# Patient Record
Sex: Female | Born: 1935 | Race: White | Hispanic: No | Smoking: Never smoker
Health system: Southern US, Community
[De-identification: ages and names within clinical notes are randomized; demographics above are authoritative.]

## PROBLEM LIST (undated history)

## (undated) DIAGNOSIS — I10 Essential (primary) hypertension: Secondary | ICD-10-CM

## (undated) DIAGNOSIS — K642 Third degree hemorrhoids: Secondary | ICD-10-CM

## (undated) DIAGNOSIS — E785 Hyperlipidemia, unspecified: Secondary | ICD-10-CM

## (undated) DIAGNOSIS — Z9989 Dependence on other enabling machines and devices: Secondary | ICD-10-CM

## (undated) DIAGNOSIS — R600 Localized edema: Secondary | ICD-10-CM

## (undated) DIAGNOSIS — Z8719 Personal history of other diseases of the digestive system: Secondary | ICD-10-CM

## (undated) DIAGNOSIS — M797 Fibromyalgia: Secondary | ICD-10-CM

## (undated) DIAGNOSIS — Z853 Personal history of malignant neoplasm of breast: Secondary | ICD-10-CM

## (undated) DIAGNOSIS — Z9889 Other specified postprocedural states: Secondary | ICD-10-CM

## (undated) DIAGNOSIS — F0391 Unspecified dementia with behavioral disturbance: Secondary | ICD-10-CM

## (undated) DIAGNOSIS — K602 Anal fissure, unspecified: Secondary | ICD-10-CM

## (undated) DIAGNOSIS — F09 Unspecified mental disorder due to known physiological condition: Secondary | ICD-10-CM

## (undated) DIAGNOSIS — Z923 Personal history of irradiation: Secondary | ICD-10-CM

## (undated) DIAGNOSIS — G4733 Obstructive sleep apnea (adult) (pediatric): Secondary | ICD-10-CM

## (undated) DIAGNOSIS — Z859 Personal history of malignant neoplasm, unspecified: Secondary | ICD-10-CM

## (undated) DIAGNOSIS — F03918 Unspecified dementia, unspecified severity, with other behavioral disturbance: Secondary | ICD-10-CM

## (undated) DIAGNOSIS — M1990 Unspecified osteoarthritis, unspecified site: Secondary | ICD-10-CM

## (undated) DIAGNOSIS — I8390 Asymptomatic varicose veins of unspecified lower extremity: Secondary | ICD-10-CM

## (undated) DIAGNOSIS — K219 Gastro-esophageal reflux disease without esophagitis: Secondary | ICD-10-CM

## (undated) HISTORY — PX: RECTOCELE REPAIR: SHX761

## (undated) HISTORY — PX: ESOPHAGEAL DILATION: SHX303

## (undated) HISTORY — DX: Fibromyalgia: M79.7

## (undated) HISTORY — PX: HEMORRHOID BANDING: SHX5850

## (undated) HISTORY — DX: Unspecified osteoarthritis, unspecified site: M19.90

## (undated) HISTORY — DX: Anal fissure, unspecified: K60.2

## (undated) HISTORY — PX: COLONOSCOPY: SHX5424

## (undated) HISTORY — DX: Essential (primary) hypertension: I10

## (undated) HISTORY — DX: Hyperlipidemia, unspecified: E78.5

---

## 1974-06-07 HISTORY — PX: TOTAL ABDOMINAL HYSTERECTOMY: SHX209

## 1998-04-08 ENCOUNTER — Emergency Department (HOSPITAL_COMMUNITY): Admission: EM | Admit: 1998-04-08 | Discharge: 1998-04-08 | Payer: Self-pay | Admitting: Emergency Medicine

## 1998-06-25 ENCOUNTER — Ambulatory Visit (HOSPITAL_COMMUNITY): Admission: RE | Admit: 1998-06-25 | Discharge: 1998-06-25 | Payer: Self-pay | Admitting: *Deleted

## 1998-06-25 ENCOUNTER — Encounter: Payer: Self-pay | Admitting: *Deleted

## 1998-07-23 ENCOUNTER — Other Ambulatory Visit: Admission: RE | Admit: 1998-07-23 | Discharge: 1998-07-23 | Payer: Self-pay | Admitting: *Deleted

## 1998-08-14 ENCOUNTER — Ambulatory Visit (HOSPITAL_COMMUNITY): Admission: RE | Admit: 1998-08-14 | Discharge: 1998-08-14 | Payer: Self-pay | Admitting: Gastroenterology

## 1998-11-24 ENCOUNTER — Emergency Department (HOSPITAL_COMMUNITY): Admission: EM | Admit: 1998-11-24 | Discharge: 1998-11-24 | Payer: Self-pay | Admitting: Emergency Medicine

## 1998-11-24 ENCOUNTER — Encounter: Payer: Self-pay | Admitting: Emergency Medicine

## 1999-07-21 ENCOUNTER — Encounter: Payer: Self-pay | Admitting: *Deleted

## 1999-07-21 ENCOUNTER — Ambulatory Visit (HOSPITAL_COMMUNITY): Admission: RE | Admit: 1999-07-21 | Discharge: 1999-07-21 | Payer: Self-pay | Admitting: *Deleted

## 1999-08-25 ENCOUNTER — Other Ambulatory Visit: Admission: RE | Admit: 1999-08-25 | Discharge: 1999-08-25 | Payer: Self-pay | Admitting: *Deleted

## 2000-01-01 ENCOUNTER — Other Ambulatory Visit: Admission: RE | Admit: 2000-01-01 | Discharge: 2000-01-01 | Payer: Self-pay | Admitting: *Deleted

## 2000-05-16 ENCOUNTER — Other Ambulatory Visit: Admission: RE | Admit: 2000-05-16 | Discharge: 2000-05-16 | Payer: Self-pay | Admitting: *Deleted

## 2000-09-15 ENCOUNTER — Encounter: Payer: Self-pay | Admitting: *Deleted

## 2000-09-15 ENCOUNTER — Ambulatory Visit (HOSPITAL_COMMUNITY): Admission: RE | Admit: 2000-09-15 | Discharge: 2000-09-15 | Payer: Self-pay | Admitting: *Deleted

## 2000-11-30 ENCOUNTER — Ambulatory Visit (HOSPITAL_BASED_OUTPATIENT_CLINIC_OR_DEPARTMENT_OTHER): Admission: RE | Admit: 2000-11-30 | Discharge: 2000-11-30 | Payer: Self-pay | Admitting: Internal Medicine

## 2001-01-04 ENCOUNTER — Ambulatory Visit (HOSPITAL_BASED_OUTPATIENT_CLINIC_OR_DEPARTMENT_OTHER): Admission: RE | Admit: 2001-01-04 | Discharge: 2001-01-04 | Payer: Self-pay | Admitting: Internal Medicine

## 2001-02-08 ENCOUNTER — Other Ambulatory Visit: Admission: RE | Admit: 2001-02-08 | Discharge: 2001-02-08 | Payer: Self-pay | Admitting: *Deleted

## 2001-03-12 ENCOUNTER — Emergency Department (HOSPITAL_COMMUNITY): Admission: EM | Admit: 2001-03-12 | Discharge: 2001-03-12 | Payer: Self-pay | Admitting: Emergency Medicine

## 2001-12-18 ENCOUNTER — Ambulatory Visit (HOSPITAL_COMMUNITY): Admission: RE | Admit: 2001-12-18 | Discharge: 2001-12-18 | Payer: Self-pay | Admitting: *Deleted

## 2001-12-18 ENCOUNTER — Encounter: Payer: Self-pay | Admitting: *Deleted

## 2002-12-25 ENCOUNTER — Ambulatory Visit (HOSPITAL_COMMUNITY): Admission: RE | Admit: 2002-12-25 | Discharge: 2002-12-25 | Payer: Self-pay | Admitting: *Deleted

## 2002-12-25 ENCOUNTER — Encounter: Payer: Self-pay | Admitting: *Deleted

## 2003-04-16 ENCOUNTER — Other Ambulatory Visit: Admission: RE | Admit: 2003-04-16 | Discharge: 2003-04-16 | Payer: Self-pay | Admitting: *Deleted

## 2003-05-22 ENCOUNTER — Ambulatory Visit (HOSPITAL_COMMUNITY): Admission: RE | Admit: 2003-05-22 | Discharge: 2003-05-22 | Payer: Self-pay | Admitting: *Deleted

## 2004-01-19 ENCOUNTER — Emergency Department (HOSPITAL_COMMUNITY): Admission: EM | Admit: 2004-01-19 | Discharge: 2004-01-19 | Payer: Self-pay

## 2004-01-21 ENCOUNTER — Ambulatory Visit (HOSPITAL_COMMUNITY): Admission: RE | Admit: 2004-01-21 | Discharge: 2004-01-21 | Payer: Self-pay | Admitting: Internal Medicine

## 2004-02-07 ENCOUNTER — Ambulatory Visit (HOSPITAL_COMMUNITY): Admission: RE | Admit: 2004-02-07 | Discharge: 2004-02-07 | Payer: Self-pay | Admitting: *Deleted

## 2004-07-24 ENCOUNTER — Ambulatory Visit: Payer: Self-pay | Admitting: Ophthalmology

## 2004-08-03 ENCOUNTER — Ambulatory Visit: Payer: Self-pay | Admitting: Ophthalmology

## 2005-02-11 ENCOUNTER — Ambulatory Visit (HOSPITAL_COMMUNITY): Admission: RE | Admit: 2005-02-11 | Discharge: 2005-02-11 | Payer: Self-pay | Admitting: *Deleted

## 2005-02-23 ENCOUNTER — Encounter: Admission: RE | Admit: 2005-02-23 | Discharge: 2005-02-23 | Payer: Self-pay | Admitting: Internal Medicine

## 2005-06-07 HISTORY — PX: CHOLECYSTECTOMY: SHX55

## 2006-03-03 ENCOUNTER — Ambulatory Visit (HOSPITAL_COMMUNITY): Admission: RE | Admit: 2006-03-03 | Discharge: 2006-03-03 | Payer: Self-pay | Admitting: *Deleted

## 2006-06-03 ENCOUNTER — Emergency Department (HOSPITAL_COMMUNITY): Admission: EM | Admit: 2006-06-03 | Discharge: 2006-06-03 | Payer: Self-pay | Admitting: Emergency Medicine

## 2006-07-28 ENCOUNTER — Ambulatory Visit: Admission: RE | Admit: 2006-07-28 | Discharge: 2006-07-28 | Payer: Self-pay | Admitting: Internal Medicine

## 2007-03-06 ENCOUNTER — Ambulatory Visit (HOSPITAL_COMMUNITY): Admission: RE | Admit: 2007-03-06 | Discharge: 2007-03-06 | Payer: Self-pay | Admitting: Internal Medicine

## 2007-05-01 ENCOUNTER — Encounter: Admission: RE | Admit: 2007-05-01 | Discharge: 2007-05-01 | Payer: Self-pay | Admitting: Internal Medicine

## 2007-07-06 ENCOUNTER — Encounter (INDEPENDENT_AMBULATORY_CARE_PROVIDER_SITE_OTHER): Payer: Self-pay | Admitting: Interventional Radiology

## 2007-07-06 ENCOUNTER — Encounter: Admission: RE | Admit: 2007-07-06 | Discharge: 2007-07-06 | Payer: Self-pay | Admitting: Internal Medicine

## 2007-07-06 ENCOUNTER — Other Ambulatory Visit: Admission: RE | Admit: 2007-07-06 | Discharge: 2007-07-06 | Payer: Self-pay | Admitting: Interventional Radiology

## 2007-12-27 ENCOUNTER — Encounter: Admission: RE | Admit: 2007-12-27 | Discharge: 2007-12-27 | Payer: Self-pay | Admitting: Internal Medicine

## 2008-03-13 ENCOUNTER — Ambulatory Visit (HOSPITAL_COMMUNITY): Admission: RE | Admit: 2008-03-13 | Discharge: 2008-03-13 | Payer: Self-pay | Admitting: Internal Medicine

## 2008-04-17 ENCOUNTER — Encounter: Admission: RE | Admit: 2008-04-17 | Discharge: 2008-04-17 | Payer: Self-pay | Admitting: Interventional Radiology

## 2008-04-24 ENCOUNTER — Encounter: Admission: RE | Admit: 2008-04-24 | Discharge: 2008-04-24 | Payer: Self-pay | Admitting: Interventional Radiology

## 2008-05-07 ENCOUNTER — Encounter: Admission: RE | Admit: 2008-05-07 | Discharge: 2008-05-07 | Payer: Self-pay | Admitting: Internal Medicine

## 2008-05-14 ENCOUNTER — Encounter: Admission: RE | Admit: 2008-05-14 | Discharge: 2008-05-14 | Payer: Self-pay | Admitting: Internal Medicine

## 2008-06-07 HISTORY — PX: VARICOSE VEIN SURGERY: SHX832

## 2008-09-20 ENCOUNTER — Ambulatory Visit (HOSPITAL_COMMUNITY): Admission: RE | Admit: 2008-09-20 | Discharge: 2008-09-20 | Payer: Self-pay | Admitting: *Deleted

## 2008-10-22 ENCOUNTER — Encounter: Admission: RE | Admit: 2008-10-22 | Discharge: 2008-10-22 | Payer: Self-pay | Admitting: Interventional Radiology

## 2009-03-14 ENCOUNTER — Ambulatory Visit (HOSPITAL_COMMUNITY): Admission: RE | Admit: 2009-03-14 | Discharge: 2009-03-14 | Payer: Self-pay | Admitting: Neurology

## 2009-04-04 ENCOUNTER — Encounter: Admission: RE | Admit: 2009-04-04 | Discharge: 2009-04-04 | Payer: Self-pay | Admitting: Internal Medicine

## 2009-05-09 ENCOUNTER — Ambulatory Visit (HOSPITAL_COMMUNITY): Admission: RE | Admit: 2009-05-09 | Discharge: 2009-05-10 | Payer: Self-pay | Admitting: Surgery

## 2009-05-09 ENCOUNTER — Encounter (INDEPENDENT_AMBULATORY_CARE_PROVIDER_SITE_OTHER): Payer: Self-pay | Admitting: Surgery

## 2010-01-26 ENCOUNTER — Emergency Department (HOSPITAL_COMMUNITY): Admission: EM | Admit: 2010-01-26 | Discharge: 2010-01-27 | Payer: Self-pay | Admitting: Emergency Medicine

## 2010-03-18 ENCOUNTER — Ambulatory Visit (HOSPITAL_COMMUNITY): Admission: RE | Admit: 2010-03-18 | Discharge: 2010-03-18 | Payer: Self-pay | Admitting: Internal Medicine

## 2010-03-30 ENCOUNTER — Ambulatory Visit: Payer: Self-pay | Admitting: Cardiology

## 2010-06-27 ENCOUNTER — Encounter: Payer: Self-pay | Admitting: Orthopedic Surgery

## 2010-06-28 ENCOUNTER — Encounter: Payer: Self-pay | Admitting: Endocrinology

## 2010-06-28 ENCOUNTER — Encounter: Payer: Self-pay | Admitting: Orthopedic Surgery

## 2010-06-28 ENCOUNTER — Encounter: Payer: Self-pay | Admitting: Internal Medicine

## 2010-07-14 ENCOUNTER — Encounter (INDEPENDENT_AMBULATORY_CARE_PROVIDER_SITE_OTHER): Payer: Self-pay | Admitting: *Deleted

## 2010-07-23 NOTE — Letter (Signed)
Summary: New Patient letter  [facility]  [address] [place], [place] [address]   Phone: [phone]  Fax: [phone]       07/14/2010 MRN: [id number]  [facility] [address] [place], [place]  [address]  Dear [name],  Welcome to the Gastroenterology Division at [facility].    You are scheduled to see Dr.  Christella Hartigan on 08-21-10 at 11:00A.M. on the 3rd floor at [facility], [address]  We ask that you try to arrive at our office 15 minutes prior to your appointment time to allow for check-in.  We would like you to complete the enclosed self-administered evaluation form prior to your visit and bring it with you on the day of your appointment.  We will review it with you.  Also, please bring a complete list of all your medications or, if you prefer, bring the medication bottles and we will list them.  Please bring your insurance card so that we may make a copy of it.  If your insurance requires a referral to see a specialist, please bring your referral form from your primary care physician.  Co-payments are due at the time of your visit and may be paid by cash, check or credit card.     Your office visit will consist of a consult with your physician (includes a physical exam), any laboratory testing he/she may order, scheduling of any necessary diagnostic testing (e.g. x-ray, ultrasound, CT-scan), and scheduling of a procedure (e.g. Endoscopy, Colonoscopy) if required.  Please allow enough time on your schedule to allow for any/all of these possibilities.    If you cannot keep your appointment, please call [phone] to cancel or reschedule prior to your appointment date.  This allows [place] the opportunity to schedule an appointment for another patient in need of care.  If you do not cancel or reschedule by 5 p.m. the business day prior to your appointment date, you will be charged a $50.00 late cancellation/no-show fee.    Thank you for  choosing [facility] for your medical needs.  We appreciate the opportunity to care for you.  Please visit [place] at our website  to learn more about our practice.                     Sincerely,                                                             The Gastroenterology Division

## 2010-07-29 ENCOUNTER — Other Ambulatory Visit: Payer: Self-pay | Admitting: Gastroenterology

## 2010-07-30 ENCOUNTER — Ambulatory Visit
Admission: RE | Admit: 2010-07-30 | Discharge: 2010-07-30 | Disposition: A | Discharge status: Home / Self Care | Payer: Medicare Other | Source: Ambulatory Visit | Attending: Gastroenterology | Admitting: Gastroenterology

## 2010-07-30 MED ORDER — IOHEXOL 300 MG/ML  SOLN
100.0000 mL | Freq: Once | INTRAMUSCULAR | Status: AC | PRN
  Administered 2010-07-30: 100 mL via INTRAVENOUS

## 2010-08-13 ENCOUNTER — Ambulatory Visit: Payer: Self-pay | Admitting: Gastroenterology

## 2010-08-21 ENCOUNTER — Ambulatory Visit: Payer: Self-pay | Admitting: Gastroenterology

## 2010-09-08 LAB — COMPREHENSIVE METABOLIC PANEL
ALT: 15 U/L (ref 0–35)
AST: 18 U/L (ref 0–37)
Albumin: 3.6 g/dL (ref 3.5–5.2)
Calcium: 9.8 mg/dL (ref 8.4–10.5)
GFR calc Af Amer: >60 mL/min (ref >60)
Potassium: 4.6 mEq/L (ref 3.5–5.1)
Sodium: 142 mEq/L (ref 135–145)
Total Protein: 7 g/dL (ref 6.0–8.3)

## 2010-09-08 LAB — DIFFERENTIAL
Eosinophils Absolute: 0 10*3/uL (ref 0.0–0.7)
Eosinophils Relative: 1 % (ref 0–5)
Lymphs Abs: 2.1 10*3/uL (ref 0.7–4.0)
Monocytes Absolute: 0.5 10*3/uL (ref 0.1–1.0)
Monocytes Relative: 7 % (ref 3–12)

## 2010-09-08 LAB — CBC
MCHC: 33.9 g/dL (ref 30.0–36.0)
RBC: 4.32 MIL/uL (ref 3.87–5.11)
RDW: 12.8 % (ref 11.5–15.5)

## 2010-09-29 ENCOUNTER — Encounter: Payer: Self-pay | Admitting: *Deleted

## 2010-09-29 DIAGNOSIS — E7800 Pure hypercholesterolemia, unspecified: Secondary | ICD-10-CM

## 2010-09-29 DIAGNOSIS — I10 Essential (primary) hypertension: Secondary | ICD-10-CM

## 2010-09-29 DIAGNOSIS — Z87898 Personal history of other specified conditions: Secondary | ICD-10-CM | POA: Insufficient documentation

## 2010-09-29 DIAGNOSIS — K220 Achalasia of cardia: Secondary | ICD-10-CM | POA: Insufficient documentation

## 2010-09-29 DIAGNOSIS — E785 Hyperlipidemia, unspecified: Secondary | ICD-10-CM

## 2010-09-29 DIAGNOSIS — M797 Fibromyalgia: Secondary | ICD-10-CM | POA: Insufficient documentation

## 2010-10-01 ENCOUNTER — Encounter: Payer: Self-pay | Admitting: Cardiology

## 2010-10-01 ENCOUNTER — Ambulatory Visit (INDEPENDENT_AMBULATORY_CARE_PROVIDER_SITE_OTHER): Payer: Medicare Other | Admitting: Cardiology

## 2010-10-01 DIAGNOSIS — I10 Essential (primary) hypertension: Secondary | ICD-10-CM

## 2010-10-01 DIAGNOSIS — E7800 Pure hypercholesterolemia, unspecified: Secondary | ICD-10-CM

## 2010-10-01 NOTE — Assessment & Plan Note (Signed)
Blood pressure control is acceptable. We'll continue current therapy. I recommended a low-sodium diet.

## 2010-10-01 NOTE — Patient Instructions (Signed)
Continue current medications.  We will schedule a follow up visit in 6 months.

## 2010-10-01 NOTE — Assessment & Plan Note (Signed)
She is intolerant to multiple lowering drugs. Her lab work is followed by Dr. Renne Crigler.

## 2010-10-01 NOTE — Progress Notes (Signed)
   Kimberly Acevedo Date of Birth: 1929/05/31   History of Present Illness: Kimberly Acevedo is seen for followup of hypertension today. She has been doing very well from a cardiac standpoint. She has had a recent severe bronchitis and has been treated with cough medicine and antibiotics. She has a persistent cough that is mostly nonproductive. She denies any fever or chills.  Current Outpatient Prescriptions on File Prior to Visit  Medication Sig Dispense Refill  . acetaminophen (TYLENOL) 325 MG tablet Take 650 mg by mouth as needed.        . calcium citrate-vitamin D (CITRACAL+D) 315-200 MG-UNIT per tablet Take 1 tablet by mouth 2 (two) times daily.        . Cholecalciferol (VITAMIN D PO) Take 1 tablet by mouth daily.        Marland Kitchen diltiazem (DILACOR XR) 240 MG 24 hr capsule Take 240 mg by mouth daily.        . DiphenhydrAMINE HCl (ALLERGY MED PO) Take 1 tablet by mouth daily.        Marland Kitchen GINKGO BILOBA PO Take 1 capsule by mouth 2 (two) times daily.       Marland Kitchen losartan-hydrochlorothiazide (HYZAAR) 100-25 MG per tablet Take 1 tablet by mouth daily.        . multivitamin (THERAGRAN) per tablet Take 1 tablet by mouth daily. Taking ocuvite      . niacin 500 MG tablet Take 500 mg by mouth daily.        . Omega-3 Fatty Acids (FISH OIL) 1200 MG CAPS Take 2 capsules by mouth 2 (two) times daily.        . sertraline (ZOLOFT) 100 MG tablet Take 100 mg by mouth at bedtime.        . vitamin B-12 (CYANOCOBALAMIN) 250 MCG tablet Take 1,000 mcg by mouth daily.       Marland Kitchen GLUCOSAMINE-CHONDROITIN PO Take 1 tablet by mouth 2 (two) times daily.          Allergies  Allergen Reactions  . Aspirin   . Codeine   . Penicillins   . Sulfa Drugs Cross Reactors     Past Medical History  Diagnosis Date  . Fibromyalgia   . Dyslipidemia   . History of chest pain   . Achalasia, esophageal     history of  . History of dizziness   . History of fatigue     Past Surgical History  Procedure Date  . Cholecystectomy   .  Rectocele repair   . Total abdominal hysterectomy   . Esophageal dilation     History  Smoking status  . Never Smoker   Smokeless tobacco  . Not on file    History  Alcohol Use No    History reviewed. No pertinent family history.  Review of Systems: The review of systems is positive for recent bronchitis and sinus congestion.  All other systems were reviewed and are negative.  Physical Exam: BP 140/70  Pulse 78  Wt 159 lb (72.122 kg) She is a pleasant elderly white female in no acute distress. HEENT exam is unremarkable. She has no JVD or bruits. Lungs reveal scattered rhonchi. Cardiac exam reveals a regular rate and rhythm without gallop or murmur. Abdomen is soft and nontender. She has no edema. Pedal pulses are good. LABORATORY DATA:   Assessment / Plan:

## 2010-10-20 NOTE — Op Note (Signed)
NAMEKADRA, KOHAN NO.:  [id number]   MEDICAL RECORD NO.:  [id number]          PATIENT TYPE:  AMB   LOCATION:  ENDO                         FACILITY:  [facility]   PHYSICIAN:  Georgiana Spinner, M.D.    DATE OF BIRTH:  [birth year]   DATE OF PROCEDURE:  DATE OF DISCHARGE:                               OPERATIVE REPORT   PROCEDURE:  Colonoscopy.   INDICATIONS:  History of colon polyps.   ANESTHESIA:  Fentanyl 50 mcg, Versed 5 mg.   PROCEDURE:  With the patient mildly sedated in the left lateral  decubitus position, the Pentax videoscopic pediatric colonoscope was  inserted in the rectum and passed under direct vision to the cecum  identified by ileocecal valve and appendiceal orifice, both of which  were photographed.  From this point, the colonoscope was slowly  withdrawn taking circumferential views of colonic mucosa stopping in the  rectum which appeared normal on direct; it showed hemorrhoids on  retroflexed view.  The endoscope was straightened and withdrawn.  The  patient's vital signs and pulse oximeter remained stable.  The patient  tolerated the procedure well without apparent complications.   FINDINGS:  Internal hemorrhoids and of note she actually had two large  flat AVMs in the cecum which I photographed only.  Also of note, there  were diverticula scattered throughout the colon, but interestingly the  biggest and deepest ones were in the transverse colon.   PLAN:  Repeat examination in 5 years.           ______________________________  Georgiana Spinner, M.D.     GMO/MEDQ  D:  09/20/2008  T:  09/20/2008  Job:  [id number]

## 2010-10-23 NOTE — Op Note (Signed)
Kimberly Acevedo, Kimberly Acevedo                         ACCOUNT NO.:  [id number]   MEDICAL RECORD NO.:  [id number]                   PATIENT TYPE:  AMB   LOCATION:  ENDO                                 [facility]ACILITY[facility]  [facility]   PHYSICIAN:  Georgiana Spinner, M.D.                 DATE OF BIRTH:  [birth year]   DATE OF PROCEDURE:  DATE OF DISCHARGE:                                 OPERATIVE REPORT   PROCEDURE:  Colonoscopy.   INDICATIONS FOR PROCEDURE:  Colon polyps.   ANESTHESIA:  Fentanyl 50 mcg, Versed 4 mg.   DESCRIPTION OF PROCEDURE:  With the patient mildly sedated in the left  lateral decubitus position, the Olympus videoscopic colonoscope was inserted  in the rectum and passed under direct vision to the cecum identified by the  ileocecal valve and appendiceal orifice both of which were photographed.  From this point, the colonoscope was slowly withdrawn taking circumferential  views of the colonic mucosa as we withdrew all the way to the rectum  stopping only to photograph diverticula seen along the way throughout the  colon more so in the sigmoid colon but also seen in the right colon until we  reached the rectum which appeared normal in direct and in retroflexed view.  The endoscope was straightened and withdrawn. The patient's vital signs and  pulse oximeter remained stable. The patient tolerated the procedure well  without apparent complications.   FINDINGS:  Internal hemorrhoids were noted.  Diverticulosis throughout the  colon more so in the left colon than the right but otherwise an unremarkable  colonoscopic examination to the cecum.   PLAN:  Repeat examination possibly in five years.                                               Georgiana Spinner, M.D.    GMO/MEDQ  D:  05/22/2003  T:  05/22/2003  Job:  [id number]

## 2010-12-22 ENCOUNTER — Ambulatory Visit (INDEPENDENT_AMBULATORY_CARE_PROVIDER_SITE_OTHER): Payer: Medicare Other | Admitting: General Surgery

## 2010-12-22 ENCOUNTER — Encounter (INDEPENDENT_AMBULATORY_CARE_PROVIDER_SITE_OTHER): Payer: Self-pay | Admitting: General Surgery

## 2010-12-22 VITALS — BP 150/80 | HR 71 | Temp 98.5°F | Ht 68.0 in | Wt 161.8 lb

## 2010-12-22 DIAGNOSIS — K648 Other hemorrhoids: Secondary | ICD-10-CM

## 2010-12-22 NOTE — Progress Notes (Signed)
Kimberly Acevedo is a 77 y.o. female.    Chief Complaint  Patient presents with  . Other    eval symptomatic hems    HPI HPI This patient is referred for evaluation of symptomatic hemorrhoids. She has been treated as an outpatient for diverticulitis by Dr. Dulce Sellar which she states resolved without admission. In the evaluation for left lower quadrant pain she mentioned that she feels a bulge in the inguinal region and going to the bathroom and straining. She states that this normally retracts spontaneously but she can also reduce it manually. She has associated tenesmus. She also states that she has bouts of bright blood on the tissue but denies any melena. She had a colonoscopy approximately 2 years ago which demonstrated hemorrhoids and diverticulosis. She denies any weight loss.    Past Medical History  Diagnosis Date  . Fibromyalgia   . Dyslipidemia   . History of chest pain   . Achalasia, esophageal     history of  . History of dizziness   . History of fatigue   . Hypertension   . Hemorrhoids   . Arthritis   Angina-CAD  Past Surgical History  Procedure Date  . Cholecystectomy   . Rectocele repair   . Total abdominal hysterectomy   . Esophageal dilation     Family History  Problem Relation Age of Onset  . Cancer Mother     Social History History  Substance Use Topics  . Smoking status: Never Smoker   . Smokeless tobacco: Not on file  . Alcohol Use: No    Allergies  Allergen Reactions  . Aspirin   . Codeine   . Penicillins   . Sulfa Drugs Cross Reactors     Current Outpatient Prescriptions  Medication Sig Dispense Refill  . ASMANEX 60 METERED DOSES 220 MCG/INH inhaler       . PROCTOSOL HC 2.5 % rectal cream       . PROVENTIL HFA 108 (90 BASE) MCG/ACT inhaler       . zaleplon (SONATA) 10 MG capsule       . acetaminophen (TYLENOL) 325 MG tablet Take 650 mg by mouth as needed.        . calcium citrate-vitamin D (CITRACAL+D) 315-200 MG-UNIT per tablet Take 1  tablet by mouth 2 (two) times daily.        . Cholecalciferol (VITAMIN D PO) Take 1 tablet by mouth daily.        Marland Kitchen diltiazem (DILACOR XR) 240 MG 24 hr capsule Take 240 mg by mouth daily.        . DiphenhydrAMINE HCl (ALLERGY MED PO) Take 1 tablet by mouth daily.        Marland Kitchen GINKGO BILOBA PO Take 1 capsule by mouth 2 (two) times daily.       Marland Kitchen GLUCOSAMINE-CHONDROITIN PO Take 1 tablet by mouth 2 (two) times daily.        Marland Kitchen losartan-hydrochlorothiazide (HYZAAR) 100-25 MG per tablet Take 1 tablet by mouth daily.        . multivitamin (THERAGRAN) per tablet Take 1 tablet by mouth daily. Taking ocuvite      . niacin 500 MG tablet Take 500 mg by mouth daily.        . Omega-3 Fatty Acids (FISH OIL) 1200 MG CAPS Take 2 capsules by mouth 2 (two) times daily.        . sertraline (ZOLOFT) 100 MG tablet Take 100 mg by mouth at bedtime.        Marland Kitchen  vitamin B-12 (CYANOCOBALAMIN) 250 MCG tablet Take 1,000 mcg by mouth daily.         Review of Systems Review of Systems  Constitutional: Negative.   HENT: Negative.   Eyes: Negative.   Respiratory: Negative.   Cardiovascular: Negative.   Gastrointestinal: Positive for blood in stool. Negative for abdominal pain, constipation and melena.  Genitourinary: Negative.   Musculoskeletal: Positive for joint pain.  Skin: Negative.   Neurological: Negative.   Endo/Heme/Allergies: Negative.   Psychiatric/Behavioral: Negative.   All other systems reviewed and are negative.    Physical Exam Physical Exam   Blood pressure 150/80, pulse 71, temperature 98.5 F (36.9 C), height 5\' 8"  (1.727 m), weight 161 lb 12.8 oz (73.392 kg).  Assessment/Plan Hemorrhoids Discussed conservative management vs. Surgical treatment. She would like to continue conservative management for now.  I reviewed dietary changes necessary for good bowel hygiene including increasing fiber to 20-30gms/day with increased water intake.  He symptoms have improved lately and with her social situation  (her husband is in a nursing home) she is not sure that she could be down for the required time to recover from a procedure.  Since her symptoms have improved, I think that this is reasonable.  She will follow up with me in 4-6 weeks if her symptoms do not continue to improve.  If she desires at that time, we will revisit surgical treatments.  Lodema Pilot DAVID 12/22/2010, 3:46 PM

## 2011-01-28 ENCOUNTER — Other Ambulatory Visit: Payer: Self-pay | Admitting: Endocrinology

## 2011-01-28 DIAGNOSIS — E049 Nontoxic goiter, unspecified: Secondary | ICD-10-CM

## 2011-02-05 ENCOUNTER — Other Ambulatory Visit (HOSPITAL_COMMUNITY): Payer: Self-pay | Admitting: Internal Medicine

## 2011-02-05 DIAGNOSIS — Z1231 Encounter for screening mammogram for malignant neoplasm of breast: Secondary | ICD-10-CM

## 2011-02-10 ENCOUNTER — Ambulatory Visit
Admission: RE | Admit: 2011-02-10 | Discharge: 2011-02-10 | Disposition: A | Discharge status: Home / Self Care | Payer: Medicare Other | Source: Ambulatory Visit | Attending: Endocrinology | Admitting: Endocrinology

## 2011-02-10 DIAGNOSIS — E049 Nontoxic goiter, unspecified: Secondary | ICD-10-CM

## 2011-03-22 ENCOUNTER — Ambulatory Visit (HOSPITAL_COMMUNITY)
Admission: RE | Admit: 2011-03-22 | Discharge: 2011-03-22 | Disposition: A | Discharge status: Home / Self Care | Payer: Medicare Other | Source: Ambulatory Visit | Attending: Internal Medicine | Admitting: Internal Medicine

## 2011-03-22 ENCOUNTER — Ambulatory Visit (HOSPITAL_COMMUNITY): Payer: Medicare Other

## 2011-03-22 DIAGNOSIS — Z1231 Encounter for screening mammogram for malignant neoplasm of breast: Secondary | ICD-10-CM | POA: Insufficient documentation

## 2011-04-19 ENCOUNTER — Ambulatory Visit: Payer: Medicare Other | Admitting: Cardiology

## 2011-05-03 ENCOUNTER — Ambulatory Visit (INDEPENDENT_AMBULATORY_CARE_PROVIDER_SITE_OTHER): Payer: Medicare Other | Admitting: Cardiology

## 2011-05-03 ENCOUNTER — Encounter: Payer: Self-pay | Admitting: Cardiology

## 2011-05-03 VITALS — BP 130/65 | HR 74 | Ht 67.0 in | Wt 162.4 lb

## 2011-05-03 DIAGNOSIS — E7800 Pure hypercholesterolemia, unspecified: Secondary | ICD-10-CM

## 2011-05-03 DIAGNOSIS — E785 Hyperlipidemia, unspecified: Secondary | ICD-10-CM

## 2011-05-03 DIAGNOSIS — I10 Essential (primary) hypertension: Secondary | ICD-10-CM

## 2011-05-03 NOTE — Assessment & Plan Note (Signed)
I am pleased that she was able to tolerate 5 mg of Crestor daily. I think if she is willing to take this I would recommend she continue with the lower dose.

## 2011-05-03 NOTE — Progress Notes (Signed)
Kimberly Acevedo Date of Birth: [birth year]   History of Present Illness: Kimberly Acevedo is seen for followup of hypertension today. She has been doing very well from a cardiac standpoint. Her blood pressure has been under good control. Since her last visit she has been diagnosis significant arthritis in her spine. She also had a bout of diverticulitis. She was placed on Crestor 5 mg daily and tolerated this well. There appears to be a mixup and she was subsequently prescribed 40 mg daily. She is unwilling to take this dose. She has been intolerant of multiple other lipid-lowering drugs in the past.  Current Outpatient Prescriptions on File Prior to Visit  Medication Sig Dispense Refill  . acetaminophen (TYLENOL) 325 MG tablet Take 650 mg by mouth as needed.        . calcium citrate-vitamin D (CITRACAL+D) 315-200 MG-UNIT per tablet Take 1 tablet by mouth 2 (two) times daily.        . Cholecalciferol (VITAMIN D PO) Take 1 tablet by mouth daily.        Marland Kitchen diltiazem (DILACOR XR) 240 MG 24 hr capsule Take 240 mg by mouth daily.        . DiphenhydrAMINE HCl (ALLERGY MED PO) Take 1 tablet by mouth daily.        Marland Kitchen GINKGO BILOBA PO Take 1 capsule by mouth 2 (two) times daily.       Marland Kitchen losartan-hydrochlorothiazide (HYZAAR) 100-25 MG per tablet Take 1 tablet by mouth daily.        . multivitamin (THERAGRAN) per tablet Take 1 tablet by mouth daily. Taking ocuvite      . Omega-3 Fatty Acids (FISH OIL) 1200 MG CAPS Take 2 capsules by mouth 2 (two) times daily.        Marland Kitchen PROCTOSOL HC 2.5 % rectal cream as needed.       . sertraline (ZOLOFT) 100 MG tablet Take 100 mg by mouth at bedtime.       . vitamin B-12 (CYANOCOBALAMIN) 250 MCG tablet Take 1,000 mcg by mouth daily.         Allergies  Allergen Reactions  . Aspirin   . Codeine   . Penicillins   . Sulfa Drugs Cross Reactors   . Tramadol Hcl     Past Medical History  Diagnosis Date  . Fibromyalgia   . Dyslipidemia   . History of chest pain   .  Achalasia, esophageal     history of  . History of dizziness   . History of fatigue   . Hypertension   . Hemorrhoids   . Arthritis   . Diverticulitis     Past Surgical History  Procedure Date  . Cholecystectomy   . Rectocele repair   . Total abdominal hysterectomy   . Esophageal dilation     History  Smoking status  . Never Smoker   Smokeless tobacco  . Not on file    History  Alcohol Use No    Family History  Problem Relation Age of Onset  . Cancer Mother     Review of Systems: As noted in history of present illness.  All other systems were reviewed and are negative.  Physical Exam: BP 130/65  Pulse 74  Ht 5\' 7"  (1.702 m)  Wt 162 lb 6.4 oz (73.664 kg)  BMI 25.44 kg/m2 She is a pleasant elderly white female in no acute distress. HEENT exam is unremarkable. She has no JVD or bruits. Lungs reveal scattered rhonchi. Cardiac  exam reveals a regular rate and rhythm without gallop or murmur. Abdomen is soft and nontender. She has no edema. Pedal pulses are good. LABORATORY DATA:  ECG today demonstrates normal sinus rhythm with a normal ECG. Assessment / Plan:

## 2011-05-03 NOTE — Assessment & Plan Note (Signed)
Blood pressure is under excellent control today. We will continue with her current antihypertensive therapy. 

## 2011-05-03 NOTE — Patient Instructions (Signed)
Continue your current medications  I will see you again in 6 months.   

## 2011-08-03 ENCOUNTER — Emergency Department (INDEPENDENT_AMBULATORY_CARE_PROVIDER_SITE_OTHER)
Admission: EM | Admit: 2011-08-03 | Discharge: 2011-08-03 | Disposition: A | Discharge status: Home / Self Care | Payer: Medicare Other | Source: Home / Self Care | Attending: Emergency Medicine | Admitting: Emergency Medicine

## 2011-08-03 ENCOUNTER — Encounter (HOSPITAL_COMMUNITY): Payer: Self-pay | Admitting: Emergency Medicine

## 2011-08-03 ENCOUNTER — Other Ambulatory Visit: Payer: Self-pay

## 2011-08-03 DIAGNOSIS — K5732 Diverticulitis of large intestine without perforation or abscess without bleeding: Secondary | ICD-10-CM

## 2011-08-03 DIAGNOSIS — K5792 Diverticulitis of intestine, part unspecified, without perforation or abscess without bleeding: Secondary | ICD-10-CM

## 2011-08-03 DIAGNOSIS — N390 Urinary tract infection, site not specified: Secondary | ICD-10-CM

## 2011-08-03 LAB — POCT URINALYSIS DIP (DEVICE)
Nitrite: NEGATIVE
Urobilinogen, UA: 0.2 mg/dL (ref 0.0–1.0)
pH: 7 (ref 5.0–8.0)

## 2011-08-03 MED ORDER — CIPROFLOXACIN HCL 500 MG PO TABS: 500.0000 mg | ORAL_TABLET | Freq: Two times a day (BID) | ORAL | Status: AC

## 2011-08-03 MED ORDER — METRONIDAZOLE 500 MG PO TABS: 500.0000 mg | ORAL_TABLET | Freq: Three times a day (TID) | ORAL | Status: AC

## 2011-08-03 NOTE — Discharge Instructions (Signed)
Take medication as written. Make sure you finish all the antibiotics, even if you feel better. Followup with your Dr. in 2 days. Return immediately to the ER if you have a fever above 100.4, if you stop having bowel movements, or passing gas, if you're pain gets worse, if you have abdominal swelling, or unable to keep any liquids down, or for any other concerns.

## 2011-08-03 NOTE — ED Provider Notes (Signed)
History     CSN: [id number]  Arrival date & time 08/03/11  1701   First MD Initiated Contact with Patient 08/03/11 1709      Chief Complaint  Patient presents with  . Abdominal Pain    (Consider location/radiation/quality/duration/timing/severity/associated sxs/prior treatment) HPI Comments: Patient with 2 weeks of constant, nonradiating left sided and right sided abdominal pain.  Pain is not aggravated with eating, fasting, defecation, urination. Mild relief with Bentyl. No abdominal distention, nausea, vomiting, fevers, anorexia, urinary urgency, frequency, dysuria, hematuria, back pain. No chest pain, coughing, wheezing, shortness of breath. Patient states this pain is identical to previous episodes of diverticulitis. She has seen her primary care physician for this, and was started on Bentyl, with minimal relief.   ROS as noted in HPI. All other ROS negative.   Patient is a 78 y.o. female presenting with abdominal pain. The history is provided by the patient.  Abdominal Pain The primary symptoms of the illness include abdominal pain. The current episode started more than 2 days ago. The onset of the illness was gradual. The problem has not changed since onset. The patient has not had a change in bowel habit. Risk factors for an acute abdominal problem include being elderly. Symptoms associated with the illness do not include chills, anorexia, diaphoresis, heartburn, constipation, urgency, hematuria, frequency or back pain. Significant associated medical issues include diverticulitis.    Past Medical History  Diagnosis Date  . Fibromyalgia   . Dyslipidemia   . History of chest pain   . Achalasia, esophageal     history of  . History of dizziness   . History of fatigue   . Hypertension   . Hemorrhoids   . Arthritis   . Diverticulitis     Past Surgical History  Procedure Date  . Cholecystectomy   . Rectocele repair   . Total abdominal hysterectomy   . Esophageal  dilation     Family History  Problem Relation Age of Onset  . Cancer Mother     History  Substance Use Topics  . Smoking status: Never Smoker   . Smokeless tobacco: Not on file  . Alcohol Use: No    OB History    Grav Para Term Preterm Abortions TAB SAB Ect Mult Living                  Review of Systems  Constitutional: Negative for chills and diaphoresis.  Gastrointestinal: Positive for abdominal pain. Negative for heartburn, constipation and anorexia.  Genitourinary: Negative for urgency, frequency and hematuria.  Musculoskeletal: Negative for back pain.    Allergies  Aspirin; Codeine; Penicillins; Sulfa drugs cross reactors; and Tramadol hcl  Home Medications   Current Outpatient Rx  Name Route Sig Dispense Refill  . DICYCLOMINE HCL 10 MG PO CAPS Oral Take 10 mg by mouth 4 (four) times daily -  before meals and at bedtime.    . ACETAMINOPHEN 325 MG PO TABS Oral Take 650 mg by mouth as needed.      . OCUVITE PO TABS Oral Take 1 tablet by mouth daily.      Marland Kitchen CALCIUM CITRATE-VITAMIN D 315-200 MG-UNIT PO TABS Oral Take 1 tablet by mouth 2 (two) times daily.      Marland Kitchen VITAMIN D PO Oral Take 1 tablet by mouth daily.      Marland Kitchen CIPROFLOXACIN HCL 500 MG PO TABS Oral Take 1 tablet (500 mg total) by mouth 2 (two) times daily. X 10 days 20 tablet 0  .  DILTIAZEM HCL ER 240 MG PO CP24 Oral Take 240 mg by mouth daily.      . ALLERGY MED PO Oral Take 1 tablet by mouth daily.      Marland Kitchen GINKGO BILOBA PO Oral Take 1 capsule by mouth 2 (two) times daily.     Marland Kitchen LOSARTAN POTASSIUM-HCTZ 100-25 MG PO TABS Oral Take 1 tablet by mouth daily.      Marland Kitchen METRONIDAZOLE 500 MG PO TABS Oral Take 1 tablet (500 mg total) by mouth 3 (three) times daily. X 10 days 30 tablet 0  . MULTIVITAMINS PO TABS Oral Take 1 tablet by mouth daily. Taking ocuvite    . FISH OIL 1200 MG PO CAPS Oral Take 2 capsules by mouth 2 (two) times daily.      Marland Kitchen PROCTOSOL HC 2.5 % RE CREA  as needed.     . SERTRALINE HCL 100 MG PO TABS  Oral Take 100 mg by mouth at bedtime.     Marland Kitchen VITAMIN B-12 250 MCG PO TABS Oral Take 1,000 mcg by mouth daily.       BP 161/76  Pulse 66  Temp(Src) 98.1 F (36.7 C) (Oral)  Resp 18  SpO2 96%  Physical Exam  Nursing note and vitals reviewed. Constitutional: She is oriented to person, place, and time. She appears well-developed and well-nourished.  HENT:  Head: Normocephalic and atraumatic.  Eyes: Conjunctivae and EOM are normal.  Neck: Normal range of motion.  Cardiovascular: Normal rate, regular rhythm, normal heart sounds and intact distal pulses.   No murmur heard. Pulmonary/Chest: Effort normal and breath sounds normal. No respiratory distress. She has no wheezes. She has no rales. She exhibits no tenderness.  Abdominal: Soft. Normal appearance and bowel sounds are normal. She exhibits no distension. There is no tenderness. There is no rebound, no guarding and no CVA tenderness.       Rectal: Normal external appearance, normal tone, small amount of stool in vault, Hemoccult negative.  Musculoskeletal: Normal range of motion. She exhibits no edema and no tenderness.  Neurological: She is alert and oriented to person, place, and time.  Skin: Skin is warm and dry.  Psychiatric: She has a normal mood and affect. Her behavior is normal. Judgment and thought content normal.    ED Course  Procedures (including critical care time)  Labs Reviewed  POCT URINALYSIS DIP (DEVICE) - Abnormal; Notable for the following:    Hgb urine dipstick TRACE (*)    Leukocytes, UA SMALL (*) Biochemical Testing Only. Please order routine urinalysis from main lab if confirmatory testing is needed.   All other components within normal limits  OCCULT BLOOD, POC DEVICE  LAB REPORT - SCANNED   No results found.   1. Diverticulitis   2. UTI (lower urinary tract infection)    Results for orders placed during the hospital encounter of 08/03/11  OCCULT BLOOD, POC DEVICE      Component Value Range    Fecal Occult Bld NEGATIVE    POCT URINALYSIS DIP (DEVICE)      Component Value Range   Glucose, UA NEGATIVE  NEGATIVE (mg/dL)   Bilirubin Urine NEGATIVE  NEGATIVE    Ketones, ur NEGATIVE  NEGATIVE (mg/dL)   Specific Gravity, Urine 1.010  1.005 - 1.030    Hgb urine dipstick TRACE (*) NEGATIVE    pH 7.0  5.0 - 8.0    Protein, ur NEGATIVE  NEGATIVE (mg/dL)   Urobilinogen, UA 0.2  0.0 - 1.0 (mg/dL)  Nitrite NEGATIVE  NEGATIVE    Leukocytes, UA SMALL (*) NEGATIVE    EKG: Normal sinus bradycardia, normal axis, normal intervals. No hypertrophy, no ST T-wave changes compared to previous EKG from November 2012.    MDM  Previous records, labs reviewed. Blood pressure on recent cardiology visit 130/65.   Patient couple here, abdomen soft, nontender, normal bowel sounds. Doubt perforation. Patient tolerating by mouth. Patient had normal bowel movement earlier today. Doubt SBO. Patient afebrile, vital signs acceptable. Previous CT February 23,2012, significant for multiple colonic diverticula and a questionable small hiatal hernia, DJD L5-S1. No abdominal aortic aneurysm. Hemoccult negative. Will treat this as a simple diverticulitis, and have her followup with her PMD in 2 days if no improvement. Will give her strict return precautions. Udip noted.  Patient has no urinary complaints. Cipro will cover a UTI. Discussed labs and plan with patient, who agrees.   Luiz Blare, MD 08/05/11 [phone]

## 2011-08-03 NOTE — ED Notes (Signed)
Pt stated, I've had pain all over my stomach for a week, I ve had soreness all over my stomach from having open pockets

## 2011-08-19 ENCOUNTER — Other Ambulatory Visit: Payer: Self-pay | Admitting: Gastroenterology

## 2011-08-20 ENCOUNTER — Ambulatory Visit
Admission: RE | Admit: 2011-08-20 | Discharge: 2011-08-20 | Disposition: A | Discharge status: Home / Self Care | Payer: Medicare Other | Source: Ambulatory Visit | Attending: Gastroenterology | Admitting: Gastroenterology

## 2011-08-20 MED ORDER — IOHEXOL 300 MG/ML  SOLN
30.0000 mL | Freq: Once | INTRAMUSCULAR | Status: AC | PRN
  Administered 2011-08-20: 30 mL via ORAL

## 2011-08-20 MED ORDER — IOHEXOL 300 MG/ML  SOLN
100.0000 mL | Freq: Once | INTRAMUSCULAR | Status: AC | PRN
  Administered 2011-08-20: 100 mL via INTRAVENOUS

## 2011-08-21 ENCOUNTER — Encounter (HOSPITAL_COMMUNITY): Payer: Self-pay | Admitting: *Deleted

## 2011-08-21 ENCOUNTER — Emergency Department (HOSPITAL_COMMUNITY)
Admission: EM | Admit: 2011-08-21 | Discharge: 2011-08-22 | Disposition: A | Discharge status: Home / Self Care | Payer: Medicare Other | Attending: Emergency Medicine | Admitting: Emergency Medicine

## 2011-08-21 DIAGNOSIS — R197 Diarrhea, unspecified: Secondary | ICD-10-CM | POA: Insufficient documentation

## 2011-08-21 DIAGNOSIS — R109 Unspecified abdominal pain: Secondary | ICD-10-CM | POA: Insufficient documentation

## 2011-08-21 DIAGNOSIS — E785 Hyperlipidemia, unspecified: Secondary | ICD-10-CM | POA: Insufficient documentation

## 2011-08-21 DIAGNOSIS — IMO0001 Reserved for inherently not codable concepts without codable children: Secondary | ICD-10-CM | POA: Insufficient documentation

## 2011-08-21 DIAGNOSIS — I10 Essential (primary) hypertension: Secondary | ICD-10-CM | POA: Insufficient documentation

## 2011-08-21 DIAGNOSIS — Z8719 Personal history of other diseases of the digestive system: Secondary | ICD-10-CM | POA: Insufficient documentation

## 2011-08-21 LAB — BASIC METABOLIC PANEL
BUN: 11 mg/dL (ref 6–23)
CO2: 29 mEq/L (ref 19–32)
Calcium: 9.2 mg/dL (ref 8.4–10.5)
Chloride: 96 mEq/L (ref 96–112)
Creatinine, Ser: 0.77 mg/dL (ref 0.50–1.10)
GFR calc Af Amer: 89 mL/min — ABNORMAL LOW (ref >90)
GFR calc non Af Amer: 77 mL/min — ABNORMAL LOW (ref >90)
Glucose, Bld: 87 mg/dL (ref 70–99)
Potassium: 3.2 mEq/L — ABNORMAL LOW (ref 3.5–5.1)
Sodium: 136 mEq/L (ref 135–145)

## 2011-08-21 LAB — DIFFERENTIAL
Basophils Absolute: 0 10*3/uL (ref 0.0–0.1)
Basophils Relative: 0 % (ref 0–1)
Eosinophils Absolute: 0.1 10*3/uL (ref 0.0–0.7)
Eosinophils Relative: 1 % (ref 0–5)
Lymphocytes Relative: 36 % (ref 12–46)
Lymphs Abs: 3.2 10*3/uL (ref 0.7–4.0)
Monocytes Absolute: 0.7 10*3/uL (ref 0.1–1.0)
Monocytes Relative: 8 % (ref 3–12)
Neutro Abs: 5 10*3/uL (ref 1.7–7.7)
Neutrophils Relative %: 55 % (ref 43–77)

## 2011-08-21 LAB — CBC
HCT: 38.6 % (ref 36.0–46.0)
Hemoglobin: 13.5 g/dL (ref 12.0–15.0)
MCH: 32.1 pg (ref 26.0–34.0)
MCHC: 35 g/dL (ref 30.0–36.0)
MCV: 91.9 fL (ref 78.0–100.0)
Platelets: 223 10*3/uL (ref 150–400)
RBC: 4.2 MIL/uL (ref 3.87–5.11)
RDW: 12.3 % (ref 11.5–15.5)
WBC: 9 10*3/uL (ref 4.0–10.5)

## 2011-08-21 NOTE — ED Notes (Signed)
Per EMS: pt from home with recent hx of diverticulitis diagnosis. Patient sts significantly worsening bilateral abdominal pain since yesterday. Sts that in the past week she has had diarrhea x 2.

## 2011-08-21 NOTE — ED Notes (Signed)
[id number]<AV> Expected date:08/21/11<BR> Expected time: 8:52 PM<BR> Means of arrival:Ambulance<BR> Comments:<BR> M261, Diverticulitis, abd pain and diarrhea q 24 hrs

## 2011-08-22 LAB — URINALYSIS, ROUTINE W REFLEX MICROSCOPIC
Bilirubin Urine: NEGATIVE
Glucose, UA: NEGATIVE mg/dL
Hgb urine dipstick: NEGATIVE
Ketones, ur: NEGATIVE mg/dL
Nitrite: NEGATIVE
Protein, ur: NEGATIVE mg/dL
Specific Gravity, Urine: 1.014 (ref 1.005–1.030)
Urobilinogen, UA: 0.2 mg/dL (ref 0.0–1.0)
pH: 6 (ref 5.0–8.0)

## 2011-08-22 LAB — URINE MICROSCOPIC-ADD ON

## 2011-08-22 MED ORDER — METRONIDAZOLE 500 MG PO TABS: 500.0000 mg | ORAL_TABLET | Freq: Three times a day (TID) | ORAL | Status: DC

## 2011-08-22 MED ORDER — CIPROFLOXACIN HCL 500 MG PO TABS: 500.0000 mg | ORAL_TABLET | Freq: Two times a day (BID) | ORAL | Status: DC

## 2011-08-22 MED ORDER — POTASSIUM CHLORIDE CRYS ER 20 MEQ PO TBCR
20.0000 meq | EXTENDED_RELEASE_TABLET | Freq: Once | ORAL | Status: AC
  Administered 2011-08-22: 20 meq via ORAL
  Filled 2011-08-22: qty 1

## 2011-08-22 NOTE — ED Notes (Signed)
Patient given discharge instructions, information, prescriptions, and diet order. Patient states that they adequately understand discharge information given and to return to ED if symptoms return or worsen.     

## 2011-08-22 NOTE — ED Provider Notes (Signed)
History     CSN: [id number]  Arrival date & time 08/21/11  2132   First MD Initiated Contact with Patient 08/21/11 2219      Chief Complaint  Patient presents with  . Abdominal Pain  . Diarrhea    (Consider location/radiation/quality/duration/timing/severity/associated sxs/prior treatment) HPI  Past Medical History  Diagnosis Date  . Fibromyalgia   . Dyslipidemia   . History of chest pain   . Achalasia, esophageal     history of  . History of dizziness   . History of fatigue   . Hypertension   . Hemorrhoids   . Arthritis   . Diverticulitis     Past Surgical History  Procedure Date  . Cholecystectomy   . Rectocele repair   . Total abdominal hysterectomy   . Esophageal dilation     Family History  Problem Relation Age of Onset  . Cancer Mother     History  Substance Use Topics  . Smoking status: Never Smoker   . Smokeless tobacco: Not on file  . Alcohol Use: No    OB History    Grav Para Term Preterm Abortions TAB SAB Ect Mult Living                  Review of Systems  Allergies  Penicillins; Aspirin; Codeine; Sulfa drugs cross reactors; and Tramadol hcl  Home Medications   Current Outpatient Rx  Name Route Sig Dispense Refill  . ACETAMINOPHEN 325 MG PO TABS Oral Take 650 mg by mouth as needed.      . OCUVITE PO TABS Oral Take 1 tablet by mouth 2 (two) times daily.     Marland Kitchen CALCIUM CITRATE-VITAMIN D 315-200 MG-UNIT PO TABS Oral Take 1 tablet by mouth 2 (two) times daily.      Marland Kitchen VITAMIN D PO Oral Take 1 tablet by mouth daily.      Marland Kitchen DICYCLOMINE HCL 10 MG PO CAPS Oral Take 10 mg by mouth 3 (three) times daily as needed. For pain    . DILTIAZEM HCL ER 240 MG PO CP24 Oral Take 240 mg by mouth daily.      Marland Kitchen DIPHENHYDRAMINE HCL 25 MG PO CAPS Oral Take 25 mg by mouth daily as needed. For allergies    . GINKGO BILOBA PO Oral Take 1 capsule by mouth 2 (two) times daily.     Marland Kitchen LOPERAMIDE HCL 1 MG/7.5ML PO LIQD Oral Take 30 mLs by mouth 3 (three) times  daily as needed. For diarrhea    . LOSARTAN POTASSIUM-HCTZ 100-25 MG PO TABS Oral Take 1 tablet by mouth daily.      . MULTIVITAMINS PO TABS Oral Take 1 tablet by mouth daily. Taking ocuvite    . FISH OIL 1200 MG PO CAPS Oral Take 2 capsules by mouth 2 (two) times daily.      Marland Kitchen PROBIOTIC FORMULA PO Oral Take 1 capsule by mouth daily.     Marland Kitchen PROCTOSOL HC 2.5 % RE CREA Rectal Place 1 application rectally as needed. For hemorrhoids     . SERTRALINE HCL 100 MG PO TABS Oral Take 100 mg by mouth at bedtime.     Marland Kitchen VITAMIN B-12 250 MCG PO TABS Oral Take 1,000 mcg by mouth daily.     Marland Kitchen CIPROFLOXACIN HCL 500 MG PO TABS Oral Take 1 tablet (500 mg total) by mouth 2 (two) times daily. 20 tablet 0  . METRONIDAZOLE 500 MG PO TABS Oral Take 1 tablet (500 mg total) by  mouth 3 (three) times daily. 20 tablet 0    BP 165/145  Pulse 72  Temp(Src) 98.4 F (36.9 C) (Oral)  Ht 5\' 8"  (1.727 m)  Wt 160 lb (72.576 kg)  BMI 24.33 kg/m2  SpO2 97%  Physical Exam  ED Course  Procedures (including critical care time)  Labs Reviewed  URINALYSIS, ROUTINE W REFLEX MICROSCOPIC - Abnormal; Notable for the following:    Leukocytes, UA SMALL (*)    All other components within normal limits  BASIC METABOLIC PANEL - Abnormal; Notable for the following:    Potassium 3.2 (*)    GFR calc non Af Amer 77 (*)    GFR calc Af Amer 89 (*)    All other components within normal limits  URINE MICROSCOPIC-ADD ON - Abnormal; Notable for the following:    Squamous Epithelial / LPF FEW (*)    All other components within normal limits  CBC  DIFFERENTIAL  LAB REPORT - SCANNED   Ct Abdomen Pelvis W Contrast  08/20/2011  *RADIOLOGY REPORT*  Clinical Data: Lower abdominal pain since February.  Change in bowel habits.  Microscopic hematuria 02/13.  CT ABDOMEN AND PELVIS WITH CONTRAST  Technique:  Multidetector CT imaging of the abdomen and pelvis was performed following the standard protocol during bolus administration of intravenous  contrast.  Contrast: 30mL OMNIPAQUE IOHEXOL 300 MG/ML IJ SOLN, OMNIPAQUE IOHEXOL 300 MG/ML IJ SOLN  BUN and creatinine were obtained on site at [facility] at [address] Results:  BUN 7.0 mg/dL,  Creatinine 0.8 mg/dL.  Comparison: 07/30/2010  Findings: Mild scarring or volume loss at the right middle lobe. Mild cardiomegaly, without pericardial or pleural effusion.  Tiny hiatal hernia.  Variant lateral segment left liver lobe extending into the left upper quadrant.  Normal spleen, distal stomach, pancreas. Cholecystectomy without biliary ductal dilatation. Normal adrenal glands and right kidney.  Upper pole left renal lesion measures 1.5 cm and 40 HU on image 31 of series 2.  On the prior exam, this measured 1.4 cm and fluid density.  On delayed images, measures 37 HU today.  The IVC is relatively diminutive; appeared patent on the prior exam.  Stable small retroperitoneal nodes.  No retrocrural adenopathy.  Extensive colonic diverticulosis with mild edema adjacent the distal descending colon, including on image 51 of series 2. Coronal image 86.  Normal terminal ileum.  No surrounding abscess or free perforation.  Normal small bowel without abdominal ascites.    No pelvic adenopathy.  Normal urinary bladder.  Probable pelvic floor laxity.  Hysterectomy.  No adnexal mass or significant free pelvic fluid. No acute osseous abnormality.  IMPRESSION:  1.  Extensive colonic diverticulosis.  Subtle descending diverticulitis.  Uncomplicated. 2.  An upper pole left renal lesion which has undergone interval enlargement.  Is no longer is simple in appearance.  This could represent interval enlargement of a now hemorrhagic cyst or a solid neoplasm.  If the patient is a good surgical or treatment candidate, further evaluation with pre and post contrast abdominal MRI (preferred) or CT is recommended.  If not, ultrasound could be performed to allow surveillance. This study was made a "call report".  Original  Report Authenticated By: Consuello Bossier, M.D.     1. Diarrhea       MDM  Medical screening examination/treatment/procedure(s) were conducted as a shared visit with non-physician practitioner(s) and myself.  I personally evaluated the patient during the encounter Pt with left abd pain. Hx diverticulitis. Off abx  x 2 weeks. Mild left abd tenderness. Will get ct.         Suzi Roots, MD 08/22/11 1540

## 2011-08-22 NOTE — Discharge Instructions (Signed)
Please review the instructions below. You were seen in the emergency department tonight with several episodes of diarrhea you had all at home after taking a home remedy to help you have a bowel movement. Your blood work showed that your potassium was slightly low at 3.2 and you were given potassium here. Your remaining blood work and your urinalysis were normal. You are not dehydrated. As discussed, we are discharging you a home with prescriptions for Cipro and Flagyl. Please take as directed. Call Dr. Dulce Sellar on Monday to discuss your CT results and for further instructions regarding the plan for your ongoing gastrointestinal symptoms.  Return for worsening symptoms.     Diarrhea Infections caused by germs (bacterial) or a virus commonly cause diarrhea. Your caregiver has determined that with time, rest and fluids, the diarrhea should improve. In general, eat normally while drinking more water than usual. Although water may prevent dehydration, it does not contain salt and minerals (electrolytes). Broths, weak tea without caffeine and oral rehydration solutions (ORS) replace fluids and electrolytes. Small amounts of fluids should be taken frequently. Large amounts at one time may not be tolerated. Plain water may be harmful in infants and the elderly. Oral rehydrating solutions (ORS) are available at pharmacies and grocery stores. ORS replace water and important electrolytes in proper proportions. Sports drinks are not as effective as ORS and may be harmful due to sugars worsening diarrhea.  ORS is especially recommended for use in children with diarrhea. As a general guideline for children, replace any new fluid losses from diarrhea and/or vomiting with ORS as follows:   If your child weighs 22 pounds or under (10 kg or less), give 60-120 mL ( -  cup or 2 - 4 ounces) of ORS for each episode of diarrheal stool or vomiting episode.   If your child weighs more than 22 pounds (more than 10 kgs), give  120-240 mL ( - 1 cup or 4 - 8 ounces) of ORS for each diarrheal stool or episode of vomiting.   While correcting for dehydration, children should eat normally. However, foods high in sugar should be avoided because this may worsen diarrhea. Large amounts of carbonated soft drinks, juice, gelatin desserts and other highly sugared drinks should be avoided.   After correction of dehydration, other liquids that are appealing to the child may be added. Children should drink small amounts of fluids frequently and fluids should be increased as tolerated. Children should drink enough fluids to keep urine clear or pale yellow.   Adults should eat normally while drinking more fluids than usual. Drink small amounts of fluids frequently and increase as tolerated. Drink enough fluids to keep urine clear or pale yellow. Broths, weak decaffeinated tea, lemon lime soft drinks (allowed to go flat) and ORS replace fluids and electrolytes.   Avoid:   Carbonated drinks.   Juice.   Extremely hot or cold fluids.   Caffeine drinks.   Fatty, greasy foods.   Alcohol.   Tobacco.   Too much intake of anything at one time.   Gelatin desserts.   Probiotics are active cultures of beneficial bacteria. They may lessen the amount and number of diarrheal stools in adults. Probiotics can be found in yogurt with active cultures and in supplements.   Wash hands well to avoid spreading bacteria and virus.   Anti-diarrheal medications are not recommended for infants and children.   Only take over-the-counter or prescription medicines for pain, discomfort or fever as directed by your caregiver. Do  not give aspirin to children because it may cause Reye's Syndrome.   For adults, ask your caregiver if you should continue all prescribed and over-the-counter medicines.   If your caregiver has given you a follow-up appointment, it is very important to keep that appointment. Not keeping the appointment could result in a  chronic or permanent injury, and disability. If there is any problem keeping the appointment, you must call back to this facility for assistance.  SEEK IMMEDIATE MEDICAL CARE IF:   You or your child is unable to keep fluids down or other symptoms or problems become worse in spite of treatment.   Vomiting or diarrhea develops and becomes persistent.   There is vomiting of blood or bile (green material).   There is blood in the stool or the stools are black and tarry.   There is no urine output in 6-8 hours or there is only a small amount of very dark urine.   Abdominal pain develops, increases or localizes.   You have a fever.   Your baby is older than 3 months with a rectal temperature of 102 F (38.9 C) or higher.   Your baby is 61 months old or younger with a rectal temperature of 100.4 F (38 C) or higher.   You or your child develops excessive weakness, dizziness, fainting or extreme thirst.   You or your child develops a rash, stiff neck, severe headache or become irritable or sleepy and difficult to awaken.  MAKE SURE YOU:   Understand these instructions.   Will watch your condition.   Will get help right away if you are not doing well or get worse.  Document Released: 05/14/2002 Document Revised: 05/13/2011 Document Reviewed: 03/31/2009 [facility] Patient Information 2012 [place], [place].Diarrhea Infections caused by germs (bacterial) or a virus commonly cause diarrhea. Your caregiver has determined that with time, rest and fluids, the diarrhea should improve. In general, eat normally while drinking more water than usual. Although water may prevent dehydration, it does not contain salt and minerals (electrolytes). Broths, weak tea without caffeine and oral rehydration solutions (ORS) replace fluids and electrolytes. Small amounts of fluids should be taken frequently. Large amounts at one time may not be tolerated. Plain water may be harmful in infants and the elderly. Oral  rehydrating solutions (ORS) are available at pharmacies and grocery stores. ORS replace water and important electrolytes in proper proportions. Sports drinks are not as effective as ORS and may be harmful due to sugars worsening diarrhea.  ORS is especially recommended for use in children with diarrhea. As a general guideline for children, replace any new fluid losses from diarrhea and/or vomiting with ORS as follows:   If your child weighs 22 pounds or under (10 kg or less), give 60-120 mL ( -  cup or 2 - 4 ounces) of ORS for each episode of diarrheal stool or vomiting episode.   If your child weighs more than 22 pounds (more than 10 kgs), give 120-240 mL ( - 1 cup or 4 - 8 ounces) of ORS for each diarrheal stool or episode of vomiting.   While correcting for dehydration, children should eat normally. However, foods high in sugar should be avoided because this may worsen diarrhea. Large amounts of carbonated soft drinks, juice, gelatin desserts and other highly sugared drinks should be avoided.   After correction of dehydration, other liquids that are appealing to the child may be added. Children should drink small amounts of fluids frequently and fluids should be  increased as tolerated. Children should drink enough fluids to keep urine clear or pale yellow.   Adults should eat normally while drinking more fluids than usual. Drink small amounts of fluids frequently and increase as tolerated. Drink enough fluids to keep urine clear or pale yellow. Broths, weak decaffeinated tea, lemon lime soft drinks (allowed to go flat) and ORS replace fluids and electrolytes.   Avoid:   Carbonated drinks.   Juice.   Extremely hot or cold fluids.   Caffeine drinks.   Fatty, greasy foods.   Alcohol.   Tobacco.   Too much intake of anything at one time.   Gelatin desserts.   Probiotics are active cultures of beneficial bacteria. They may lessen the amount and number of diarrheal stools in adults.  Probiotics can be found in yogurt with active cultures and in supplements.   Wash hands well to avoid spreading bacteria and virus.   Anti-diarrheal medications are not recommended for infants and children.   Only take over-the-counter or prescription medicines for pain, discomfort or fever as directed by your caregiver. Do not give aspirin to children because it may cause Reye's Syndrome.   For adults, ask your caregiver if you should continue all prescribed and over-the-counter medicines.   If your caregiver has given you a follow-up appointment, it is very important to keep that appointment. Not keeping the appointment could result in a chronic or permanent injury, and disability. If there is any problem keeping the appointment, you must call back to this facility for assistance.  SEEK IMMEDIATE MEDICAL CARE IF:   You or your child is unable to keep fluids down or other symptoms or problems become worse in spite of treatment.   Vomiting or diarrhea develops and becomes persistent.   There is vomiting of blood or bile (green material).   There is blood in the stool or the stools are black and tarry.   There is no urine output in 6-8 hours or there is only a small amount of very dark urine.   Abdominal pain develops, increases or localizes.   You have a fever.   Your baby is older than 3 months with a rectal temperature of 102 F (38.9 C) or higher.   Your baby is 25 months old or younger with a rectal temperature of 100.4 F (38 C) or higher.   You or your child develops excessive weakness, dizziness, fainting or extreme thirst.   You or your child develops a rash, stiff neck, severe headache or become irritable or sleepy and difficult to awaken.  MAKE SURE YOU:   Understand these instructions.   Will watch your condition.   Will get help right away if you are not doing well or get worse.  Document Released: 05/14/2002 Document Revised: 05/13/2011 Document Reviewed:  03/31/2009 [facility] Patient Information 2012 [place], [place].

## 2011-08-22 NOTE — ED Provider Notes (Signed)
History     CSN: [id number]  Arrival date & time 08/21/11  2132   First MD Initiated Contact with Patient 08/21/11 2219      Chief Complaint  Patient presents with  . Abdominal Pain  . Diarrhea    HPI: Patient is a 78 y.o. female presenting with abdominal pain and diarrhea.  Abdominal Pain The primary symptoms of the illness include abdominal pain, diarrhea and hematemesis. The primary symptoms of the illness do not include fever, nausea, vomiting, hematochezia or dysuria. The current episode started 3 to 5 hours ago. The onset of the illness was gradual. The problem has been gradually worsening.  The illness is associated with laxative use. The patient has had a change in bowel habit. Risk factors for an acute abdominal problem include being elderly. Significant associated medical issues include diverticulitis.  Diarrhea The primary symptoms include abdominal pain, diarrhea and hematemesis. Primary symptoms do not include fever, nausea, vomiting, hematochezia or dysuria.  Significant associated medical issues include diverticulitis.   Pt reports c/o several episodes of explosive diarrhea this evening. States she drank some "plum juice" because of her persistent abdominal bloating as she thought this would help her have a BM.  She normally takes this to help her have a BM. Soon after ingesting the juice she began to have uncontrollable diarrhea. Patient reports history of ongoing abdominal bloating and intermittent (L) mid and lower abdominal pain. States that she has been seen multiple times by her primary care physician and her GI physician. Saw Dr. Dulce Sellar on Thursday and CT abdomen pelvis was ordered for Friday. Scan was done patient does not have results. Patient denies abdominal pain at this time and states there's been no further diarrhea since arrival to the department. Denies chest pain, shortness of breath, fever, UTI symptoms or toher associated symptoms.  Past Medical History    Diagnosis Date  . Fibromyalgia   . Dyslipidemia   . History of chest pain   . Achalasia, esophageal     history of  . History of dizziness   . History of fatigue   . Hypertension   . Hemorrhoids   . Arthritis   . Diverticulitis     Past Surgical History  Procedure Date  . Cholecystectomy   . Rectocele repair   . Total abdominal hysterectomy   . Esophageal dilation     Family History  Problem Relation Age of Onset  . Cancer Mother     History  Substance Use Topics  . Smoking status: Never Smoker   . Smokeless tobacco: Not on file  . Alcohol Use: No    OB History    Grav Para Term Preterm Abortions TAB SAB Ect Mult Living                  Review of Systems  Constitutional: Negative.  Negative for fever.  HENT: Negative.   Eyes: Negative.   Respiratory: Negative.   Cardiovascular: Negative.   Gastrointestinal: Positive for abdominal pain, diarrhea and hematemesis. Negative for nausea, vomiting and hematochezia.  Genitourinary: Negative.  Negative for dysuria.  Musculoskeletal: Negative.   Skin: Negative.   Neurological: Negative.   Hematological: Negative.   Psychiatric/Behavioral: Negative.     Allergies  Penicillins; Aspirin; Codeine; Sulfa drugs cross reactors; and Tramadol hcl  Home Medications   Current Outpatient Rx  Name Route Sig Dispense Refill  . ACETAMINOPHEN 325 MG PO TABS Oral Take 650 mg by mouth as needed.      Marland Kitchen  OCUVITE PO TABS Oral Take 1 tablet by mouth 2 (two) times daily.     Marland Kitchen CALCIUM CITRATE-VITAMIN D 315-200 MG-UNIT PO TABS Oral Take 1 tablet by mouth 2 (two) times daily.      Marland Kitchen VITAMIN D PO Oral Take 1 tablet by mouth daily.      Marland Kitchen DICYCLOMINE HCL 10 MG PO CAPS Oral Take 10 mg by mouth 3 (three) times daily as needed. For pain    . DILTIAZEM HCL ER 240 MG PO CP24 Oral Take 240 mg by mouth daily.      Marland Kitchen DIPHENHYDRAMINE HCL 25 MG PO CAPS Oral Take 25 mg by mouth daily as needed. For allergies    . GINKGO BILOBA PO Oral Take 1  capsule by mouth 2 (two) times daily.     Marland Kitchen LOPERAMIDE HCL 1 MG/7.5ML PO LIQD Oral Take 30 mLs by mouth 3 (three) times daily as needed. For diarrhea    . LOSARTAN POTASSIUM-HCTZ 100-25 MG PO TABS Oral Take 1 tablet by mouth daily.      . MULTIVITAMINS PO TABS Oral Take 1 tablet by mouth daily. Taking ocuvite    . FISH OIL 1200 MG PO CAPS Oral Take 2 capsules by mouth 2 (two) times daily.      Marland Kitchen PROBIOTIC FORMULA PO Oral Take 1 capsule by mouth daily.     Marland Kitchen PROCTOSOL HC 2.5 % RE CREA Rectal Place 1 application rectally as needed. For hemorrhoids     . SERTRALINE HCL 100 MG PO TABS Oral Take 100 mg by mouth at bedtime.     Marland Kitchen VITAMIN B-12 250 MCG PO TABS Oral Take 1,000 mcg by mouth daily.       BP 133/91  Pulse 75  Temp(Src) 98.4 F (36.9 C) (Oral)  Ht 5\' 8"  (1.727 m)  Wt 160 lb (72.576 kg)  BMI 24.33 kg/m2  SpO2 99%  Physical Exam  Constitutional: She is oriented to person, place, and time. She appears well-developed and well-nourished.  HENT:  Head: Normocephalic and atraumatic.  Eyes: Conjunctivae are normal.  Neck: Neck supple.  Cardiovascular: Normal rate and regular rhythm.   Pulmonary/Chest: Effort normal and breath sounds normal.  Abdominal: Soft. Bowel sounds are normal.  Musculoskeletal: Normal range of motion.  Neurological: She is alert and oriented to person, place, and time.  Skin: Skin is warm and dry. No erythema.  Psychiatric: She has a normal mood and affect.    ED Course  Procedures  Labs Reviewed  URINALYSIS, ROUTINE W REFLEX MICROSCOPIC - Abnormal; Notable for the following:    Leukocytes, UA SMALL (*)    All other components within normal limits  BASIC METABOLIC PANEL - Abnormal; Notable for the following:    Potassium 3.2 (*)    GFR calc non Af Amer 77 (*)    GFR calc Af Amer 89 (*)    All other components within normal limits  URINE MICROSCOPIC-ADD ON - Abnormal; Notable for the following:    Squamous Epithelial / LPF FEW (*)    All other  components within normal limits  CBC  DIFFERENTIAL   Ct Abdomen Pelvis W Contrast  08/20/2011  *RADIOLOGY REPORT*  Clinical Data: Lower abdominal pain since February.  Change in bowel habits.  Microscopic hematuria 02/13.  CT ABDOMEN AND PELVIS WITH CONTRAST  Technique:  Multidetector CT imaging of the abdomen and pelvis was performed following the standard protocol during bolus administration of intravenous contrast.  Contrast: 30mL OMNIPAQUE IOHEXOL 300 MG/ML  IJ SOLN, OMNIPAQUE IOHEXOL 300 MG/ML IJ SOLN  BUN and creatinine were obtained on site at [facility] at [address] Results:  BUN 7.0 mg/dL,  Creatinine 0.8 mg/dL.  Comparison: 07/30/2010  Findings: Mild scarring or volume loss at the right middle lobe. Mild cardiomegaly, without pericardial or pleural effusion.  Tiny hiatal hernia.  Variant lateral segment left liver lobe extending into the left upper quadrant.  Normal spleen, distal stomach, pancreas. Cholecystectomy without biliary ductal dilatation. Normal adrenal glands and right kidney.  Upper pole left renal lesion measures 1.5 cm and 40 HU on image 31 of series 2.  On the prior exam, this measured 1.4 cm and fluid density.  On delayed images, measures 37 HU today.  The IVC is relatively diminutive; appeared patent on the prior exam.  Stable small retroperitoneal nodes.  No retrocrural adenopathy.  Extensive colonic diverticulosis with mild edema adjacent the distal descending colon, including on image 51 of series 2. Coronal image 86.  Normal terminal ileum.  No surrounding abscess or free perforation.  Normal small bowel without abdominal ascites.    No pelvic adenopathy.  Normal urinary bladder.  Probable pelvic floor laxity.  Hysterectomy.  No adnexal mass or significant free pelvic fluid. No acute osseous abnormality.  IMPRESSION:  1.  Extensive colonic diverticulosis.  Subtle descending diverticulitis.  Uncomplicated. 2.  An upper pole left renal lesion which has  undergone interval enlargement.  Is no longer is simple in appearance.  This could represent interval enlargement of a now hemorrhagic cyst or a solid neoplasm.  If the patient is a good surgical or treatment candidate, further evaluation with pre and post contrast abdominal MRI (preferred) or CT is recommended.  If not, ultrasound could be performed to allow surveillance. This study was made a "call report".  Original Report Authenticated By: Consuello Bossier, M.D.     No diagnosis found.    MDM  HPI/PE and clinical findings c/w 1. Diarrhea (Mx episodes after taking a home remedy to help have a BM. Abd exam unremarkable.  No further episodes since arrival to ED. Ct abd/pelvis w/ cm performed yesterday w/o acute abd process. Pt is to all Dr Dulce Sellar Monday to arrange f/u to discuss the results of the CT and ongoing plan)        Leanne Chang, NP 08/23/11 [phone]

## 2011-08-23 NOTE — ED Provider Notes (Signed)
Medical screening examination/treatment/procedure(s) were conducted as a shared visit with non-physician practitioner(s) and myself.  I personally evaluated the patient during the encounter Pt with left abd pain, diarrhea, pain mild. Mild tenderness on exam. No fever or chills. Hx diverticula. Ct.   Suzi Roots, MD 08/23/11 [phone]

## 2011-08-30 ENCOUNTER — Other Ambulatory Visit: Payer: Self-pay

## 2011-08-30 ENCOUNTER — Observation Stay (HOSPITAL_COMMUNITY)
Admission: EM | Admit: 2011-08-30 | Discharge: 2011-08-31 | Disposition: A | Discharge status: Home / Self Care | Payer: Medicare Other | Attending: Internal Medicine | Admitting: Internal Medicine

## 2011-08-30 ENCOUNTER — Emergency Department (HOSPITAL_COMMUNITY): Payer: Medicare Other

## 2011-08-30 ENCOUNTER — Encounter (HOSPITAL_COMMUNITY): Payer: Self-pay | Admitting: *Deleted

## 2011-08-30 DIAGNOSIS — K219 Gastro-esophageal reflux disease without esophagitis: Secondary | ICD-10-CM | POA: Insufficient documentation

## 2011-08-30 DIAGNOSIS — E785 Hyperlipidemia, unspecified: Secondary | ICD-10-CM | POA: Diagnosis present

## 2011-08-30 DIAGNOSIS — R197 Diarrhea, unspecified: Secondary | ICD-10-CM

## 2011-08-30 DIAGNOSIS — M797 Fibromyalgia: Secondary | ICD-10-CM | POA: Diagnosis present

## 2011-08-30 DIAGNOSIS — K5792 Diverticulitis of intestine, part unspecified, without perforation or abscess without bleeding: Secondary | ICD-10-CM

## 2011-08-30 DIAGNOSIS — E7800 Pure hypercholesterolemia, unspecified: Secondary | ICD-10-CM | POA: Diagnosis present

## 2011-08-30 DIAGNOSIS — R42 Dizziness and giddiness: Secondary | ICD-10-CM | POA: Insufficient documentation

## 2011-08-30 DIAGNOSIS — Z79899 Other long term (current) drug therapy: Secondary | ICD-10-CM | POA: Insufficient documentation

## 2011-08-30 DIAGNOSIS — IMO0001 Reserved for inherently not codable concepts without codable children: Secondary | ICD-10-CM | POA: Insufficient documentation

## 2011-08-30 DIAGNOSIS — Z87898 Personal history of other specified conditions: Secondary | ICD-10-CM | POA: Diagnosis present

## 2011-08-30 DIAGNOSIS — G4730 Sleep apnea, unspecified: Secondary | ICD-10-CM | POA: Insufficient documentation

## 2011-08-30 DIAGNOSIS — K220 Achalasia of cardia: Secondary | ICD-10-CM | POA: Diagnosis present

## 2011-08-30 DIAGNOSIS — I10 Essential (primary) hypertension: Secondary | ICD-10-CM | POA: Diagnosis present

## 2011-08-30 DIAGNOSIS — R079 Chest pain, unspecified: Principal | ICD-10-CM | POA: Diagnosis present

## 2011-08-30 LAB — COMPREHENSIVE METABOLIC PANEL
AST: 29 U/L (ref 0–37)
Albumin: 3.9 g/dL (ref 3.5–5.2)
BUN: 11 mg/dL (ref 6–23)
Calcium: 9.7 mg/dL (ref 8.4–10.5)
Creatinine, Ser: 0.65 mg/dL (ref 0.50–1.10)
Total Bilirubin: 0.4 mg/dL (ref 0.3–1.2)
Total Protein: 7.3 g/dL (ref 6.0–8.3)

## 2011-08-30 LAB — DIFFERENTIAL
Eosinophils Absolute: 0.1 10*3/uL (ref 0.0–0.7)
Eosinophils Relative: 1 % (ref 0–5)
Lymphocytes Relative: 36 % (ref 12–46)
Lymphs Abs: 2.7 10*3/uL (ref 0.7–4.0)
Monocytes Absolute: 0.6 10*3/uL (ref 0.1–1.0)
Monocytes Relative: 8 % (ref 3–12)

## 2011-08-30 LAB — PROTIME-INR
INR: 0.96 (ref 0.00–1.49)
Prothrombin Time: 13 seconds (ref 11.6–15.2)

## 2011-08-30 LAB — CBC
HCT: 41.7 % (ref 36.0–46.0)
Hemoglobin: 14.5 g/dL (ref 12.0–15.0)
MCH: 32.2 pg (ref 26.0–34.0)
MCV: 92.7 fL (ref 78.0–100.0)
RBC: 4.5 MIL/uL (ref 3.87–5.11)
WBC: 7.4 10*3/uL (ref 4.0–10.5)

## 2011-08-30 LAB — POCT I-STAT, CHEM 8
Glucose, Bld: 93 mg/dL (ref 70–99)
HCT: 43 % (ref 36.0–46.0)
Hemoglobin: 14.6 g/dL (ref 12.0–15.0)
Potassium: 3.6 mEq/L (ref 3.5–5.1)
Sodium: 138 mEq/L (ref 135–145)

## 2011-08-30 LAB — POCT I-STAT TROPONIN I
Troponin i, poc: 0 ng/mL (ref 0.00–0.08)
Troponin i, poc: 0.01 ng/mL (ref 0.00–0.08)

## 2011-08-30 LAB — APTT: aPTT: 31 seconds (ref 24–37)

## 2011-08-30 MED ORDER — LOSARTAN POTASSIUM 50 MG PO TABS
100.0000 mg | ORAL_TABLET | Freq: Every day | ORAL | Status: DC
  Administered 2011-08-31: 100 mg via ORAL
  Filled 2011-08-30 (×2): qty 2

## 2011-08-30 MED ORDER — LOSARTAN POTASSIUM-HCTZ 100-25 MG PO TABS: 1.0000 | ORAL_TABLET | Freq: Every day | ORAL | Status: DC

## 2011-08-30 MED ORDER — HYDROCODONE-ACETAMINOPHEN 5-325 MG PO TABS: 1.0000 | ORAL_TABLET | ORAL | Status: DC | PRN

## 2011-08-30 MED ORDER — OMEGA-3-ACID ETHYL ESTERS 1 G PO CAPS
1.0000 g | ORAL_CAPSULE | Freq: Two times a day (BID) | ORAL | Status: DC
  Administered 2011-08-30 – 2011-08-31 (×2): 1 g via ORAL
  Filled 2011-08-30 (×5): qty 1

## 2011-08-30 MED ORDER — OCUVITE PO TABS
1.0000 | ORAL_TABLET | Freq: Two times a day (BID) | ORAL | Status: DC
  Administered 2011-08-30 – 2011-08-31 (×2): 1 via ORAL
  Filled 2011-08-30 (×5): qty 1

## 2011-08-30 MED ORDER — NITROGLYCERIN 0.4 MG SL SUBL: 0.4000 mg | SUBLINGUAL_TABLET | SUBLINGUAL | Status: DC | PRN

## 2011-08-30 MED ORDER — SERTRALINE HCL 100 MG PO TABS
100.0000 mg | ORAL_TABLET | Freq: Every day | ORAL | Status: DC
  Filled 2011-08-30 (×2): qty 1

## 2011-08-30 MED ORDER — RIFAXIMIN 550 MG PO TABS
550.0000 mg | ORAL_TABLET | Freq: Two times a day (BID) | ORAL | Status: DC
  Administered 2011-08-30 – 2011-08-31 (×2): 550 mg via ORAL
  Filled 2011-08-30 (×5): qty 1

## 2011-08-30 MED ORDER — POTASSIUM CHLORIDE IN NACL 20-0.9 MEQ/L-% IV SOLN
INTRAVENOUS | Status: DC
  Administered 2011-08-31: 04:00:00 via INTRAVENOUS
  Filled 2011-08-30 (×4): qty 1000

## 2011-08-30 MED ORDER — ACETAMINOPHEN 500 MG PO TABS: 500.0000 mg | ORAL_TABLET | Freq: Four times a day (QID) | ORAL | Status: DC | PRN

## 2011-08-30 MED ORDER — HYDROCHLOROTHIAZIDE 25 MG PO TABS
25.0000 mg | ORAL_TABLET | Freq: Every day | ORAL | Status: DC
  Administered 2011-08-31: 25 mg via ORAL
  Filled 2011-08-30 (×2): qty 1

## 2011-08-30 MED ORDER — NITROGLYCERIN 2 % TD OINT
1.0000 [in_us] | TOPICAL_OINTMENT | Freq: Four times a day (QID) | TRANSDERMAL | Status: DC
  Administered 2011-08-30 – 2011-08-31 (×3): 1 [in_us] via TOPICAL
  Filled 2011-08-30 (×2): qty 30

## 2011-08-30 MED ORDER — ONDANSETRON HCL 4 MG/2ML IJ SOLN: 4.0000 mg | Freq: Four times a day (QID) | INTRAMUSCULAR | Status: DC | PRN

## 2011-08-30 MED ORDER — PANTOPRAZOLE SODIUM 40 MG IV SOLR
40.0000 mg | INTRAVENOUS | Status: DC
  Administered 2011-08-30: 40 mg via INTRAVENOUS
  Filled 2011-08-30 (×3): qty 40

## 2011-08-30 MED ORDER — DILTIAZEM HCL ER 240 MG PO CP24
240.0000 mg | ORAL_CAPSULE | Freq: Every day | ORAL | Status: DC
  Administered 2011-08-31: 240 mg via ORAL
  Filled 2011-08-30 (×2): qty 1

## 2011-08-30 MED ORDER — ZOLPIDEM TARTRATE 5 MG PO TABS: 5.0000 mg | ORAL_TABLET | Freq: Every evening | ORAL | Status: DC | PRN

## 2011-08-30 MED ORDER — ENOXAPARIN SODIUM 40 MG/0.4ML ~~LOC~~ SOLN
40.0000 mg | Freq: Every day | SUBCUTANEOUS | Status: DC
  Administered 2011-08-30: 40 mg via SUBCUTANEOUS
  Filled 2011-08-30 (×3): qty 0.4

## 2011-08-30 MED ORDER — DICYCLOMINE HCL 10 MG PO CAPS
10.0000 mg | ORAL_CAPSULE | Freq: Three times a day (TID) | ORAL | Status: DC
  Administered 2011-08-31 (×2): 10 mg via ORAL
  Filled 2011-08-30 (×8): qty 1

## 2011-08-30 MED ORDER — ONDANSETRON HCL 4 MG PO TABS: 4.0000 mg | ORAL_TABLET | Freq: Four times a day (QID) | ORAL | Status: DC | PRN

## 2011-08-30 MED ORDER — SODIUM CHLORIDE 0.9 % IV SOLN
20.0000 mL | INTRAVENOUS | Status: DC
  Administered 2011-08-30: 20 mL via INTRAVENOUS

## 2011-08-30 NOTE — H&P (Signed)
PCP:   [name], MD, MD  GI Dr. Dulce Sellar Cardiologist Dr. Peter [place]   Chief Complaint:  Chest pains  HPI: This is a rather pleasant 78 year old female who states that she was at the mall walking today with her sister when she became lightheaded and dizzy. She also developed left-sided chest pain. She described it as dull, 8/10, lasted approximately 10 minutes. She was short of breath, she stated she felt presyncopal. She denies any palpitation. She states her chest pain did not radiate. She has been having lots of heartburn recently. She denies any nausea or vomiting, lower extremity edema, wheezing. She does see Dr. Peter [place] cardiologist and she states her last stress test was 3 years ago which was negative. She's never had a heart cath. She states and she sees Dr. [place] for angina but she also states she almost never gets chest pain.  Patient was recently diagnosed with diverticulitis. She is on antibiotics for this. She states she's been having constant diarrhea since she started this. Her diarrhea is been there for 3 weeks. She's been getting some lightheadedness and dizziness associated with her medications. Last night she got hallucinations. Her medications was changed today she was started on Xifaxan. She has a history of achalasia and esophageal dilation. She denies any difficulty with swallowing.  Review of Systems: Positives bolded  anorexia, fever, weight loss,, vision loss, decreased hearing, hoarseness, chest pain, syncope, dyspnea on exertion, peripheral edema, balance deficits, hemoptysis, abdominal pain, melena, hematochezia, severe indigestion/heartburn, hematuria, incontinence, genital sores, muscle weakness, suspicious skin lesions, transient blindness, difficulty walking, depression, unusual weight change, abnormal bleeding, enlarged lymph nodes, angioedema, and breast masses.  Past Medical History: Past Medical History  Diagnosis Date  . Fibromyalgia   .  Dyslipidemia   . History of chest pain   . Achalasia, esophageal     history of  . History of dizziness   . History of fatigue   . Hypertension   . Hemorrhoids   . Arthritis   . Diverticulitis   . Kidney problem     spot on kidney, pt  says being elevated in April 2013  . Sleep apnea   . Acid reflux    Past Surgical History  Procedure Date  . Cholecystectomy   . Rectocele repair   . Total abdominal hysterectomy   . Esophageal dilation     Medications: Prior to Admission medications   Medication Sig Start Date End Date Taking? Authorizing Provider  acetaminophen (TYLENOL) 500 MG tablet Take 500 mg by mouth every 6 (six) hours as needed. For arthritis   Yes Historical Provider, MD  beta carotene w/minerals (OCUVITE) tablet Take 1 tablet by mouth 2 (two) times daily.    Yes Historical Provider, MD  calcium citrate-vitamin D (CITRACAL+D) 315-200 MG-UNIT per tablet Take 1 tablet by mouth 2 (two) times daily.     Yes Historical Provider, MD  Cholecalciferol (VITAMIN D PO) Take 1 tablet by mouth daily.     Yes Historical Provider, MD  dicyclomine (BENTYL) 10 MG capsule Take 10 mg by mouth 4 (four) times daily -  before meals and at bedtime. For stomach spasms   Yes Historical Provider, MD  diltiazem (DILACOR XR) 240 MG 24 hr capsule Take 240 mg by mouth daily.    Yes Historical Provider, MD  GINKGO BILOBA PO Take 1 capsule by mouth 2 (two) times daily.    Yes Historical Provider, MD  Loperamide HCl (IMODIUM A-D) 1 MG/7.5ML LIQD Take 30 mLs  by mouth 3 (three) times daily as needed. For diarrhea   Yes Historical Provider, MD  losartan-hydrochlorothiazide (HYZAAR) 100-25 MG per tablet Take 1 tablet by mouth daily.     Yes Historical Provider, MD  omega-3 acid ethyl esters (LOVAZA) 1 G capsule Take 1 g by mouth 2 (two) times daily.   Yes Historical Provider, MD  Probiotic Product (PROBIOTIC FORMULA PO) Take 1 capsule by mouth daily.    Yes Historical Provider, MD  rifaximin (XIFAXAN) 550 MG  TABS Take 550 mg by mouth 2 (two) times daily.   Yes Historical Provider, MD  sertraline (ZOLOFT) 100 MG tablet Take 100 mg by mouth at bedtime.    Yes Historical Provider, MD  vitamin B-12 (CYANOCOBALAMIN) 1000 MCG tablet Take 1,000 mcg by mouth daily.   Yes Historical Provider, MD  diphenhydrAMINE (BENADRYL) 25 mg capsule Take 25 mg by mouth daily as needed. For allergies    Historical Provider, MD    Allergies:   Allergies  Allergen Reactions  . Penicillins Swelling and Rash  . Statins   . Sulfa Drugs Cross Reactors Shortness Of Breath  . Tramadol Hcl Anaphylaxis  . Aspirin Swelling  . Codeine Other (See Comments)    unknown    Social History:  reports that she has never smoked. She has never used smokeless tobacco. She reports that she does not drink alcohol or use illicit drugs. lives alone, recently widowed and in February 2013. Does not use oxygen. Uses a CPAP machine. Doesn't use a [place]  Family History: Family History  Problem Relation Age of Onset  . Cancer Mother     Physical Exam: Filed Vitals:   08/30/11 1932  BP: 155/55  Pulse: 65  Resp: 16  SpO2: 95%    General:  Alert and oriented times three, well developed and nourished, no acute distress Eyes: PERRLA, pink conjunctiva, no scleral icterus ENT: Moist oral mucosa, neck supple, no thyromegaly Lungs: clear to ascultation, no wheeze, no crackles, no use of accessory muscles Cardiovascular: regular rate and rhythm, no regurgitation, no gallops, no murmurs. No carotid bruits, no JVD Abdomen: soft, positive BS, nonspecific tenderness to palpation generalized, non-distended, no organomegaly, not an acute abdomen GU: not examined Neuro: CN II - XII grossly intact, sensation intact Musculoskeletal: strength 5/5 all extremities, no clubbing, cyanosis or edema, some mild reproducible chest wall pain left-sided, patient states is different from her previous chest pain Skin: no rash, no subcutaneous crepitation, no  decubitus Psych: appropriate patient   Labs on Admission:   [facility] 08/30/11 1729 08/30/11 1717  NA 138 133*  K 3.6 3.6  CL 101 97  CO2 -- 27  GLUCOSE 93 93  BUN 11 11  CREATININE 0.70 0.65  CALCIUM -- 9.7  MG -- --  PHOS -- --  Results for Kimberly, Acevedo (MRN [id number]) as of 08/30/2011 19:23  Ref. Range 08/30/2011 17:29  Troponin i, poc Latest Range: 0.00-0.08 ng/mL 0.00    Basename 08/30/11 1717  AST 29  ALT 17  ALKPHOS 62  BILITOT 0.4  PROT 7.3  ALBUMIN 3.9   No results found for this basename: LIPASE:2,AMYLASE:2 in the last 72 hours  Basename 08/30/11 1729 08/30/11 1717  WBC -- 7.4  NEUTROABS -- 4.1  HGB 14.6 14.5  HCT 43.0 41.7  MCV -- 92.7  PLT -- 246   No results found for this basename: CKTOTAL:3,CKMB:3,CKMBINDEX:3,TROPONINI:3 in the last 72 hours No components found with this basename: POCBNP:3 No results found for this basename:  DDIMER:2 in the last 72 hours No results found for this basename: HGBA1C:2 in the last 72 hours No results found for this basename: CHOL:2,HDL:2,LDLCALC:2,TRIG:2,CHOLHDL:2,LDLDIRECT:2 in the last 72 hours No results found for this basename: TSH,T4TOTAL,FREET3,T3FREE,THYROIDAB in the last 72 hours No results found for this basename: VITAMINB12:2,FOLATE:2,FERRITIN:2,TIBC:2,IRON:2,RETICCTPCT:2 in the last 72 hours  Micro Results: No results found for this or any previous visit (from the past 240 hour(s)).   Radiological Exams on Admission: Dg Chest Portable 1 View  08/30/2011  *RADIOLOGY REPORT*  Clinical Data: 78 year old female with chest pain.  PORTABLE CHEST - 1 VIEW  Comparison: 11/13/2008 and prior chest radiographs.  Findings: Upper limits normal heart size again noted. There is no evidence of focal airspace disease, pulmonary edema, suspicious pulmonary nodule/mass, pleural effusion, or pneumothorax. No acute bony abnormalities are identified.  IMPRESSION: No evidence of acute cardiopulmonary disease.  Original Report  Authenticated By: Rosendo Gros, M.D.    EKG normal sinus rhythm   Assessment/Plan Present on Admission:  .Chest pain Admit to telemetry and observation status Patient is allergic to aspirin, low suspicion for cardiac etiology for chest pain, therefore, will monitor on telemetry.Plavix not ordered  DVT prophylaxis, lipid panel, resume home medications Nitroglycerin when necessary chest pain 10 Protonix  Diarrhea On the C. difficile toxins  Likely due to antibiotics. Diverticulitis  Patient sees GI Dr. Dulce Sellar.continue her home medications  .Dyslipidemia .Achalasia, esophageal .Fibromyalgia .History of chest pain .Hypercholesterolemia .Hypertension Stable resume home medications   DO NOT RESUSCITATE GI and DVT prophylaxis Team 5/Dr. Delene Loll, Kimberly Acevedo 08/30/2011, 7:54 PM

## 2011-08-30 NOTE — ED Provider Notes (Signed)
History     CSN: [id number]  Arrival date & time 08/30/11  1636   First MD Initiated Contact with Patient 08/30/11 1709      Chief Complaint  Patient presents with  . Chest Pain  . Weakness    HPI The patient presents to the emergency room after acute episode of chest pain and weakness. She was out shopping when about 30 minutes ago she felt very lightheaded and short of breath. Acutely following that she experienced pain in her chest that was sharp and radiating to her left arm. Patient states that lasted for about 15 minutes and has now resolved. During the episode she felt like she was going to pass out. She has not had any fever, cough or vomiting. Incidentally, she was recently diagnosed with diverticulitis. She is currently taking antibiotics for that. She change her antibiotics yesterday after she started to experience some hallucinations associated with her initial antibiotic. patient is not currently having any issues with that at this time to Past Medical History  Diagnosis Date  . Fibromyalgia   . Dyslipidemia   . History of chest pain   . Achalasia, esophageal     history of  . History of dizziness   . History of fatigue   . Hypertension   . Hemorrhoids   . Arthritis   . Diverticulitis     Past Surgical History  Procedure Date  . Cholecystectomy   . Rectocele repair   . Total abdominal hysterectomy   . Esophageal dilation     Family History  Problem Relation Age of Onset  . Cancer Mother     History  Substance Use Topics  . Smoking status: Never Smoker   . Smokeless tobacco: Not on file  . Alcohol Use: No    OB History    Grav Para Term Preterm Abortions TAB SAB Ect Mult Living                  Review of Systems  All other systems reviewed and are negative.    Allergies  Penicillins; Aspirin; Codeine; Sulfa drugs cross reactors; and Tramadol hcl  Home Medications   Current Outpatient Rx  Name Route Sig Dispense Refill  . ACETAMINOPHEN  325 MG PO TABS Oral Take 650 mg by mouth as needed.      . OCUVITE PO TABS Oral Take 1 tablet by mouth 2 (two) times daily.     Marland Kitchen CALCIUM CITRATE-VITAMIN D 315-200 MG-UNIT PO TABS Oral Take 1 tablet by mouth 2 (two) times daily.      Marland Kitchen VITAMIN D PO Oral Take 1 tablet by mouth daily.      Marland Kitchen CIPROFLOXACIN HCL 500 MG PO TABS Oral Take 1 tablet (500 mg total) by mouth 2 (two) times daily. 20 tablet 0  . DICYCLOMINE HCL 10 MG PO CAPS Oral Take 10 mg by mouth 3 (three) times daily as needed. For pain    . DILTIAZEM HCL ER 240 MG PO CP24 Oral Take 240 mg by mouth daily.      Marland Kitchen DIPHENHYDRAMINE HCL 25 MG PO CAPS Oral Take 25 mg by mouth daily as needed. For allergies    . GINKGO BILOBA PO Oral Take 1 capsule by mouth 2 (two) times daily.     Marland Kitchen LOPERAMIDE HCL 1 MG/7.5ML PO LIQD Oral Take 30 mLs by mouth 3 (three) times daily as needed. For diarrhea    . LOSARTAN POTASSIUM-HCTZ 100-25 MG PO TABS Oral Take 1 tablet  by mouth daily.      Marland Kitchen METRONIDAZOLE 500 MG PO TABS Oral Take 1 tablet (500 mg total) by mouth 3 (three) times daily. 20 tablet 0  . MULTIVITAMINS PO TABS Oral Take 1 tablet by mouth daily. Taking ocuvite    . FISH OIL 1200 MG PO CAPS Oral Take 2 capsules by mouth 2 (two) times daily.      Marland Kitchen PROBIOTIC FORMULA PO Oral Take 1 capsule by mouth daily.     Marland Kitchen PROCTOSOL HC 2.5 % RE CREA Rectal Place 1 application rectally as needed. For hemorrhoids     . SERTRALINE HCL 100 MG PO TABS Oral Take 100 mg by mouth at bedtime.     Marland Kitchen VITAMIN B-12 250 MCG PO TABS Oral Take 1,000 mcg by mouth daily.       There were no vitals taken for this visit.  Physical Exam  Nursing note and vitals reviewed. Constitutional: She appears well-developed and well-nourished. No distress.  HENT:  Head: Normocephalic and atraumatic.  Right Ear: External ear normal.  Left Ear: External ear normal.  Eyes: Conjunctivae are normal. Right eye exhibits no discharge. Left eye exhibits no discharge. No scleral icterus.  Neck:  Neck supple. No tracheal deviation present.  Cardiovascular: Normal rate, regular rhythm and intact distal pulses.   Pulmonary/Chest: Effort normal and breath sounds normal. No stridor. No respiratory distress. She has no wheezes. She has no rales.  Abdominal: Soft. Bowel sounds are normal. She exhibits no distension. There is no tenderness. There is no rebound and no guarding.  Musculoskeletal: She exhibits no edema and no tenderness.  Neurological: She is alert. She has normal strength. No sensory deficit. Cranial nerve deficit:  no gross defecits noted. She exhibits normal muscle tone. She displays no seizure activity. Coordination normal.  Skin: Skin is warm and dry. No rash noted.  Psychiatric: She has a normal mood and affect.    ED Course  Procedures (including critical care time)  Date: 08/30/2011  Rate: 78  Rhythm: normal sinus rhythm  QRS Axis: normal  Intervals: normal  ST/T Wave abnormalities: normal  Conduction Disutrbances:none  Narrative Interpretation:   Old EKG Reviewed: none available  Medications  0.9 %  sodium chloride infusion (not administered)  nitroGLYCERIN (NITROGLYN) 2 % ointment 1 inch (1 inch Topical Given 08/30/11 1754)  rifaximin (XIFAXAN) 550 MG TABS (not administered)  acetaminophen (TYLENOL) 500 MG tablet (not administered)  vitamin B-12 (CYANOCOBALAMIN) 1000 MCG tablet (not administered)  omega-3 acid ethyl esters (LOVAZA) 1 G capsule (not administered)     Labs Reviewed  COMPREHENSIVE METABOLIC PANEL - Abnormal; Notable for the following:    Sodium 133 (*)    GFR calc non Af Amer 81 (*)    All other components within normal limits  CBC  DIFFERENTIAL  PROTIME-INR  APTT  POCT I-STAT, CHEM 8  POCT I-STAT TROPONIN I   Dg Chest Portable 1 View  08/30/2011  *RADIOLOGY REPORT*  Clinical Data: 78 year old female with chest pain.  PORTABLE CHEST - 1 VIEW  Comparison: 11/13/2008 and prior chest radiographs.  Findings: Upper limits normal heart size  again noted. There is no evidence of focal airspace disease, pulmonary edema, suspicious pulmonary nodule/mass, pleural effusion, or pneumothorax. No acute bony abnormalities are identified.  IMPRESSION: No evidence of acute cardiopulmonary disease.  Original Report Authenticated By: Rosendo Gros, M.D.     1. Chest pain       MDM  Patient is pain-free at this time. Her  initial workup is normal. However, her symptoms to raise the concern of coronary artery disease as the source of her pain today. Patient did feel very lightheaded and had radiation the pain to the left upper extremity.   Patient has been having some nausea and wonders if this could be GERD. This is certainly a possibility, however, with her symptoms and her age I feel that it is reasonable to admit her to the hospital for observation, serial EKGs and cardiac enzymes.        Celene Kras, MD 08/30/11 [phone]

## 2011-08-30 NOTE — ED Notes (Signed)
Called nurse to give report. Sec. Took number and said that she would call back.

## 2011-08-30 NOTE — ED Notes (Signed)
Pt reports chest pain and weakness while out shopping approx 30 minutes ago. Sob reported. Denies n/v, diaphoresis. Is currently being treated for c diff.

## 2011-08-31 LAB — BASIC METABOLIC PANEL
CO2: 28 mEq/L (ref 19–32)
Glucose, Bld: 101 mg/dL — ABNORMAL HIGH (ref 70–99)
Potassium: 3.5 mEq/L (ref 3.5–5.1)
Sodium: 139 mEq/L (ref 135–145)

## 2011-08-31 LAB — TROPONIN I
Troponin I: <0.3 ng/mL (ref <0.30)
Troponin I: <0.3 ng/mL (ref <0.30)

## 2011-08-31 LAB — CBC
Hemoglobin: 13 g/dL (ref 12.0–15.0)
MCH: 32.3 pg (ref 26.0–34.0)
MCV: 95 fL (ref 78.0–100.0)
RBC: 4.02 MIL/uL (ref 3.87–5.11)

## 2011-08-31 NOTE — Progress Notes (Signed)
CARE MANAGE MENT UTILIZATION REVIEW NOTE 08/31/2011     Patient:  Kimberly Acevedo, Kimberly Acevedo   Account Number:  [id number]  Documented by:  Bjorn Loser Dorris Vangorder   Per Ur Regulation Reviewed for med. necessity/level of care/duration of stay

## 2011-08-31 NOTE — Discharge Summary (Signed)
HOSPITAL DISCHARGE SUMMARY  Kimberly Acevedo  MRN: [id number]  DOB:1928/10/30  Date of Admission: 08/30/2011 Date of Discharge: 08/31/2011         LOS: 1 day   Attending Physician:  Clydia Llano A  Patient's PCP:  Kimberly Moh, MD, MD  Consults: None  Discharge Diagnoses: Present on Admission:  .Chest pain .Dyslipidemia .Achalasia, esophageal .Fibromyalgia .History of chest pain .Hypercholesterolemia .Hypertension   Medication List  As of 08/31/2011  6:20 PM   STOP taking these medications         acetaminophen 325 MG tablet      Fish Oil 1200 MG Caps      vitamin B-12 250 MCG tablet      XIFAXAN 550 MG Tabs         TAKE these medications         acetaminophen 500 MG tablet   Commonly known as: TYLENOL   Take 500 mg by mouth every 6 (six) hours as needed. For arthritis      beta carotene w/minerals tablet   Take 1 tablet by mouth 2 (two) times daily.      calcium citrate-vitamin D 315-200 MG-UNIT per tablet   Commonly known as: CITRACAL+D   Take 1 tablet by mouth 2 (two) times daily.      dicyclomine 10 MG capsule   Commonly known as: BENTYL   Take 10 mg by mouth 4 (four) times daily -  before meals and at bedtime. For stomach spasms      diltiazem 240 MG 24 hr capsule   Commonly known as: DILACOR XR   Take 240 mg by mouth daily.      diphenhydrAMINE 25 mg capsule   Commonly known as: BENADRYL   Take 25 mg by mouth daily as needed. For allergies      GINKGO BILOBA PO   Take 1 capsule by mouth 2 (two) times daily.      IMODIUM A-D 1 MG/7.5ML Liqd   Generic drug: Loperamide HCl   Take 30 mLs by mouth 3 (three) times daily as needed. For diarrhea      losartan-hydrochlorothiazide 100-25 MG per tablet   Commonly known as: HYZAAR   Take 1 tablet by mouth daily.      omega-3 acid ethyl esters 1 G capsule   Commonly known as: LOVAZA   Take 1 g by mouth 2 (two) times daily.      PROBIOTIC FORMULA PO   Take 1 capsule by mouth daily.     sertraline 100 MG tablet   Commonly known as: ZOLOFT   Take 100 mg by mouth at bedtime.      vitamin B-12 1000 MCG tablet   Commonly known as: CYANOCOBALAMIN   Take 1,000 mcg by mouth daily.      VITAMIN D PO   Take 1 tablet by mouth daily.             Brief Admission History: This is a rather pleasant 78 year old female who states that she was at the mall walking today with her sister when she became lightheaded and dizzy. She also developed left-sided chest pain. She described it as dull, 8/10, lasted approximately 10 minutes. She was short of breath, she stated she felt presyncopal. She denies any palpitation. She states her chest pain did not radiate. She has been having lots of heartburn recently. She denies any nausea or vomiting, lower extremity edema, wheezing. She does see Dr. Peter Acevedo cardiologist and she states her last  stress test was 3 years ago which was negative. She's never had a heart cath. She states and she sees Dr. [place] for angina but she also states she almost never gets chest pain.  Patient was recently diagnosed with diverticulitis. She is on antibiotics for this. She states she's been having constant diarrhea since she started this. Her diarrhea is been there for 3 weeks. She's been getting some lightheadedness and dizziness associated with her medications. Last night she got hallucinations. Her medications was changed today she was started on Xifaxan. She has a history of achalasia and esophageal dilation. She denies any difficulty with swallowing.  Hospital Course: Present on Admission:  .Chest pain .Dyslipidemia .Achalasia, esophageal .Fibromyalgia .History of chest pain .Hypercholesterolemia .Hypertension  1. Dizziness: Patient admitted to the hospital because of lightheadedness and dizziness. Per patient she was in a department store and she felt dizzy while she was woken, she managed to come back to to her sister who was waiting for her. She asked  her to bring her to the hospital because she's not feeling right. Patient denies any chest pain, denied that the feeling that she is about to black out. Patient mentioned that she was recently diagnosed with diverticulitis and she was been treated with antibiotic for the past 2-1/2 weeks, she mentioned she's getting progressively dizzy because of the medications. Patient medications reviewed, she is on rifaximin which is the only new medication which can cause dizziness. Patient wanted not to be on that medication and she said her abdominal pain is gone about a week ago. Still have on and off diarrhea. But she wants to take something else. I instructed the patient to hold taking the rifaximin and to check with her primary care physician as well as her gastroenterologist. At the day of discharge patient denies any dizziness or chest pain.  2. Chest pain: As mentioned above, it was reported some time that she have chest pain. Patient denied any chest pain since yesterday. She said she really came in to the hospital because of worsening of dizziness. But anyway chest x-ray is negative, 12-lead EKG and 3 sets of cardiac enzymes were negative for any evidence of acute coronary syndrome. Patient cardiologist Dr. Peter Acevedo patient instructed to followup with him as needed. Patient said she might had indigestion, which resolved by now.  3. HTN: Controlled, her medications being continued without any changes.  4. Hypercholesterolemia: Please see the lipid profile report below, patient LDL is 109.  Her triglycerides is 240, patient advised about heart healthy diet.   Day of Discharge BP 154/62  Pulse 74  Temp(Src) 98.3 F (36.8 C) (Oral)  Resp 18  Ht 5\' 8"  (1.727 m)  Wt 72.576 kg (160 lb)  BMI 24.33 kg/m2  SpO2 95% Physical Exam: GEN: No acute distress, cooperative with exam PSYCH: alert and oriented x4; does not appear anxious does not appear depressed; affect is normal  HEENT: Mucous membranes pink  and anicteric;  Mouth: without oral thrush or lesions Eyes: PERRLA; EOM intact;  Neck: no cervical lymphadenopathy nor thyromegaly or carotid bruit; no JVD;  CHEST WALL: No tenderness, symmetrical to breathing bilaterally CHEST: Normal respiration, clear to auscultation bilaterally  HEART: Regular rate and rhythm; no murmurs, rubs or gallops, S1 and S2 heard  BACK: No kyphosis or scoliosis; no CVA tenderness  ABDOMEN:  soft non-tender; no masses, no organomegaly, normal abdominal bowel sounds; no pannus; no intertriginous candida.  EXTREMITIES: No bone or joint deformity; no edema; no ulcerations.  PULSES: 2+ and symmetric, neurovascularity is intact SKIN: Normal hydration no rash or ulceration, no flushing or suspicious lesions  CNS: Cranial nerves 2-12 grossly intact no focal neurologic deficit, coordination is intact gait not tested    Results for orders placed during the hospital encounter of 08/30/11 (from the past 24 hour(s))  POCT I-STAT TROPONIN I     Status: Normal   Collection Time   08/30/11  9:09 PM      Component Value Range   Troponin i, poc 0.01  0.00 - 0.08 (ng/mL)   Comment 3           TROPONIN I     Status: Normal   Collection Time   08/31/11  4:44 AM      Component Value Range   Troponin I <0.30  <0.30 (ng/mL)  LIPID PANEL     Status: Abnormal   Collection Time   08/31/11  4:44 AM      Component Value Range   Cholesterol 192  0 - 200 (mg/dL)   Triglycerides 409 (*) <150 (mg/dL)   HDL 35 (*) >81 (mg/dL)   Total CHOL/HDL Ratio 5.5     VLDL 48 (*) 0 - 40 (mg/dL)   LDL Cholesterol 191 (*) 0 - 99 (mg/dL)  BASIC METABOLIC PANEL     Status: Abnormal   Collection Time   08/31/11  4:44 AM      Component Value Range   Sodium 139  135 - 145 (mEq/L)   Potassium 3.5  3.5 - 5.1 (mEq/L)   Chloride 103  96 - 112 (mEq/L)   CO2 28  19 - 32 (mEq/L)   Glucose, Bld 101 (*) 70 - 99 (mg/dL)   BUN 12  6 - 23 (mg/dL)   Creatinine, Ser 4.78  0.50 - 1.10 (mg/dL)   Calcium 9.1   8.4 - 10.5 (mg/dL)   GFR calc non Af Amer 77 (*) >90 (mL/min)   GFR calc Af Amer 89 (*) >90 (mL/min)  CBC     Status: Normal   Collection Time   08/31/11  4:44 AM      Component Value Range   WBC 6.9  4.0 - 10.5 (K/uL)   RBC 4.02  3.87 - 5.11 (MIL/uL)   Hemoglobin 13.0  12.0 - 15.0 (g/dL)   HCT 29.5  62.1 - 30.8 (%)   MCV 95.0  78.0 - 100.0 (fL)   MCH 32.3  26.0 - 34.0 (pg)   MCHC 34.0  30.0 - 36.0 (g/dL)   RDW 65.7  84.6 - 96.2 (%)   Platelets 213  150 - 400 (K/uL)  TROPONIN I     Status: Normal   Collection Time   08/31/11 10:15 AM      Component Value Range   Troponin I <0.30  <0.30 (ng/mL)    Disposition: Home   Follow-up Appts: Discharge Orders    Future Orders Please Complete By Expires   Diet - low sodium heart healthy      Increase activity slowly         Follow-up Information    Follow up with Kimberly Moh, MD .         I spent 40 minutes completing paperwork and coordinating discharge efforts.  SignedClydia Llano A 08/31/2011, 6:20 PM

## 2011-08-31 NOTE — Progress Notes (Signed)
Discharge to home accompanied by sister, no complaints of any pain or discomfort. D/C instructions , follow up appointments done and given to patient verbalized understanding.  PIV  Removed no s/s of infiltration ,no swelling  Noted.

## 2011-11-03 ENCOUNTER — Ambulatory Visit (INDEPENDENT_AMBULATORY_CARE_PROVIDER_SITE_OTHER): Payer: Medicare Other | Admitting: Cardiology

## 2011-11-03 ENCOUNTER — Encounter: Payer: Self-pay | Admitting: Cardiology

## 2011-11-03 VITALS — BP 141/69 | HR 64 | Ht 68.0 in | Wt 161.0 lb

## 2011-11-03 DIAGNOSIS — I10 Essential (primary) hypertension: Secondary | ICD-10-CM

## 2011-11-03 DIAGNOSIS — E7800 Pure hypercholesterolemia, unspecified: Secondary | ICD-10-CM

## 2011-11-03 NOTE — Assessment & Plan Note (Signed)
Blood pressure control is satisfactory on losartan HCT and to Dilacor.

## 2011-11-03 NOTE — Progress Notes (Signed)
   Kimberly Acevedo Date of Birth: [birth year]   History of Present Illness: Kimberly Acevedo is seen for followup today. She states she has been doing well. Earlier this year she was diagnosed with diverticulitis. She was hospitalized in March of this year with some atypical chest pain. Her evaluation at that time was unremarkable with serial cardiac enzymes and ECG. Since then she denies any chest pain, shortness of breath, or palpitations.  Current Outpatient Prescriptions on File Prior to Visit  Medication Sig Dispense Refill  . acetaminophen (TYLENOL) 500 MG tablet Take 500 mg by mouth every 6 (six) hours as needed. For arthritis      . beta carotene w/minerals (OCUVITE) tablet Take 1 tablet by mouth 2 (two) times daily.       . calcium citrate-vitamin D (CITRACAL+D) 315-200 MG-UNIT per tablet Take 1 tablet by mouth 2 (two) times daily.        Marland Kitchen diltiazem (DILACOR XR) 240 MG 24 hr capsule Take 240 mg by mouth daily.       . diphenhydrAMINE (BENADRYL) 25 mg capsule Take 25 mg by mouth daily as needed. For allergies      . losartan-hydrochlorothiazide (HYZAAR) 100-25 MG per tablet Take 1 tablet by mouth daily.        . sertraline (ZOLOFT) 100 MG tablet Take 100 mg by mouth at bedtime.       . vitamin B-12 (CYANOCOBALAMIN) 1000 MCG tablet Take 1,000 mcg by mouth daily.        Allergies  Allergen Reactions  . Penicillins Swelling and Rash  . Statins   . Sulfa Drugs Cross Reactors Shortness Of Breath  . Tramadol Hcl Anaphylaxis  . Aspirin Swelling  . Codeine Other (See Comments)    unknown    Past Medical History  Diagnosis Date  . Fibromyalgia   . Dyslipidemia   . History of chest pain   . Achalasia, esophageal     history of  . History of dizziness   . History of fatigue   . Hypertension   . Hemorrhoids   . Arthritis   . Diverticulitis   . Kidney problem     spot on kidney, pt  says being elevated in April 2013  . Sleep apnea   . Acid reflux     Past Surgical History    Procedure Date  . Cholecystectomy   . Rectocele repair   . Total abdominal hysterectomy   . Esophageal dilation     History  Smoking status  . Never Smoker   Smokeless tobacco  . Never Used    History  Alcohol Use No    Family History  Problem Relation Age of Onset  . Cancer Mother     Review of Systems: As noted in history of present illness.  All other systems were reviewed and are negative.  Physical Exam: BP 141/69  Pulse 64  Ht 5\' 8"  (1.727 m)  Wt 161 lb (73.029 kg)  BMI 24.48 kg/m2 She is a pleasant elderly white female in no acute distress. HEENT exam is unremarkable. She has no JVD or bruits. Lungs are clear.. Cardiac exam reveals a regular rate and rhythm without gallop or murmur. Abdomen is soft and nontender. She has no edema. Pedal pulses are good. LABORATORY DATA:  Assessment / Plan:

## 2011-11-03 NOTE — Patient Instructions (Signed)
Continue your current medication.  I will see you again in 6 months.   

## 2012-02-02 ENCOUNTER — Other Ambulatory Visit: Payer: Self-pay | Admitting: Endocrinology

## 2012-02-02 ENCOUNTER — Ambulatory Visit
Admission: RE | Admit: 2012-02-02 | Discharge: 2012-02-02 | Disposition: A | Discharge status: Home / Self Care | Payer: Medicare Other | Source: Ambulatory Visit | Attending: Endocrinology | Admitting: Endocrinology

## 2012-02-02 DIAGNOSIS — E049 Nontoxic goiter, unspecified: Secondary | ICD-10-CM

## 2012-04-11 ENCOUNTER — Other Ambulatory Visit (HOSPITAL_COMMUNITY): Payer: Self-pay | Admitting: Internal Medicine

## 2012-04-11 DIAGNOSIS — Z1231 Encounter for screening mammogram for malignant neoplasm of breast: Secondary | ICD-10-CM

## 2012-04-20 ENCOUNTER — Ambulatory Visit (HOSPITAL_COMMUNITY): Payer: Medicare Other

## 2012-04-26 ENCOUNTER — Ambulatory Visit (HOSPITAL_COMMUNITY): Payer: Medicare Other | Attending: Internal Medicine

## 2012-05-19 ENCOUNTER — Ambulatory Visit (HOSPITAL_COMMUNITY)
Admission: RE | Admit: 2012-05-19 | Discharge: 2012-05-19 | Disposition: A | Discharge status: Home / Self Care | Payer: Medicare Other | Source: Ambulatory Visit | Attending: Internal Medicine | Admitting: Internal Medicine

## 2012-05-19 DIAGNOSIS — Z1231 Encounter for screening mammogram for malignant neoplasm of breast: Secondary | ICD-10-CM | POA: Insufficient documentation

## 2012-05-26 ENCOUNTER — Other Ambulatory Visit: Payer: Self-pay | Admitting: Internal Medicine

## 2012-05-26 DIAGNOSIS — R928 Other abnormal and inconclusive findings on diagnostic imaging of breast: Secondary | ICD-10-CM

## 2012-06-09 ENCOUNTER — Ambulatory Visit
Admission: RE | Admit: 2012-06-09 | Discharge: 2012-06-09 | Disposition: A | Discharge status: Home / Self Care | Payer: Medicare Other | Source: Ambulatory Visit | Attending: Internal Medicine | Admitting: Internal Medicine

## 2012-06-09 ENCOUNTER — Other Ambulatory Visit: Payer: Self-pay | Admitting: Internal Medicine

## 2012-06-09 DIAGNOSIS — R928 Other abnormal and inconclusive findings on diagnostic imaging of breast: Secondary | ICD-10-CM

## 2012-06-09 DIAGNOSIS — N6320 Unspecified lump in the left breast, unspecified quadrant: Secondary | ICD-10-CM

## 2012-06-15 ENCOUNTER — Other Ambulatory Visit: Payer: Self-pay | Admitting: Internal Medicine

## 2012-06-15 ENCOUNTER — Ambulatory Visit
Admission: RE | Admit: 2012-06-15 | Discharge: 2012-06-15 | Disposition: A | Discharge status: Home / Self Care | Payer: Medicare Other | Source: Ambulatory Visit | Attending: Internal Medicine | Admitting: Internal Medicine

## 2012-06-15 DIAGNOSIS — N6320 Unspecified lump in the left breast, unspecified quadrant: Secondary | ICD-10-CM

## 2012-06-23 ENCOUNTER — Encounter (INDEPENDENT_AMBULATORY_CARE_PROVIDER_SITE_OTHER): Payer: Self-pay | Admitting: General Surgery

## 2012-06-23 ENCOUNTER — Ambulatory Visit (INDEPENDENT_AMBULATORY_CARE_PROVIDER_SITE_OTHER): Payer: Medicare Other | Admitting: General Surgery

## 2012-06-23 VITALS — BP 130/78 | HR 67 | Temp 97.8°F | Resp 18 | Ht 68.0 in | Wt 164.0 lb

## 2012-06-23 DIAGNOSIS — D0510 Intraductal carcinoma in situ of unspecified breast: Secondary | ICD-10-CM

## 2012-06-23 DIAGNOSIS — C50919 Malignant neoplasm of unspecified site of unspecified female breast: Secondary | ICD-10-CM | POA: Insufficient documentation

## 2012-06-23 DIAGNOSIS — D0590 Unspecified type of carcinoma in situ of unspecified breast: Secondary | ICD-10-CM

## 2012-06-23 NOTE — Progress Notes (Signed)
Subjective:   new diagnosis of DCIS left breast  Patient ID: Kimberly Acevedo, female   DOB: 08/03/1928, 79 y.o.   MRN: [id number]  HPI Patient is a pleasant 79-year-old female referred by Dr. Boyle at the breast center for a new diagnosis of ductal carcinoma in situ of the left breast. The patient recently presented for routine screening mammogram. She had not noticed any breast symptoms, specifically no lump, nipple discharge, skin changes or pain. Mammogram suggested a small mass in the upper inner left breast. Subsequent diagnostic mammogram and ultrasound revealed an approximately 8 mm irregular mass at the 11:00 position in the upper inner left breast. Large core needle biopsy was recommended and performed. I personally reviewed the imaging and the pathology. This revealed a low-grade ductal carcinoma in situ, ER/PR positive. She is here today for surgical recommendations.  Past Medical History  Diagnosis Date  . Fibromyalgia   . Dyslipidemia   . History of chest pain   . Achalasia, esophageal     history of  . History of dizziness   . History of fatigue   . Hypertension   . Hemorrhoids   . Arthritis   . Diverticulitis   . Kidney problem     spot on kidney, pt  says being elevated in April 2013  . Sleep apnea   . Acid reflux    Past Surgical History  Procedure Date  . Rectocele repair   . Total abdominal hysterectomy   . Esophageal dilation   . Cholecystectomy 2007   Current Outpatient Prescriptions  Medication Sig Dispense Refill  . acetaminophen (TYLENOL) 500 MG tablet Take 500 mg by mouth every 6 (six) hours as needed. For arthritis      . beta carotene w/minerals (OCUVITE) tablet Take 1 tablet by mouth 2 (two) times daily.       . calcium citrate-vitamin D (CITRACAL+D) 315-200 MG-UNIT per tablet Take 1 tablet by mouth 2 (two) times daily.        . diltiazem (DILACOR XR) 240 MG 24 hr capsule Take 240 mg by mouth daily.       . diphenhydrAMINE (BENADRYL) 25 mg capsule Take  25 mg by mouth daily as needed. For allergies      . losartan-hydrochlorothiazide (HYZAAR) 100-25 MG per tablet Take 1 tablet by mouth daily.        . sertraline (ZOLOFT) 100 MG tablet Take 100 mg by mouth at bedtime.       . vitamin B-12 (CYANOCOBALAMIN) 1000 MCG tablet Take 1,000 mcg by mouth daily.       Allergies  Allergen Reactions  . Penicillins Swelling and Rash  . Statins   . Sulfa Drugs Cross Reactors Shortness Of Breath  . Tramadol Hcl Anaphylaxis  . Aspirin Swelling  . Codeine Other (See Comments)    unknown   History  Substance Use Topics  . Smoking status: Never Smoker   . Smokeless tobacco: Never Used  . Alcohol Use: No     Review of Systems  Constitutional: Negative for fever and unexpected weight change.  Respiratory: Negative.   Cardiovascular: Negative.   Gastrointestinal: Negative.   Genitourinary: Negative.   Musculoskeletal: Negative.        Objective:   Physical Exam General: Alert, well-developed Elderly Caucasian female, in no distress Skin: Warm and dry without rash or infection. HEENT: No palpable masses or thyromegaly. Sclera nonicteric. Pupils equal round and reactive. Oropharynx clear. Breasts: Slight bruising in the upper left breast from cornual   biopsy. No palpable masses in either breast. No skin changes or nipple inversion. Lymph nodes: No cervical, supraclavicular, or inguinal nodes palpable. Lungs: Breath sounds clear and equal without increased work of breathing Cardiovascular: Regular rate and rhythm without murmur. No JVD or edema. Peripheral pulses intact. Abdomen: Nondistended. Soft and nontender. No masses palpable. No organomegaly. No palpable hernias. Extremities: No edema or joint swelling or deformity. No chronic venous stasis changes. Neurologic: Alert and fully oriented. Gait normal.     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 I discussed  the findings in detail with the patient. I recommended proceeding with needle localized lumpectomy. We discussed the procedure in detail including its nature in recovery and risks of anesthetic complications, bleeding, infection, possible need for further surgery based on final pathology findings. I would suspect if we can get good margins she could avoid radiation.    Plan:     Needle localized left breast lumpectomy as an outpatient under general anesthesia      

## 2012-06-28 ENCOUNTER — Encounter (HOSPITAL_BASED_OUTPATIENT_CLINIC_OR_DEPARTMENT_OTHER): Payer: Self-pay | Admitting: *Deleted

## 2012-06-28 NOTE — Progress Notes (Signed)
To come in for bmet To bring all meds, overnight bag a cpap dos in case she needs to stay

## 2012-06-29 ENCOUNTER — Encounter (HOSPITAL_BASED_OUTPATIENT_CLINIC_OR_DEPARTMENT_OTHER)
Admission: RE | Admit: 2012-06-29 | Discharge: 2012-06-29 | Disposition: A | Discharge status: Home / Self Care | Payer: Medicare Other | Source: Ambulatory Visit | Attending: General Surgery | Admitting: General Surgery

## 2012-06-29 LAB — BASIC METABOLIC PANEL
BUN: 11 mg/dL (ref 6–23)
CO2: 31 mEq/L (ref 19–32)
Calcium: 9.7 mg/dL (ref 8.4–10.5)
Creatinine, Ser: 0.69 mg/dL (ref 0.50–1.10)
Glucose, Bld: 103 mg/dL — ABNORMAL HIGH (ref 70–99)

## 2012-07-04 ENCOUNTER — Ambulatory Visit (HOSPITAL_BASED_OUTPATIENT_CLINIC_OR_DEPARTMENT_OTHER)
Admission: RE | Admit: 2012-07-04 | Discharge: 2012-07-04 | Disposition: A | Discharge status: Home / Self Care | Payer: Medicare Other | Source: Ambulatory Visit | Attending: General Surgery | Admitting: General Surgery

## 2012-07-04 ENCOUNTER — Encounter (HOSPITAL_BASED_OUTPATIENT_CLINIC_OR_DEPARTMENT_OTHER): Payer: Self-pay | Admitting: *Deleted

## 2012-07-04 ENCOUNTER — Ambulatory Visit
Admission: RE | Admit: 2012-07-04 | Discharge: 2012-07-04 | Disposition: A | Discharge status: Home / Self Care | Payer: Medicare Other | Source: Ambulatory Visit | Attending: General Surgery | Admitting: General Surgery

## 2012-07-04 ENCOUNTER — Encounter (HOSPITAL_BASED_OUTPATIENT_CLINIC_OR_DEPARTMENT_OTHER)
Admission: RE | Disposition: A | Discharge status: Home / Self Care | Payer: Self-pay | Source: Ambulatory Visit | Attending: General Surgery

## 2012-07-04 ENCOUNTER — Ambulatory Visit (HOSPITAL_BASED_OUTPATIENT_CLINIC_OR_DEPARTMENT_OTHER): Payer: Medicare Other | Admitting: *Deleted

## 2012-07-04 DIAGNOSIS — K219 Gastro-esophageal reflux disease without esophagitis: Secondary | ICD-10-CM | POA: Insufficient documentation

## 2012-07-04 DIAGNOSIS — Z9089 Acquired absence of other organs: Secondary | ICD-10-CM | POA: Insufficient documentation

## 2012-07-04 DIAGNOSIS — G4730 Sleep apnea, unspecified: Secondary | ICD-10-CM | POA: Insufficient documentation

## 2012-07-04 DIAGNOSIS — D0510 Intraductal carcinoma in situ of unspecified breast: Secondary | ICD-10-CM

## 2012-07-04 DIAGNOSIS — Z882 Allergy status to sulfonamides status: Secondary | ICD-10-CM | POA: Insufficient documentation

## 2012-07-04 DIAGNOSIS — M129 Arthropathy, unspecified: Secondary | ICD-10-CM | POA: Insufficient documentation

## 2012-07-04 DIAGNOSIS — D0590 Unspecified type of carcinoma in situ of unspecified breast: Secondary | ICD-10-CM

## 2012-07-04 DIAGNOSIS — Z880 Allergy status to penicillin: Secondary | ICD-10-CM | POA: Insufficient documentation

## 2012-07-04 DIAGNOSIS — IMO0001 Reserved for inherently not codable concepts without codable children: Secondary | ICD-10-CM | POA: Insufficient documentation

## 2012-07-04 DIAGNOSIS — E785 Hyperlipidemia, unspecified: Secondary | ICD-10-CM | POA: Insufficient documentation

## 2012-07-04 DIAGNOSIS — Z886 Allergy status to analgesic agent status: Secondary | ICD-10-CM | POA: Insufficient documentation

## 2012-07-04 DIAGNOSIS — I10 Essential (primary) hypertension: Secondary | ICD-10-CM | POA: Insufficient documentation

## 2012-07-04 DIAGNOSIS — Z90710 Acquired absence of both cervix and uterus: Secondary | ICD-10-CM | POA: Insufficient documentation

## 2012-07-04 HISTORY — PX: BREAST LUMPECTOMY WITH NEEDLE LOCALIZATION: SHX5759

## 2012-07-04 LAB — POCT HEMOGLOBIN-HEMACUE: Hemoglobin: 14.8 g/dL (ref 12.0–15.0)

## 2012-07-04 SURGERY — BREAST LUMPECTOMY WITH NEEDLE LOCALIZATION
Anesthesia: General | Site: Breast | Laterality: Left | Wound class: Clean

## 2012-07-04 MED ORDER — PROPOFOL 10 MG/ML IV BOLUS
INTRAVENOUS | Status: DC | PRN
  Administered 2012-07-04: 150 mg via INTRAVENOUS

## 2012-07-04 MED ORDER — FENTANYL CITRATE 0.05 MG/ML IJ SOLN: 50.0000 ug | INTRAMUSCULAR | Status: DC | PRN

## 2012-07-04 MED ORDER — HYDROCODONE-ACETAMINOPHEN 5-325 MG PO TABS: 1.0000 | ORAL_TABLET | Freq: Four times a day (QID) | ORAL | Status: DC | PRN

## 2012-07-04 MED ORDER — HYDROMORPHONE HCL PF 1 MG/ML IJ SOLN
0.2500 mg | INTRAMUSCULAR | Status: DC | PRN
  Administered 2012-07-04: 0.5 mg via INTRAVENOUS

## 2012-07-04 MED ORDER — FENTANYL CITRATE 0.05 MG/ML IJ SOLN
INTRAMUSCULAR | Status: DC | PRN
  Administered 2012-07-04: 50 ug via INTRAVENOUS

## 2012-07-04 MED ORDER — BUPIVACAINE-EPINEPHRINE 0.25% -1:200000 IJ SOLN
INTRAMUSCULAR | Status: DC | PRN
  Administered 2012-07-04: 30 mL

## 2012-07-04 MED ORDER — CHLORHEXIDINE GLUCONATE 4 % EX LIQD: 1.0000 "application " | Freq: Once | CUTANEOUS | Status: DC

## 2012-07-04 MED ORDER — EPHEDRINE SULFATE 50 MG/ML IJ SOLN
INTRAMUSCULAR | Status: DC | PRN
  Administered 2012-07-04: 10 mg via INTRAVENOUS

## 2012-07-04 MED ORDER — OXYCODONE HCL 5 MG PO TABS: 5.0000 mg | ORAL_TABLET | Freq: Once | ORAL | Status: DC | PRN

## 2012-07-04 MED ORDER — ONDANSETRON HCL 4 MG/2ML IJ SOLN
INTRAMUSCULAR | Status: DC | PRN
  Administered 2012-07-04: 4 mg via INTRAVENOUS

## 2012-07-04 MED ORDER — LIDOCAINE HCL (CARDIAC) 20 MG/ML IV SOLN
INTRAVENOUS | Status: DC | PRN
  Administered 2012-07-04: 60 mg via INTRAVENOUS

## 2012-07-04 MED ORDER — DEXAMETHASONE SODIUM PHOSPHATE 4 MG/ML IJ SOLN
INTRAMUSCULAR | Status: DC | PRN
  Administered 2012-07-04: 4 mg via INTRAVENOUS

## 2012-07-04 MED ORDER — ACETAMINOPHEN 10 MG/ML IV SOLN
1000.0000 mg | Freq: Once | INTRAVENOUS | Status: AC
  Administered 2012-07-04: 1000 mg via INTRAVENOUS

## 2012-07-04 MED ORDER — ONDANSETRON HCL 4 MG/2ML IJ SOLN: 4.0000 mg | Freq: Once | INTRAMUSCULAR | Status: DC | PRN

## 2012-07-04 MED ORDER — MIDAZOLAM HCL 2 MG/2ML IJ SOLN: 1.0000 mg | INTRAMUSCULAR | Status: DC | PRN

## 2012-07-04 MED ORDER — LACTATED RINGERS IV SOLN
INTRAVENOUS | Status: DC
  Administered 2012-07-04 (×2): via INTRAVENOUS

## 2012-07-04 MED ORDER — CIPROFLOXACIN IN D5W 400 MG/200ML IV SOLN
400.0000 mg | INTRAVENOUS | Status: AC
  Administered 2012-07-04: 400 mg via INTRAVENOUS

## 2012-07-04 SURGICAL SUPPLY — 47 items
BINDER BREAST XLRG (GAUZE/BANDAGES/DRESSINGS) ×2 IMPLANT
BLADE SURG 10 STRL SS (BLADE) IMPLANT
BLADE SURG 15 STRL LF DISP TIS (BLADE) ×1 IMPLANT
BLADE SURG 15 STRL SS (BLADE) ×1
CANISTER SUCTION 1200CC (MISCELLANEOUS) IMPLANT
CHLORAPREP W/TINT 26ML (MISCELLANEOUS) ×2 IMPLANT
CLIP TI MEDIUM 6 (CLIP) IMPLANT
CLIP TI WIDE RED SMALL 6 (CLIP) ×2 IMPLANT
CLOTH BEACON ORANGE TIMEOUT ST (SAFETY) ×2 IMPLANT
COVER MAYO STAND STRL (DRAPES) ×2 IMPLANT
COVER TABLE BACK 60X90 (DRAPES) ×2 IMPLANT
DERMABOND ADVANCED (GAUZE/BANDAGES/DRESSINGS) ×1
DERMABOND ADVANCED .7 DNX12 (GAUZE/BANDAGES/DRESSINGS) ×1 IMPLANT
DEVICE DUBIN W/COMP PLATE 8390 (MISCELLANEOUS) ×2 IMPLANT
DRAPE PED LAPAROTOMY (DRAPES) ×2 IMPLANT
DRAPE UTILITY XL STRL (DRAPES) ×2 IMPLANT
ELECT COATED BLADE 2.86 ST (ELECTRODE) ×2 IMPLANT
ELECT REM PT RETURN 9FT ADLT (ELECTROSURGICAL) ×2
ELECTRODE REM PT RTRN 9FT ADLT (ELECTROSURGICAL) ×1 IMPLANT
GLOVE BIO SURGEON STRL SZ7.5 (GLOVE) ×2 IMPLANT
GLOVE BIOGEL PI IND STRL 8 (GLOVE) ×2 IMPLANT
GLOVE BIOGEL PI INDICATOR 8 (GLOVE) ×2
GLOVE SS BIOGEL STRL SZ 7.5 (GLOVE) ×1 IMPLANT
GLOVE SUPERSENSE BIOGEL SZ 7.5 (GLOVE) ×1
GOWN PREVENTION PLUS XLARGE (GOWN DISPOSABLE) ×2 IMPLANT
GOWN PREVENTION PLUS XXLARGE (GOWN DISPOSABLE) ×2 IMPLANT
KIT MARKER MARGIN INK (KITS) ×2 IMPLANT
NEEDLE HYPO 25X1 1.5 SAFETY (NEEDLE) ×2 IMPLANT
NS IRRIG 1000ML POUR BTL (IV SOLUTION) ×2 IMPLANT
PACK BASIN DAY SURGERY FS (CUSTOM PROCEDURE TRAY) ×2 IMPLANT
PENCIL BUTTON HOLSTER BLD 10FT (ELECTRODE) ×2 IMPLANT
SLEEVE SCD COMPRESS KNEE MED (MISCELLANEOUS) ×2 IMPLANT
STAPLER VISISTAT 35W (STAPLE) IMPLANT
SUT MON AB 3-0 SH 27 (SUTURE)
SUT MON AB 3-0 SH27 (SUTURE) IMPLANT
SUT MON AB 5-0 PS2 18 (SUTURE) ×2 IMPLANT
SUT SILK 3 0 SH 30 (SUTURE) IMPLANT
SUT VIC AB 3-0 SH 27 (SUTURE) ×2
SUT VIC AB 3-0 SH 27X BRD (SUTURE) ×2 IMPLANT
SUT VIC AB 4-0 BRD 54 (SUTURE) IMPLANT
SYR BULB 3OZ (MISCELLANEOUS) IMPLANT
SYR CONTROL 10ML LL (SYRINGE) ×2 IMPLANT
TOWEL OR 17X24 6PK STRL BLUE (TOWEL DISPOSABLE) ×4 IMPLANT
TOWEL OR NON WOVEN STRL DISP B (DISPOSABLE) ×2 IMPLANT
TUBE CONNECTING 20X1/4 (TUBING) IMPLANT
WATER STERILE IRR 1000ML POUR (IV SOLUTION) IMPLANT
YANKAUER SUCT BULB TIP NO VENT (SUCTIONS) IMPLANT

## 2012-07-04 NOTE — Transfer of Care (Signed)
Immediate Anesthesia Transfer of Care Note  Patient: Kimberly Acevedo  Procedure(s) Performed: Procedure(s) (LRB) with comments: BREAST LUMPECTOMY WITH NEEDLE LOCALIZATION (Left) - left  Patient Location: PACU  Anesthesia Type:General  Level of Consciousness: awake and patient cooperative  Airway & Oxygen Therapy: Patient Spontanous Breathing and Patient connected to face mask oxygen  Post-op Assessment: Report given to PACU RN and Post -op Vital signs reviewed and stable  Post vital signs: Reviewed and stable  Complications: No apparent anesthesia complications

## 2012-07-04 NOTE — Anesthesia Preprocedure Evaluation (Addendum)
Anesthesia Evaluation  Patient identified by MRN, date of birth, ID band Patient awake    Reviewed: Allergy & Precautions, H&P , NPO status , Patient's Chart, lab work & pertinent test results  Airway Mallampati: I TM Distance: >3 FB Neck ROM: Full    Dental  (+) Teeth Intact and Dental Advisory Given   Pulmonary sleep apnea and Continuous Positive Airway Pressure Ventilation ,  breath sounds clear to auscultation        Cardiovascular hypertension, Pt. on medications Rhythm:Regular Rate:Normal     Neuro/Psych    GI/Hepatic   Endo/Other    Renal/GU      Musculoskeletal   Abdominal   Peds  Hematology   Anesthesia Other Findings   Reproductive/Obstetrics                          Anesthesia Physical Anesthesia Plan  ASA: II  Anesthesia Plan: General   Post-op Pain Management:    Induction: Intravenous  Airway Management Planned: LMA  Additional Equipment:   Intra-op Plan:   Post-operative Plan: Extubation in OR  Informed Consent: I have reviewed the patients History and Physical, chart, labs and discussed the procedure including the risks, benefits and alternatives for the proposed anesthesia with the patient or authorized representative who has indicated his/her understanding and acceptance.   Dental advisory given  Plan Discussed with: CRNA, Surgeon and Anesthesiologist  Anesthesia Plan Comments:         Anesthesia Quick Evaluation

## 2012-07-04 NOTE — Anesthesia Procedure Notes (Signed)
Procedure Name: LMA Insertion Date/Time: 07/04/2012 2:04 PM Performed by: Trejan Buda D Pre-anesthesia Checklist: Patient identified, Emergency Drugs available, Suction available and Patient being monitored Patient Re-evaluated:Patient Re-evaluated prior to inductionOxygen Delivery Method: Circle System Utilized Preoxygenation: Pre-oxygenation with 100% oxygen Intubation Type: IV induction Ventilation: Mask ventilation without difficulty LMA: LMA inserted LMA Size: 4.0 Number of attempts: 1 Airway Equipment and Method: bite block Placement Confirmation: positive ETCO2 Tube secured with: Tape Dental Injury: Teeth and Oropharynx as per pre-operative assessment

## 2012-07-04 NOTE — Interval H&P Note (Signed)
History and Physical Interval Note:  07/04/2012 1:44 PM  Kimberly Acevedo  has presented today for surgery, with the diagnosis of left breast cancer  The various methods of treatment have been discussed with the patient and family. After consideration of risks, benefits and other options for treatment, the patient has consented to  Procedure(s) (LRB) with comments: BREAST LUMPECTOMY WITH NEEDLE LOCALIZATION (Left) - left as a surgical intervention .  The patient's history has been reviewed, patient examined, no change in status, stable for surgery.  I have reviewed the patient's chart and labs.  Questions were answered to the patient's satisfaction.     Kade Rickels T

## 2012-07-04 NOTE — Anesthesia Postprocedure Evaluation (Signed)
  Anesthesia Post-op Note  Patient: Kimberly Acevedo  Procedure(s) Performed: Procedure(s) (LRB) with comments: BREAST LUMPECTOMY WITH NEEDLE LOCALIZATION (Left) - left  Patient Location: PACU  Anesthesia Type:General  Level of Consciousness: awake, alert  and oriented  Airway and Oxygen Therapy: Patient Spontanous Breathing and Patient connected to face mask oxygen  Post-op Pain: mild  Post-op Assessment: Post-op Vital signs reviewed  Post-op Vital Signs: Reviewed  Complications: No apparent anesthesia complications

## 2012-07-04 NOTE — Op Note (Signed)
Preoperative Diagnosis: left breast cancer  Postoprative Diagnosis: left breast cancer  Procedure: Procedure(s): BREAST LUMPECTOMY WITH NEEDLE LOCALIZATION   Surgeon: Glenna Fellows T   Assistants: None  Anesthesia:  General LMA anesthesiaDiagnos  Indications:   Patient is an 79 year old female who on recent screening mammogram was found to have a small sub-1 cm mass in the upper inner left breast. Large core needle biopsy has revealed low-grade ductal carcinoma in situ. After discussion of treatment options we have elected to proceed with needle localized lumpectomy. The indications for the surgery and its nature and risks have been discussed in detail documented elsewhere.  Procedure Detail:  Following successful needle localization the patient is brought to the operating room and placed in the supine position on the operating table laryngeal mask general anesthesia induced. The entire breast was widely sterilely prepped and draped. She received preoperative IV antibiotics. Patient, was performed and the procedure verified. I made a curvilinear incision in the upper left breast over the area of the abnormality and dissection was carried down toward the breast capsule. Localization wire was brought into the incision and then I excised a generous specimen of breast tissue around the shaft and tip of the wire in an effort to get negative margins. The specimen was oriented with ink and specimen mammography showed the marking clip and the mass within the specimen. It appeared to be well contained although the lateral and posterior margins appeared relatively closer than others and I did a further excision of these 2 margins which were oriented and sent for permanent pathology. The soft tissue was infiltrated with Marcaine with epinephrine hemostasis obtained with cautery. The lumpectomy cavity was marked with clips. Breast the subcutaneous tissue was closed with interrupted 3-0 Vicryl the skin  with subcuticular 4-0 Monocryl and Dermabond. Sponge needle and sponge counts were correct.   Estimated Blood Loss:  Minimal         Drains: none  Blood Given: none          Specimens: #1 left breast lumpectomy #2 further posterior margin oriented #3 further lateral margin oriented        Complications:  * No complications entered in OR log *         Disposition: PACU - hemodynamically stable.         Condition: stable

## 2012-07-04 NOTE — H&P (View-Only) (Signed)
Subjective:   new diagnosis of DCIS left breast  Patient ID: Kimberly Acevedo, female   DOB: 04/12/1929, 79 y.o.   MRN: [id number]  HPI Patient is a pleasant 79 year old female referred by Dr. Micheline Maze at the breast center for a new diagnosis of ductal carcinoma in situ of the left breast. The patient recently presented for routine screening mammogram. She had not noticed any breast symptoms, specifically no lump, nipple discharge, skin changes or pain. Mammogram suggested a small mass in the upper inner left breast. Subsequent diagnostic mammogram and ultrasound revealed an approximately 8 mm irregular mass at the 11:00 position in the upper inner left breast. Large core needle biopsy was recommended and performed. I personally reviewed the imaging and the pathology. This revealed a low-grade ductal carcinoma in situ, ER/PR positive. She is here today for surgical recommendations.  Past Medical History  Diagnosis Date  . Fibromyalgia   . Dyslipidemia   . History of chest pain   . Achalasia, esophageal     history of  . History of dizziness   . History of fatigue   . Hypertension   . Hemorrhoids   . Arthritis   . Diverticulitis   . Kidney problem     spot on kidney, pt  says being elevated in April 2013  . Sleep apnea   . Acid reflux    Past Surgical History  Procedure Date  . Rectocele repair   . Total abdominal hysterectomy   . Esophageal dilation   . Cholecystectomy 2007   Current Outpatient Prescriptions  Medication Sig Dispense Refill  . acetaminophen (TYLENOL) 500 MG tablet Take 500 mg by mouth every 6 (six) hours as needed. For arthritis      . beta carotene w/minerals (OCUVITE) tablet Take 1 tablet by mouth 2 (two) times daily.       . calcium citrate-vitamin D (CITRACAL+D) 315-200 MG-UNIT per tablet Take 1 tablet by mouth 2 (two) times daily.        Marland Kitchen diltiazem (DILACOR XR) 240 MG 24 hr capsule Take 240 mg by mouth daily.       . diphenhydrAMINE (BENADRYL) 25 mg capsule Take  25 mg by mouth daily as needed. For allergies      . losartan-hydrochlorothiazide (HYZAAR) 100-25 MG per tablet Take 1 tablet by mouth daily.        . sertraline (ZOLOFT) 100 MG tablet Take 100 mg by mouth at bedtime.       . vitamin B-12 (CYANOCOBALAMIN) 1000 MCG tablet Take 1,000 mcg by mouth daily.       Allergies  Allergen Reactions  . Penicillins Swelling and Rash  . Statins   . Sulfa Drugs Cross Reactors Shortness Of Breath  . Tramadol Hcl Anaphylaxis  . Aspirin Swelling  . Codeine Other (See Comments)    unknown   History  Substance Use Topics  . Smoking status: Never Smoker   . Smokeless tobacco: Never Used  . Alcohol Use: No     Review of Systems  Constitutional: Negative for fever and unexpected weight change.  Respiratory: Negative.   Cardiovascular: Negative.   Gastrointestinal: Negative.   Genitourinary: Negative.   Musculoskeletal: Negative.        Objective:   Physical Exam General: Alert, well-developed Elderly Caucasian female, in no distress Skin: Warm and dry without rash or infection. HEENT: No palpable masses or thyromegaly. Sclera nonicteric. Pupils equal round and reactive. Oropharynx clear. Breasts: Slight bruising in the upper left breast from cornual  biopsy. No palpable masses in either breast. No skin changes or nipple inversion. Lymph nodes: No cervical, supraclavicular, or inguinal nodes palpable. Lungs: Breath sounds clear and equal without increased work of breathing Cardiovascular: Regular rate and rhythm without murmur. No JVD or edema. Peripheral pulses intact. Abdomen: Nondistended. Soft and nontender. No masses palpable. No organomegaly. No palpable hernias. Extremities: No edema or joint swelling or deformity. No chronic venous stasis changes. Neurologic: Alert and fully oriented. Gait normal.     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 I discussed  the findings in detail with the patient. I recommended proceeding with needle localized lumpectomy. We discussed the procedure in detail including its nature in recovery and risks of anesthetic complications, bleeding, infection, possible need for further surgery based on final pathology findings. I would suspect if we can get good margins she could avoid radiation.    Plan:     Needle localized left breast lumpectomy as an outpatient under general anesthesia

## 2012-07-05 ENCOUNTER — Encounter (HOSPITAL_BASED_OUTPATIENT_CLINIC_OR_DEPARTMENT_OTHER): Payer: Self-pay | Admitting: General Surgery

## 2012-07-10 ENCOUNTER — Telehealth (INDEPENDENT_AMBULATORY_CARE_PROVIDER_SITE_OTHER): Payer: Self-pay | Admitting: General Surgery

## 2012-07-10 ENCOUNTER — Other Ambulatory Visit (INDEPENDENT_AMBULATORY_CARE_PROVIDER_SITE_OTHER): Payer: Self-pay

## 2012-07-10 ENCOUNTER — Telehealth (INDEPENDENT_AMBULATORY_CARE_PROVIDER_SITE_OTHER): Payer: Self-pay

## 2012-07-10 DIAGNOSIS — C50919 Malignant neoplasm of unspecified site of unspecified female breast: Secondary | ICD-10-CM

## 2012-07-10 NOTE — Telephone Encounter (Signed)
Called pt and discussed pathology

## 2012-07-10 NOTE — Telephone Encounter (Signed)
Patient calling for pathology results. 

## 2012-07-13 ENCOUNTER — Telehealth (INDEPENDENT_AMBULATORY_CARE_PROVIDER_SITE_OTHER): Payer: Self-pay | Admitting: General Surgery

## 2012-07-13 NOTE — Telephone Encounter (Signed)
Patient calling because she had a lumpectomy done and wanted to know if she could shower. She has no drains and incision is closed with glue. I made her aware that would be safe to shower. They also have not heard from the cancer center about her radiation appt. I made her aware the referral was placed on 07/10/12 and they will be contacting them. They will keep follow up appt with Kimberly Acevedo and call with any additional questions.

## 2012-07-14 ENCOUNTER — Telehealth: Payer: Self-pay | Admitting: *Deleted

## 2012-07-14 NOTE — Telephone Encounter (Signed)
Confirmed 07/24/12 appt w/ pt.  Mailed before appt letter & packet to pt.  Emailed Christy at [facility] to make aware.  Took paperwork to Med [facility] for chart.

## 2012-07-20 ENCOUNTER — Ambulatory Visit: Payer: Medicare Other

## 2012-07-20 ENCOUNTER — Ambulatory Visit: Payer: Medicare Other | Admitting: Radiation Oncology

## 2012-07-21 ENCOUNTER — Other Ambulatory Visit: Payer: Self-pay | Admitting: Emergency Medicine

## 2012-07-21 DIAGNOSIS — D0510 Intraductal carcinoma in situ of unspecified breast: Secondary | ICD-10-CM

## 2012-07-24 ENCOUNTER — Other Ambulatory Visit (HOSPITAL_BASED_OUTPATIENT_CLINIC_OR_DEPARTMENT_OTHER): Payer: Medicare Other | Admitting: Lab

## 2012-07-24 ENCOUNTER — Ambulatory Visit (HOSPITAL_BASED_OUTPATIENT_CLINIC_OR_DEPARTMENT_OTHER): Payer: Medicare Other | Admitting: Oncology

## 2012-07-24 ENCOUNTER — Encounter: Payer: Self-pay | Admitting: Oncology

## 2012-07-24 ENCOUNTER — Ambulatory Visit (HOSPITAL_BASED_OUTPATIENT_CLINIC_OR_DEPARTMENT_OTHER): Payer: Medicare Other

## 2012-07-24 VITALS — BP 138/64 | HR 69 | Temp 97.6°F | Resp 18 | Ht 68.0 in | Wt 165.3 lb

## 2012-07-24 DIAGNOSIS — C50219 Malignant neoplasm of upper-inner quadrant of unspecified female breast: Secondary | ICD-10-CM

## 2012-07-24 DIAGNOSIS — Z808 Family history of malignant neoplasm of other organs or systems: Secondary | ICD-10-CM

## 2012-07-24 DIAGNOSIS — I10 Essential (primary) hypertension: Secondary | ICD-10-CM

## 2012-07-24 DIAGNOSIS — Z170 Estrogen receptor positive status [ER+]: Secondary | ICD-10-CM

## 2012-07-24 DIAGNOSIS — D0510 Intraductal carcinoma in situ of unspecified breast: Secondary | ICD-10-CM

## 2012-07-24 LAB — COMPREHENSIVE METABOLIC PANEL (CC13)
AST: 21 U/L (ref 5–34)
Albumin: 3.5 g/dL (ref 3.5–5.0)
BUN: 13.7 mg/dL (ref 7.0–26.0)
Calcium: 10.1 mg/dL (ref 8.4–10.4)
Chloride: 101 mEq/L (ref 98–107)
Glucose: 78 mg/dl (ref 70–99)
Potassium: 4.1 mEq/L (ref 3.5–5.1)

## 2012-07-24 LAB — CBC WITH DIFFERENTIAL/PLATELET
Basophils Absolute: 0 10*3/uL (ref 0.0–0.1)
EOS%: 1.6 % (ref 0.0–7.0)
Eosinophils Absolute: 0.1 10*3/uL (ref 0.0–0.5)
HGB: 15 g/dL (ref 11.6–15.9)
NEUT#: 3.8 10*3/uL (ref 1.5–6.5)
RDW: 12.5 % (ref 11.2–14.5)
lymph#: 2.7 10*3/uL (ref 0.9–3.3)

## 2012-07-24 MED ORDER — ANASTROZOLE 1 MG PO TABS: 1.0000 mg | ORAL_TABLET | Freq: Every day | ORAL | Status: DC

## 2012-07-24 NOTE — Patient Instructions (Addendum)
Proceed with your radiation oncology appointment  Bone density scan   arimidex 1mg  daily prescription sent to your pharmacy. Do not take it until Dr. Dayton Scrape decides about whether or not you need radiation.  Anastrozole tablets What is this medicine? ANASTROZOLE (an AS troe zole) is used to treat breast cancer in women who have gone through menopause. Some types of breast cancer depend on estrogen to grow, and this medicine can stop tumor growth by blocking estrogen production. This medicine may be used for other purposes; ask your health care provider or pharmacist if you have questions. What should I tell my health care provider before I take this medicine? They need to know if you have any of these conditions: -liver disease -an unusual or allergic reaction to anastrozole, other medicines, foods, dyes, or preservatives -pregnant or trying to get pregnant -breast-feeding How should I use this medicine? Take this medicine by mouth with a glass of water. Follow the directions on the prescription label. You can take this medicine with or without food. Take your doses at regular intervals. Do not take your medicine more often than directed. Do not stop taking except on the advice of your doctor or health care professional. Talk to your pediatrician regarding the use of this medicine in children. Special care may be needed. Overdosage: If you think you have taken too much of this medicine contact a poison control center or emergency room at once. NOTE: This medicine is only for you. Do not share this medicine with others. What if I miss a dose? If you miss a dose, take it as soon as you can. If it is almost time for your next dose, take only that dose. Do not take double or extra doses. What may interact with this medicine? Do not take this medicine with any of the following medications: -female hormones, like estrogens or progestins and birth control pills This medicine may also interact with  the following medications: -tamoxifen This list may not describe all possible interactions. Give your health care provider a list of all the medicines, herbs, non-prescription drugs, or dietary supplements you use. Also tell them if you smoke, drink alcohol, or use illegal drugs. Some items may interact with your medicine. What should I watch for while using this medicine? Visit your doctor or health care professional for regular checks on your progress. Let your doctor or health care professional know about any unusual vaginal bleeding. Do not treat yourself for diarrhea, nausea, vomiting or other side effects. Ask your doctor or health care professional for advice. What side effects may I notice from receiving this medicine? Side effects that you should report to your doctor or health care professional as soon as possible: -allergic reactions like skin rash, itching or hives, swelling of the face, lips, or tongue -any new or unusual symptoms -breathing problems -chest pain -leg pain or swelling -vomiting Side effects that usually do not require medical attention (report to your doctor or health care professional if they continue or are bothersome): -back or bone pain -cough, or throat infection -diarrhea or constipation -dizziness -headache -hot flashes -loss of appetite -nausea -sweating -weakness and tiredness -weight gain This list may not describe all possible side effects. Call your doctor for medical advice about side effects. You may report side effects to FDA at 1-800-FDA-1088. Where should I keep my medicine? Keep out of the reach of children. Store at room temperature between 20 and 25 degrees C (68 and 77 degrees F). Throw away  any unused medicine after the expiration date. NOTE: This sheet is a summary. It may not cover all possible information. If you have questions about this medicine, talk to your doctor, pharmacist, or health care provider.  2012, [employer]vier/Gold  Standard. (08/04/2007 4:31:52 PM)

## 2012-07-24 NOTE — Progress Notes (Signed)
Checked in new patient. No financial issues. The patient had her [facility] forms.

## 2012-07-24 NOTE — Progress Notes (Signed)
Kimberly Acevedo [id number] 1928-08-19 79 y.o. 07/24/2012 4:39 PM  CC  [name], MD [address] [place] [address] Dr. Glenna Fellows Dr. Chipper Herb  REASON FOR CONSULTATION:  79 year old female with stage I (T1 A. NX N0) invasive lobular carcinoma of the left breast in addition to microinvasive ductal carcinoma and DCIS. Diagnosed January 2014.  STAGE:   DCIS (ductal carcinoma in situ)   Primary site: Breast   Staging method: AJCC 7th Edition   Clinical: Stage 0 (Tis, N0, cM0)   Summary: Stage 0 (Tis, N0, cM0)  REFERRING PHYSICIAN: Dr. Orpha Bur Worth  HISTORY OF PRESENT ILLNESS:  Kimberly Acevedo is a 79 y.o. female.  Who underwent screening mammogram on 05/19/2012 and was felt to have a possible mass within the left breast. Ultrasound showed a 7 mm mass along the upper inner left breast. The mass measured 4 x 5 x 7 mm at 11:00 8 cm from the nipple no abnormal lymph nodes. She had a needle core biopsy performedon 06/15/2012 with diagnosis for DCIS that was ER/PR positive. She underwent a left partial mastectomy on 07/04/2012. She was found to have a 0.2 cm invasive lobular carcinoma along with LCIS. This also noted to be low grade DCIS measuring 0.7 cm with microinvasive grade 1 ductal carcinoma. The invasive carcinoma was less than 0.1 cm from the main inferior margin. Tumor was ER positive PR positive. Patient is seen in medical oncology for consideration of adjuvant therapy.postoperatively she is doing well.   Past Medical History: Past Medical History  Diagnosis Date  . Fibromyalgia   . Dyslipidemia   . History of chest pain   . Achalasia, esophageal     history of  . History of dizziness   . History of fatigue   . Hypertension   . Hemorrhoids   . Arthritis   . Diverticulitis   . Kidney problem     spot on kidney, pt  says being elevated in April 2013  . Acid reflux   . Sleep apnea     uses a cpap    Past Surgical  History: Past Surgical History  Procedure Laterality Date  . Rectocele repair    . Total abdominal hysterectomy    . Esophageal dilation    . Cholecystectomy  2007  . Colonoscopy    . Breast lumpectomy with needle localization  07/04/2012    Procedure: BREAST LUMPECTOMY WITH NEEDLE LOCALIZATION;  Surgeon: [name], MD;  Location: [facility];  Service: General;  Laterality: Left;  left    Family History: Family History  Problem Relation Age of Onset  . Cancer Mother 4    melanoma ca  . Arthritis Father     Social History History  Substance Use Topics  . Smoking status: Never Smoker   . Smokeless tobacco: Never Used  . Alcohol Use: No    Allergies: Allergies  Allergen Reactions  . Penicillins Swelling and Rash  . Statins   . Sulfa Drugs Cross Reactors Shortness Of Breath  . Tramadol Hcl Anaphylaxis  . Aspirin Swelling  . Codeine Other (See Comments)    unknown    Current Medications: Current Outpatient Prescriptions  Medication Sig Dispense Refill  . acetaminophen (TYLENOL) 500 MG tablet Take 500 mg by mouth every 6 (six) hours as needed. For arthritis      . calcium citrate-vitamin D (CITRACAL+D) 315-200 MG-UNIT per tablet Take 1 tablet by mouth 2 (two) times daily.        Marland Kitchen  diltiazem (DILACOR XR) 240 MG 24 hr capsule Take 240 mg by mouth daily.       . sertraline (ZOLOFT) 100 MG tablet Take 100 mg by mouth at bedtime.       . valsartan-hydrochlorothiazide (DIOVAN-HCT) 320-25 MG per tablet Take by mouth daily. 320-25 mg 1 time daily      . diphenhydrAMINE (BENADRYL) 25 mg capsule Take 25 mg by mouth daily as needed. For allergies      . HYDROcodone-acetaminophen (NORCO/VICODIN) 5-325 MG per tablet Take 1-2 tablets by mouth every 6 (six) hours as needed for pain.  20 tablet  0  . losartan-hydrochlorothiazide (HYZAAR) 100-25 MG per tablet Take 1 tablet by mouth daily.        . vitamin B-12 (CYANOCOBALAMIN) 1000 MCG tablet Take 1,000 mcg by  mouth daily.       No current facility-administered medications for this visit.    OB/GYN History:menarche at age 40 she is postmenopausal no hormone replacement therapy first live birth at 69.  Fertility Discussion:not applicable Prior History of Cancer: no  Health Maintenance:  Colonoscopy yes Bone Density 2012 Last PAP smear unknown  ECOG PERFORMANCE STATUS: 0 - Asymptomatic  Genetic Counseling/testing:not required at this time  REVIEW OF SYSTEMS:  A comprehensive review of systems was negative.  PHYSICAL EXAMINATION: Blood pressure 138/64, pulse 69, temperature 97.6 F (36.4 C), temperature source Oral, resp. rate 18, height 5\' 8"  (1.727 m), weight 165 lb 4.8 oz (74.98 kg).  ZOX:WRUEA, healthy, no distress, well nourished and well developed SKIN: skin color, texture, turgor are normal HEAD: Normocephalic EYES: PERRLA, EOMI EARS: External ears normal OROPHARYNX:no exudate, no erythema and lips, buccal mucosa, and tongue normal  NECK: supple, no adenopathy LYMPH:  no palpable lymphadenopathy BREAST:right breast normal without mass, skin or nipple changes or axillary nodes, surgical scars noted on the left side well-healing LUNGS: clear to auscultation  HEART: regular rate & rhythm ABDOMEN:abdomen soft, non-tender, normal bowel sounds and no masses or organomegaly BACK: No CVA tenderness EXTREMITIES:no edema, no clubbing, no cyanosis  NEURO: alert & oriented x 3 with fluent speech, no focal motor/sensory deficits, gait normal     STUDIES/RESULTS: Mm Plc Breast Loc Dev   1st Lesion  Inc Mammo Guide  07/04/2012  *RADIOLOGY REPORT*  Clinical Data:  The patient presents for wire localization of the left breast for lumpectomy.  The patient has biopsy-proven DCIS in the left breast.  Biopsy clip placement at the time of biopsy was in satisfactory position.  NEEDLE LOCALIZATION WITH MAMMOGRAPHIC GUIDANCE AND SPECIMEN RADIOGRAPH  Comparison:  Recent prior studies  Patient  presents for needle localization prior to lumpectomy.  I met with the patient and we discussed the procedure of needle localization including benefits and alternatives. We discussed the high likelihood of a successful procedure. We discussed the risks of the procedure, including infection, bleeding, tissue injury, and further surgery. Informed, written consent was given.  Using mammographic guidance, sterile technique, 2% lidocaine and a 7 cm modified Kopans needle, a ribbon shaped biopsy clip with adjacent small nodule were localized using a medial to lateral approach.  The films are marked for Dr. Johna Sheriff.  Specimen radiograph was performed at [facility], and confirms the biopsy clip, a small nodular density, and an intact wire present in the tissue sample.  The specimen is marked for pathology.  IMPRESSION: Needle localization left breast.  No apparent complications.   Original Report Authenticated By: Britta Mccreedy, M.D.      LABS:  Chemistry      Component Value Date/Time   NA 140 07/24/2012 1458   NA 140 06/29/2012 1300   K 4.1 07/24/2012 1458   K 4.4 06/29/2012 1300   CL 101 07/24/2012 1458   CL 99 06/29/2012 1300   CO2 33* 07/24/2012 1458   CO2 31 06/29/2012 1300   BUN 13.7 07/24/2012 1458   BUN 11 06/29/2012 1300   CREATININE 0.8 07/24/2012 1458   CREATININE 0.69 06/29/2012 1300      Component Value Date/Time   CALCIUM 10.1 07/24/2012 1458   CALCIUM 9.7 06/29/2012 1300   ALKPHOS 68 07/24/2012 1458   ALKPHOS 62 08/30/2011 1717   AST 21 07/24/2012 1458   AST 29 08/30/2011 1717   ALT 18 07/24/2012 1458   ALT 17 08/30/2011 1717   BILITOT 0.29 07/24/2012 1458   BILITOT 0.4 08/30/2011 1717      Lab Results  Component Value Date   WBC 7.4 07/24/2012   HGB 15.0 07/24/2012   HCT 43.6 07/24/2012   MCV 92.6 07/24/2012   PLT 255 07/24/2012   PATHOLOGY: ADDITIONAL INFORMATION: 1. PROGNOSTIC INDICATORS - ACIS Results IMMUNOHISTOCHEMICAL AND MORPHOMETRIC ANALYSIS BY THE AUTOMATED CELLULAR IMAGING  SYSTEM (ACIS) Estrogen Receptor (Negative, <1%): 95%, STRONG STAINING INTENSITY Progesterone Receptor (Negative, <1%): 5%, STRONG STAINING INTENSITY Proliferation Marker Ki67 by M IB-1 (Low<20%): 10% All controls stained appropriately Kimberly Picket MD Pathologist, Electronic Signature ( Signed 07/13/2012) 1. CHROMOGENIC IN-SITU HYBRIDIZATION Interpretation HER-2/NEU BY CISH - NO AMPLIFICATION OF HER-2 DETECTED. THE RATIO OF HER-2: CEP 17 SIGNALS WAS 1.02. Reference range: Ratio: HER2:CEP17 < 1.8 - gene amplification not observed Ratio: HER2:CEP 17 1.8-2.2 - equivocal result Ratio: HER2:CEP17 > 2.2 - gene amplification observed Kimberly Picket MD Pathologist, Electronic Signature ( Signed 07/13/2012) 1 of 4 FINAL for JAZALYNN, MIRELES L [phone]) FINAL DIAGNOSIS Diagnosis 1. Breast, lumpectomy, Left - INVASIVE GRADE I LOBULAR CARCINOMA SPANNING 0.2 CM IN GREATEST DIMENSION WITH ASSOCIATED LOBULAR CARCINOMA IN SITU. - LOW GRADE DUCTAL CARCINOMA IN SITU, 0.7 CM IN GREATEST DIMENSION WITH ASSOCIATED MICROINVASIVE GRADE I DUCTAL CARCINOMA. - DEFINITIVE LYMPH/VASCULAR INVASION IS NOT IDENTIFIED. - INVASIVE LOBULAR CARCINOMA IS LESS THAN 0.1 CM FROM INFERIOR MARGIN. - OTHER MARGINS ARE NEGATIVE. - SEE ONCOLOGY TEMPLATE. 2. Breast, excision, left posterior margin - BENIGN FIBROFATTY SOFT TISSUE. - NO TUMOR SEEN. 3. Breast, excision, left lateral margin - BENIGN BREAST PARENCHYMA WITH FIBROCYSTIC CHANGES. - NO TUMOR SEEN. Microscopic Comment 1. BREAST, INVASIVE TUMOR, WITHOUT LYMPH NODE SAMPLING Specimen, including laterality: Left partial breast. Procedure: Left breast lumpectomy. Invasive Lobular Carcinoma: Grade: I. Tubule formation: 3. Nuclear pleomorphism: 1. Mitotic: 1. Microinvasive Ductal Carcinoma: Grade I. Tubule formation: 1. Nuclear pleomorphism: 1. Mitotic: 1. Tumor size (glass slide measurement): The invasive lobular carcinoma is 0.2 cm in greatest dimension. The  microinvasive ductal carcinoma is <0.2 cm in greatest dimension. Margins: Invasive, distance to closest margin: Invasive lobular carcinoma is less than 0.1 cm from inferior margin; invasive microinvasive ductal carcinoma is >0.3 cm from the margin. In-situ, distance to closest margin: Ductal carcinoma in situ is greater than 0.3 cm from the closest margin. Lymphovascular invasion: Not identified. Ductal carcinoma in situ: Present. Grade: Low grade. Extensive intraductal component: Yes, there is an extensive low grade DCIS associated with a small microinvasive ductal carcinoma. Lobular neoplasia: Yes, lobular carcinoma in situ present. Tumor focality: Multifocal, invasive lobular carcinoma and microinvasive ductal carcinoma present. Treatment effect: Not applicable. Extent of tumor: Tumor involves breast parenchyma in sections examined. Breast prognostic profile: Not  performed; a breast prognostic profile will be performed on the current invasive lobular carcinoma and reported in an addendum, see below comments. Non-neoplastic breast: Extensive fat necrosis and biopsy site changes present. TNM: mpT1a, pNX, MX. Comments: 1. A cytokeratin AE1/AE3 immunohistochemical stain and an E-cadherin stain are performed on two blocks (1A and 1C; four stains total). They both highlight a small focus of invasive lobular carcinoma, as evidenced by the positive cytokeratin AE1/AE3 staining and negative E-cadherin staining. The largest span 2 of 4 FINAL for BRYELLE, SPIEWAK ([id number]) Microscopic Comment(continued) of invasive lobular carcinoma is 0.2 cm by microscopy. As both foci are part of the same biopsy site, the invasive lobular carcinoma is considered to be a single focus spanning 0.2 cm in greatest dimension. A breast prognostic profile will be performed on the invasive lobular carcinoma and reported in an addendum. Dr. Colonel Bald has seen the invasive lobular carcinoma in consultation with agreement  to primary tumor type and margin status (0.1 cm from inferior margin). 2. The microinvasive grade I ductal carcinoma associated with the low grade ductal carcinoma in situ is small and the amount of tumor is insufficient to perform breast prognostic profile studies. Dr. Colonel Bald is in a agreement that there is a microinvasive grade I ductal carcinoma present associated with the low grade ductal carcinoma in situ. 3. In the superior margin section there is malignant ductal epithelium embedded in fibrosis which is 0.5 cm from the superior margin. This malignant epithelium is favored to be ductal carcinoma in situ which has been misplaced in the fibrosis of the previous biopsy site tract and not true invasive ductal carcinoma. Immunohistochemical stains are not performed, as they are not helpful in this distinction. Regardless, the malignant epithelium is ductal in nature and is away (0.5 cm) from the the superior margin. Dr. Colonel Bald has seen the misplaced malignant ductal epithelium in consultation with essential agreement of the above comment. (RAH:eps 07/06/12)  ASSESSMENT    79 year old female with  #1 new diagnosis of stage I (T1 A. NX M0) invasive lobular/LCIS and in addition to microinvasive ductal carcinoma/DCIS. Postoperatively patient is doing well. She is a good candidate for antiestrogen therapy and we discussed this as well. I do think that she needs a consultation with radiation oncology and I will refer her to Dr. Chipper Herb. Certainly she can get radiation therapy initially. I do think that she would benefit from radiation for local control perspective. She could obtain same with adjuvant therapy with antiestrogen therapy however I do worry about side effects from the adjuvant antiestrogen therapy. So I do think that opinion from Dr. Dayton Scrape is needed.  Clinical Trial Eligibility:no Multidisciplinary conference discussionyes     PLAN:    #1 refer to Dr. Chipper Herb.  #2 we will  obtain bone density scan in case she does decide to go on aromatase inhibitor.  #3 I will see her back after completion of radiation.        Discussion: Patient is being treated per NCCN breast cancer care guidelines appropriate for stage.1   Thank you so much for allowing me to participate in the care of [name]. I will continue to follow up the patient with you and assist in her care.  All questions were answered. The patient knows to call the clinic with any problems, questions or concerns. We can certainly see the patient much sooner if necessary.  I spent 60 minutes counseling the patient face to face. The total time spent in  the appointment was 60 minutes.   Drue Second, MD Medical/Oncology [facility] [phone] (beeper) [phone] (Office)  07/24/2012, 4:40 PM

## 2012-07-25 ENCOUNTER — Encounter (INDEPENDENT_AMBULATORY_CARE_PROVIDER_SITE_OTHER): Payer: Self-pay | Admitting: General Surgery

## 2012-07-25 ENCOUNTER — Ambulatory Visit (INDEPENDENT_AMBULATORY_CARE_PROVIDER_SITE_OTHER): Payer: Medicare Other | Admitting: General Surgery

## 2012-07-25 VITALS — BP 128/74 | HR 61 | Temp 98.2°F | Resp 18 | Ht 68.0 in | Wt 165.0 lb

## 2012-07-25 DIAGNOSIS — C50919 Malignant neoplasm of unspecified site of unspecified female breast: Secondary | ICD-10-CM

## 2012-07-25 NOTE — Progress Notes (Signed)
History: This returns for her first postop visit after left breast lumpectomy with preop diagnosis of DCIS by core biopsy. Final pathology revealed ER/PR positive DCIS with negative margins and what is likely an incidental 2 mm invasive lobular carcinoma which was within 1 mm but 8 negative. She reports she is doing well as far as her lumpectomy.  Exam: BP 128/74  Pulse 61  Temp(Src) 98.2 F (36.8 C)  Resp 18  Ht 5\' 8"  (1.727 m)  Wt 165 lb (74.844 kg)  BMI 25.09 kg/m2  Gen. Appears well Breasts left breast incision healing very nicely without complication  Assessment plan: Status post lumpectomy for DCIS probably incidental small invasive lobular carcinoma. Her margins are technically negative I do not believe reexcision as necessary. I discussed this with Dr. Dayton Scrape who is in agreement. She has seen medical oncology and will start Arimidex. Radiation may well be indicated and she has an appointment to see Dr. Dayton Scrape. I will see her back in 6 months.

## 2012-07-27 ENCOUNTER — Encounter: Payer: Self-pay | Admitting: Radiation Oncology

## 2012-07-27 ENCOUNTER — Ambulatory Visit
Admission: RE | Admit: 2012-07-27 | Discharge: 2012-07-27 | Disposition: A | Discharge status: Home / Self Care | Payer: Medicare Other | Source: Ambulatory Visit | Attending: Radiation Oncology | Admitting: Radiation Oncology

## 2012-07-27 VITALS — BP 158/74 | HR 76 | Temp 98.1°F | Wt 166.1 lb

## 2012-07-27 DIAGNOSIS — Z79899 Other long term (current) drug therapy: Secondary | ICD-10-CM | POA: Insufficient documentation

## 2012-07-27 DIAGNOSIS — I10 Essential (primary) hypertension: Secondary | ICD-10-CM | POA: Insufficient documentation

## 2012-07-27 DIAGNOSIS — C50919 Malignant neoplasm of unspecified site of unspecified female breast: Secondary | ICD-10-CM

## 2012-07-27 NOTE — Progress Notes (Signed)
Patient and sister here for radiation consultation of left breast dcis. Surgical margins negative for disease. ER+PR+. Patient and sister not in agreement as to what patient is capable of doing in regard to driving and taking care of self.Sister has been providing meals and states she will be able to bring to some appointments but not all as she has a spouse at home  With some disabilities. May need social worker to intervene and address patient needs, but she is not ready  Currently.

## 2012-07-27 NOTE — Addendum Note (Signed)
Encounter addended by: Ayad Nieman Marie Jaivian Battaglini, RN on: 07/27/2012  5:48 PM<BR>     Documentation filed: Charges VN

## 2012-07-27 NOTE — Progress Notes (Signed)
Please see the Nurse Progress Note in the MD Initial Consult Encounter for this patient. 

## 2012-07-27 NOTE — Progress Notes (Signed)
[facility] Radiation Oncology NEW PATIENT EVALUATION  Name: Kimberly Acevedo MRN: [id number]  Date:   07/27/2012           DOB: 10/15/1928  Status: outpatient   CC: [name], MD  Hoxworth, Lorne Skeens, MD    REFERRING PHYSICIAN: Johna Sheriff Lorne Skeens, MD   DIAGNOSIS:  Stage I (T1a NX M0) invasive lobular carcinoma of the left breast in addition to microinvasive ductal carcinoma and DCIS.  HISTORY OF PRESENT ILLNESS:  Kimberly Acevedo is a 79 y.o. female who is seen today for the courtesy of Dr. Johna Sheriff and Dr. Welton Flakes for evaluation of her T1a invasive carcinoma and microinvasive ductal carcinoma/DCIS of the left breast. At the time of a screening mammogram on 05/19/2012 she was felt to have a possible mass within the left breast. Additional views with ultrasound showed a 7 mm mass along the upper inner left breast. On ultrasound this mass measured 4 x 5 x 7 mm at 11:00, 8 cm from the nipple. There were no abnormal lymph nodes seen on ultrasound. She underwent a needle core biopsy on 06/15/2012 which is diagnostic for DCIS which was strongly ER/PR positive. Dr. Jaclynn Guarneri perform a left partial mastectomy on 07/04/2012. She was then have a 0.2 cm invasive lobular carcinoma along with LCIS a. In addition she was found have low-grade DCIS measuring 0.7 cm with microinvasive grade 1 ductal carcinoma. The invasive carcinoma was less than 0.1 cm from the inferior margin. The invasive carcinoma was ER positive at 95% and PR positive at 5%. She is doing well postoperatively. She was seen by Dr. Welton Flakes who did offer her adjuvant hormone therapy.  PREVIOUS RADIATION THERAPY: No   PAST MEDICAL HISTORY:  has a past medical history of Fibromyalgia; Dyslipidemia; History of chest pain; Achalasia, esophageal; History of dizziness; History of fatigue; Hypertension; Hemorrhoids; Arthritis; Diverticulitis; Kidney problem; Acid reflux; and Sleep apnea.     PAST SURGICAL HISTORY:  Past  Surgical History  Procedure Laterality Date  . Rectocele repair    . Total abdominal hysterectomy    . Esophageal dilation    . Cholecystectomy  2007  . Colonoscopy    . Breast lumpectomy with needle localization  07/04/2012    Procedure: BREAST LUMPECTOMY WITH NEEDLE LOCALIZATION;  Surgeon: [name], MD;  Location: [facility];  Service: General;  Laterality: Left;  left  . Varicose vein surgery  2010    left lower extremity     FAMILY HISTORY: family history includes Arthritis in her father and Cancer (age of onset: 44) in her mother. Her mother died from metastatic malignant melanoma 3. Her father died from pancreatic cancer at 49.   SOCIAL HISTORY:  reports that she has never smoked. She has never used smokeless tobacco. She reports that she does not drink alcohol or use illicit drugs. Would've for the past year, one daughter. 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   ALLERGIES: Penicillins; Statins; Sulfa drugs cross reactors; Tramadol hcl; Aspirin; and Codeine   MEDICATIONS:  Current Outpatient Prescriptions  Medication Sig Dispense Refill  . acetaminophen (TYLENOL) 500 MG tablet Take 500 mg by mouth every 6 (six) hours as needed. For arthritis      . calcium citrate-vitamin D (CITRACAL+D) 315-200 MG-UNIT per tablet Take 1 tablet by mouth 2 (two) times daily.        Marland Kitchen diltiazem (DILACOR XR) 240 MG 24 hr capsule Take 240  mg by mouth daily.       . diphenhydrAMINE (BENADRYL) 25 mg capsule Take 25 mg by mouth daily as needed. For allergies      . losartan-hydrochlorothiazide (HYZAAR) 100-25 MG per tablet Take 1 tablet by mouth daily.        . sertraline (ZOLOFT) 100 MG tablet Take 100 mg by mouth at bedtime.       . vitamin B-12 (CYANOCOBALAMIN) 1000 MCG tablet Take 1,000 mcg by mouth daily.      Marland Kitchen anastrozole (ARIMIDEX) 1 MG tablet Take 1 tablet (1 mg total) by mouth daily.  90 tablet  12   No current  facility-administered medications for this encounter.     REVIEW OF SYSTEMS:  Pertinent items are noted in HPI.    PHYSICAL EXAM:  weight is 166 lb 1.6 oz (75.342 kg). Her temperature is 98.1 F (36.7 C). Her blood pressure is 158/74 and her pulse is 76.   Alert and oriented 79 year old white female appearing younger than his stated age. Head and neck examination: Grossly unremarkable. Nodes: Without palpable cervical, supraclavicular, or axillary lymphadenopathy. Chest: Lungs clear. Heart: Regular in rhythm. Back: Without spinal or CVA tenderness. Breasts: There is a partial mastectomy wound along the upper aspect of the left breast extending from 10 to 11:00. The wound is healing well. No masses are appreciated. Right breast without masses or lesions. Abdomen without hepatomegaly. Extremities: Without upper extremity lymphedema.   LABORATORY DATA:  Lab Results  Component Value Date   WBC 7.4 07/24/2012   HGB 15.0 07/24/2012   HCT 43.6 07/24/2012   MCV 92.6 07/24/2012   PLT 255 07/24/2012   Lab Results  Component Value Date   NA 140 07/24/2012   K 4.1 07/24/2012   CL 101 07/24/2012   CO2 33* 07/24/2012   Lab Results  Component Value Date   ALT 18 07/24/2012   AST 21 07/24/2012   ALKPHOS 68 07/24/2012   BILITOT 0.29 07/24/2012      IMPRESSION: Pathologic stage I (T1a NX M0) invasive lobular/LCIS in addition to microinvasive ductal carcinoma/DCIS. Her surgical margins are adequate and this is supported by the consensus guideline recently published in Annals of Surgical Oncology in that no ink was seen on the invasive carcinoma. From a local control standpoint, her management options include mastectomy, or partial mastectomy with or without adjuvant radiation therapy, and with or without adjuvant hormone therapy. We discussed the fact that no matter what we do this point in time, there would not be a clear survival benefit with any particular form of adjuvant therapy  or with completion  mastectomy. However, there is a difference in local control between observation, adjuvant radiation therapy alone, adjuvant hormone therapy alone, or combination adjuvant hormone therapy and radiation therapy. One has to factor in her excellent performance status despite her age of 18 and a history of longevity within her family. Radiation therapy may provide better local control than adjuvant hormone therapy alone, and I do not feel that she needs both adjuvant radiation therapy and hormone therapy. She would be a candidate for hypo-fractionated treatment over 3-1/2-4 weeks. I discussed her care with Dr. Welton Flakes, and we realize that she may avoid potential toxicities of an aromatase inhibitor or tamoxifen at her age by proceeding with radiation therapy alone. The patient will like to avoid adjuvant hormone therapy if possible. She drives her car, and feels comfortable coming in for hypo-fractionated radiation therapy. We discussed the potential acute and late toxicities of  radiation therapy, and consent is signed today. She will be considered for deep inspiration breath-hold technology to avoid cardiac irradiation.   PLAN: As discussed above. She will keep her appointment for her bone density which is scheduled for February 28. We will proceed with treatment planning next week.  I spent 60 minutes minutes face to face with the patient and more than 50% of that time was spent in counseling and/or coordination of care.

## 2012-07-28 NOTE — Addendum Note (Signed)
Encounter addended by: Sujata Maines Mintz Elan Mcelvain, RN on: 07/28/2012  2:20 PM<BR>     Documentation filed: Charges VN

## 2012-07-28 NOTE — Addendum Note (Signed)
Encounter addended by: Delynn Flavin, RN on: 07/28/2012  2:21 PM<BR>     Documentation filed: Charges VN

## 2012-08-01 ENCOUNTER — Telehealth: Payer: Self-pay | Admitting: Radiation Oncology

## 2012-08-01 ENCOUNTER — Ambulatory Visit
Admission: RE | Admit: 2012-08-01 | Discharge: 2012-08-01 | Disposition: A | Discharge status: Home / Self Care | Payer: Medicare Other | Source: Ambulatory Visit | Attending: Radiation Oncology | Admitting: Radiation Oncology

## 2012-08-01 DIAGNOSIS — C50919 Malignant neoplasm of unspecified site of unspecified female breast: Secondary | ICD-10-CM | POA: Insufficient documentation

## 2012-08-01 DIAGNOSIS — L589 Radiodermatitis, unspecified: Secondary | ICD-10-CM | POA: Insufficient documentation

## 2012-08-01 DIAGNOSIS — L539 Erythematous condition, unspecified: Secondary | ICD-10-CM | POA: Insufficient documentation

## 2012-08-01 DIAGNOSIS — Y842 Radiological procedure and radiotherapy as the cause of abnormal reaction of the patient, or of later complication, without mention of misadventure at the time of the procedure: Secondary | ICD-10-CM | POA: Insufficient documentation

## 2012-08-01 DIAGNOSIS — L299 Pruritus, unspecified: Secondary | ICD-10-CM | POA: Insufficient documentation

## 2012-08-01 DIAGNOSIS — Z510 Encounter for antineoplastic radiation therapy: Secondary | ICD-10-CM | POA: Insufficient documentation

## 2012-08-01 DIAGNOSIS — C50912 Malignant neoplasm of unspecified site of left female breast: Secondary | ICD-10-CM

## 2012-08-01 NOTE — Progress Notes (Signed)
Simulation/treatment planning note: The patient was placed on a custom breast board and a custom neck mold was constructed for immobilization. Her left breast border was marked with radiopaque wires. Her partial mastectomy scar was marked with a radiopaque wire. She was then scanned free breathing and it was not felt that the cardiac silhouette would be within her tangent fields, and thus she did not need deep inspiration/breath-hold technology. She was set up to medial and lateral left breast tangents after contouring her tumor bed. 2 sets of multileaf collimators were designed to conform the field. I prescribing 4250 cGy 17 sessions utilizing 6 MV photons. This be followed by electron beam boost for a further 750 cGy in 3 sessions utilizing 15 MEV electrons based on the depth of her tumor bed.

## 2012-08-01 NOTE — Telephone Encounter (Signed)
Met w patient to discuss RO billing. Pt had transportation concerns and advised she just needs someone to bring her and stay with her for support and money that, money would not be an issue at this time.  Referring pt to the onsite SW, as well as, ACS referral.  Dx:

## 2012-08-04 ENCOUNTER — Other Ambulatory Visit: Payer: Medicare Other

## 2012-08-07 ENCOUNTER — Encounter: Payer: Self-pay | Admitting: Radiation Oncology

## 2012-08-07 NOTE — Progress Notes (Signed)
3-D simulation note: The patient underwent 3-D simulation to complete her treatment planning for treatment to the left breast. Dose volume histograms were obtained for the target structures and also avoidance structures including the heart and lungs. We met our departmental goals. 2 sets of multileaf collimators were designed for her tangential fields with forward planning. I prescribing 4250 cGy in 17 sessions utilizing 6 MV photons.

## 2012-08-08 ENCOUNTER — Ambulatory Visit: Payer: Medicare Other | Admitting: Radiation Oncology

## 2012-08-09 ENCOUNTER — Other Ambulatory Visit: Payer: Medicare Other

## 2012-08-09 ENCOUNTER — Ambulatory Visit
Admission: RE | Admit: 2012-08-09 | Discharge: 2012-08-09 | Disposition: A | Discharge status: Home / Self Care | Payer: Medicare Other | Source: Ambulatory Visit | Attending: Radiation Oncology | Admitting: Radiation Oncology

## 2012-08-10 ENCOUNTER — Encounter: Payer: Self-pay | Admitting: Radiation Oncology

## 2012-08-10 ENCOUNTER — Ambulatory Visit
Admission: RE | Admit: 2012-08-10 | Discharge: 2012-08-10 | Disposition: A | Discharge status: Home / Self Care | Payer: Medicare Other | Source: Ambulatory Visit | Attending: Radiation Oncology | Admitting: Radiation Oncology

## 2012-08-10 NOTE — Progress Notes (Signed)
Simulation verification note: On March 5 the patient underwent simulation verification for treatment to her left breast. Her isocenter was in good position and the multileaf collimators contoured the treatment volume appropriately.

## 2012-08-11 ENCOUNTER — Ambulatory Visit: Payer: Medicare Other

## 2012-08-14 ENCOUNTER — Encounter: Payer: Self-pay | Admitting: Radiation Oncology

## 2012-08-14 ENCOUNTER — Ambulatory Visit
Admission: RE | Admit: 2012-08-14 | Discharge: 2012-08-14 | Disposition: A | Discharge status: Home / Self Care | Payer: Medicare Other | Source: Ambulatory Visit | Attending: Radiation Oncology | Admitting: Radiation Oncology

## 2012-08-14 VITALS — BP 140/55 | HR 63 | Temp 98.1°F | Wt 166.3 lb

## 2012-08-14 DIAGNOSIS — C50912 Malignant neoplasm of unspecified site of left female breast: Secondary | ICD-10-CM

## 2012-08-14 MED ORDER — ALRA NON-METALLIC DEODORANT (RAD-ONC)
1.0000 "application " | Freq: Once | TOPICAL | Status: AC
  Administered 2012-08-14: 1 via TOPICAL

## 2012-08-14 MED ORDER — RADIAPLEXRX EX GEL
Freq: Once | CUTANEOUS | Status: AC
  Administered 2012-08-14: 1 via TOPICAL

## 2012-08-14 NOTE — Progress Notes (Signed)
Kimberly Acevedo also has a question as to whether she should be taking the Arimidex.

## 2012-08-14 NOTE — Progress Notes (Signed)
Weekly Management Note:  Site: Left breast Current Dose:  500  cGy Projected Dose: 4250  cGy followed by a boost of 750 cGy in 3 sessions  Narrative: The patient is seen today for routine under treatment assessment. CBCT/MVCT images/port films were reviewed. The chart was reviewed.   No complaints today.  Physical Examination:  Filed Vitals:   08/14/12 1404  BP: 140/55  Pulse: 63  Temp: 98.1 F (36.7 C)  .  Weight: 166 lb 4.8 oz (75.433 kg). No skin changes.  Impression: Tolerating radiation therapy well. She'll inquire as to whether not she should take Arimidex, and I told her that radiation therapy was taken the place of Arimidex.  Plan: Continue radiation therapy as planned.

## 2012-08-14 NOTE — Addendum Note (Signed)
Encounter addended by: Eduardo Osier, RN on: 08/14/2012  2:45 PM<BR>     Documentation filed: Visit Diagnoses

## 2012-08-14 NOTE — Progress Notes (Signed)
Kimberly Acevedo here for weekly ut visit.  She has had 2/17 fractions to her left breast.  She does have arthritis pain in her shoulders that she rates at a 4 on a 0/10 scale.  The skin on her left breast is intact.  She denies fatigue at this time.  She was given the Radiation Treatment and you booklet and educated on skin care, fatigue and following a high protein diet.  She was given and instructed on Radiaplex gel and Alra Deoderant.

## 2012-08-15 ENCOUNTER — Ambulatory Visit
Admission: RE | Admit: 2012-08-15 | Discharge: 2012-08-15 | Disposition: A | Discharge status: Home / Self Care | Payer: Medicare Other | Source: Ambulatory Visit | Attending: Radiation Oncology | Admitting: Radiation Oncology

## 2012-08-16 ENCOUNTER — Ambulatory Visit
Admission: RE | Admit: 2012-08-16 | Discharge: 2012-08-16 | Disposition: A | Discharge status: Home / Self Care | Payer: Medicare Other | Source: Ambulatory Visit | Attending: Radiation Oncology | Admitting: Radiation Oncology

## 2012-08-17 ENCOUNTER — Ambulatory Visit
Admission: RE | Admit: 2012-08-17 | Discharge: 2012-08-17 | Disposition: A | Discharge status: Home / Self Care | Payer: Medicare Other | Source: Ambulatory Visit | Attending: Radiation Oncology | Admitting: Radiation Oncology

## 2012-08-18 ENCOUNTER — Ambulatory Visit
Admission: RE | Admit: 2012-08-18 | Discharge: 2012-08-18 | Disposition: A | Discharge status: Home / Self Care | Payer: Medicare Other | Source: Ambulatory Visit | Attending: Radiation Oncology | Admitting: Radiation Oncology

## 2012-08-21 ENCOUNTER — Ambulatory Visit
Admission: RE | Admit: 2012-08-21 | Discharge: 2012-08-21 | Disposition: A | Discharge status: Home / Self Care | Payer: Medicare Other | Source: Ambulatory Visit | Attending: Radiation Oncology | Admitting: Radiation Oncology

## 2012-08-21 ENCOUNTER — Other Ambulatory Visit: Payer: Self-pay | Admitting: Emergency Medicine

## 2012-08-21 ENCOUNTER — Encounter: Payer: Self-pay | Admitting: Radiation Oncology

## 2012-08-21 VITALS — BP 101/68 | HR 98 | Temp 97.5°F | Resp 20 | Wt 162.6 lb

## 2012-08-21 DIAGNOSIS — C50919 Malignant neoplasm of unspecified site of unspecified female breast: Secondary | ICD-10-CM

## 2012-08-21 DIAGNOSIS — C50912 Malignant neoplasm of unspecified site of left female breast: Secondary | ICD-10-CM

## 2012-08-21 NOTE — Progress Notes (Signed)
   Weekly Management Note:  outpatient Current Dose:  17.5 Gy  Projected Dose: 42.5 Gy  + boost  Narrative:  The patient presents for routine under treatment assessment.  CBCT/MVCT images/Port film x-rays were reviewed.  The chart was checked. Tired.  Physical Findings:  weight is 162 lb 9.6 oz (73.755 kg). Her oral temperature is 97.5 F (36.4 C). Her blood pressure is 101/68 and her pulse is 98. Her respiration is 20.  Minimal skin change over left breast - a little erythematous  Impression:  The patient is tolerating radiotherapy.  Plan:  Continue radiotherapy as planned.  ________________________________   Lonie Peak, M.D.

## 2012-08-21 NOTE — Progress Notes (Signed)
Pt denies pain, fatigue, loss of appetite. Applying Radiaplex to left breast tx area. Gave pt phone # to rad onc nursing desk due to weather.

## 2012-08-22 ENCOUNTER — Other Ambulatory Visit: Payer: Medicare Other | Admitting: Lab

## 2012-08-22 ENCOUNTER — Ambulatory Visit: Payer: Medicare Other | Admitting: Oncology

## 2012-08-22 ENCOUNTER — Ambulatory Visit: Payer: Medicare Other

## 2012-08-23 ENCOUNTER — Ambulatory Visit
Admission: RE | Admit: 2012-08-23 | Discharge: 2012-08-23 | Disposition: A | Discharge status: Home / Self Care | Payer: Medicare Other | Source: Ambulatory Visit | Attending: Radiation Oncology | Admitting: Radiation Oncology

## 2012-08-23 ENCOUNTER — Telehealth: Payer: Self-pay | Admitting: Oncology

## 2012-08-23 NOTE — Telephone Encounter (Signed)
Returned pt's call re r/s 3/18 appt due to weather. Pt aware that message has been sent to [facility] for new d/t.

## 2012-08-24 ENCOUNTER — Ambulatory Visit
Admission: RE | Admit: 2012-08-24 | Discharge: 2012-08-24 | Disposition: A | Discharge status: Home / Self Care | Payer: Medicare Other | Source: Ambulatory Visit | Attending: Radiation Oncology | Admitting: Radiation Oncology

## 2012-08-25 ENCOUNTER — Ambulatory Visit
Admission: RE | Admit: 2012-08-25 | Discharge: 2012-08-25 | Disposition: A | Discharge status: Home / Self Care | Payer: Medicare Other | Source: Ambulatory Visit | Attending: Radiation Oncology | Admitting: Radiation Oncology

## 2012-08-28 ENCOUNTER — Ambulatory Visit
Admission: RE | Admit: 2012-08-28 | Discharge: 2012-08-28 | Disposition: A | Discharge status: Home / Self Care | Payer: Medicare Other | Source: Ambulatory Visit | Attending: Radiation Oncology | Admitting: Radiation Oncology

## 2012-08-28 ENCOUNTER — Encounter: Payer: Self-pay | Admitting: Radiation Oncology

## 2012-08-28 VITALS — BP 130/52 | HR 61 | Temp 98.5°F | Resp 20 | Wt 167.7 lb

## 2012-08-28 DIAGNOSIS — C50912 Malignant neoplasm of unspecified site of left female breast: Secondary | ICD-10-CM

## 2012-08-28 NOTE — Progress Notes (Signed)
Pt's son-in-law w/her today. Pt denies pain, fatigue, loss of appetite. She is applying Radiaplex to left breast tx area. She denies skin pigment changes at this time.

## 2012-08-28 NOTE — Progress Notes (Signed)
Electron beam simulation note: With [employer], Ms. Lovett underwent electron beam simulation for her left breast boost. She was set up en face. One custom block was constructed to conform the field. A special port was requested. I am prescribing 750 cGy 3 sessions utilizing 15 MEV electrons. Electron beam energy was chosen based on the depth of her tumor bed as seen on her CT scan.

## 2012-08-28 NOTE — Progress Notes (Signed)
Weekly Management Note:  Site: Left breast Current Dose:  2750  cGy Projected Dose: 4250  cGy followed by boost of 750 cGy in 3 sessions  Narrative: The patient is seen today for routine under treatment assessment. CBCT/MVCT images/port films were reviewed. The chart was reviewed.   No complaints today. She uses Radioplex gel.  Physical Examination:  Filed Vitals:   08/28/12 1416  BP: 130/52  Pulse: 61  Temp: 98.5 F (36.9 C)  Resp: 20  .  Weight: 167 lb 11.2 oz (76.068 kg). No significant skin changes.  Impression: Tolerating radiation therapy well.  Plan: Continue radiation therapy as planned.

## 2012-08-29 ENCOUNTER — Telehealth: Payer: Self-pay | Admitting: Oncology

## 2012-08-29 ENCOUNTER — Ambulatory Visit (HOSPITAL_BASED_OUTPATIENT_CLINIC_OR_DEPARTMENT_OTHER): Payer: Medicare Other | Admitting: Adult Health

## 2012-08-29 ENCOUNTER — Ambulatory Visit
Admission: RE | Admit: 2012-08-29 | Discharge: 2012-08-29 | Disposition: A | Discharge status: Home / Self Care | Payer: Medicare Other | Source: Ambulatory Visit | Attending: Radiation Oncology | Admitting: Radiation Oncology

## 2012-08-29 ENCOUNTER — Other Ambulatory Visit (HOSPITAL_BASED_OUTPATIENT_CLINIC_OR_DEPARTMENT_OTHER): Payer: Medicare Other | Admitting: Lab

## 2012-08-29 VITALS — BP 139/74 | HR 63 | Temp 98.3°F | Resp 20 | Ht 68.0 in | Wt 166.0 lb

## 2012-08-29 DIAGNOSIS — Z170 Estrogen receptor positive status [ER+]: Secondary | ICD-10-CM

## 2012-08-29 DIAGNOSIS — C50919 Malignant neoplasm of unspecified site of unspecified female breast: Secondary | ICD-10-CM

## 2012-08-29 DIAGNOSIS — C50219 Malignant neoplasm of upper-inner quadrant of unspecified female breast: Secondary | ICD-10-CM

## 2012-08-29 LAB — CBC WITH DIFFERENTIAL/PLATELET
BASO%: 0.7 % (ref 0.0–2.0)
Basophils Absolute: 0 10e3/uL (ref 0.0–0.1)
EOS%: 1.5 % (ref 0.0–7.0)
Eosinophils Absolute: 0.1 10e3/uL (ref 0.0–0.5)
HCT: 41.2 % (ref 34.8–46.6)
HGB: 14.3 g/dL (ref 11.6–15.9)
LYMPH%: 39.1 % (ref 14.0–49.7)
MCH: 32 pg (ref 25.1–34.0)
MCHC: 34.7 g/dL (ref 31.5–36.0)
MCV: 92.4 fL (ref 79.5–101.0)
MONO#: 0.7 10e3/uL (ref 0.1–0.9)
MONO%: 9.7 % (ref 0.0–14.0)
NEUT#: 3.4 10e3/uL (ref 1.5–6.5)
NEUT%: 49 % (ref 38.4–76.8)
Platelets: 204 10e3/uL (ref 145–400)
RBC: 4.46 10e6/uL (ref 3.70–5.45)
RDW: 12.3 % (ref 11.2–14.5)
WBC: 6.9 10e3/uL (ref 3.9–10.3)
lymph#: 2.7 10e3/uL (ref 0.9–3.3)

## 2012-08-29 LAB — COMPREHENSIVE METABOLIC PANEL (CC13)
ALT: 18 U/L (ref 0–55)
AST: 19 U/L (ref 5–34)
Albumin: 3.4 g/dL — ABNORMAL LOW (ref 3.5–5.0)
Alkaline Phosphatase: 70 U/L (ref 40–150)
BUN: 8.9 mg/dL (ref 7.0–26.0)
CO2: 31 meq/L — ABNORMAL HIGH (ref 22–29)
Calcium: 9.6 mg/dL (ref 8.4–10.4)
Chloride: 102 meq/L (ref 98–107)
Creatinine: 0.8 mg/dL (ref 0.6–1.1)
Glucose: 81 mg/dL (ref 70–99)
Potassium: 3.4 meq/L — ABNORMAL LOW (ref 3.5–5.1)
Sodium: 142 meq/L (ref 136–145)
Total Bilirubin: 0.32 mg/dL (ref 0.20–1.20)
Total Protein: 6.7 g/dL (ref 6.4–8.3)

## 2012-08-29 NOTE — Progress Notes (Signed)
OFFICE PROGRESS NOTE  CC**  Kimberly Moh, MD [address] [place] [address]  DIAGNOSIS: 79 year old female with stage I invasive lobular carcinoma of the left breast in addition to microinvasive ductal carcinoma and DCIS.   PRIOR THERAPY: 1. Patient diagnosed during screening mammo on 05/19/12 finding possible mass within left breast.  Ultrasound shoed a 7mm mass along the upper inner left breast.  The mass measured 4 x 5 x 7 mm at 11:00 8cm from the nipple, no abnormal lymph nodes.  Needle core biopsy on 06/15/12 showed DCIS ER/PR positive.    2. She underwent left partial mastectomy on 07/04/12.  She had a 0.2cm invasive lobular carcinoma along with LCIS. Also noted low grade DCIS 0.7cm with microinvasive grade 1 ductal carcinoma.  The invasive carcinoma was less than 0.1cm from the main inferior margin.  Tumor was ER PR positive.    CURRENT THERAPY: radiation therapy  INTERVAL HISTORY: Kimberly HELMINIAK 79 y.o. female returns for follow up during her radiation therapy.  She is tolerating treatment well and is using the radiaplex cream to help with the erythema on her skin.  She has a DEXA appt tomorrow.  She and Dr. Dayton Scrape discussed an aromatase inhibitor, and he recommended against it.    MEDICAL HISTORY: Past Medical History  Diagnosis Date  . Fibromyalgia   . Dyslipidemia   . History of chest pain   . Achalasia, esophageal     history of  . History of dizziness   . History of fatigue   . Hypertension   . Hemorrhoids   . Arthritis   . Diverticulitis   . Kidney problem     spot on kidney, pt  says being elevated in April 2013  . Acid reflux   . Sleep apnea     uses a cpap    ALLERGIES:  is allergic to penicillins; statins; sulfa drugs cross reactors; tramadol hcl; aspirin; and codeine.  MEDICATIONS:  Current Outpatient Prescriptions  Medication Sig Dispense Refill  . acetaminophen (TYLENOL) 500 MG tablet Take 500 mg by mouth every 6 (six)  hours as needed. For arthritis      . diltiazem (DILACOR XR) 240 MG 24 hr capsule Take 240 mg by mouth daily.       Kimberly Acevedo (HYZAAR) 100-25 MG per tablet Take 1 tablet by mouth daily.        . non-metallic deodorant Thornton Papas) MISC Apply 1 application topically daily as needed.      . sertraline (ZOLOFT) 100 MG tablet Take 100 mg by mouth at bedtime.        No current facility-administered medications for this visit.    SURGICAL HISTORY:  Past Surgical History  Procedure Laterality Date  . Rectocele repair    . Total abdominal hysterectomy    . Esophageal dilation    . Cholecystectomy  2007  . Colonoscopy    . Breast lumpectomy with needle localization  07/04/2012    Procedure: BREAST LUMPECTOMY WITH NEEDLE LOCALIZATION;  Surgeon: [name], MD;  Location: [facility];  Service: General;  Laterality: Left;  left  . Varicose vein surgery  2010    left lower extremity    REVIEW OF SYSTEMS:  General: fatigue (-), night sweats (-), fever (-), pain (-) Lymph: palpable nodes (-) HEENT: vision changes (-), mucositis (-), gum bleeding (-), epistaxis (-) Cardiovascular: chest pain (-), palpitations (-) Pulmonary: shortness of breath (-), dyspnea on exertion (-), cough (-),  hemoptysis (-) GI:  Early satiety (-), melena (-), dysphagia (-), nausea/vomiting (-), diarrhea (-) GU: dysuria (-), hematuria (-), incontinence (-) Musculoskeletal: joint swelling (-), joint pain (-), back pain (-) Neuro: weakness (-), numbness (-), headache (-), confusion (-) Skin: Rash (-), lesions (-), dryness (-) Psych: depression (-), suicidal/homicidal ideation (-), feeling of hopelessness (-)   HEALTH MAINTENANCE:  Mammogram 05/19/12 Colonoscopy 6 years ago Bone Density scheduled tomorrow Pap Smear s/p TAH Eye Exam 4/13 Vitamin D unknown Lipid Panel 08/2011  PHYSICAL EXAMINATION: Blood pressure 139/74, pulse 63, temperature 98.3 F (36.8 C), temperature source  Oral, resp. rate 20, height 5\' 8"  (1.727 m), weight 166 lb (75.297 kg). Body mass index is 25.25 kg/(m^2). General: Patient is a well appearing female in no acute distress HEENT: PERRLA, sclerae anicteric no conjunctival pallor, MMM Neck: supple, no palpable adenopathy Lungs: clear to auscultation bilaterally, no wheezes, rhonchi, or rales Cardiovascular: regular rate rhythm, S1, S2, no murmurs, rubs or gallops Abdomen: Soft, non-tender, non-distended, normoactive bowel sounds, no HSM Extremities: warm and well perfused, no clubbing, cyanosis, or edema Skin: No rashes or lesions Neuro: Non-focal Breasts:  ECOG PERFORMANCE STATUS: 1 - Symptomatic but completely ambulatory      LABORATORY DATA: Lab Results  Component Value Date   WBC 6.9 08/29/2012   HGB 14.3 08/29/2012   HCT 41.2 08/29/2012   MCV 92.4 08/29/2012   PLT 204 08/29/2012      Chemistry      Component Value Date/Time   NA 140 07/24/2012 1458   NA 140 06/29/2012 1300   K 4.1 07/24/2012 1458   K 4.4 06/29/2012 1300   CL 101 07/24/2012 1458   CL 99 06/29/2012 1300   CO2 33* 07/24/2012 1458   CO2 31 06/29/2012 1300   BUN 13.7 07/24/2012 1458   BUN 11 06/29/2012 1300   CREATININE 0.8 07/24/2012 1458   CREATININE 0.69 06/29/2012 1300      Component Value Date/Time   CALCIUM 10.1 07/24/2012 1458   CALCIUM 9.7 06/29/2012 1300   ALKPHOS 68 07/24/2012 1458   ALKPHOS 62 08/30/2011 1717   AST 21 07/24/2012 1458   AST 29 08/30/2011 1717   ALT 18 07/24/2012 1458   ALT 17 08/30/2011 1717   BILITOT 0.29 07/24/2012 1458   BILITOT 0.4 08/30/2011 1717       RADIOGRAPHIC STUDIES:  No results found.  ASSESSMENT: 79 year old female with  #1 new diagnosis of stage I (T1 A. NX M0) invasive lobular/LCIS and in addition to microinvasive ductal carcinoma/DCIS. Postoperatively patient is doing well. She is a good candidate for antiestrogen therapy and we discussed this as well. I do think that she needs a consultation with radiation oncology and I  will refer her to Dr. Chipper Herb. Certainly she can get radiation therapy initially. I do think that she would benefit from radiation for local control perspective. She could obtain same with adjuvant therapy with antiestrogen therapy however I do worry about side effects from the adjuvant antiestrogen therapy. After consultation with Dr. Dayton Scrape patient will only receive radiation therapy.      PLAN:  1. Continue radiation therapy.    2. We will see patient back in 2 months for f/u.     All questions were answered. The patient knows to call the clinic with any problems, questions or concerns. We can certainly see the patient much sooner if necessary.  I spent 15 minutes counseling the patient face to face. The total time spent in the appointment  was 30 minutes.   Cherie Ouch Lyn Hollingshead, NP Medical Oncology [facility] Phone: [phone]   08/29/2012, 3:41 PM

## 2012-08-29 NOTE — Patient Instructions (Addendum)
Doing well.  Take Vitamin D3 1000 IU daily.  Please call us if you have any questions or concerns.

## 2012-08-29 NOTE — Telephone Encounter (Signed)
, °

## 2012-08-30 ENCOUNTER — Ambulatory Visit
Admission: RE | Admit: 2012-08-30 | Discharge: 2012-08-30 | Disposition: A | Discharge status: Home / Self Care | Payer: Medicare Other | Source: Ambulatory Visit | Attending: Oncology | Admitting: Oncology

## 2012-08-30 ENCOUNTER — Encounter: Payer: Self-pay | Admitting: Adult Health

## 2012-08-30 ENCOUNTER — Ambulatory Visit
Admission: RE | Admit: 2012-08-30 | Discharge: 2012-08-30 | Disposition: A | Discharge status: Home / Self Care | Payer: Medicare Other | Source: Ambulatory Visit | Attending: Radiation Oncology | Admitting: Radiation Oncology

## 2012-08-30 DIAGNOSIS — D0510 Intraductal carcinoma in situ of unspecified breast: Secondary | ICD-10-CM

## 2012-08-31 ENCOUNTER — Ambulatory Visit
Admission: RE | Admit: 2012-08-31 | Discharge: 2012-08-31 | Disposition: A | Discharge status: Home / Self Care | Payer: Medicare Other | Source: Ambulatory Visit | Attending: Radiation Oncology | Admitting: Radiation Oncology

## 2012-09-01 ENCOUNTER — Ambulatory Visit
Admission: RE | Admit: 2012-09-01 | Discharge: 2012-09-01 | Disposition: A | Discharge status: Home / Self Care | Payer: Medicare Other | Source: Ambulatory Visit | Attending: Radiation Oncology | Admitting: Radiation Oncology

## 2012-09-04 ENCOUNTER — Ambulatory Visit
Admission: RE | Admit: 2012-09-04 | Discharge: 2012-09-04 | Disposition: A | Discharge status: Home / Self Care | Payer: Medicare Other | Source: Ambulatory Visit | Attending: Radiation Oncology | Admitting: Radiation Oncology

## 2012-09-04 ENCOUNTER — Encounter: Payer: Self-pay | Admitting: Radiation Oncology

## 2012-09-04 VITALS — BP 138/49 | HR 79 | Temp 97.7°F | Resp 20 | Wt 167.6 lb

## 2012-09-04 DIAGNOSIS — C50912 Malignant neoplasm of unspecified site of left female breast: Secondary | ICD-10-CM

## 2012-09-04 NOTE — Progress Notes (Signed)
Weekly Management Note:  Site: Left breast Current Dose:  4000  cGy Projected Dose: 5000  cGy  Narrative: The patient is seen today for routine under treatment assessment. CBCT/MVCT images/port films were reviewed. The chart was reviewed.   She does have worsening pruritus and erythema along the left breast. She uses Radioplex gel. She did placed in ice pack on her breast over the weekend.  Physical Examination:  Filed Vitals:   09/04/12 1417  BP: 138/49  Pulse: 79  Temp: 97.7 F (36.5 C)  Resp: 20  .  Weight: 167 lb 9.6 oz (76.023 kg). There is moderate erythema along the breast, particularly upper-outer quadrant which is slightly papular. She has dry desquamation along the left inframammary region.  Impression: Tolerating radiation therapy well, although she does have moderate radiation dermatitis as expected. I told she may use hydrocortisone cream when necessary along the area of pruritus along the upper inner quadrant.  Plan: Continue radiation therapy as planned.

## 2012-09-04 NOTE — Progress Notes (Signed)
Patient here rad tx 16 completed out of 20, rash on breast/chest area, erythema, using radiaplex gel bid, placed ice pack on rash/chest the other might,s aid it was  Burning, and has resolved , eating and drinking well, slight fatigue 2:21 PM

## 2012-09-05 ENCOUNTER — Ambulatory Visit
Admission: RE | Admit: 2012-09-05 | Discharge: 2012-09-05 | Disposition: A | Discharge status: Home / Self Care | Payer: Medicare Other | Source: Ambulatory Visit | Attending: Radiation Oncology | Admitting: Radiation Oncology

## 2012-09-06 ENCOUNTER — Ambulatory Visit: Payer: Medicare Other

## 2012-09-06 ENCOUNTER — Ambulatory Visit
Admission: RE | Admit: 2012-09-06 | Discharge: 2012-09-06 | Disposition: A | Discharge status: Home / Self Care | Payer: Medicare Other | Source: Ambulatory Visit | Attending: Radiation Oncology | Admitting: Radiation Oncology

## 2012-09-07 ENCOUNTER — Ambulatory Visit: Payer: Medicare Other

## 2012-09-07 ENCOUNTER — Ambulatory Visit
Admission: RE | Admit: 2012-09-07 | Discharge: 2012-09-07 | Disposition: A | Discharge status: Home / Self Care | Payer: Medicare Other | Source: Ambulatory Visit | Attending: Radiation Oncology | Admitting: Radiation Oncology

## 2012-09-08 ENCOUNTER — Ambulatory Visit
Admission: RE | Admit: 2012-09-08 | Discharge: 2012-09-08 | Disposition: A | Discharge status: Home / Self Care | Payer: Medicare Other | Source: Ambulatory Visit | Attending: Radiation Oncology | Admitting: Radiation Oncology

## 2012-09-08 ENCOUNTER — Ambulatory Visit: Payer: Medicare Other

## 2012-09-10 ENCOUNTER — Encounter: Payer: Self-pay | Admitting: Radiation Oncology

## 2012-09-10 NOTE — Progress Notes (Signed)
[facility] Radiation Oncology End of Treatment Note  Name:Nakyia AVIANA SHEVLIN  Date: 09/10/2012 ZOX:[id number] DOB:07/25/1928   Status:outpatient    CC: [name], MD  , Dr. Jaclynn Guarneri   REFERRING PHYSICIAN:   Dr. Jaclynn Guarneri    DIAGNOSIS: Stage I (T1 AN0 M0) invasive lobular carcinoma of the left breast with microinvasive ductal carcinoma/DCIS    INDICATION FOR TREATMENT: Curative   TREATMENT DATES: 08/10/2012 through 09/09/2038                          SITE/DOSE:  Left breast 4250 cGy 17 sessions, left breast boost 750 cGy in 3 sessions                          BEAMS/ENERGY:    6 MV photons, tangential fields to the left breast.   15 MEV electrons, left breast boost            NARRATIVE:   Ms. Uram tolerated her treatment beautifully with the expected degree of radiation dermatitis and patchy dry desquamation the skin by completion of therapy. She used Radioplex gel.                         PLAN: Routine followup in one month. Patient instructed to call if questions or worsening complaints in interim.

## 2012-09-11 ENCOUNTER — Ambulatory Visit: Payer: Medicare Other

## 2012-09-12 ENCOUNTER — Ambulatory Visit: Payer: Medicare Other

## 2012-09-13 ENCOUNTER — Ambulatory Visit: Payer: Medicare Other

## 2012-09-14 ENCOUNTER — Telehealth: Payer: Self-pay | Admitting: Oncology

## 2012-09-14 ENCOUNTER — Encounter: Payer: Self-pay | Admitting: Radiation Oncology

## 2012-09-14 ENCOUNTER — Ambulatory Visit
Admission: RE | Admit: 2012-09-14 | Discharge: 2012-09-14 | Disposition: A | Discharge status: Home / Self Care | Payer: Medicare Other | Source: Ambulatory Visit | Attending: Radiation Oncology | Admitting: Radiation Oncology

## 2012-09-14 ENCOUNTER — Ambulatory Visit: Payer: Medicare Other

## 2012-09-14 VITALS — BP 160/66 | HR 73 | Temp 98.3°F | Resp 20 | Wt 167.4 lb

## 2012-09-14 DIAGNOSIS — C50912 Malignant neoplasm of unspecified site of left female breast: Secondary | ICD-10-CM

## 2012-09-14 NOTE — Progress Notes (Signed)
x

## 2012-09-14 NOTE — Progress Notes (Signed)
Pt denies pain; she is c/o itching in upper left breast area and underneath left breast. Pt has bright hyperpigmentation over and under  breast, appears to have 2 blister areas under breast, no desquamation noted. She has been applying 1% Cortisone cream w/temporary relief. Pt has not been applying Radiaplex lotion. She states "when it itches it begins to hurt".

## 2012-09-14 NOTE — Progress Notes (Signed)
Clinic note: Ms. Stringfield returns today almost one week following completion of radiation therapy to her left breast. She is concerned about persistent erythema along the left inframammary region and upper inner quadrant of the left breast. She has been using hydrocortisone cream when necessary.  Physical examination: On inspection of left breast there is moderate erythema along the upper inner quadrant of the left breast and also inframammary region. There is patchy dry desquamation but no areas of moist desquamation. No evidence for a fungal infection.  Impression: Radiation dermatitis as expected.  Plan: She is continue with her hydrocortisone cream. She is to call me if she is not better by early next week. She is to maintain her followup visit with me in approximately 3 weeks.

## 2012-09-14 NOTE — Telephone Encounter (Signed)
Kimberly Acevedo called regarding redness underneath her left breast.  She finished treatment on 09/08/2012.  She also has a rash.  She is applying hydrocortizone cream to the irritated area and it is not getting any better.   The skin is itchy but not peeling.   She would like to come in and see Dr. Dayton Scrape.

## 2012-09-15 ENCOUNTER — Ambulatory Visit: Payer: Medicare Other

## 2012-09-18 ENCOUNTER — Ambulatory Visit: Payer: Medicare Other

## 2012-09-19 ENCOUNTER — Ambulatory Visit: Payer: Medicare Other

## 2012-09-20 ENCOUNTER — Ambulatory Visit: Payer: Medicare Other

## 2012-09-21 ENCOUNTER — Ambulatory Visit: Payer: Medicare Other

## 2012-09-22 ENCOUNTER — Ambulatory Visit: Payer: Medicare Other

## 2012-09-22 ENCOUNTER — Ambulatory Visit: Payer: Medicare Other | Admitting: Radiation Oncology

## 2012-09-25 ENCOUNTER — Encounter: Payer: Self-pay | Admitting: *Deleted

## 2012-09-25 NOTE — Progress Notes (Signed)
Mailed after appt letter to pt. 

## 2012-11-15 ENCOUNTER — Other Ambulatory Visit: Payer: Medicare Other | Admitting: Lab

## 2012-11-15 ENCOUNTER — Ambulatory Visit: Payer: Medicare Other | Admitting: Adult Health

## 2012-11-15 ENCOUNTER — Other Ambulatory Visit: Payer: Self-pay | Admitting: Emergency Medicine

## 2012-11-15 DIAGNOSIS — C50919 Malignant neoplasm of unspecified site of unspecified female breast: Secondary | ICD-10-CM

## 2012-11-16 ENCOUNTER — Telehealth: Payer: Self-pay | Admitting: Oncology

## 2012-11-17 ENCOUNTER — Telehealth: Payer: Self-pay | Admitting: Oncology

## 2012-11-17 NOTE — Telephone Encounter (Signed)
, °

## 2012-11-21 ENCOUNTER — Ambulatory Visit: Payer: Medicare Other | Admitting: Adult Health

## 2012-11-21 ENCOUNTER — Other Ambulatory Visit: Payer: Medicare Other | Admitting: Lab

## 2012-11-22 ENCOUNTER — Ambulatory Visit
Admission: RE | Admit: 2012-11-22 | Discharge: 2012-11-22 | Disposition: A | Discharge status: Home / Self Care | Payer: Medicare Other | Source: Ambulatory Visit | Attending: Radiation Oncology | Admitting: Radiation Oncology

## 2012-11-22 ENCOUNTER — Ambulatory Visit (HOSPITAL_BASED_OUTPATIENT_CLINIC_OR_DEPARTMENT_OTHER): Payer: Medicare Other | Admitting: Adult Health

## 2012-11-22 ENCOUNTER — Telehealth: Payer: Self-pay | Admitting: *Deleted

## 2012-11-22 ENCOUNTER — Encounter: Payer: Self-pay | Admitting: Radiation Oncology

## 2012-11-22 ENCOUNTER — Other Ambulatory Visit (HOSPITAL_BASED_OUTPATIENT_CLINIC_OR_DEPARTMENT_OTHER): Payer: Medicare Other

## 2012-11-22 VITALS — BP 145/72 | HR 61 | Temp 97.6°F | Wt 167.4 lb

## 2012-11-22 VITALS — BP 148/64 | HR 65 | Temp 97.8°F | Resp 20 | Ht 68.0 in | Wt 168.0 lb

## 2012-11-22 DIAGNOSIS — C50919 Malignant neoplasm of unspecified site of unspecified female breast: Secondary | ICD-10-CM

## 2012-11-22 DIAGNOSIS — C50912 Malignant neoplasm of unspecified site of left female breast: Secondary | ICD-10-CM

## 2012-11-22 DIAGNOSIS — C50219 Malignant neoplasm of upper-inner quadrant of unspecified female breast: Secondary | ICD-10-CM

## 2012-11-22 LAB — CBC WITH DIFFERENTIAL/PLATELET
Eosinophils Absolute: 0.1 10*3/uL (ref 0.0–0.5)
MONO#: 0.5 10*3/uL (ref 0.1–0.9)
MONO%: 6.7 % (ref 0.0–14.0)
NEUT#: 4.5 10*3/uL (ref 1.5–6.5)
RBC: 4.53 10*6/uL (ref 3.70–5.45)
RDW: 12.7 % (ref 11.2–14.5)
WBC: 7 10*3/uL (ref 3.9–10.3)

## 2012-11-22 LAB — COMPREHENSIVE METABOLIC PANEL (CC13)
Albumin: 3.5 g/dL (ref 3.5–5.0)
Alkaline Phosphatase: 100 U/L (ref 40–150)
CO2: 29 mEq/L (ref 22–29)
Glucose: 133 mg/dl — ABNORMAL HIGH (ref 70–99)
Potassium: 3.7 mEq/L (ref 3.5–5.1)
Sodium: 143 mEq/L (ref 136–145)
Total Protein: 7.3 g/dL (ref 6.4–8.3)

## 2012-11-22 NOTE — Progress Notes (Signed)
OFFICE PROGRESS NOTE  CC**  [name], MD [address] [place] [address]  DIAGNOSIS: 79 year old female with stage I invasive lobular carcinoma of the left breast in addition to microinvasive ductal carcinoma and DCIS.   PRIOR THERAPY: 1. Patient diagnosed during screening mammo on 05/19/12 finding possible mass within left breast.  Ultrasound shoed a 7mm mass along the upper inner left breast.  The mass measured 4 x 5 x 7 mm at 11:00 8cm from the nipple, no abnormal lymph nodes.  Needle core biopsy on 06/15/12 showed DCIS ER/PR positive.    2. She underwent left partial mastectomy on 07/04/12.  She had a 0.2cm invasive lobular carcinoma along with LCIS. Also noted low grade DCIS 0.7cm with microinvasive grade 1 ductal carcinoma.  The invasive carcinoma was less than 0.1cm from the main inferior margin.  Tumor was ER PR positive.    3. Patient underwent radiation therapy with Dr. Dayton Scrape from 08/10/12 through 09/08/12.  CURRENT THERAPY: observation  INTERVAL HISTORY: Kimberly Acevedo 79 y.o. female returns for follow up for her DCIS.  She is doing well.  She has some hemorrhoids that she is using over the counter cream for these, otherwise, she is doing well.  She denies fevers, chills, night sweats, unintentional weight loss, hot flashes, or any other concerns.  A complete 10 point ROS is neg.   MEDICAL HISTORY: Past Medical History  Diagnosis Date  . Fibromyalgia   . Dyslipidemia   . History of chest pain   . Achalasia, esophageal     history of  . History of dizziness   . History of fatigue   . Hypertension   . Hemorrhoids   . Arthritis   . Diverticulitis   . Kidney problem     spot on kidney, pt  says being elevated in April 2013  . Acid reflux   . Sleep apnea     uses a cpap  . Radiation 08/10/12-09/08/2012    Left breast 750 cGy in 3 sessions    ALLERGIES:  is allergic to penicillins; statins; sulfa drugs cross reactors; tramadol hcl; aspirin;  and codeine.  MEDICATIONS:  Current Outpatient Prescriptions  Medication Sig Dispense Refill  . acetaminophen (TYLENOL) 500 MG tablet Take 500 mg by mouth every 6 (six) hours as needed. For arthritis      . diltiazem (DILACOR XR) 240 MG 24 hr capsule Take 240 mg by mouth daily.       . hyaluronate sodium (RADIAPLEXRX) GEL Apply topically 2 (two) times daily.      . non-metallic deodorant Thornton Papas) MISC Apply 1 application topically daily as needed.      . sertraline (ZOLOFT) 100 MG tablet Take 100 mg by mouth at bedtime.       . valsartan-hydrochlorothiazide (DIOVAN-HCT) 320-25 MG per tablet Take 1 tablet by mouth daily.       No current facility-administered medications for this visit.    SURGICAL HISTORY:  Past Surgical History  Procedure Laterality Date  . Rectocele repair    . Total abdominal hysterectomy    . Esophageal dilation    . Cholecystectomy  2007  . Colonoscopy    . Breast lumpectomy with needle localization  07/04/2012    Procedure: BREAST LUMPECTOMY WITH NEEDLE LOCALIZATION;  Surgeon: [name], MD;  Location: [facility];  Service: General;  Laterality: Left;  left  . Varicose vein surgery  2010    left lower extremity  REVIEW OF SYSTEMS:  General: fatigue (-), night sweats (-), fever (-), pain (-) Lymph: palpable nodes (-) HEENT: vision changes (-), mucositis (-), gum bleeding (-), epistaxis (-) Cardiovascular: chest pain (-), palpitations (-) Pulmonary: shortness of breath (-), dyspnea on exertion (-), cough (-), hemoptysis (-) GI:  Early satiety (-), melena (-), dysphagia (-), nausea/vomiting (-), diarrhea (-) GU: dysuria (-), hematuria (-), incontinence (-) Musculoskeletal: joint swelling (-), joint pain (-), back pain (-) Neuro: weakness (-), numbness (-), headache (-), confusion (-) Skin: Rash (-), lesions (-), dryness (-) Psych: depression (-), suicidal/homicidal ideation (-), feeling of hopelessness (-)   HEALTH  MAINTENANCE:  Mammogram 05/19/12 Colonoscopy 6 years ago Bone Density unknown results, March 2014 Pap Smear s/p TAH Eye Exam 4/13 Vitamin D unknown Lipid Panel 08/2011  PHYSICAL EXAMINATION: Blood pressure 148/64, pulse 65, temperature 97.8 F (36.6 C), temperature source Oral, resp. rate 20, height 5\' 8"  (1.727 m), weight 168 lb (76.204 kg). Body mass index is 25.55 kg/(m^2). General: Patient is a well appearing female in no acute distress HEENT: PERRLA, sclerae anicteric no conjunctival pallor, MMM Neck: supple, no palpable adenopathy Lungs: clear to auscultation bilaterally, no wheezes, rhonchi, or rales Cardiovascular: regular rate rhythm, S1, S2, no murmurs, rubs or gallops Abdomen: Soft, non-tender, non-distended, normoactive bowel sounds, no HSM Extremities: warm and well perfused, no clubbing, cyanosis, or edema Skin: No rashes or lesions Neuro: Non-focal Breasts: right breast, no masses or nodularity.  Left breast with skin changes due to radiation, no masses, nodularity or sign of recurrence. ECOG PERFORMANCE STATUS: 1 - Symptomatic but completely ambulatory      LABORATORY DATA: Lab Results  Component Value Date   WBC 7.0 11/22/2012   HGB 14.5 11/22/2012   HCT 42.0 11/22/2012   MCV 92.8 11/22/2012   PLT 187 11/22/2012      Chemistry      Component Value Date/Time   NA 142 08/29/2012 1512   NA 140 06/29/2012 1300   K 3.4* 08/29/2012 1512   K 4.4 06/29/2012 1300   CL 102 08/29/2012 1512   CL 99 06/29/2012 1300   CO2 31* 08/29/2012 1512   CO2 31 06/29/2012 1300   BUN 8.9 08/29/2012 1512   BUN 11 06/29/2012 1300   CREATININE 0.8 08/29/2012 1512   CREATININE 0.69 06/29/2012 1300      Component Value Date/Time   CALCIUM 9.6 08/29/2012 1512   CALCIUM 9.7 06/29/2012 1300   ALKPHOS 70 08/29/2012 1512   ALKPHOS 62 08/30/2011 1717   AST 19 08/29/2012 1512   AST 29 08/30/2011 1717   ALT 18 08/29/2012 1512   ALT 17 08/30/2011 1717   BILITOT 0.32 08/29/2012 1512   BILITOT 0.4  08/30/2011 1717       RADIOGRAPHIC STUDIES:  No results found.  ASSESSMENT: 79 year old female with  #1 new diagnosis of stage I (T1 A. NX M0) invasive lobular/LCIS and in addition to microinvasive ductal carcinoma/DCIS. Postoperatively patient is doing well. She is a good candidate for antiestrogen therapy and we discussed this as well. I do think that she needs a consultation with radiation oncology and I will refer her to Dr. Chipper Herb. Certainly she can get radiation therapy initially. I do think that she would benefit from radiation for local control perspective. She could obtain same with adjuvant therapy with antiestrogen therapy however I do worry about side effects from the adjuvant antiestrogen therapy. After consultation with Dr. Dayton Scrape patient will only receive radiation therapy.  PLAN:  1. Doing well after radiation therapy.  No sign of recurrence.  We discussed survivorship, diet, and exercise in detail.    2. We will see patient back in 6 months for f/u.     All questions were answered. The patient knows to call the clinic with any problems, questions or concerns. We can certainly see the patient much sooner if necessary.  I spent 25 minutes counseling the patient face to face. The total time spent in the appointment was 30 minutes.   Cherie Ouch Lyn Hollingshead, NP Medical Oncology [facility] Phone: [phone] 11/22/2012, 1:57 PM

## 2012-11-22 NOTE — Telephone Encounter (Signed)
appts made and printed...td 

## 2012-11-22 NOTE — Progress Notes (Signed)
Followup note:  [name] returns today approximately 2 months following completion of radiation therapy following conservative surgery in the management of her T1a N0 invasive lobular carcinoma of the left breast with microinvasive ductal carcinoma/DCIS. She is without complaints today. Dr. Welton Flakes or her PA, and Augustin Schooling, is to see her today and have a discussion regarding adjuvant antiestrogen hormone therapy. I do not believe that she had a bone density yet as previously discussed by Dr. Welton Flakes. She will see Dr. Johna Sheriff for a followup visit on August 6.  Physical examination: Alert and oriented. Filed Vitals:   11/22/12 1049  BP: 145/72  Pulse: 61  Temp: 97.6 F (36.4 C)    Head and neck examination: Grossly unremarkable. Nodes: Without palpable cervical, supraclavicular, or axillary lymphadenopathy. Chest: Lungs clear. Breasts: There is residual hyperpigmentation the skin along left breast with minimal thickening. No masses are appreciated. Right breast without masses or lesions. Extremities: Without edema  Impression: Satisfactory progress.  Plan: She will see medical oncology today, Dr. Johna Sheriff in August. I told her that it would be satisfactory for her to wait until December to have a screening right breast mammogram and diagnostic baseline left breast mammogram. I've not scheduled [name] for formal followup visit, but I would be more than happy to see her in the future should the need arise.

## 2012-11-22 NOTE — Progress Notes (Signed)
Patient here for routine follow up completion of radiation on 09/08/12 750 cGy.Patient has hemorrhoidal discomfort but no problems related to left breast treatment.Denies skin changes.Scheduled for medical oncology appointment this afternoon.

## 2012-11-22 NOTE — Patient Instructions (Addendum)
Doing well.  Labs look good.  We recommend you take Vitamin D3 1000 IU daily.  No sign of recurrence.  We will see you back in 6 months.  Please call us if you have any questions or concerns.

## 2013-01-10 ENCOUNTER — Ambulatory Visit (INDEPENDENT_AMBULATORY_CARE_PROVIDER_SITE_OTHER): Payer: Medicare Other | Admitting: General Surgery

## 2013-01-25 ENCOUNTER — Ambulatory Visit (HOSPITAL_BASED_OUTPATIENT_CLINIC_OR_DEPARTMENT_OTHER): Admit: 2013-01-25 | Payer: Medicare Other | Admitting: Orthopedic Surgery

## 2013-01-25 ENCOUNTER — Encounter (HOSPITAL_BASED_OUTPATIENT_CLINIC_OR_DEPARTMENT_OTHER): Payer: Self-pay

## 2013-01-25 SURGERY — CARPAL TUNNEL RELEASE
Anesthesia: Monitor Anesthesia Care | Site: Wrist | Laterality: Right

## 2013-02-09 ENCOUNTER — Ambulatory Visit (INDEPENDENT_AMBULATORY_CARE_PROVIDER_SITE_OTHER): Payer: Medicare Other | Admitting: General Surgery

## 2013-03-02 ENCOUNTER — Ambulatory Visit (INDEPENDENT_AMBULATORY_CARE_PROVIDER_SITE_OTHER): Payer: Medicare Other | Admitting: General Surgery

## 2013-04-12 ENCOUNTER — Other Ambulatory Visit: Payer: Self-pay | Admitting: Internal Medicine

## 2013-04-12 DIAGNOSIS — R413 Other amnesia: Secondary | ICD-10-CM

## 2013-04-28 ENCOUNTER — Other Ambulatory Visit: Payer: Medicare Other

## 2013-05-02 ENCOUNTER — Ambulatory Visit (INDEPENDENT_AMBULATORY_CARE_PROVIDER_SITE_OTHER): Payer: Medicare Other | Admitting: General Surgery

## 2013-05-04 ENCOUNTER — Ambulatory Visit
Admission: RE | Admit: 2013-05-04 | Discharge: 2013-05-04 | Disposition: A | Discharge status: Home / Self Care | Payer: Medicare Other | Source: Ambulatory Visit | Attending: Internal Medicine | Admitting: Internal Medicine

## 2013-05-04 DIAGNOSIS — R413 Other amnesia: Secondary | ICD-10-CM

## 2013-05-21 ENCOUNTER — Inpatient Hospital Stay (HOSPITAL_COMMUNITY)
Admission: AD | Admit: 2013-05-21 | Discharge: 2013-05-22 | DRG: 379 | Disposition: A | Discharge status: Home / Self Care | Payer: Medicare Other | Attending: Family Medicine | Admitting: Family Medicine

## 2013-05-21 ENCOUNTER — Encounter (HOSPITAL_COMMUNITY): Payer: Self-pay | Admitting: Emergency Medicine

## 2013-05-21 DIAGNOSIS — E785 Hyperlipidemia, unspecified: Secondary | ICD-10-CM | POA: Diagnosis present

## 2013-05-21 DIAGNOSIS — M797 Fibromyalgia: Secondary | ICD-10-CM | POA: Diagnosis present

## 2013-05-21 DIAGNOSIS — R5381 Other malaise: Secondary | ICD-10-CM | POA: Diagnosis present

## 2013-05-21 DIAGNOSIS — E876 Hypokalemia: Secondary | ICD-10-CM | POA: Diagnosis present

## 2013-05-21 DIAGNOSIS — K648 Other hemorrhoids: Secondary | ICD-10-CM | POA: Diagnosis present

## 2013-05-21 DIAGNOSIS — F32A Depression, unspecified: Secondary | ICD-10-CM | POA: Diagnosis present

## 2013-05-21 DIAGNOSIS — I10 Essential (primary) hypertension: Secondary | ICD-10-CM

## 2013-05-21 DIAGNOSIS — F0390 Unspecified dementia without behavioral disturbance: Secondary | ICD-10-CM | POA: Diagnosis present

## 2013-05-21 DIAGNOSIS — F329 Major depressive disorder, single episode, unspecified: Secondary | ICD-10-CM

## 2013-05-21 DIAGNOSIS — K219 Gastro-esophageal reflux disease without esophagitis: Secondary | ICD-10-CM | POA: Diagnosis present

## 2013-05-21 DIAGNOSIS — K5733 Diverticulitis of large intestine without perforation or abscess with bleeding: Principal | ICD-10-CM | POA: Diagnosis present

## 2013-05-21 DIAGNOSIS — G4730 Sleep apnea, unspecified: Secondary | ICD-10-CM | POA: Diagnosis present

## 2013-05-21 DIAGNOSIS — IMO0001 Reserved for inherently not codable concepts without codable children: Secondary | ICD-10-CM

## 2013-05-21 DIAGNOSIS — K625 Hemorrhage of anus and rectum: Secondary | ICD-10-CM

## 2013-05-21 DIAGNOSIS — K220 Achalasia of cardia: Secondary | ICD-10-CM

## 2013-05-21 DIAGNOSIS — F3289 Other specified depressive episodes: Secondary | ICD-10-CM

## 2013-05-21 LAB — CBC WITH DIFFERENTIAL/PLATELET
Basophils Relative: 1 % (ref 0–1)
Eosinophils Absolute: 0 10*3/uL (ref 0.0–0.7)
HCT: 38 % (ref 36.0–46.0)
Hemoglobin: 13.5 g/dL (ref 12.0–15.0)
Lymphs Abs: 1.6 10*3/uL (ref 0.7–4.0)
MCH: 32.5 pg (ref 26.0–34.0)
MCHC: 35.5 g/dL (ref 30.0–36.0)
Monocytes Absolute: 0.4 10*3/uL (ref 0.1–1.0)
Monocytes Relative: 8 % (ref 3–12)
Neutro Abs: 3.6 10*3/uL (ref 1.7–7.7)
Neutrophils Relative %: 63 % (ref 43–77)
RBC: 4.16 MIL/uL (ref 3.87–5.11)
RDW: 12 % (ref 11.5–15.5)

## 2013-05-21 LAB — MAGNESIUM: Magnesium: 1.9 mg/dL (ref 1.5–2.5)

## 2013-05-21 LAB — APTT: aPTT: 28 seconds (ref 24–37)

## 2013-05-21 LAB — PHOSPHORUS: Phosphorus: 3.9 mg/dL (ref 2.3–4.6)

## 2013-05-21 LAB — COMPREHENSIVE METABOLIC PANEL
AST: 15 U/L (ref 0–37)
Albumin: 3.2 g/dL — ABNORMAL LOW (ref 3.5–5.2)
BUN: 9 mg/dL (ref 6–23)
Calcium: 9.2 mg/dL (ref 8.4–10.5)
Chloride: 98 mEq/L (ref 96–112)
Creatinine, Ser: 0.71 mg/dL (ref 0.50–1.10)
Total Bilirubin: 0.4 mg/dL (ref 0.3–1.2)
Total Protein: 6.4 g/dL (ref 6.0–8.3)

## 2013-05-21 LAB — PROTIME-INR: INR: 0.98 (ref 0.00–1.49)

## 2013-05-21 LAB — SAMPLE TO BLOOD BANK

## 2013-05-21 MED ORDER — SERTRALINE HCL 100 MG PO TABS
100.0000 mg | ORAL_TABLET | Freq: Every day | ORAL | Status: DC
  Administered 2013-05-21: 100 mg via ORAL
  Filled 2013-05-21 (×2): qty 1

## 2013-05-21 MED ORDER — HYDROCODONE-ACETAMINOPHEN 5-325 MG PO TABS
1.0000 | ORAL_TABLET | ORAL | Status: DC | PRN
  Administered 2013-05-21: 1 via ORAL
  Filled 2013-05-21: qty 1

## 2013-05-21 MED ORDER — ACETAMINOPHEN 650 MG RE SUPP: 650.0000 mg | Freq: Four times a day (QID) | RECTAL | Status: DC | PRN

## 2013-05-21 MED ORDER — POTASSIUM CHLORIDE CRYS ER 20 MEQ PO TBCR
20.0000 meq | EXTENDED_RELEASE_TABLET | Freq: Once | ORAL | Status: AC
  Administered 2013-05-21: 20 meq via ORAL
  Filled 2013-05-21: qty 1

## 2013-05-21 MED ORDER — POTASSIUM CHLORIDE CRYS ER 20 MEQ PO TBCR
40.0000 meq | EXTENDED_RELEASE_TABLET | Freq: Once | ORAL | Status: AC
  Administered 2013-05-21: 40 meq via ORAL
  Filled 2013-05-21 (×2): qty 2

## 2013-05-21 MED ORDER — ONDANSETRON HCL 4 MG PO TABS: 4.0000 mg | ORAL_TABLET | Freq: Four times a day (QID) | ORAL | Status: DC | PRN

## 2013-05-21 MED ORDER — ACETAMINOPHEN 325 MG PO TABS: 650.0000 mg | ORAL_TABLET | Freq: Four times a day (QID) | ORAL | Status: DC | PRN

## 2013-05-21 MED ORDER — ONDANSETRON HCL 4 MG/2ML IJ SOLN: 4.0000 mg | Freq: Four times a day (QID) | INTRAMUSCULAR | Status: DC | PRN

## 2013-05-21 MED ORDER — VITAMIN D3 25 MCG (1000 UNIT) PO TABS
1000.0000 [IU] | ORAL_TABLET | Freq: Every day | ORAL | Status: DC
  Administered 2013-05-21 – 2013-05-22 (×2): 1000 [IU] via ORAL
  Filled 2013-05-21 (×2): qty 1

## 2013-05-21 MED ORDER — SODIUM CHLORIDE 0.9 % IV SOLN
INTRAVENOUS | Status: DC
  Administered 2013-05-21 – 2013-05-22 (×2): via INTRAVENOUS

## 2013-05-21 MED ORDER — VITAMIN D-3 25 MCG (1000 UT) PO CAPS
1.0000 | ORAL_CAPSULE | Freq: Every day | ORAL | Status: DC
Start: 2013-05-21 — End: 2013-05-21

## 2013-05-21 NOTE — ED Notes (Signed)
Per EMS-pt here with c/o of bleeding, states that she doesn't know if it is rectal or vaginal. States that she had a small amount of blood in the toilet this am. Denies n/v/d. No pain.

## 2013-05-21 NOTE — H&P (Signed)
[facility] History and Physical  ARNECIA ECTOR Acevedo:[id number] DOB: 1929-03-29 DOA: 05/21/2013  Referring physician: ER physician PCP: Kimberly Moh, MD   Chief Complaint: rectal bleed  HPI:  79 year old female with past medical history of hypertension, depression who presented to [facility] ED 05/21/2013 with complaints of rectal bleed started last night prior to this admission. Pt reported bleeding per rectum 1 episode, not painful but id=t did not happen again. She was concerned about this and decided to come to ED for evaluation. Pt reported abdominal discomfort in mid right abdomen associated with nausea but no vomiting. No fever or chills. No lightheadedness or dizziness. In ED, vitals were stable with BP of 109/62, HR was 61 and T max 97.8 FF, oxygen saturation was 90 - 98% on room air. Besides potassium of 3 which was repleted in ED, the remaining blood work was unremarkable.  Assessment and Plan:  Principal Problem:   Rectal bleeding - painless, likely diverticular bleed; has history of diverticulosis - FOBT positive; hemoglobin however WNL - repeat admission labs - give IV fluids for next 12 hours - CBC in am Active Problems:   Hypertension - BP reasonably well controlled - may continue home meds   Hypokalemia - repleted in ED   Depression - continue zoloft  Radiological Exams on Admission: No results found.    Code Status: Full Family Communication: Family at bedside Disposition Plan: Admit for further evaluation  [name], MD  [facility] Pager [phone]  Review of Systems:  Constitutional: Negative for fever, chills and malaise/fatigue. Negative for diaphoresis.  HENT: Negative for hearing loss, ear pain, nosebleeds, congestion, sore throat, neck pain, tinnitus and ear discharge.   Eyes: Negative for blurred vision, double vision, photophobia, pain, discharge and redness.  Respiratory: Negative for cough, hemoptysis, sputum production,  shortness of breath, wheezing and stridor.   Cardiovascular: Negative for chest pain, palpitations, orthopnea, claudication and leg swelling.  Gastrointestinal: perHPI  Genitourinary: Negative for dysuria, urgency, frequency, hematuria and flank pain.  Musculoskeletal: Negative for myalgias, back pain, joint pain and falls.  Skin: Negative for itching and rash.  Neurological: Negative for dizziness and weakness. Negative for tingling, tremors, sensory change, speech change, focal weakness, loss of consciousness and headaches.  Endo/Heme/Allergies: Negative for environmental allergies and polydipsia. Does not bruise/bleed easily.  Psychiatric/Behavioral: Negative for suicidal ideas. The patient is not nervous/anxious.      Past Medical History  Diagnosis Date  . Fibromyalgia   . Dyslipidemia   . History of chest pain   . Achalasia, esophageal     history of  . History of dizziness   . History of fatigue   . Hypertension   . Hemorrhoids   . Arthritis   . Diverticulitis   . Kidney problem     spot on kidney, pt  says being elevated in April 2013  . Acid reflux   . Sleep apnea     uses a cpap  . Radiation 08/10/12-09/08/2012    Left breast 750 cGy in 3 sessions   Past Surgical History  Procedure Laterality Date  . Rectocele repair    . Total abdominal hysterectomy    . Esophageal dilation    . Cholecystectomy  2007  . Colonoscopy    . Breast lumpectomy with needle localization  07/04/2012    Procedure: BREAST LUMPECTOMY WITH NEEDLE LOCALIZATION;  Surgeon: [name], MD;  Location:  S[facility]ERY [facility];  Service: General;  Laterality: Left;  left  . Varicose  vein surgery  2010    left lower extremity   Social History:  reports that she has never smoked. She has never used smokeless tobacco. She reports that she does not drink alcohol or use illicit drugs.  Allergies  Allergen Reactions  . Penicillins Swelling and Rash  . Statins   . Sulfa Drugs Cross Reactors  Shortness Of Breath  . Tramadol Hcl Anaphylaxis  . Aspirin Swelling  . Codeine Other (See Comments)    unknown    Family History  Problem Relation Age of Onset  . Cancer Mother 2    melanoma ca  . Arthritis Father      Prior to Admission medications   Medication Sig Start Date End Date Taking? Authorizing Provider  acetaminophen (TYLENOL) 500 MG tablet Take 500 mg by mouth every 6 (six) hours as needed. For arthritis   Yes Historical Provider, MD  Cholecalciferol (VITAMIN D-3) 1000 UNITS CAPS Take 1 capsule by mouth daily.   Yes Historical Provider, MD  losartan-hydrochlorothiazide (HYZAAR) 100-25 MG per tablet Take 1 tablet by mouth daily. 05/14/13  Yes Historical Provider, MD  sertraline (ZOLOFT) 100 MG tablet Take 100 mg by mouth at bedtime.    Yes Historical Provider, MD  valsartan-hydrochlorothiazide (DIOVAN-HCT) 320-25 MG per tablet Take 1 tablet by mouth daily.   Yes Historical Provider, MD   Physical Exam: Filed Vitals:   05/21/13 1015 05/21/13 1030 05/21/13 1047 05/21/13 1206  BP:    157/62  Pulse: 63 71 69 66  Temp:    97.8 F (36.6 C)  TempSrc:    Oral  Resp: 13 14 15 18   Height:    5\' 8"  (1.727 m)  SpO2: 92% 91% 95% 98%    Physical Exam  Constitutional: Appears well-developed and well-nourished. No distress.  HENT: Normocephalic. External right and left ear normal. Oropharynx is clear and moist.  Eyes: Conjunctivae and EOM are normal. PERRLA, no scleral icterus.  Neck: Normal ROM. Neck supple. No JVD. No tracheal deviation. No thyromegaly.  CVS: RRR, S1/S2 +, no murmurs, no gallops, no carotid bruit.  Pulmonary: Effort and breath sounds normal, no stridor, rhonchi, wheezes, rales.  Abdominal: Soft. BS +,  no distension, tenderness, rebound or guarding.  Musculoskeletal: Normal range of motion. No edema and no tenderness.  Lymphadenopathy: No lymphadenopathy noted, cervical, inguinal. Neuro: Alert. Normal reflexes, muscle tone coordination. No cranial nerve  deficit. Skin: Skin is warm and dry. No rash noted. Not diaphoretic. No erythema. No pallor.  Psychiatric: Normal mood and affect. Behavior, judgment, thought content normal.   Labs on Admission:  Basic Metabolic Panel:  Recent Labs Lab 05/21/13 0515 05/21/13 0906  NA  --  138  K  --  3.0*  CL  --  98  CO2  --  27  GLUCOSE  --  117*  BUN  --  9  CREATININE  --  0.71  CALCIUM  --  9.2  MG 1.9  --   PHOS 3.9  --    Liver Function Tests:  Recent Labs Lab 05/21/13 0906  AST 15  ALT 11  ALKPHOS 64  BILITOT 0.4  PROT 6.4  ALBUMIN 3.2*   No results found for this basename: LIPASE, AMYLASE,  in the last 168 hours No results found for this basename: AMMONIA,  in the last 168 hours CBC:  Recent Labs Lab 05/21/13 0906  WBC 5.7  NEUTROABS 3.6  HGB 13.5  HCT 38.0  MCV 91.3  PLT 181   Cardiac  Enzymes: No results found for this basename: CKTOTAL, CKMB, CKMBINDEX, TROPONINI,  in the last 168 hours BNP: No components found with this basename: POCBNP,  CBG: No results found for this basename: GLUCAP,  in the last 168 hours  If 7PM-7AM, please contact night-coverage [email or web address] Password [email or web address] 05/21/2013, 1:27 PM

## 2013-05-21 NOTE — Progress Notes (Signed)
Patient is to be transferred to room 1511. Report given to Evangeline. Patient is stable for transfer and gave explanation to her.

## 2013-05-21 NOTE — Evaluation (Signed)
Physical Therapy Evaluation Patient Details Name: Kimberly Acevedo MRN: [id number] DOB: September 17, 1928 Today's Date: 05/21/2013 Time: 7829-5621 PT Time Calculation (min): 14 min  PT Assessment / Plan / Recommendation History of Present Illness  79 yo female admitted with rectal bleeding. Pt lives alone  Clinical Impression  On eval, pt required Min guard assist for mobility-able to ambulate ~175 feet without assistive device. No LOB but pt did have some intermittent unsteadiness. Plan is for pt to d/c home on tomorrow. Granddaughter present and stated she will be living with pt for the next month and she will be available to assist as needed.     PT Assessment  Patient needs continued PT services    Follow Up Recommendations  No PT follow up    Does the patient have the potential to tolerate intense rehabilitation      Barriers to Discharge        Equipment Recommendations  None recommended by PT    Recommendations for Other Services OT consult   Frequency Min 3X/week    Precautions / Restrictions Precautions Precautions: Fall Restrictions Weight Bearing Restrictions: No   Pertinent Vitals/Pain No c/o pain      Mobility  Bed Mobility Bed Mobility: Supine to Sit;Sit to Supine Supine to Sit: 6: Modified independent (Device/Increase time) Sit to Supine: 6: Modified independent (Device/Increase time) Transfers Transfers: Sit to Stand;Stand to Sit Sit to Stand: 6: Modified independent (Device/Increase time);From bed Stand to Sit: 6: Modified independent (Device/Increase time);To bed Ambulation/Gait Ambulation/Gait Assistance: 4: Min guard Ambulation Distance (Feet): 175 Feet Assistive device: None Ambulation/Gait Assistance Details: Intermittent unsteadiness. No LOB Gait Pattern: Step-through pattern Stairs: Yes Stairs Assistance: 4: Min guard Stairs Assistance Details (indicate cue type and reason): VCS safety.  Stair Management Technique: One rail Right;Step to  pattern;Forwards Number of Stairs: 3    Exercises     PT Diagnosis: Difficulty walking  PT Problem List: Decreased mobility PT Treatment Interventions: Gait training;Stair training;Functional mobility training;Therapeutic activities;Therapeutic exercise;Patient/family education     PT Goals(Current goals can be found in the care plan section) Acute Rehab PT Goals Patient Stated Goal: home tomorrow PT Goal Formulation: With patient/family Time For Goal Achievement: 05/28/13 Potential to Achieve Goals: Good  Visit Information  Last PT Received On: 05/21/13 Assistance Needed: +1 History of Present Illness: 79 yo female admitted with rectal bleeding. Pt lives alone       Prior Functioning  Home Living Family/patient expects to be discharged to:: Private residence Living Arrangements: Alone Available Help at Discharge: Family (granddaughter will be living with pt for the next month) Type of Home: House Home Access: Stairs to enter [employer] of Steps: 6 Entrance Stairs-Rails: Right Home Layout: Two level;Able to live on main level with bedroom/bathroom Home Equipment: None Prior Function Level of Independence: Independent Communication Communication: No difficulties    Cognition  Cognition Arousal/Alertness: Awake/alert Behavior During Therapy: WFL for tasks assessed/performed Overall Cognitive Status: Within Functional Limits for tasks assessed    Extremity/Trunk Assessment Upper Extremity Assessment Upper Extremity Assessment: Defer to OT evaluation Lower Extremity Assessment Lower Extremity Assessment: Generalized weakness Cervical / Trunk Assessment Cervical / Trunk Assessment: Normal   Balance    End of Session PT - End of Session Equipment Utilized During Treatment: Gait belt Activity Tolerance: Patient tolerated treatment well Patient left: in bed;with call bell/phone within reach;with family/visitor present  GP Functional Assessment Tool Used:  clinical judgmeent Functional Limitation: Mobility: Walking and moving around Mobility: Walking and Moving Around Current Status (  [id number]): At least 1 percent but less than 20 percent impaired, limited or restricted Mobility: Walking and Moving Around Goal Status [phone]): 0 percent impaired, limited or restricted   Rebeca Alert, MPT Pager: [phone]

## 2013-05-21 NOTE — ED Notes (Signed)
Bed: WA20 Expected date: 05/21/13 Expected time: 7:44 AM Means of arrival:  Comments: 44 Female GI Bleed

## 2013-05-21 NOTE — ED Provider Notes (Signed)
CSN: [id number]     Arrival date & time 05/21/13  8469 History   First MD Initiated Contact with Patient 05/21/13 0804     Chief Complaint  Patient presents with  . Rectal Bleeding    Level V caveat for dementia  (Consider location/radiation/quality/duration/timing/severity/associated sxs/prior Treatment) HPI History Limited because most answers are "For awhile". Patient reports starting noticing rectal bleeding yesterday. She states it's happened 3 times. The last episode was about 7 am when she called her daughter to tell her.  She describes it as bright red blood. She denies clots.  She had nausea earlier today per family but not now. She states she feels weak but is not dizzy. She states she's walking normally. She has had diffuse lower abdominal soreness for "some time now" according to patient and her family. She denies rectal pain.  They cannot elucidate how long that is. She denies any change in her diet. She states she's never had this before. They report she had a colonoscopy "a while back" but they do not know the results.  Family report early stages of dementia when asked about her difficulty giving history today.    PCP Dr Renne Crigler  Next appt 12/18  Past Medical History  Diagnosis Date  . Fibromyalgia   . Dyslipidemia   . History of chest pain   . Achalasia, esophageal     history of  . History of dizziness   . History of fatigue   . Hypertension   . Hemorrhoids   . Arthritis   . Diverticulitis   . Kidney problem     spot on kidney, pt  says being elevated in April 2013  . Acid reflux   . Sleep apnea     uses a cpap  . Radiation 08/10/12-09/08/2012    Left breast 750 cGy in 3 sessions   Past Surgical History  Procedure Laterality Date  . Rectocele repair    . Total abdominal hysterectomy    . Esophageal dilation    . Cholecystectomy  2007  . Colonoscopy    . Breast lumpectomy with needle localization  07/04/2012    Procedure: BREAST LUMPECTOMY WITH NEEDLE  LOCALIZATION;  Surgeon: [name], MD;  Location: [facility];  Service: General;  Laterality: Left;  left  . Varicose vein surgery  2010    left lower extremity   Family History  Problem Relation Age of Onset  . Cancer Mother 53    melanoma ca  . Arthritis Father    History  Substance Use Topics  . Smoking status: Never Smoker   . Smokeless tobacco: Never Used  . Alcohol Use: No   Lives at home Was living alone, GD spends the night  OB History   Grav Para Term Preterm Abortions TAB SAB Ect Mult Living                 Review of Systems  All other systems reviewed and are negative.    Allergies  Penicillins; Statins; Sulfa drugs cross reactors; Tramadol hcl; Aspirin; and Codeine  Home Medications   Current Outpatient Rx  Name  Route  Sig  Dispense  Refill  . acetaminophen (TYLENOL) 500 MG tablet   Oral   Take 500 mg by mouth every 6 (six) hours as needed. For arthritis         . Cholecalciferol (VITAMIN D-3) 1000 UNITS CAPS   Oral   Take 1 capsule by mouth daily.         Marland Kitchen  losartan-hydrochlorothiazide (HYZAAR) 100-25 MG per tablet   Oral   Take 1 tablet by mouth daily.         . sertraline (ZOLOFT) 100 MG tablet   Oral   Take 100 mg by mouth at bedtime.          . valsartan-hydrochlorothiazide (DIOVAN-HCT) 320-25 MG per tablet   Oral   Take 1 tablet by mouth daily.          BP 153/54  Pulse 62  Temp(Src) 97.5 F (36.4 C) (Oral)  Resp 13  SpO2 91%  Vital signs normal   Orthostatic vital signs show heart rate remains in the 60s however her blood pressure drops from 131/58 to 109/62  Physical Exam  Nursing note and vitals reviewed. Constitutional: She is oriented to person, place, and time. She appears well-developed and well-nourished.  Non-toxic appearance. She does not appear ill. No distress.  HENT:  Head: Normocephalic and atraumatic.  Right Ear: External ear normal.  Left Ear: External ear normal.  Nose:  Nose normal. No mucosal edema or rhinorrhea.  Mouth/Throat: Oropharynx is clear and moist and mucous membranes are normal. No dental abscesses or uvula swelling.  Eyes: Conjunctivae and EOM are normal. Pupils are equal, round, and reactive to light.  Neck: Normal range of motion and full passive range of motion without pain. Neck supple.  Cardiovascular: Normal rate, regular rhythm and normal heart sounds.  Exam reveals no gallop and no friction rub.   No murmur heard. Pulmonary/Chest: Effort normal and breath sounds normal. No respiratory distress. She has no wheezes. She has no rhonchi. She has no rales. She exhibits no tenderness and no crepitus.  Abdominal: Soft. Normal appearance and bowel sounds are normal. She exhibits no distension. There is no tenderness. There is no rebound and no guarding.  Very active BS  Genitourinary:  Old hemorrhoid tags noted, no stool or blood in the vault, swab from my gloved finger was sent to the lab  Musculoskeletal: Normal range of motion. She exhibits no edema and no tenderness.  Moves all extremities well.   Neurological: She is alert and oriented to person, place, and time. She has normal strength. No cranial nerve deficit.  Skin: Skin is warm, dry and intact. No rash noted. No erythema. No pallor.  Psychiatric: She has a normal mood and affect. Her speech is normal and behavior is normal. Her mood appears not anxious.    ED Course  Procedures (including critical care time)  Medications  potassium chloride SA (K-DUR,KLOR-CON) CR tablet 20 mEq (20 mEq Oral Given 05/21/13 1046)   Patient started on oral potassium for her hypokalemia  Pt had no episodes of bleeding while in the ED.  11:00 Dr Elisabeth Pigeon, admit to tele, obs      Op Note      DATE OF PROCEDURE: 09/20/2008 PROCEDURE: Colonoscopy.  INDICATIONS: History of colon polyps.   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 Also of note, there  were diverticula scattered throughout the colon, but interestingly the  biggest and deepest ones were in the transverse colon.  PLAN: Repeat examination in 5 years.  ______________________________  Georgiana Spinner, M.D.      Labs Review Results for orders placed during the hospital encounter of 05/21/13  CBC WITH DIFFERENTIAL      Result Value Range   WBC 5.7  4.0 - 10.5 K/uL   RBC 4.16  3.87 - 5.11 MIL/uL   Hemoglobin 13.5  12.0 - 15.0 g/dL   HCT 16.1  09.6 - 04.5 %   MCV 91.3  78.0 - 100.0 fL   MCH 32.5  26.0 - 34.0 pg   MCHC 35.5  30.0 - 36.0 g/dL   RDW 40.9  81.1 - 91.4 %   Platelets 181  150 - 400 K/uL   Neutrophils Relative % 63  43 - 77 %   Neutro Abs 3.6  1.7 - 7.7 K/uL   Lymphocytes Relative 28  12 - 46 %   Lymphs Abs 1.6  0.7 - 4.0 K/uL   Monocytes Relative 8  3 - 12 %   Monocytes Absolute 0.4  0.1 - 1.0 K/uL   Eosinophils Relative 1  0 - 5 %   Eosinophils Absolute 0.0  0.0 - 0.7 K/uL   Basophils Relative 1  0 - 1 %   Basophils Absolute 0.0  0.0 - 0.1 K/uL  COMPREHENSIVE METABOLIC PANEL      Result Value Range   Sodium 138  135 - 145 mEq/L   Potassium 3.0 (*) 3.5 - 5.1 mEq/L   Chloride 98  96 - 112 mEq/L   CO2 27  19 - 32 mEq/L   Glucose, Bld 117 (*) 70 - 99 mg/dL   BUN 9  6 - 23 mg/dL   Creatinine, Ser 7.82  0.50 - 1.10 mg/dL   Calcium 9.2  8.4 - 95.6 mg/dL   Total Protein 6.4  6.0 - 8.3 g/dL   Albumin 3.2 (*) 3.5 - 5.2 g/dL   AST 15  0 - 37 U/L   ALT 11  0 - 35 U/L   Alkaline Phosphatase 64  39 - 117 U/L   Total Bilirubin 0.4  0.3 - 1.2 mg/dL   GFR calc non Af Amer 78 (*) >90 mL/min   GFR calc Af Amer >90  >90 mL/min  PROTIME-INR      Result Value Range   Prothrombin Time 12.8  11.6 - 15.2 seconds   INR 0.98  0.00 - 1.49  APTT      Result Value Range   aPTT 28  24 - 37 seconds  OCCULT BLOOD, POC DEVICE      Result Value Range   Fecal Occult Bld POSITIVE (*) NEGATIVE  SAMPLE TO BLOOD BANK      Result Value  Range   Blood Bank Specimen SAMPLE AVAILABLE FOR TESTING     Sample Expiration 05/24/2013     Laboratory interpretation all normal except + hemoccult     Imaging Review  Mr Brain Wo Contrast  05/04/2013   CLINICAL DATA:  79 year old female with memory loss. Memory changes. Initial encounter. Query ischemia versus dementia.  EXAM: MRI HEAD WITHOUT CONTRAST   IMPRESSION: No acute intracranial abnormality and largely unremarkable for age non contrast MRI appearance of the brain. There are mild for age signal changes, nonspecific but most commonly due to chronic small vessel disease. No evidence of previous major vascular territory infarct. No regional or disproportionate cerebral volume loss is identified.   Electronically Signed   By: Augusto Gamble M.D.   On: 05/04/2013 18:40     MDM  patient has bright red blood rectal bleeding by history, her Hemoccult was positive without obvious blood in her vault. Her hemoglobin is stable. Her orthostatic vital signs do show a drop in her blood pressure which may be from bleeding or most likely from orthostasis for age.  She has had no further rectal bleeding while in the ED. her last colonoscopy in 2002 and was found and shows she does have diverticuli and AVMs in her rectal area which may be the etiology of her bleeding. Due to her age and her memory problems she is being admitted to observation.    1. Rectal bleeding   2. Hypokalemia     Plan admission   Devoria Albe, MD, Franz Dell, MD 05/21/13 [phone]

## 2013-05-22 DIAGNOSIS — E876 Hypokalemia: Secondary | ICD-10-CM

## 2013-05-22 LAB — CBC
HCT: 36.2 % (ref 36.0–46.0)
Hemoglobin: 12.4 g/dL (ref 12.0–15.0)
MCH: 31.6 pg (ref 26.0–34.0)
MCHC: 34.3 g/dL (ref 30.0–36.0)
MCV: 92.1 fL (ref 78.0–100.0)
RDW: 12.1 % (ref 11.5–15.5)
WBC: 5.5 10*3/uL (ref 4.0–10.5)

## 2013-05-22 LAB — COMPREHENSIVE METABOLIC PANEL
ALT: 11 U/L (ref 0–35)
AST: 15 U/L (ref 0–37)
Albumin: 3 g/dL — ABNORMAL LOW (ref 3.5–5.2)
Alkaline Phosphatase: 61 U/L (ref 39–117)
BUN: 10 mg/dL (ref 6–23)
Calcium: 8.7 mg/dL (ref 8.4–10.5)
Creatinine, Ser: 0.83 mg/dL (ref 0.50–1.10)
Glucose, Bld: 96 mg/dL (ref 70–99)
Potassium: 3.5 mEq/L (ref 3.5–5.1)
Sodium: 139 mEq/L (ref 135–145)
Total Protein: 6 g/dL (ref 6.0–8.3)

## 2013-05-22 NOTE — Care Management Note (Signed)
    Page 1 of 1   05/22/2013     11:42:54 AM   CARE MANAGEMENT NOTE 05/22/2013  Patient:  Kimberly Acevedo, Kimberly Acevedo   Account Number:  [id number]  Date Initiated:  05/22/2013  Documentation initiated by:  Lorenda Ishihara  Subjective/Objective Assessment:   79 yo female admitted with rectal bleeding, abd pain. PTA lived at home alone.     Action/Plan:   Home when stable   Anticipated DC Date:  05/22/2013   Anticipated DC Plan:  HOME/SELF CARE      DC Planning Services  CM consult      Choice offered to / List presented to:             Status of service:  Completed, signed off Medicare Important Message given?   (If response is "NO", the following Medicare IM given date fields will be blank) Date Medicare IM given:   Date Additional Medicare IM given:    Discharge Disposition:  HOME/SELF CARE  Per UR Regulation:  Reviewed for med. necessity/level of care/duration of stay  If discussed at Long Length of Stay Meetings, dates discussed:    Comments:

## 2013-05-22 NOTE — Progress Notes (Signed)
Physical Therapy Treatment Patient Details Name: Kimberly Acevedo MRN: [id number] DOB: 1928/09/19 Today's Date: 05/22/2013 Time: 4098-1191 PT Time Calculation (min): 17 min  PT Assessment / Plan / Recommendation  History of Present Illness 79 yo female admitted with rectal bleeding. Pt lives alone   PT Comments   Progressing with mobility. Pt c/o neck stiffness and L LE "burning-from varicose veins". Pt tolerated mobility well. Plan is for possible d/c home today.   Follow Up Recommendations  No PT follow up     Does the patient have the potential to tolerate intense rehabilitation     Barriers to Discharge        Equipment Recommendations  None recommended by PT    Recommendations for Other Services OT consult  Frequency Min 3X/week   Progress towards PT Goals Progress towards PT goals: Progressing toward goals  Plan Current plan remains appropriate    Precautions / Restrictions Precautions Precautions: Fall Restrictions Weight Bearing Restrictions: No   Pertinent Vitals/Pain L LE "burning" -unrated    Mobility  Bed Mobility Bed Mobility: Supine to Sit Supine to Sit: 6: Modified independent (Device/Increase time);HOB elevated Transfers Transfers: Sit to Stand;Stand to Sit Sit to Stand: 6: Modified independent (Device/Increase time) Stand to Sit: 6: Modified independent (Device/Increase time) Ambulation/Gait Ambulation/Gait Assistance: 5: Supervision Ambulation Distance (Feet): 350 Feet Assistive device: None Ambulation/Gait Assistance Details: slow gait speed.  Gait Pattern: Step-through pattern Stairs: Yes Stairs Assistance: 4: Min guard Stair Management Technique: One rail Right;Step to pattern;Forwards Number of Stairs: 3    Exercises     PT Diagnosis:    PT Problem List:   PT Treatment Interventions:     PT Goals (current goals can now be found in the care plan section)    Visit Information  Last PT Received On: 05/22/13 Assistance Needed:  +1 History of Present Illness: 79 yo female admitted with rectal bleeding. Pt lives alone    Subjective Data      Cognition  Cognition Arousal/Alertness: Awake/alert Behavior During Therapy: WFL for tasks assessed/performed Overall Cognitive Status: Within Functional Limits for tasks assessed    Balance     End of Session PT - End of Session Equipment Utilized During Treatment: Gait belt Activity Tolerance: Patient tolerated treatment well Patient left: in chair;with call bell/phone within reach;with family/visitor present   GP     Rebeca Alert, MPT Pager: [phone]

## 2013-05-22 NOTE — Discharge Summary (Signed)
Physician Discharge Summary  LAILANY ENOCH ZOX:[id number] DOB: 14-Jan-1929 DOA: 05/21/2013  PCP: [name], MD  Admit date: 05/21/2013 Discharge date: 05/22/2013  Time spent: > 35 minutes  Recommendations for Outpatient Follow-up:  1. Pt to avoid any aspirin or nsaids 2. If patient has not had routine colonoscopy recently she will need to be scheduled for one.  Discharge Diagnoses:  Principal Problem:   Rectal bleeding Active Problems:   Hypertension   Fibromyalgia   Hypokalemia   Depression   Discharge Condition: stable  Diet recommendation: low sodium heart healthy with fiber supplement  Filed Weights   05/21/13 1206 05/22/13 0456  Weight: 71.94 kg (158 lb 9.6 oz) 71.9 kg (158 lb 8.2 oz)    History of present illness:  79 y/o with PMH of HTN, Depression, and history of hemorrhoids (per patient) who presented to the ED complaining of rectral bleeding  Hospital Course:  Principal Problem:   Rectal bleeding - None while in hospital - h/h steady - recommended f/u with primary care physician for further work up recommendations pending prior GI work up in the past. If patient has not had a routine colonoscopy she will need to have one scheduled. - No pain or active bleeding on discharge.  Active Problems:   Hypertension - stable. Two different ARBs and HCTZ combination pills listed on home regimen.  Will discharge on valsartan-hctz combination pill. - patient to clarify with pcp    Fibromyalgia - Pt to avoid NSAIDs or ASA    Hypokalemia - resolved on discharge with last value 3.5    Depression - stable continue home regimen  Procedures:  None  Consultations:  none  Discharge Exam: Filed Vitals:   05/22/13 0456  BP: 119/42  Pulse: 57  Temp: 98.1 F (36.7 C)  Resp: 18    General: Pt in NAD, alert and awake Cardiovascular: RRR, no mRG Respiratory: CTA BL, no wheezes ABdomen: soft, NT, ND  Discharge Instructions  Discharge Orders    Future Appointments Provider Department Dept Phone   05/24/2013 1:15 PM [facility] [phone]   05/24/2013 1:45 PM Illa Level, NP [facility] [phone]   Future Orders Complete By Expires   Call MD for:  extreme fatigue  As directed    Call MD for:  persistant nausea and vomiting  As directed    Call MD for:  severe uncontrolled pain  As directed    Call MD for:  temperature >100.4  As directed    Diet - low sodium heart healthy  As directed    Discharge instructions  As directed    Comments:     Please avoid any aspirin or NSAIDs.  Follow up with your primary care physician in 1-2 weeks or sooner should any new concerns arise.   Increase activity slowly  As directed        Medication List    STOP taking these medications       losartan-hydrochlorothiazide 100-25 MG per tablet  Commonly known as:  HYZAAR      TAKE these medications       acetaminophen 500 MG tablet  Commonly known as:  TYLENOL  Take 500 mg by mouth every 6 (six) hours as needed. For arthritis     sertraline 100 MG tablet  Commonly known as:  ZOLOFT  Take 100 mg by mouth at bedtime.     valsartan-hydrochlorothiazide 320-25 MG per tablet  Commonly  known as:  DIOVAN-HCT  Take 1 tablet by mouth daily.     Vitamin D-3 1000 UNITS Caps  Take 1 capsule by mouth daily.       Allergies  Allergen Reactions  . Penicillins Swelling and Rash  . Statins   . Sulfa Drugs Cross Reactors Shortness Of Breath  . Tramadol Hcl Anaphylaxis  . Aspirin Swelling  . Codeine Other (See Comments)    unknown      The results of significant diagnostics from this hospitalization (including imaging, microbiology, ancillary and laboratory) are listed below for reference.    Significant Diagnostic Studies: Mr Sherrin Daisy Contrast  05/04/2013   CLINICAL DATA:  79 year old female with memory loss. Memory changes. Initial encounter.  Query ischemia versus dementia.  EXAM: MRI HEAD WITHOUT CONTRAST  TECHNIQUE: Multiplanar, multiecho pulse sequences of the brain and surrounding structures were obtained without intravenous contrast.  COMPARISON:  Head CT without contrast 01/26/2010.  FINDINGS: Cerebral volume is within normal limits for age. No restricted diffusion to suggest acute infarction. No midline shift, mass effect, evidence of mass lesion, ventriculomegaly, extra-axial collection or acute intracranial hemorrhage. Cervicomedullary junction and pituitary are within normal limits. Negative visualized cervical spine. Major intracranial vascular flow voids are preserved.  No cortical encephalomalacia identified. There are scattered but mild for age foci of T2 and FLAIR hyperintensity in the cerebral white matter, nonspecific. There is a solitary chronic micro hemorrhage suggested in the left anterior frontal lobe subcortical white matter on series 7, image 32. Deep gray matter nuclei, brainstem, and cerebellum are within normal limits for age.  Mastoids are clear. Grossly normal visible internal auditory structures. Negative paranasal sinuses. Postoperative changes to the globes. Normal bone marrow signal. Visualized scalp soft tissues are within normal limits.  IMPRESSION: No acute intracranial abnormality and largely unremarkable for age non contrast MRI appearance of the brain. There are mild for age signal changes, nonspecific but most commonly due to chronic small vessel disease. No evidence of previous major vascular territory infarct. No regional or disproportionate cerebral volume loss is identified.   Electronically Signed   By: Augusto Gamble M.D.   On: 05/04/2013 18:40    Microbiology: No results found for this or any previous visit (from the past 240 hour(s)).   Labs: Basic Metabolic Panel:  Recent Labs Lab 05/21/13 0515 05/21/13 0906 05/22/13 0534  NA  --  138 139  K  --  3.0* 3.5  CL  --  98 103  CO2  --  27 27   GLUCOSE  --  117* 96  BUN  --  9 10  CREATININE  --  0.71 0.83  CALCIUM  --  9.2 8.7  MG 1.9  --   --   PHOS 3.9  --   --    Liver Function Tests:  Recent Labs Lab 05/21/13 0906 05/22/13 0534  AST 15 15  ALT 11 11  ALKPHOS 64 61  BILITOT 0.4 0.3  PROT 6.4 6.0  ALBUMIN 3.2* 3.0*   No results found for this basename: LIPASE, AMYLASE,  in the last 168 hours No results found for this basename: AMMONIA,  in the last 168 hours CBC:  Recent Labs Lab 05/21/13 0906 05/22/13 0534  WBC 5.7 5.5  NEUTROABS 3.6  --   HGB 13.5 12.4  HCT 38.0 36.2  MCV 91.3 92.1  PLT 181 180   Cardiac Enzymes: No results found for this basename: CKTOTAL, CKMB, CKMBINDEX, TROPONINI,  in the last 168 hours  BNP: BNP (last 3 results) No results found for this basename: PROBNP,  in the last 8760 hours CBG: No results found for this basename: GLUCAP,  in the last 168 hours     Signed:  Penny Pia  [facility] 05/22/2013, 1:18 PM

## 2013-05-22 NOTE — Progress Notes (Signed)
Patient discharged to home. Dc instructions given. No concerns voiced. Left unit in wheelchair pushed by nurse tech. Left in good condition. Vwilliams,rn.

## 2013-05-22 NOTE — Progress Notes (Signed)
Nutrition Brief Note  Patient identified via consult.   Wt Readings from Last 5 Encounters:  05/22/13 158 lb 8.2 oz (71.9 kg)  11/22/12 168 lb (76.204 kg)  11/22/12 167 lb 6.4 oz (75.932 kg)  09/14/12 167 lb 6.4 oz (75.932 kg)  09/04/12 167 lb 9.6 oz (76.023 kg)    Body mass index is 24.11 kg/(m^2). Patient meets criteria for normal weight based on current BMI.   Current diet order is heart healthy, patient is consuming approximately 100% of meals at this time. Labs and medications reviewed. Admitted with rectal bleed. Met with pt who reports eating well PTA, 3 meals/day with good appetite and stable weight. Pt to be d/c today. Pt without any nutrition concerns.   No nutrition interventions warranted at this time. If nutrition issues arise, please consult RD.   Levon Hedger MS, RD, LDN [phone] Pager [phone] After Hours Pager

## 2013-05-24 ENCOUNTER — Other Ambulatory Visit: Payer: Self-pay | Admitting: Emergency Medicine

## 2013-05-24 ENCOUNTER — Encounter: Payer: Medicare Other | Admitting: Adult Health

## 2013-05-24 ENCOUNTER — Other Ambulatory Visit: Payer: Medicare Other

## 2013-05-24 DIAGNOSIS — C50919 Malignant neoplasm of unspecified site of unspecified female breast: Secondary | ICD-10-CM

## 2013-05-25 ENCOUNTER — Telehealth: Payer: Self-pay | Admitting: Oncology

## 2013-05-25 LAB — GLUCOSE, CAPILLARY: Glucose-Capillary: 88 mg/dL (ref 70–99)

## 2013-05-25 NOTE — Telephone Encounter (Signed)
, °

## 2013-05-26 NOTE — Progress Notes (Signed)
This encounter was created in error - please disregard.

## 2013-11-12 ENCOUNTER — Ambulatory Visit: Payer: Medicare Other | Admitting: Neurology

## 2014-02-26 ENCOUNTER — Encounter: Payer: Self-pay | Admitting: Neurology

## 2014-02-26 ENCOUNTER — Ambulatory Visit (INDEPENDENT_AMBULATORY_CARE_PROVIDER_SITE_OTHER): Payer: Medicare Other | Admitting: Neurology

## 2014-02-26 VITALS — BP 123/64 | HR 69 | Ht 68.0 in | Wt 156.8 lb

## 2014-02-26 DIAGNOSIS — F03918 Unspecified dementia, unspecified severity, with other behavioral disturbance: Secondary | ICD-10-CM

## 2014-02-26 DIAGNOSIS — F0391 Unspecified dementia with behavioral disturbance: Secondary | ICD-10-CM

## 2014-02-26 NOTE — Patient Instructions (Signed)
Overall you are doing fairly well but I do want to suggest a few things today:   Remember to drink plenty of fluid, eat healthy meals and do not skip any meals. Try to eat protein with a every meal and eat a healthy snack such as fruit or nuts in between meals. Try to keep a regular sleep-wake schedule and try to exercise daily, particularly in the form of walking, 20-30 minutes a day, if you can.   As far as your medications are concerned, I would like to suggest continue Aricept  As far as diagnostic testing: Neuropsychiatric testing  I would like to see you back in 6 months, sooner if we need to. Please call us with any interim questions, concerns, problems, updates or refill requests.   Please also call us for any test results so we can go over those with you on the phone.  My clinical assistant and will answer any of your questions and relay your messages to me and also relay most of my messages to you.   Our phone number is [phone]. We also have an after hours call service for urgent matters and there is a physician on-call for urgent questions. For any emergencies you know to call 911 or go to the nearest emergency room

## 2014-02-26 NOTE — Progress Notes (Signed)
[facility]    Provider:  Dr Jaynee Eagles Referring Provider: [name],* Primary Care Physician:  [name], MD  CC:  Memory loss  HPI:  Kimberly Acevedo is a 80 y.o. female here as a referral from Dr. Shelia Media for memory loss  Memory problems for at least 2 years. Lives in her house but needs 24 hours of care. Poor historian. Most of history from caretaker who is here with patient. Patient doesn't notice anything is wrong with memory. She starts to do things in the kitchen and forgets what she is there for. She can recite where she lived as a child but can't remember people she just met. Not social, decreased interest in socializing. Daughter lives next door. She doesn't rememember how often daughter comes. Sleeps until 11am goes to bed at 11pm. No hallucinations or delusions. Is agitated often. Memory problems progressing. Most of the history comes from caretaker. More confused at night. No seizure-like episodes, LOC or focal neurologic deficits. She is here as a requirement for [employer] and needs "pictures of the brain". No cooking, no cleaning, no driving, not paying bills  Because she can't write a check anymore, checks are wrong. Showering, grooming, toileting independently. No head trauma, no strokes or known neurologic insults. Negative family history of neurodegenerative disorders. No abnormal movements. Slowly progressive.  Getting lost going to places she knows well. Patient gets upset when caretaker says she becomes agitated. Per caretaker she curses sometimes when she gets frustrated. No focal neurologic deficits.  Reviewed notes, labs and imaging from outside physicians, which showed: Personally reviewed MRi of the brsain images: Unremarkable, no large ischemic strokes, masses, tumors, only some mild non-specific white matter changes likely ischemic small vessel disease. CBC, CMP unremarkable (low potassium)  Review of Systems: Patient  complains of symptoms per HPI as well as the following symptoms memor loss, no vision changes, no weakness, no slurred speech, no pain. Pertinent negatives per HPI. All others negative.   History   Social History  . Marital Status: Widowed    Spouse Name: N/A    Number of Children: 1  . Years of Education: 11   Occupational History  . interior design   . Retired     Social History Main Topics  . Smoking status: Never Smoker   . Smokeless tobacco: Never Used  . Alcohol Use: No  . Drug Use: No  . Sexual Activity: Not Currently   Other Topics Concern  . Not on file   Social History Narrative   Patient lives at home alone with 2 caregivers.    Patient is retired.    Patient has 1 child.    Patient has an 11th grade education.    Patient is right handed.     Family History  Problem Relation Age of Onset  . Cancer Mother 75    melanoma ca  . Arthritis Father   . Dementia Neg Hx     Past Medical History  Diagnosis Date  . Fibromyalgia   . Dyslipidemia   . History of chest pain   . Achalasia, esophageal     history of  . History of dizziness   . History of fatigue   . Hypertension   . Hemorrhoids   . Arthritis   . Diverticulitis   . Kidney problem     spot on kidney, pt  says being elevated in April 2013  . Acid reflux   . Sleep apnea  uses a cpap  . Radiation 08/10/12-09/08/2012    Left breast 750 cGy in 3 sessions    Past Surgical History  Procedure Laterality Date  . Rectocele repair    . Total abdominal hysterectomy    . Esophageal dilation    . Cholecystectomy  2007  . Colonoscopy    . Breast lumpectomy with needle localization  07/04/2012    Procedure: BREAST LUMPECTOMY WITH NEEDLE LOCALIZATION;  Surgeon: Edward Jolly, MD;  Location: [place];  Service: General;  Laterality: Left;  left  . Varicose vein surgery  2010    left lower extremity    Current Outpatient Prescriptions  Medication Sig Dispense Refill  .  acetaminophen (TYLENOL) 500 MG tablet Take 500 mg by mouth every 6 (six) hours as needed. For arthritis      . Cholecalciferol (VITAMIN D-3) 1000 UNITS CAPS Take 1 capsule by mouth daily.      Marland Kitchen DILT-XR 240 MG 24 hr capsule       . diltiazem (CARDIZEM CD) 240 MG 24 hr capsule       . donepezil (ARICEPT) 10 MG tablet       . losartan-hydrochlorothiazide (HYZAAR) 100-25 MG per tablet       . NAMENDA XR 21 MG CP24       . PROCTOSOL HC 2.5 % rectal cream       . sertraline (ZOLOFT) 100 MG tablet Take 100 mg by mouth at bedtime.       . valsartan-hydrochlorothiazide (DIOVAN-HCT) 320-25 MG per tablet Take 1 tablet by mouth daily.       No current facility-administered medications for this visit.    Allergies as of 02/26/2014 - Review Complete 02/26/2014  Allergen Reaction Noted  . Penicillins Swelling and Rash 09/29/2010  . Statins  08/30/2011  . Sulfa drugs cross reactors Shortness Of Breath 09/29/2010  . Tramadol hcl Anaphylaxis 05/03/2011  . Aspirin Swelling 09/29/2010  . Codeine Other (See Comments) 09/29/2010    Vitals: BP 123/64  Pulse 69  Ht $R'5\' 8"'cX$  (1.727 m)  Wt 156 lb 12.8 oz (71.124 kg)  BMI 23.85 kg/m2 Last Weight:  Wt Readings from Last 1 Encounters:  02/26/14 156 lb 12.8 oz (71.124 kg)   Last Height:   Ht Readings from Last 1 Encounters:  02/26/14 $RemoveB'5\' 8"'OVHCsIQu$  (1.727 m)    Physical exam: Exam: Gen: NAD, conversant, well nourised, well groomed                     CV: RRR, no MRG. No Carotid Bruits. No peripheral edema, warm, nontender Eyes: Conjunctivae clear without exudates or hemorrhage  Neuro: Detailed Neurologic Exam  Speech:    Speech is normal; fluent and spontaneous with normal comprehension.  Cognition: MoCA 14/30    The patient is oriented to person, place, and time;     Recent memory impaired and remote memory intact;     language fluent;     Impaired attention, concentration,     fund of knowledge impaired for recent events Cranial Nerves:    The  pupils are equal, round, and reactive to light. The fundi are normal and spontaneous venous pulsations are present. Visual fields are full to finger confrontation. Extraocular movements are intact. Trigeminal sensation is intact and the muscles of mastication are normal. The face is symmetric. The palate elevates in the midline. Voice is normal. Shoulder shrug is normal. The tongue has normal motion without fasciculations.   Coordination:  Normal finger to nose and heel to shin. Normal rapid alternating movements.   Gait:    Heel-toe and tandem gait are normal.   Motor Observation:    No asymmetry, no atrophy, and no involuntary movements noted. Tone:    Normal muscle tone.    Posture:    Posture is normal. normal erect    Strength:    Strength is V/V in the upper and lower limbs.      Sensation: intact     Reflex Exam:  DTR's:    Deep tendon reflexes in the upper and lower extremities are brisk bilaterally.   Toes:    The toes are upgoing bilaterally.   Clonus:    Clonus is absent.      Assessment/Plan:  80 year old with severe cognitive dysfunction(MoCA 14/30) most likely alzheimer-type dementia. He MRI of brain was largely unremarkable especially for age and her neuro exam only significant for upgoing toes and brisk reflexes for age (both can be seen with dementia). Will order a memory disorders serum screen. Will also refer to neuropsychiatric testing. Continue aricept. Given cognitive impairments, she likely needs 24 hour support of a caregiver for safety. Seroquel for behavioral problems.   Sarina Ill, MD  [facility] [address] [place], [place] [address]  Phone [phone] Fax [phone]

## 2014-02-27 ENCOUNTER — Encounter: Payer: Self-pay | Admitting: Neurology

## 2014-02-27 DIAGNOSIS — F03918 Unspecified dementia, unspecified severity, with other behavioral disturbance: Secondary | ICD-10-CM | POA: Insufficient documentation

## 2014-02-27 DIAGNOSIS — F0391 Unspecified dementia with behavioral disturbance: Secondary | ICD-10-CM | POA: Insufficient documentation

## 2014-02-28 ENCOUNTER — Telehealth: Payer: Self-pay | Admitting: Neurology

## 2014-02-28 LAB — VITAMIN B12

## 2014-02-28 LAB — METHYLMALONIC ACID, SERUM: Methylmalonic Acid: 142 nmol/L (ref 0–378)

## 2014-02-28 LAB — THYROGLOBULIN ANTIBODY

## 2014-02-28 LAB — TSH: TSH: 1.83 u[IU]/mL (ref 0.450–4.500)

## 2014-02-28 LAB — RPR: RPR: NONREACTIVE

## 2014-02-28 LAB — VITAMIN B1, WHOLE BLOOD: Thiamine: 147.9 nmol/L (ref 66.5–200.0)

## 2014-02-28 LAB — THYROID PEROXIDASE ANTIBODY

## 2014-02-28 LAB — FOLATE: Folate: 11.4 ng/mL (ref >3.0)

## 2014-02-28 NOTE — Telephone Encounter (Signed)
Would you call and let her know her labs were normal please? Thank you

## 2014-03-01 NOTE — Telephone Encounter (Signed)
Spoke to daughter. Gave results.

## 2014-04-26 ENCOUNTER — Emergency Department (HOSPITAL_COMMUNITY)
Admission: EM | Admit: 2014-04-26 | Discharge: 2014-04-26 | Disposition: A | Discharge status: Home / Self Care | Payer: Medicare Other | Attending: Emergency Medicine | Admitting: Emergency Medicine

## 2014-04-26 ENCOUNTER — Encounter (HOSPITAL_COMMUNITY): Payer: Self-pay | Admitting: Emergency Medicine

## 2014-04-26 DIAGNOSIS — I10 Essential (primary) hypertension: Secondary | ICD-10-CM | POA: Diagnosis not present

## 2014-04-26 DIAGNOSIS — K625 Hemorrhage of anus and rectum: Secondary | ICD-10-CM | POA: Diagnosis present

## 2014-04-26 DIAGNOSIS — Z79899 Other long term (current) drug therapy: Secondary | ICD-10-CM | POA: Diagnosis not present

## 2014-04-26 DIAGNOSIS — Z8719 Personal history of other diseases of the digestive system: Secondary | ICD-10-CM | POA: Insufficient documentation

## 2014-04-26 DIAGNOSIS — Z8739 Personal history of other diseases of the musculoskeletal system and connective tissue: Secondary | ICD-10-CM | POA: Diagnosis not present

## 2014-04-26 DIAGNOSIS — Z8639 Personal history of other endocrine, nutritional and metabolic disease: Secondary | ICD-10-CM | POA: Diagnosis not present

## 2014-04-26 DIAGNOSIS — Z880 Allergy status to penicillin: Secondary | ICD-10-CM | POA: Diagnosis not present

## 2014-04-26 LAB — COMPREHENSIVE METABOLIC PANEL
ALT: 15 U/L (ref 0–35)
AST: 18 U/L (ref 0–37)
Albumin: 3.5 g/dL (ref 3.5–5.2)
Alkaline Phosphatase: 87 U/L (ref 39–117)
Anion gap: 14 (ref 5–15)
BILIRUBIN TOTAL: 0.2 mg/dL — AB (ref 0.3–1.2)
BUN: 12 mg/dL (ref 6–23)
CHLORIDE: 99 meq/L (ref 96–112)
CO2: 28 meq/L (ref 19–32)
CREATININE: 0.99 mg/dL (ref 0.50–1.10)
Calcium: 9.6 mg/dL (ref 8.4–10.5)
GFR calc Af Amer: 59 mL/min — ABNORMAL LOW (ref >90)
GFR, EST NON AFRICAN AMERICAN: 51 mL/min — AB (ref >90)
Glucose, Bld: 101 mg/dL — ABNORMAL HIGH (ref 70–99)
POTASSIUM: 3.9 meq/L (ref 3.7–5.3)
Sodium: 141 mEq/L (ref 137–147)
Total Protein: 7 g/dL (ref 6.0–8.3)

## 2014-04-26 LAB — POC OCCULT BLOOD, ED: FECAL OCCULT BLD: NEGATIVE

## 2014-04-26 LAB — CBC
HEMATOCRIT: 43.4 % (ref 36.0–46.0)
Hemoglobin: 14.7 g/dL (ref 12.0–15.0)
MCH: 31.3 pg (ref 26.0–34.0)
MCHC: 33.9 g/dL (ref 30.0–36.0)
MCV: 92.5 fL (ref 78.0–100.0)
PLATELETS: 198 10*3/uL (ref 150–400)
RBC: 4.69 MIL/uL (ref 3.87–5.11)
RDW: 12.5 % (ref 11.5–15.5)
WBC: 9.1 10*3/uL (ref 4.0–10.5)

## 2014-04-26 LAB — PROTIME-INR
INR: 0.95 (ref 0.00–1.49)
PROTHROMBIN TIME: 12.8 s (ref 11.6–15.2)

## 2014-04-26 MED ORDER — SODIUM CHLORIDE 0.9 % IV BOLUS (SEPSIS)
500.0000 mL | Freq: Once | INTRAVENOUS | Status: AC
  Administered 2014-04-26: 500 mL via INTRAVENOUS

## 2014-04-26 NOTE — ED Notes (Signed)
Pt presents with c/o blood in stool x 2 days, patient and caregivers report dark and bright red blood noted. Pt c/o abd distention, pain with rectal pain. Pt was given stool softeners yesterday to assist with constipation.

## 2014-04-26 NOTE — ED Provider Notes (Addendum)
CSN: [id number]     Arrival date & time 04/26/14  2014 History   First MD Initiated Contact with Patient 04/26/14 2027     Chief Complaint  Patient presents with  . Rectal Bleeding     (Consider location/radiation/quality/duration/timing/severity/associated sxs/prior Treatment) Patient is a 80 y.o. female presenting with hematochezia. The history is provided by the patient and a caregiver.  Rectal Bleeding Associated symptoms: no abdominal pain, no fever, no light-headedness and no vomiting   pt presents w c/o rectal bleeding onset today.  Pt states she thought her stool appeared darker than normal in past couple days, then today brbpr.  States recent mild constipation/straining. Had bm today. Hx hemorrhoids. Also w hx diverticula. Denies abd pain. No nv. Normal appetite. Denies anticoagulant use. Denies nsaid use. No faintness or dizziness. No lightheadedness upon standing. Denies fever or chills. No rectal pain.       Past Medical History  Diagnosis Date  . Fibromyalgia   . Dyslipidemia   . History of chest pain   . Achalasia, esophageal     history of  . History of dizziness   . History of fatigue   . Hypertension   . Hemorrhoids   . Arthritis   . Diverticulitis   . Kidney problem     spot on kidney, pt  says being elevated in April 2013  . Acid reflux   . Sleep apnea     uses a cpap  . Radiation 08/10/12-09/08/2012    Left breast 750 cGy in 3 sessions   Past Surgical History  Procedure Laterality Date  . Rectocele repair    . Total abdominal hysterectomy    . Esophageal dilation    . Cholecystectomy  2007  . Colonoscopy    . Breast lumpectomy with needle localization  07/04/2012    Procedure: BREAST LUMPECTOMY WITH NEEDLE LOCALIZATION;  Surgeon: Edward Jolly, MD;  Location: [place];  Service: General;  Laterality: Left;  left  . Varicose vein surgery  2010    left lower extremity   Family History  Problem Relation Age of Onset  . Cancer  Mother 42    melanoma ca  . Arthritis Father   . Dementia Neg Hx    History  Substance Use Topics  . Smoking status: Never Smoker   . Smokeless tobacco: Never Used  . Alcohol Use: No   OB History    No data available     Review of Systems  Constitutional: Negative for fever and chills.  HENT: Negative for sore throat.   Eyes: Negative for redness.  Respiratory: Negative for shortness of breath.   Cardiovascular: Negative for chest pain.  Gastrointestinal: Positive for blood in stool and hematochezia. Negative for nausea, vomiting, abdominal pain and diarrhea.  Genitourinary: Negative for hematuria and flank pain.  Musculoskeletal: Negative for back pain and neck pain.  Skin: Negative for rash.  Neurological: Negative for syncope, light-headedness and headaches.  Hematological: Does not bruise/bleed easily.  Psychiatric/Behavioral: Negative for confusion.      Allergies  Penicillins; Statins; Sulfa drugs cross reactors; Tramadol hcl; Aspirin; and Codeine  Home Medications   Prior to Admission medications   Medication Sig Start Date End Date Taking? Authorizing Provider  acetaminophen (TYLENOL) 500 MG tablet Take 500 mg by mouth every 6 (six) hours as needed. For arthritis    Historical Provider, MD  Cholecalciferol (VITAMIN D-3) 1000 UNITS CAPS Take 1 capsule by mouth daily.    Historical Provider, MD  DILT-XR 240 MG 24 hr capsule  01/08/14   Historical Provider, MD  diltiazem (CARDIZEM CD) 240 MG 24 hr capsule  01/08/14   Historical Provider, MD  donepezil (ARICEPT) 10 MG tablet  12/19/13   Historical Provider, MD  losartan-hydrochlorothiazide (HYZAAR) 100-25 MG per tablet  01/11/14   Historical Provider, MD  NAMENDA XR 21 MG CP24  02/19/14   Historical Provider, MD  PROCTOSOL HC 2.5 % rectal cream  01/31/14   Historical Provider, MD  sertraline (ZOLOFT) 100 MG tablet Take 100 mg by mouth at bedtime.     Historical Provider, MD  valsartan-hydrochlorothiazide (DIOVAN-HCT) 320-25  MG per tablet Take 1 tablet by mouth daily.    Historical Provider, MD   BP 148/62 mmHg  Pulse 88  Temp(Src) 98.3 F (36.8 C) (Oral)  Resp 18  Ht 5\' 6"  (1.676 m)  Wt 145 lb (65.772 kg)  BMI 23.41 kg/m2  SpO2 97% Physical Exam  Constitutional: She appears well-developed and well-nourished. No distress.  HENT:  Mouth/Throat: Oropharynx is clear and moist.  Eyes: Conjunctivae are normal. No scleral icterus.  Neck: Neck supple. No tracheal deviation present.  Cardiovascular: Normal rate, regular rhythm, normal heart sounds and intact distal pulses.   Pulmonary/Chest: Effort normal and breath sounds normal. No respiratory distress.  Abdominal: Soft. Normal appearance and bowel sounds are normal. She exhibits no distension and no mass. There is no tenderness. There is no rebound and no guarding.  Genitourinary:  No cva tenderness. Old ext hem, no bleeding. Minimal stool on rectal exam, and no gross blood currently. No mass felt.   Musculoskeletal: She exhibits no edema.  Neurological: She is alert.  Skin: Skin is warm and dry. No rash noted. She is not diaphoretic.  Psychiatric: She has a normal mood and affect.  Nursing note and vitals reviewed.   ED Course  Procedures (including critical care time) Labs Review  Results for orders placed or performed during the hospital encounter of 04/26/14  CBC  Result Value Ref Range   WBC 9.1 4.0 - 10.5 K/uL   RBC 4.69 3.87 - 5.11 MIL/uL   Hemoglobin 14.7 12.0 - 15.0 g/dL   HCT 43.4 36.0 - 46.0 %   MCV 92.5 78.0 - 100.0 fL   MCH 31.3 26.0 - 34.0 pg   MCHC 33.9 30.0 - 36.0 g/dL   RDW 12.5 11.5 - 15.5 %   Platelets 198 150 - 400 K/uL  Comprehensive metabolic panel  Result Value Ref Range   Sodium 141 137 - 147 mEq/L   Potassium 3.9 3.7 - 5.3 mEq/L   Chloride 99 96 - 112 mEq/L   CO2 28 19 - 32 mEq/L   Glucose, Bld 101 (H) 70 - 99 mg/dL   BUN 12 6 - 23 mg/dL   Creatinine, Ser 0.99 0.50 - 1.10 mg/dL   Calcium 9.6 8.4 - 10.5 mg/dL    Total Protein 7.0 6.0 - 8.3 g/dL   Albumin 3.5 3.5 - 5.2 g/dL   AST 18 0 - 37 U/L   ALT 15 0 - 35 U/L   Alkaline Phosphatase 87 39 - 117 U/L   Total Bilirubin 0.2 (L) 0.3 - 1.2 mg/dL   GFR calc non Af Amer 51 (L) >90 mL/min   GFR calc Af Amer 59 (L) >90 mL/min   Anion gap 14 5 - 15  Protime-INR  Result Value Ref Range   Prothrombin Time 12.8 11.6 - 15.2 seconds   INR 0.95 0.00 - 1.49  POC occult blood, ED Provider will collect  Result Value Ref Range   Fecal Occult Bld NEGATIVE NEGATIVE      MDM   Iv ns. Labs.   Reviewed nursing notes and prior charts for additional history.   Recheck abd soft nt. No nv.   No rectal bleeding in ED.  Both rectal exam in ED, as well as stool sample from home, test heme neg for blood.  hgb 14.7.  Vitals normal.  Pt currently appears stable for d/c.  Discussed diff dx incl anal fissue, hemorrhoid, diverticula related bleeding, mass, and to follow up closely w pcp, and to return if worse, recurrent bleeding, abd pain, fevers, weak/faint or vomiting.        Mirna Mires, MD 04/26/14 2215

## 2014-04-26 NOTE — Discharge Instructions (Signed)
It was our pleasure to provide your ER care today - we hope that you feel better.  If hard/firm stools, try prune juice or apple juice and take stool softener (colace). If constipated, try miralax (laxative that is available over the counter).  Follow up with primary care doctor in coming week.  Return to ER if worse, new symptoms, recurrent or persistent rectal bleeding, severe abdominal pain, weak/faint, fevers, other concern.     Rectal Bleeding Rectal bleeding is when blood passes out of the anus. It is usually a sign that something is wrong. It may not be serious, but it should always be evaluated. Rectal bleeding may present as bright red blood or extremely dark stools. The color may range from dark red or maroon to black (like tar). It is important that the cause of rectal bleeding be identified so treatment can be started and the problem corrected. CAUSES   Hemorrhoids. These are enlarged (dilated) blood vessels or veins in the anal or rectal area.  Fistulas. Theseare abnormal, burrowing channels that usually run from inside the rectum to the skin around the anus. They can bleed.  Anal fissures. This is a tear in the tissue of the anus. Bleeding occurs with bowel movements.  Diverticulosis. This is a condition in which pockets or sacs project from the bowel wall. Occasionally, the sacs can bleed.  Diverticulitis. Thisis an infection involving diverticulosis of the colon.  Proctitis and colitis. These are conditions in which the rectum, colon, or both, can become inflamed and pitted (ulcerated).  Polyps and cancer. Polyps are non-cancerous (benign) growths in the colon that may bleed. Certain types of polyps turn into cancer.  Protrusion of the rectum. Part of the rectum can project from the anus and bleed.  Certain medicines.  Intestinal infections.  Blood vessel abnormalities. HOME CARE INSTRUCTIONS  Eat a high-fiber diet to keep your stool soft.  Limit  activity.  Drink enough fluids to keep your urine clear or pale yellow.  Warm baths may be useful to soothe rectal pain.  Follow up with your caregiver as directed. SEEK IMMEDIATE MEDICAL CARE IF:  You develop increased bleeding.  You have black or dark red stools.  You vomit blood or material that looks like coffee grounds.  You have abdominal pain or tenderness.  You have a fever.  You feel weak, nauseous, or you faint.  You have severe rectal pain or you are unable to have a bowel movement. MAKE SURE YOU:  Understand these instructions.  Will watch your condition.  Will get help right away if you are not doing well or get worse. Document Released: 11/13/2001 Document Revised: 08/16/2011 Document Reviewed: 11/08/2010 [facility] Patient Information 2015 [place], [place]. This information is not intended to replace advice given to you by your health care provider. Make sure you discuss any questions you have with your health care provider.    Constipation Constipation is when a person has fewer than three bowel movements a week, has difficulty having a bowel movement, or has stools that are dry, hard, or larger than normal. As people grow older, constipation is more common. If you try to fix constipation with medicines that make you have a bowel movement (laxatives), the problem may get worse. Long-term laxative use may cause the muscles of the colon to become weak. A low-fiber diet, not taking in enough fluids, and taking certain medicines may make constipation worse.  CAUSES   Certain medicines, such as antidepressants, pain medicine, iron supplements, antacids, and  water pills.   Certain diseases, such as diabetes, irritable bowel syndrome (IBS), thyroid disease, or depression.   Not drinking enough water.   Not eating enough fiber-rich foods.   Stress or travel.   Lack of physical activity or exercise.   Ignoring the urge to have a bowel movement.   Using  laxatives too much.  SIGNS AND SYMPTOMS   Having fewer than three bowel movements a week.   Straining to have a bowel movement.   Having stools that are hard, dry, or larger than normal.   Feeling full or bloated.   Pain in the lower abdomen.   Not feeling relief after having a bowel movement.  DIAGNOSIS  Your health care provider will take a medical history and perform a physical exam. Further testing may be done for severe constipation. Some tests may include:  A barium enema X-ray to examine your rectum, colon, and, sometimes, your small intestine.   A sigmoidoscopy to examine your lower colon.   A colonoscopy to examine your entire colon. TREATMENT  Treatment will depend on the severity of your constipation and what is causing it. Some dietary treatments include drinking more fluids and eating more fiber-rich foods. Lifestyle treatments may include regular exercise. If these diet and lifestyle recommendations do not help, your health care provider may recommend taking over-the-counter laxative medicines to help you have bowel movements. Prescription medicines may be prescribed if over-the-counter medicines do not work.  HOME CARE INSTRUCTIONS   Eat foods that have a lot of fiber, such as fruits, vegetables, whole grains, and beans.  Limit foods high in fat and processed sugars, such as french fries, hamburgers, cookies, candies, and soda.   A fiber supplement may be added to your diet if you cannot get enough fiber from foods.   Drink enough fluids to keep your urine clear or pale yellow.   Exercise regularly or as directed by your health care provider.   Go to the restroom when you have the urge to go. Do not hold it.   Only take over-the-counter or prescription medicines as directed by your health care provider. Do not take other medicines for constipation without talking to your health care provider first.  [place] IF:   You have  bright red blood in your stool.   Your constipation lasts for more than 4 days or gets worse.   You have abdominal or rectal pain.   You have thin, pencil-like stools.   You have unexplained weight loss. MAKE SURE YOU:   Understand these instructions.  Will watch your condition.  Will get help right away if you are not doing well or get worse. Document Released: 02/20/2004 Document Revised: 05/29/2013 Document Reviewed: 03/05/2013 [facility] Patient Information 2015 [place], [place]. This information is not intended to replace advice given to you by your health care provider. Make sure you discuss any questions you have with your health care provider.

## 2014-05-22 ENCOUNTER — Encounter (HOSPITAL_COMMUNITY): Payer: Self-pay | Admitting: Emergency Medicine

## 2014-05-22 ENCOUNTER — Emergency Department (HOSPITAL_COMMUNITY)
Admission: EM | Admit: 2014-05-22 | Discharge: 2014-05-22 | Disposition: A | Discharge status: Home / Self Care | Payer: Medicare Other | Attending: Emergency Medicine | Admitting: Emergency Medicine

## 2014-05-22 DIAGNOSIS — Z9981 Dependence on supplemental oxygen: Secondary | ICD-10-CM | POA: Insufficient documentation

## 2014-05-22 DIAGNOSIS — Z8639 Personal history of other endocrine, nutritional and metabolic disease: Secondary | ICD-10-CM | POA: Insufficient documentation

## 2014-05-22 DIAGNOSIS — E876 Hypokalemia: Secondary | ICD-10-CM | POA: Diagnosis not present

## 2014-05-22 DIAGNOSIS — Z9049 Acquired absence of other specified parts of digestive tract: Secondary | ICD-10-CM | POA: Insufficient documentation

## 2014-05-22 DIAGNOSIS — Z8739 Personal history of other diseases of the musculoskeletal system and connective tissue: Secondary | ICD-10-CM | POA: Insufficient documentation

## 2014-05-22 DIAGNOSIS — K625 Hemorrhage of anus and rectum: Secondary | ICD-10-CM

## 2014-05-22 DIAGNOSIS — Z90710 Acquired absence of both cervix and uterus: Secondary | ICD-10-CM | POA: Insufficient documentation

## 2014-05-22 DIAGNOSIS — I10 Essential (primary) hypertension: Secondary | ICD-10-CM | POA: Diagnosis not present

## 2014-05-22 DIAGNOSIS — N644 Mastodynia: Secondary | ICD-10-CM | POA: Insufficient documentation

## 2014-05-22 DIAGNOSIS — K921 Melena: Secondary | ICD-10-CM | POA: Diagnosis not present

## 2014-05-22 DIAGNOSIS — Z87448 Personal history of other diseases of urinary system: Secondary | ICD-10-CM | POA: Insufficient documentation

## 2014-05-22 DIAGNOSIS — F0390 Unspecified dementia without behavioral disturbance: Secondary | ICD-10-CM | POA: Diagnosis not present

## 2014-05-22 DIAGNOSIS — Z880 Allergy status to penicillin: Secondary | ICD-10-CM | POA: Insufficient documentation

## 2014-05-22 DIAGNOSIS — Z79899 Other long term (current) drug therapy: Secondary | ICD-10-CM | POA: Diagnosis not present

## 2014-05-22 DIAGNOSIS — G4730 Sleep apnea, unspecified: Secondary | ICD-10-CM | POA: Diagnosis not present

## 2014-05-22 DIAGNOSIS — K649 Unspecified hemorrhoids: Secondary | ICD-10-CM

## 2014-05-22 LAB — COMPREHENSIVE METABOLIC PANEL
ALT: 15 U/L (ref 0–35)
ANION GAP: 13 (ref 5–15)
AST: 21 U/L (ref 0–37)
Albumin: 3.7 g/dL (ref 3.5–5.2)
Alkaline Phosphatase: 80 U/L (ref 39–117)
BUN: 14 mg/dL (ref 6–23)
CALCIUM: 9.4 mg/dL (ref 8.4–10.5)
CO2: 27 mEq/L (ref 19–32)
Chloride: 98 mEq/L (ref 96–112)
Creatinine, Ser: 0.74 mg/dL (ref 0.50–1.10)
GFR calc non Af Amer: 77 mL/min — ABNORMAL LOW (ref >90)
GFR, EST AFRICAN AMERICAN: 89 mL/min — AB (ref >90)
GLUCOSE: 114 mg/dL — AB (ref 70–99)
Potassium: 3.4 mEq/L — ABNORMAL LOW (ref 3.7–5.3)
SODIUM: 138 meq/L (ref 137–147)
Total Bilirubin: 0.3 mg/dL (ref 0.3–1.2)
Total Protein: 7.3 g/dL (ref 6.0–8.3)

## 2014-05-22 LAB — CBC
HEMATOCRIT: 43.9 % (ref 36.0–46.0)
HEMOGLOBIN: 14.8 g/dL (ref 12.0–15.0)
MCH: 31.4 pg (ref 26.0–34.0)
MCHC: 33.7 g/dL (ref 30.0–36.0)
MCV: 93.2 fL (ref 78.0–100.0)
Platelets: 198 10*3/uL (ref 150–400)
RBC: 4.71 MIL/uL (ref 3.87–5.11)
RDW: 12.5 % (ref 11.5–15.5)
WBC: 6.5 10*3/uL (ref 4.0–10.5)

## 2014-05-22 LAB — POC OCCULT BLOOD, ED: Fecal Occult Bld: NEGATIVE

## 2014-05-22 MED ORDER — HYDROCORTISONE ACETATE 25 MG RE SUPP: 25.0000 mg | Freq: Two times a day (BID) | RECTAL | Status: DC

## 2014-05-22 MED ORDER — POLYETHYLENE GLYCOL 3350 17 G PO PACK: 17.0000 g | PACK | Freq: Every day | ORAL | Status: DC | PRN

## 2014-05-22 NOTE — ED Notes (Signed)
Pt would not allow tech to get d/c vitals

## 2014-05-22 NOTE — Discharge Instructions (Signed)
Use anusol as directed for your hemorrhoids. Use miralax daily to have soft daily bowel movements, then you may decrease to every other day or weekly to have continued soft bowel movements. See the gastroenterologist listed above for ongoing management of your hemorrhoids. Perform warm sitz baths daily to help with the pain. Follow up with your regular doctor for ongoing management of your toe pain and breast pain. Return to the ER for changes or worsening symptoms.   Hemorrhoids Hemorrhoids are puffy (swollen) veins around the rectum or anus. Hemorrhoids can cause pain, itching, bleeding, or irritation. HOME CARE  Eat foods with fiber, such as whole grains, beans, nuts, fruits, and vegetables. Ask your doctor about taking products with added fiber in them (fibersupplements).  Drink enough fluid to keep your pee (urine) clear or pale yellow.  Exercise often.  Go to the bathroom when you have the urge to poop. Do not wait.  Avoid straining to poop (bowel movement).  Keep the butt area dry and clean. Use wet toilet paper or moist paper towels.  Medicated creams and medicine inserted into the anus (anal suppository) may be used or applied as told.  Only take medicine as told by your doctor.  Take a warm water bath (sitz bath) for 15-20 minutes to ease pain. Do this 3-4 times a day.  Place ice packs on the area if it is tender or puffy. Use the ice packs between the warm water baths.  Put ice in a plastic bag.  Place a towel between your skin and the bag.  Leave the ice on for 15-20 minutes, 03-04 times a day.  Do not use a donut-shaped pillow or sit on the toilet for a long time. GET HELP RIGHT AWAY IF:   You have more pain that is not controlled by treatment or medicine.  You have bleeding that will not stop.  You have trouble or are unable to poop (bowel movement).  You have pain or puffiness outside the area of the hemorrhoids. MAKE SURE YOU:   Understand these  instructions.  Will watch your condition.  Will get help right away if you are not doing well or get worse. Document Released: 03/02/2008 Document Revised: 05/10/2012 Document Reviewed: 04/04/2012 [facility] Patient Information 2015 [place], [place]. This information is not intended to replace advice given to you by your health care provider. Make sure you discuss any questions you have with your health care provider.  Rectal Bleeding  Rectal bleeding is when blood comes out of the opening of the butt (anus). Rectal bleeding may show up as bright red blood or really dark poop (stool). The poop may look dark red, maroon, or black. Rectal bleeding is often a sign that something is wrong. This needs to be checked by a doctor.  HOME CARE  Eat a diet high in fiber. This will help keep your poop soft.  Limit activity.  Drink enough fluids to keep your pee (urine) clear or pale yellow.  Take a warm bath to soothe any pain.  Follow up with your doctor as told. GET HELP RIGHT AWAY IF:  You have more bleeding.  You have black or dark red poop.  You throw up (vomit) blood or it looks like coffee grounds.  You have belly (abdominal) pain or tenderness.  You have a fever.  You feel weak, sick to your stomach (nauseous), or you pass out (faint).  You have pain that is so bad you cannot poop (bowel movement). MAKE SURE YOU:  Understand these instructions.  Will watch your condition.  Will get help right away if you are not doing well or get worse. Document Released: 02/03/2011 Document Revised: 10/08/2013 Document Reviewed: 02/03/2011 [facility] Patient Information 2015 [place], [place]. This information is not intended to replace advice given to you by your health care provider. Make sure you discuss any questions you have with your health care provider.  Disposable Sitz Bath A disposable sitz bath is a plastic basin that fits over the toilet. A bag is hung above the toilet and is  connected to a tube that opens into the disposable sitz bath. The bag is filled with warm water that can flow into the basin through the tube.  HOW TO USE A DISPOSABLE SITZ BATH  Close the clamp on the tubing before filling the bag with water. This is to prevent leakage.  Fill the sitz bath basin and the plastic bag with warm water.  Place the filled basin on the toilet with the seat raised. Make sure the overflow opening is facing toward the back of the toilet.  Hang the filled plastic bag overhead on a hook or towel rack close to the toilet. When the bag is unclamped, a steady stream of water will flow from the bag, through the tubing, and into the basin.  Attach the tubing to the opening on the basin.  Sit on the basin positioned on the toilet seat and release the clamp. This will allow warm water to flush the area around your genitals and anus (perineum).  Remain sitting on the basin for approximately 15 to 20 minutes.  Stand up and pat the perineum area dry. If needed, apply clean bandages (dressings) to the affected area.  Tip the basin into the toilet to remove any remaining water and flush the toilet.  Wash the basin with warm water and soap. Let it dry in the sink.  Store the basin and tubing in a clean, dry area.  Wash your hands with soap and water. SEEK MEDICAL CARE IF: You get worse instead of better. Stop the sitz baths if you get worse. MAKE SURE YOU:  Understand these instructions.  Will watch your condition.  Will get help right away if you are not doing well or get worse. Document Released: 11/23/2011 Document Revised: 02/16/2012 Document Reviewed: 11/23/2011 [facility] Patient Information 2015 [place], [place]. This information is not intended to replace advice given to you by your health care provider. Make sure you discuss any questions you have with your health care provider.  Breast Tenderness Breast tenderness is a common problem for women of all ages.  Breast tenderness may cause mild discomfort to severe pain. It has a variety of causes. Your health care provider will find out the likely cause of your breast tenderness by examining your breasts, asking you about symptoms, and ordering some tests. Breast tenderness usually does not mean you have breast cancer. HOME CARE INSTRUCTIONS  Breast tenderness often can be handled at home. You can try:  Getting fitted for a new bra that provides more support, especially during exercise.  Wearing a more supportive bra or sports bra while sleeping when your breasts are very tender.  If you have a breast injury, apply ice to the area:  Put ice in a plastic bag.  Place a towel between your skin and the bag.  Leave the ice on for 20 minutes, 2-3 times a day.  If your breasts are too full of milk as a result of breastfeeding,  try:  Expressing milk either by hand or with a breast pump.  Applying a warm compress to the breasts for relief.  Taking over-the-counter pain relievers, if approved by your health care provider.  Taking other medicines that your health care provider prescribes. These may include antibiotic medicines or birth control pills. Over the long term, your breast tenderness might be eased if you:  Cut down on caffeine.  Reduce the amount of fat in your diet. Keep a log of the days and times when your breasts are most tender. This will help you and your health care provider find the cause of the tenderness and how to relieve it. Also, learn how to do breast exams at home. This will help you notice if you have an unusual growth or lump that could cause tenderness. SEEK MEDICAL CARE IF:   Any part of your breast is hard, red, and hot to the touch. This could be a sign of infection.  Fluid is coming out of your nipples (and you are not breastfeeding). Especially watch for blood or pus.  You have a fever as well as breast tenderness.  You have a new or painful lump in your breast  that remains after your menstrual period ends.  You have tried to take care of the pain at home, but it has not gone away.  Your breast pain is getting worse, or the pain is making it hard to do the things you usually do during your day. Document Released: 05/06/2008 Document Revised: 01/24/2013 Document Reviewed: 12/21/2012 [facility] Patient Information 2015 [place], [place]. This information is not intended to replace advice given to you by your health care provider. Make sure you discuss any questions you have with your health care provider.

## 2014-05-22 NOTE — ED Notes (Signed)
Per pt, states rectal bleeding for a few days, states also complaining of breast pain and an ingrown toe nail

## 2014-05-22 NOTE — ED Provider Notes (Signed)
CSN: [id number]     Arrival date & time 05/22/14  4944 History   First MD Initiated Contact with Patient 05/22/14 1102     Chief Complaint  Patient presents with  . Rectal Bleeding     (Consider location/radiation/quality/duration/timing/severity/associated sxs/prior Treatment) HPI Comments: Kimberly Acevedo is a 80 y.o. female with a PMHx of fibromyalgia, HLD, achalasia, chronic dizziness/lightheadedness with standing, HTN, hemorrhoids, arthritis, diverticulitis, GERD, sleep apnea, and L sided breast CA in 2014 s/p radiation, with a PSHx of rectocele, hysterectomy, esophageal dilation, cholecystectomy, L breast lympectomy, and varicose vein surgery of LLE, who presents to the ED with complaints of BRBPR x2-3 days. Patient states that this is similar to her prior rectal bleeding episodes. She reports that it is bright red and scant, stating that there are a few droplets on the toilet and on the paper when she wipes, and that the bleeding has now resolved. She reports that she has been using rectal suppositories for hemorrhoids which has helped with the rectal pain that she experiences with bowel movements. She reports that the pain feels like pressure before the bowel movement, but denies any sharp pain during bowel movement. She endorses associated abdominal soreness stating that it is 2/10 intermittent in her lower abdominal area nonradiating and increasing with bowel movements/straining, resolving with time and having a bowel movement. She states that this pain is intermittently present and has been ongoing since before the bleeding started. She notes that intermittently becomes fully distended, but today she is not experiencing this symptom. She has not seen a surgeon or her PCP for her hemorrhoids, her last colonoscopy was in 2007. States she alternates between loose and hard stools. Denies fevers, chills, reflux, lightheadedness, syncope, weakness, numbness, paresthesias, CP, SOB, N/V/D/C, melena,  obstipation, hematuria, dysuria, myalgias, arthralgias, back/flank pain, vaginal discharge/bleeding, or rashes. Denies EtOH/NSAID use, or anticoagulant use.   Additionally she states that she has had some left breast pain near the location of her prior lumpectomy, and a right ingrown toenail which she is already cut and denies that this is causing her any discomfort. Denies redness, swelling, or drainage. Denies breast erythema/rash, nipple discharge/inversion, or lumps.   Patient is a 80 y.o. female presenting with hematochezia. The history is provided by the patient. No language interpreter was used.  Rectal Bleeding Quality:  Bright red Amount:  Scant Duration:  3 days Timing:  Intermittent Progression:  Resolved Chronicity:  Chronic Context: hemorrhoids   Similar prior episodes: yes   Relieved by:  Hemorrhoid cream and time Worsened by:  Defecation and wiping Ineffective treatments:  None tried Associated symptoms: abdominal pain (soreness, occurs with BMs and resolves when she's not straining) and light-headedness (chronic ongoing issue, unchanged)   Associated symptoms: no dizziness, no epistaxis, no fever, no hematemesis, no loss of consciousness and no vomiting   Risk factors: no anticoagulant use     Past Medical History  Diagnosis Date  . Fibromyalgia   . Dyslipidemia   . History of chest pain   . Achalasia, esophageal     history of  . History of dizziness   . History of fatigue   . Hypertension   . Hemorrhoids   . Arthritis   . Diverticulitis   . Kidney problem     spot on kidney, pt  says being elevated in April 2013  . Acid reflux   . Sleep apnea     uses a cpap  . Radiation 08/10/12-09/08/2012    Left breast 750 cGy  in 3 sessions  . Dementia    Past Surgical History  Procedure Laterality Date  . Rectocele repair    . Total abdominal hysterectomy    . Esophageal dilation    . Cholecystectomy  2007  . Colonoscopy    . Breast lumpectomy with needle  localization  07/04/2012    Procedure: BREAST LUMPECTOMY WITH NEEDLE LOCALIZATION;  Surgeon: Edward Jolly, MD;  Location: [facility];  Service: General;  Laterality: Left;  left  . Varicose vein surgery  2010    left lower extremity   Family History  Problem Relation Age of Onset  . Cancer Mother 84    melanoma ca  . Arthritis Father   . Dementia Neg Hx    History  Substance Use Topics  . Smoking status: Never Smoker   . Smokeless tobacco: Never Used  . Alcohol Use: No   OB History    No data available     Review of Systems  Constitutional: Negative for fever and chills.  HENT: Negative for nosebleeds.   Eyes: Negative for visual disturbance.  Respiratory: Negative for shortness of breath.   Cardiovascular: Negative for chest pain.  Gastrointestinal: Positive for abdominal pain (soreness, occurs with BMs and resolves when she's not straining), blood in stool (hematochezia), hematochezia, abdominal distention (intermittently, not currently ongoing), anal bleeding and rectal pain. Negative for nausea, vomiting, diarrhea, constipation and hematemesis.  Genitourinary: Negative for dysuria, urgency, frequency, hematuria, vaginal bleeding, vaginal discharge and difficulty urinating.  Musculoskeletal: Negative for myalgias, back pain and arthralgias.  Skin: Negative for color change.  Neurological: Positive for light-headedness (chronic ongoing issue, unchanged). Negative for dizziness, loss of consciousness, syncope, weakness, numbness and headaches.  Hematological: Does not bruise/bleed easily.  Psychiatric/Behavioral: Negative for confusion.   10 Systems reviewed and are negative for acute change except as noted in the HPI.    Allergies  Penicillins; Statins; Sulfa drugs cross reactors; Tramadol hcl; Aspirin; and Codeine  Home Medications   Prior to Admission medications   Medication Sig Start Date End Date Taking? Authorizing Provider  acetaminophen  (TYLENOL) 500 MG tablet Take 500 mg by mouth every 6 (six) hours as needed. For arthritis    Historical Provider, MD  Cholecalciferol (VITAMIN D-3) 1000 UNITS CAPS Take 1 capsule by mouth daily.    Historical Provider, MD  DILT-XR 240 MG 24 hr capsule  01/08/14   Historical Provider, MD  diltiazem (CARDIZEM CD) 240 MG 24 hr capsule  01/08/14   Historical Provider, MD  donepezil (ARICEPT) 10 MG tablet  12/19/13   Historical Provider, MD  losartan-hydrochlorothiazide (HYZAAR) 100-25 MG per tablet  01/11/14   Historical Provider, MD  NAMENDA XR 21 MG CP24  02/19/14   Historical Provider, MD  PROCTOSOL HC 2.5 % rectal cream  01/31/14   Historical Provider, MD  sertraline (ZOLOFT) 100 MG tablet Take 100 mg by mouth at bedtime.     Historical Provider, MD  valsartan-hydrochlorothiazide (DIOVAN-HCT) 320-25 MG per tablet Take 1 tablet by mouth daily.    Historical Provider, MD   BP 140/81 mmHg  Pulse 76  Temp(Src) 97.5 F (36.4 C) (Oral)  Resp 20  SpO2 97% Physical Exam  Constitutional: She is oriented to person, place, and time. Vital signs are normal. She appears well-developed and well-nourished.  Non-toxic appearance. No distress.  Afebrile, nontoxic, NAD, WDWN  HENT:  Head: Normocephalic and atraumatic.  Nose: Nose normal.  Mouth/Throat: Oropharynx is clear and moist and mucous membranes are normal.  Eyes: Conjunctivae and EOM are normal. Right eye exhibits no discharge. Left eye exhibits no discharge.  Neck: Normal range of motion. Neck supple.  Cardiovascular: Normal rate, regular rhythm, normal heart sounds and intact distal pulses.  Exam reveals no gallop and no friction rub.   No murmur heard. Pulmonary/Chest: Effort normal and breath sounds normal. No respiratory distress. She has no decreased breath sounds. She has no wheezes. She has no rhonchi. She has no rales. Right breast exhibits no inverted nipple, no mass, no nipple discharge, no skin change and no tenderness. Left breast exhibits  tenderness. Left breast exhibits no inverted nipple, no mass, no nipple discharge and no skin change.    L breast TTP along lateral lower quadrant, no masses or skin changes, no nipple retraction or discharge, no axillary LAD  Abdominal: Soft. Normal appearance and bowel sounds are normal. She exhibits no distension. There is generalized tenderness. There is no rigidity, no rebound, no guarding, no CVA tenderness, no tenderness at McBurney's point and negative Murphy's sign.  Soft, ND, +BS throughout, with mild diffuse tenderness to palpation with no r/g/r, neg murphy's, neg mcburney's, no CVA TTP. Pt states the tenderness is more of a "soreness"  Genitourinary: Rectal exam shows external hemorrhoid, internal hemorrhoid and tenderness. Rectal exam shows no fissure, no mass and anal tone normal. Guaiac negative stool.  No gross blood noted during rectal exam, small amount of brown stool noted to anal opening, normal tone, mild tenderness to palpation of old external hemorrhoidal skin tags with one slightly engorged hemorrhoid which is easily reducible, no active bleeding, internal hemorrhoids palpable, no mass or fissure. FOBT neg  Musculoskeletal: Normal range of motion.  R great toe with hypertrophic yellowed nail, no ingrown nail edge or paronychia, no erythema or warmth, no drainage or TTP. FROM intact in all digits. Distal pulses intact. Strength and sensation intact.  Neurological: She is alert and oriented to person, place, and time. She has normal strength. No sensory deficit.  Skin: Skin is warm, dry and intact. No rash noted.  Psychiatric: She has a normal mood and affect.  Nursing note and vitals reviewed.   ED Course  Procedures (including critical care time) Labs Review Labs Reviewed  COMPREHENSIVE METABOLIC PANEL - Abnormal; Notable for the following:    Potassium 3.4 (*)    Glucose, Bld 114 (*)    GFR calc non Af Amer 77 (*)    GFR calc Af Amer 89 (*)    All other components  within normal limits  CBC  POC OCCULT BLOOD, ED    Imaging Review No results found.   EKG Interpretation None      MDM   Final diagnoses:  Rectal bleeding  Hypokalemia  Breast pain, left  Hemorrhoids, unspecified hemorrhoid type  Hematochezia    80 y.o. female with ongoing rectal bleeding. CBC unremarkable, VSS, CMP showing mildly low K no need to replete. FOBT neg. Multiple external and internal hemorrhoids. No active bleeding. No fissures noted, although could be obscured by hemorrhoids. seen here on 11/20 for same, no change in symptoms. Discussed use of anusol and f/up with colorectal surgeon for ongoing management of her hemorrhoids. Discussed miralax to help soften stools. Abd exam with mild diffuse tenderness but nonperitoneal and not concerning, doubt need for CT imaging. Discussed f/up with her breast specialist for her breast pain, no obvious signs of infection or concerning lumps/findings on exam. R toenail not ingrown, no further treatment needed. Discussed f/up with PCP for  her chronic conditions including her toenail which may have fungal infection. I explained the diagnosis and have given explicit precautions to return to the ER including for any other new or worsening symptoms. The patient understands and accepts the medical plan as it's been dictated and I have answered their questions. Discharge instructions concerning home care and prescriptions have been given. The patient is STABLE and is discharged to home in good condition.  BP 140/81 mmHg  Pulse 76  Temp(Src) 97.5 F (36.4 C) (Oral)  Resp 20  SpO2 97%  Meds ordered this encounter  Medications  . hydrocortisone (ANUSOL-HC) 25 MG suppository    Sig: Place 1 suppository (25 mg total) rectally 2 (two) times daily. For 7 days    Dispense:  14 suppository    Refill:  0    Order Specific Question:  Supervising Provider    Answer:  Noemi Chapel D [[id number]]  . polyethylene glycol (MIRALAX / GLYCOLAX) packet     Sig: Take 17 g by mouth daily as needed for mild constipation or moderate constipation. Use as directed until your bowel movements are soft and coming daily, then you may decrease to every other day to continue having daily soft BMs    Dispense:  14 each    Refill:  0    Order Specific Question:  Supervising Provider    Answer:  Noemi Chapel D [place], PA-C 05/22/14 [place] Alvino Chapel, MD 05/22/14 [phone]

## 2014-05-22 NOTE — ED Notes (Signed)
PA at bedside.

## 2014-05-24 ENCOUNTER — Emergency Department (HOSPITAL_COMMUNITY): Payer: Medicare Other

## 2014-05-24 ENCOUNTER — Encounter (HOSPITAL_COMMUNITY): Payer: Self-pay | Admitting: *Deleted

## 2014-05-24 ENCOUNTER — Inpatient Hospital Stay (HOSPITAL_COMMUNITY)
Admission: EM | Admit: 2014-05-24 | Discharge: 2014-05-27 | DRG: 391 | Disposition: A | Discharge status: Home Health | Payer: Medicare Other | Attending: Internal Medicine | Admitting: Internal Medicine

## 2014-05-24 DIAGNOSIS — G4730 Sleep apnea, unspecified: Secondary | ICD-10-CM | POA: Diagnosis present

## 2014-05-24 DIAGNOSIS — Z9049 Acquired absence of other specified parts of digestive tract: Secondary | ICD-10-CM | POA: Diagnosis present

## 2014-05-24 DIAGNOSIS — I10 Essential (primary) hypertension: Secondary | ICD-10-CM | POA: Diagnosis present

## 2014-05-24 DIAGNOSIS — R1084 Generalized abdominal pain: Secondary | ICD-10-CM

## 2014-05-24 DIAGNOSIS — N644 Mastodynia: Secondary | ICD-10-CM | POA: Diagnosis present

## 2014-05-24 DIAGNOSIS — F329 Major depressive disorder, single episode, unspecified: Secondary | ICD-10-CM | POA: Diagnosis present

## 2014-05-24 DIAGNOSIS — F0391 Unspecified dementia with behavioral disturbance: Secondary | ICD-10-CM | POA: Diagnosis present

## 2014-05-24 DIAGNOSIS — K859 Acute pancreatitis without necrosis or infection, unspecified: Secondary | ICD-10-CM

## 2014-05-24 DIAGNOSIS — Z880 Allergy status to penicillin: Secondary | ICD-10-CM

## 2014-05-24 DIAGNOSIS — Z888 Allergy status to other drugs, medicaments and biological substances status: Secondary | ICD-10-CM

## 2014-05-24 DIAGNOSIS — Z885 Allergy status to narcotic agent status: Secondary | ICD-10-CM | POA: Diagnosis not present

## 2014-05-24 DIAGNOSIS — Z882 Allergy status to sulfonamides status: Secondary | ICD-10-CM

## 2014-05-24 DIAGNOSIS — F32A Depression, unspecified: Secondary | ICD-10-CM | POA: Diagnosis present

## 2014-05-24 DIAGNOSIS — D72829 Elevated white blood cell count, unspecified: Secondary | ICD-10-CM | POA: Diagnosis present

## 2014-05-24 DIAGNOSIS — Z90710 Acquired absence of both cervix and uterus: Secondary | ICD-10-CM | POA: Diagnosis not present

## 2014-05-24 DIAGNOSIS — Z886 Allergy status to analgesic agent status: Secondary | ICD-10-CM

## 2014-05-24 DIAGNOSIS — R748 Abnormal levels of other serum enzymes: Secondary | ICD-10-CM | POA: Diagnosis present

## 2014-05-24 DIAGNOSIS — G8929 Other chronic pain: Secondary | ICD-10-CM | POA: Diagnosis present

## 2014-05-24 DIAGNOSIS — Z79899 Other long term (current) drug therapy: Secondary | ICD-10-CM | POA: Diagnosis not present

## 2014-05-24 DIAGNOSIS — M797 Fibromyalgia: Secondary | ICD-10-CM | POA: Diagnosis present

## 2014-05-24 DIAGNOSIS — K219 Gastro-esophageal reflux disease without esophagitis: Secondary | ICD-10-CM

## 2014-05-24 DIAGNOSIS — K5712 Diverticulitis of small intestine without perforation or abscess without bleeding: Secondary | ICD-10-CM

## 2014-05-24 DIAGNOSIS — F03918 Unspecified dementia, unspecified severity, with other behavioral disturbance: Secondary | ICD-10-CM | POA: Diagnosis present

## 2014-05-24 DIAGNOSIS — K5732 Diverticulitis of large intestine without perforation or abscess without bleeding: Secondary | ICD-10-CM | POA: Diagnosis not present

## 2014-05-24 DIAGNOSIS — E876 Hypokalemia: Secondary | ICD-10-CM | POA: Diagnosis present

## 2014-05-24 DIAGNOSIS — Z808 Family history of malignant neoplasm of other organs or systems: Secondary | ICD-10-CM | POA: Diagnosis not present

## 2014-05-24 DIAGNOSIS — E860 Dehydration: Secondary | ICD-10-CM | POA: Diagnosis present

## 2014-05-24 DIAGNOSIS — K210 Gastro-esophageal reflux disease with esophagitis: Secondary | ICD-10-CM

## 2014-05-24 DIAGNOSIS — Z923 Personal history of irradiation: Secondary | ICD-10-CM | POA: Diagnosis not present

## 2014-05-24 DIAGNOSIS — K858 Other acute pancreatitis: Secondary | ICD-10-CM

## 2014-05-24 DIAGNOSIS — K5792 Diverticulitis of intestine, part unspecified, without perforation or abscess without bleeding: Secondary | ICD-10-CM | POA: Diagnosis present

## 2014-05-24 DIAGNOSIS — R109 Unspecified abdominal pain: Secondary | ICD-10-CM

## 2014-05-24 DIAGNOSIS — Z853 Personal history of malignant neoplasm of breast: Secondary | ICD-10-CM | POA: Diagnosis not present

## 2014-05-24 DIAGNOSIS — M1990 Unspecified osteoarthritis, unspecified site: Secondary | ICD-10-CM | POA: Diagnosis present

## 2014-05-24 DIAGNOSIS — E785 Hyperlipidemia, unspecified: Secondary | ICD-10-CM | POA: Diagnosis present

## 2014-05-24 LAB — CBC WITH DIFFERENTIAL/PLATELET
BASOS ABS: 0 10*3/uL (ref 0.0–0.1)
Basophils Relative: 0 % (ref 0–1)
EOS PCT: 0 % (ref 0–5)
Eosinophils Absolute: 0 10*3/uL (ref 0.0–0.7)
HCT: 47.1 % — ABNORMAL HIGH (ref 36.0–46.0)
Hemoglobin: 15.8 g/dL — ABNORMAL HIGH (ref 12.0–15.0)
LYMPHS ABS: 1 10*3/uL (ref 0.7–4.0)
LYMPHS PCT: 8 % — AB (ref 12–46)
MCH: 31.3 pg (ref 26.0–34.0)
MCHC: 33.5 g/dL (ref 30.0–36.0)
MCV: 93.5 fL (ref 78.0–100.0)
Monocytes Absolute: 0.5 10*3/uL (ref 0.1–1.0)
Monocytes Relative: 4 % (ref 3–12)
NEUTROS PCT: 88 % — AB (ref 43–77)
Neutro Abs: 11.2 10*3/uL — ABNORMAL HIGH (ref 1.7–7.7)
PLATELETS: 196 10*3/uL (ref 150–400)
RBC: 5.04 MIL/uL (ref 3.87–5.11)
RDW: 12.7 % (ref 11.5–15.5)
WBC: 12.8 10*3/uL — AB (ref 4.0–10.5)

## 2014-05-24 LAB — COMPREHENSIVE METABOLIC PANEL
ALK PHOS: 65 U/L (ref 39–117)
ALT: 16 U/L (ref 0–35)
AST: 26 U/L (ref 0–37)
Albumin: 3.8 g/dL (ref 3.5–5.2)
Anion gap: 17 — ABNORMAL HIGH (ref 5–15)
BUN: 16 mg/dL (ref 6–23)
CALCIUM: 9.4 mg/dL (ref 8.4–10.5)
CO2: 24 mEq/L (ref 19–32)
Chloride: 92 mEq/L — ABNORMAL LOW (ref 96–112)
Creatinine, Ser: 0.73 mg/dL (ref 0.50–1.10)
GFR calc Af Amer: 89 mL/min — ABNORMAL LOW (ref >90)
GFR, EST NON AFRICAN AMERICAN: 77 mL/min — AB (ref >90)
GLUCOSE: 108 mg/dL — AB (ref 70–99)
POTASSIUM: 3.5 meq/L — AB (ref 3.7–5.3)
SODIUM: 133 meq/L — AB (ref 137–147)
Total Bilirubin: 0.5 mg/dL (ref 0.3–1.2)
Total Protein: 7.7 g/dL (ref 6.0–8.3)

## 2014-05-24 LAB — URINALYSIS, ROUTINE W REFLEX MICROSCOPIC
Bilirubin Urine: NEGATIVE
GLUCOSE, UA: NEGATIVE mg/dL
HGB URINE DIPSTICK: NEGATIVE
Ketones, ur: NEGATIVE mg/dL
Nitrite: NEGATIVE
Protein, ur: NEGATIVE mg/dL
Specific Gravity, Urine: >1.046 — ABNORMAL HIGH (ref 1.005–1.030)
Urobilinogen, UA: 0.2 mg/dL (ref 0.0–1.0)
pH: 6 (ref 5.0–8.0)

## 2014-05-24 LAB — LIPASE, BLOOD: Lipase: 73 U/L — ABNORMAL HIGH (ref 11–59)

## 2014-05-24 LAB — URINE MICROSCOPIC-ADD ON

## 2014-05-24 LAB — I-STAT TROPONIN, ED: TROPONIN I, POC: 0 ng/mL (ref 0.00–0.08)

## 2014-05-24 MED ORDER — METRONIDAZOLE IN NACL 5-0.79 MG/ML-% IV SOLN
500.0000 mg | Freq: Once | INTRAVENOUS | Status: AC
  Administered 2014-05-24: 500 mg via INTRAVENOUS
  Filled 2014-05-24: qty 100

## 2014-05-24 MED ORDER — PANTOPRAZOLE SODIUM 40 MG IV SOLR
40.0000 mg | INTRAVENOUS | Status: DC
  Administered 2014-05-24 – 2014-05-26 (×3): 40 mg via INTRAVENOUS
  Filled 2014-05-24 (×4): qty 40

## 2014-05-24 MED ORDER — BOOST / RESOURCE BREEZE PO LIQD
1.0000 | Freq: Three times a day (TID) | ORAL | Status: DC
  Administered 2014-05-25 – 2014-05-27 (×4): 1 via ORAL

## 2014-05-24 MED ORDER — CIPROFLOXACIN IN D5W 400 MG/200ML IV SOLN: 500.0000 mg | Freq: Two times a day (BID) | INTRAVENOUS | Status: DC

## 2014-05-24 MED ORDER — GI COCKTAIL ~~LOC~~
30.0000 mL | Freq: Three times a day (TID) | ORAL | Status: DC | PRN
  Filled 2014-05-24: qty 30

## 2014-05-24 MED ORDER — POTASSIUM CHLORIDE 10 MEQ/100ML IV SOLN
10.0000 meq | INTRAVENOUS | Status: AC
  Administered 2014-05-24 – 2014-05-25 (×4): 10 meq via INTRAVENOUS
  Filled 2014-05-24 (×4): qty 100

## 2014-05-24 MED ORDER — SODIUM CHLORIDE 0.9 % IV SOLN
INTRAVENOUS | Status: DC
  Administered 2014-05-24 – 2014-05-26 (×4): via INTRAVENOUS

## 2014-05-24 MED ORDER — DONEPEZIL HCL 10 MG PO TABS: 10.0000 mg | ORAL_TABLET | Freq: Every day | ORAL | Status: DC

## 2014-05-24 MED ORDER — DILTIAZEM HCL ER COATED BEADS 240 MG PO CP24: 240.0000 mg | ORAL_CAPSULE | Freq: Every day | ORAL | Status: DC

## 2014-05-24 MED ORDER — POLYETHYLENE GLYCOL 3350 17 G PO PACK
17.0000 g | PACK | Freq: Every day | ORAL | Status: DC | PRN
  Filled 2014-05-24: qty 1

## 2014-05-24 MED ORDER — SODIUM CHLORIDE 0.9 % IV SOLN: INTRAVENOUS | Status: DC

## 2014-05-24 MED ORDER — ONDANSETRON HCL 4 MG/2ML IJ SOLN: 4.0000 mg | Freq: Three times a day (TID) | INTRAMUSCULAR | Status: DC | PRN

## 2014-05-24 MED ORDER — IOHEXOL 300 MG/ML  SOLN
100.0000 mL | Freq: Once | INTRAMUSCULAR | Status: AC | PRN
  Administered 2014-05-24: 100 mL via INTRAVENOUS

## 2014-05-24 MED ORDER — CIPROFLOXACIN IN D5W 400 MG/200ML IV SOLN
400.0000 mg | Freq: Two times a day (BID) | INTRAVENOUS | Status: DC
  Administered 2014-05-25 – 2014-05-26 (×3): 400 mg via INTRAVENOUS
  Filled 2014-05-24 (×4): qty 200

## 2014-05-24 MED ORDER — MORPHINE SULFATE 2 MG/ML IJ SOLN: 2.0000 mg | INTRAMUSCULAR | Status: DC | PRN

## 2014-05-24 MED ORDER — VALSARTAN-HYDROCHLOROTHIAZIDE 320-25 MG PO TABS: 1.0000 | ORAL_TABLET | Freq: Every day | ORAL | Status: DC

## 2014-05-24 MED ORDER — ONDANSETRON HCL 4 MG/2ML IJ SOLN
4.0000 mg | Freq: Once | INTRAMUSCULAR | Status: AC
  Administered 2014-05-24: 4 mg via INTRAVENOUS
  Filled 2014-05-24: qty 2

## 2014-05-24 MED ORDER — SENNA 8.6 MG PO TABS: 1.0000 | ORAL_TABLET | Freq: Every day | ORAL | Status: DC | PRN

## 2014-05-24 MED ORDER — METRONIDAZOLE IN NACL 5-0.79 MG/ML-% IV SOLN
500.0000 mg | Freq: Three times a day (TID) | INTRAVENOUS | Status: DC
  Administered 2014-05-25 – 2014-05-26 (×4): 500 mg via INTRAVENOUS
  Filled 2014-05-24 (×6): qty 100

## 2014-05-24 MED ORDER — ONDANSETRON HCL 4 MG/2ML IJ SOLN
4.0000 mg | Freq: Four times a day (QID) | INTRAMUSCULAR | Status: DC | PRN
  Administered 2014-05-25: 4 mg via INTRAVENOUS
  Filled 2014-05-24: qty 2

## 2014-05-24 MED ORDER — HEPARIN SODIUM (PORCINE) 5000 UNIT/ML IJ SOLN
5000.0000 [IU] | Freq: Three times a day (TID) | INTRAMUSCULAR | Status: DC
  Administered 2014-05-24 – 2014-05-25 (×3): 5000 [IU] via SUBCUTANEOUS
  Filled 2014-05-24 (×11): qty 1

## 2014-05-24 MED ORDER — SODIUM CHLORIDE 0.9 % IV SOLN
INTRAVENOUS | Status: DC
  Administered 2014-05-24: 17:00:00 via INTRAVENOUS

## 2014-05-24 MED ORDER — CIPROFLOXACIN IN D5W 400 MG/200ML IV SOLN
400.0000 mg | Freq: Once | INTRAVENOUS | Status: AC
Start: 2014-05-24 — End: 2014-05-24
  Administered 2014-05-24: 400 mg via INTRAVENOUS
  Filled 2014-05-24: qty 200

## 2014-05-24 MED ORDER — SODIUM CHLORIDE 0.9 % IV BOLUS (SEPSIS)
1000.0000 mL | Freq: Once | INTRAVENOUS | Status: AC
  Administered 2014-05-24: 1000 mL via INTRAVENOUS

## 2014-05-24 MED ORDER — ACETAMINOPHEN 325 MG PO TABS
650.0000 mg | ORAL_TABLET | Freq: Once | ORAL | Status: AC
  Administered 2014-05-24: 650 mg via ORAL
  Filled 2014-05-24: qty 2

## 2014-05-24 MED ORDER — ONDANSETRON HCL 4 MG PO TABS: 4.0000 mg | ORAL_TABLET | Freq: Four times a day (QID) | ORAL | Status: DC | PRN

## 2014-05-24 MED ORDER — SERTRALINE HCL 100 MG PO TABS
100.0000 mg | ORAL_TABLET | Freq: Every day | ORAL | Status: DC
  Administered 2014-05-25 – 2014-05-26 (×2): 100 mg via ORAL
  Filled 2014-05-24: qty 1
  Filled 2014-05-24 (×2): qty 2
  Filled 2014-05-24: qty 1

## 2014-05-24 MED ORDER — IOHEXOL 300 MG/ML  SOLN
50.0000 mL | Freq: Once | INTRAMUSCULAR | Status: AC | PRN
  Administered 2014-05-24: 50 mL via ORAL

## 2014-05-24 MED ORDER — MEMANTINE HCL ER 21 MG PO CP24
21.0000 mg | ORAL_CAPSULE | Freq: Every day | ORAL | Status: DC
  Administered 2014-05-25 – 2014-05-27 (×3): 21 mg via ORAL
  Filled 2014-05-24 (×3): qty 1

## 2014-05-24 NOTE — Progress Notes (Signed)
EDCM spoke to patient and her two caretakers at home.  Patient lives at home alone but has 24/7 caretakers at home.  Patient's daughter also lives next door.  Patient's caregivers report patient requires assistance with ADLs.  Patient is noted to be wearing oxygen in the ED, patient does not wear oxygen at home.  Patient confirms her pcp is Dr. Deland Pretty.  Patient does not have a GI specialist.  Patient has a cane and a sit down shower at home.  Patient's caretakers assist patient with her meals and requesting dietician consult for proper diet for diverticulitis.  EDCM placed this request in physician sticky note.  Patient's caretakers report patient may need physical therapy at home.  EDCM provided patient with list of home health agencies in [facility].  Patient and caregivers thankful for services.  No further EDCM needs at this time.

## 2014-05-24 NOTE — H&P (Addendum)
[facility] History and Physical  MARCINA KINNISON FMB:[id number] DOB: [birth year] DOA: 05/24/2014  Referring physician: PA Alecia Lemming PCP: [name], MD   Chief Complaint: Abd pain  HPI: Kimberly Acevedo is a 80 y.o. female  Abd pain. Chronic abd pain. Acutely worsened over the past week. Tylenol w/o benefit. Intermittent diarrhea adn bloody. Some hard and formed stools during this time. 2+ BMs daily during this time. Today started developing nausea and non-bloody, non-bilious emesis. Associated w/ severe esophageal burning sensation.  Pt demented at baseline. Pt history assisted by home caretakers.    Review of Systems:  Constitutional:  No night sweats, Fevers, chills,   HEENT:  No headaches, Difficulty swallowing,Tooth/dental problems,Sore throat,  No sneezing, itching, ear ache, nasal congestion, post nasal drip,  Cardio-vascular:  No chest pain, Orthopnea, PND, swelling in lower extremities, anasarca, dizziness, palpitations  GI:  Per hpi Resp:  No shortness of breath with exertion or at rest. No excess mucus, no productive cough, No non-productive cough, No coughing up of blood.No change in color of mucus.No wheezing.No chest wall deformity  Skin:  no rash or lesions.  GU:  no dysuria, change in color of urine, no urgency or frequency. No flank pain.  Musculoskeletal:  No joint pain or swelling. No decreased range of motion. No back pain.  Psych:  No change in mood or affect. No depression or anxiety. No memory loss.   Past Medical History  Diagnosis Date  . Fibromyalgia   . Dyslipidemia   . History of chest pain   . Achalasia, esophageal     history of  . History of dizziness   . History of fatigue   . Hypertension   . Hemorrhoids   . Arthritis   . Diverticulitis   . Kidney problem     spot on kidney, pt  says being elevated in April 2013  . Acid reflux   . Sleep apnea     uses a cpap  . Radiation 08/10/12-09/08/2012    Left breast 750 cGy  in 3 sessions  . Dementia    Past Surgical History  Procedure Laterality Date  . Rectocele repair    . Total abdominal hysterectomy    . Esophageal dilation    . Cholecystectomy  2007  . Colonoscopy    . Breast lumpectomy with needle localization  07/04/2012    Procedure: BREAST LUMPECTOMY WITH NEEDLE LOCALIZATION;  Surgeon: Edward Jolly, MD;  Location: [place];  Service: General;  Laterality: Left;  left  . Varicose vein surgery  2010    left lower extremity   Social History:  reports that she has never smoked. She has never used smokeless tobacco. She reports that she does not drink alcohol or use illicit drugs.  Allergies  Allergen Reactions  . Penicillins Swelling and Rash  . Statins   . Sulfa Drugs Cross Reactors Shortness Of Breath  . Tramadol Hcl Anaphylaxis  . Aspirin Swelling  . Codeine Other (See Comments)    unknown    Family History  Problem Relation Age of Onset  . Cancer Mother 62    melanoma ca  . Arthritis Father   . Dementia Neg Hx      Prior to Admission medications   Medication Sig Start Date End Date Taking? Authorizing Provider  NAMENDA XR 21 MG CP24  02/19/14  Yes Historical Provider, MD  polyethylene glycol (MIRALAX / GLYCOLAX) packet Take 17 g by mouth daily as needed for mild  constipation or moderate constipation. Use as directed until your bowel movements are soft and coming daily, then you may decrease to every other day to continue having daily soft BMs 05/22/14  Yes Mercedes Strupp Camprubi-Soms, PA-C  PROCTOSOL HC 2.5 % rectal cream  01/31/14  Yes Historical Provider, MD  sertraline (ZOLOFT) 100 MG tablet Take 100 mg by mouth at bedtime.    Yes Historical Provider, MD  acetaminophen (TYLENOL) 500 MG tablet Take 500 mg by mouth every 6 (six) hours as needed. For arthritis    Historical Provider, MD  Cholecalciferol (VITAMIN D-3) 1000 UNITS CAPS Take 1 capsule by mouth daily.    Historical Provider, MD  DILT-XR 240 MG 24 hr  capsule  01/08/14   Historical Provider, MD  diltiazem (CARDIZEM CD) 240 MG 24 hr capsule  01/08/14   Historical Provider, MD  donepezil (ARICEPT) 10 MG tablet  12/19/13   Historical Provider, MD  hydrocortisone (ANUSOL-HC) 25 MG suppository Place 1 suppository (25 mg total) rectally 2 (two) times daily. For 7 days 05/22/14   Patty Sermons Camprubi-Soms, PA-C  losartan-hydrochlorothiazide [facility]) 100-25 MG per tablet  01/11/14   Historical Provider, MD  valsartan-hydrochlorothiazide (DIOVAN-HCT) 320-25 MG per tablet Take 1 tablet by mouth daily.    Historical Provider, MD   Physical Exam: Filed Vitals:   05/24/14 1531 05/24/14 1630 05/24/14 1819 05/24/14 1934  BP: 117/85  113/48 119/52  Pulse: 109  90 87  Temp: 97.9 F (36.6 C) 97.4 F (36.3 C)  98.7 F (37.1 C)  TempSrc: Oral Oral  Oral  Resp: 20  18 16   SpO2: 97%  92% 94%    Wt Readings from Last 3 Encounters:  04/26/14 65.772 kg (145 lb)  02/26/14 71.124 kg (156 lb 12.8 oz)  05/22/13 71.9 kg (158 lb 8.2 oz)    General:  Appears calm and comfortable Eyes:  PERRL, normal lids, irises & conjunctiva ENT: Dry mucus membranes Neck:  no LAD, masses or thyromegaly Cardiovascular:  RRR, III/VI systolic murmur. No LE edema. Telemetry:  SR, no arrhythmias  Respiratory:  CTA bilaterally, no w/r/r. Normal respiratory effort. Abdomen:  soft, ntnd Skin:  no rash or induration seen on limited exam Musculoskeletal:  grossly normal tone BUE/BLE Psychiatric:  grossly normal mood and affect, speech fluent and appropriate Neurologic:  grossly non-focal.          Labs on Admission:  Basic Metabolic Panel:  Recent Labs Lab 05/22/14 1025 05/24/14 1633  NA 138 133*  K 3.4* 3.5*  CL 98 92*  CO2 27 24  GLUCOSE 114* 108*  BUN 14 16  CREATININE 0.74 0.73  CALCIUM 9.4 9.4   Liver Function Tests:  Recent Labs Lab 05/22/14 1025 05/24/14 1633  AST 21 26  ALT 15 16  ALKPHOS 80 65  BILITOT 0.3 0.5  PROT 7.3 7.7  ALBUMIN 3.7 3.8     Recent Labs Lab 05/24/14 1633  LIPASE 73*   No results for input(s): AMMONIA in the last 168 hours. CBC:  Recent Labs Lab 05/22/14 1025 05/24/14 1633  WBC 6.5 12.8*  NEUTROABS  --  11.2*  HGB 14.8 15.8*  HCT 43.9 47.1*  MCV 93.2 93.5  PLT 198 196   Cardiac Enzymes: No results for input(s): CKTOTAL, CKMB, CKMBINDEX, TROPONINI in the last 168 hours.  BNP (last 3 results) No results for input(s): PROBNP in the last 8760 hours. CBG: No results for input(s): GLUCAP in the last 168 hours.  Radiological Exams on Admission: Ct Abdomen  Pelvis W Contrast  05/24/2014   CLINICAL DATA:  Low abdominal pain with rectal bleeding. History of inflammatory bowel syndrome.  EXAM: CT ABDOMEN AND PELVIS WITH CONTRAST  TECHNIQUE: Multidetector CT imaging of the abdomen and pelvis was performed using the standard protocol following bolus administration of intravenous contrast.  CONTRAST:  158mL OMNIPAQUE IOHEXOL 300 MG/ML SOLN, 10mL OMNIPAQUE IOHEXOL 300 MG/ML SOLN  COMPARISON:  08/20/2011  FINDINGS: BODY WALL: Unremarkable.  LOWER CHEST: Mild scarring RIGHT middle lobe. Mild cardiomegaly. Hiatal hernia.  ABDOMEN/PELVIS:  Liver: No focal abnormality. Lateral segment LEFT lobe extends into LEFT upper quadrant.  Biliary: No evidence of biliary obstruction or stone. Cholecystectomy.  Pancreas: Unremarkable.  Spleen: Unremarkable.  Adrenals: Unremarkable.  Kidneys and ureters: No hydronephrosis or stone. Continued enlargement of upper pole medially projecting LEFT renal lesion, 24 x 24 x 22 mm having 31 Hounsfield unit attenuation.  Bladder: Unremarkable.  Reproductive: Unremarkable.  Status post hysterectomy.  Bowel: No obstruction. No appendiceal inflammation. Extensive colonic diverticulosis with bowel wall thickening in the descending and proximal sigmoid segments consistent with diverticulitis. No surrounding abscess or free perforation.  Retroperitoneum: No mass or adenopathy.  Peritoneum: No free  fluid or gas.  Vascular: No acute abnormality.  Atherosclerosis.  OSSEOUS: No acute abnormalities.  IMPRESSION: Diverticulitis of the descending and proximal sigmoid colon with generalized diverticulosis. This appearance is similar to priors.  Progressive enlargement of the medial projecting LEFT upper pole lesion. Further evaluation with outpatient pre and post-contrast abdominal MRI or CT is recommended.   Electronically Signed   By: Rolla Flatten M.D.   On: 05/24/2014 19:03    EKG: Independently reviewed. NSR, R axis deviation, no ACS  Assessment/Plan Principal Problem:   Diverticulitis Active Problems:   Hypertension   Fibromyalgia   Depression   Dementia with behavioral disturbance   Pancreatitis   Leukocytosis   GERD (gastroesophageal reflux disease)  Diverticulitis: CT scan significant for diverticulitis of the descending and proximal sigmoid colon w/ generalized diverticulosis. Heme negative. WBC 12.8. Pt dehydrated and weak. Stool Heme negative x2 - Admit - Continue Cipro/flagyl - 1L NS bolus - NS 19ml/hr - zofran - NPO - Morphine  Pancreatitis: Lipase 73 on admission. Mild case. CT w/o mention of biliary dilitation or stones - pt is cholecystecomy. No ETOH use.  - NPO, fluids and pain management as above.  - Lipid painel  - Lipase in am  GERD: h/o GERD. Cough developed today w/o URI or SOB complaints. Associated w/ throat irritation and cough (started today). Likely secondary to emesis - GI cocktail - star PPI - CXR if develops any respiratory complaints - no sign currently of Aspiration/pneumonitis  HTN: mildly hypotensive on admission likely from medications and dehydration - restart Dilt, Valsartan, HCTZ in the am  Depression:  - continue zoloft  Dementia: at baseline per caretakers. Pt oriented to person and place but not month - Continue h ome Namenda and Aricept.   Hypokalemia: Mild at 3.5 - KCL total of 32mEq - BMET in am   Code Status: FULL DVT  Prophylaxis: Hep Family Communication: Pts home caretakers present  Disposition Plan: Pending improvement   Shayaan Parke, [place] [facility] [email or web address] Password [email or web address]

## 2014-05-24 NOTE — ED Notes (Addendum)
Pt reports severe low abd pain with rectal bleeding today.  Pt reports abd pain has been going on for several days.  Pt also reports mild nausea.  Pt reports L breast pain-states having surgery in her L breast recently.  Pt was seen here Monday, has hx of IBS.

## 2014-05-24 NOTE — ED Notes (Signed)
Patient transported to CT 

## 2014-05-24 NOTE — ED Provider Notes (Signed)
CSN: [id number]     Arrival date & time 05/24/14  1525 History   First MD Initiated Contact with Patient 05/24/14 1612     Chief Complaint  Patient presents with  . Abdominal Pain  . Rectal Bleeding     (Consider location/radiation/quality/duration/timing/severity/associated sxs/prior Treatment) HPI Comments: Patient with history of fibromyalgia, hemorrhoids, diverticulitis, GERD, L sided breast CA in 2014 s/p radiation, with previous hysterectomy, esophageal dilation, cholecystectomy, L breast lumpectomy, and varicose vein surgery -- presents with c/o abdominal pain, N/V/D. Pt was seen in ED two days ago with c/o rectal bleeding, work-up was reassuring and patient was d/c to home with instructions to see GI. She returns today with worsening bilateral abdominal pain but worse on left side, 3-4 episodes of watery diarrhea per day, vomiting 'all day' which started this morning. The blood noted in stool has resolved. No urinary symptoms. She continues to have reproducible L-sided breast pain that is chronic and ongoing. She also c/o gradual onset headache starting last night that is worse today. No anticoagulation. No history of irregular heart beat. No history of blood clots.   Patient is a 80 y.o. female presenting with abdominal pain and hematochezia. The history is provided by the patient and medical records.  Abdominal Pain Associated symptoms: chest pain, diarrhea, hematochezia, nausea and vomiting   Associated symptoms: no cough, no dysuria, no fever, no shortness of breath and no sore throat   Rectal Bleeding Associated symptoms: abdominal pain and vomiting   Associated symptoms: no fever     Past Medical History  Diagnosis Date  . Fibromyalgia   . Dyslipidemia   . History of chest pain   . Achalasia, esophageal     history of  . History of dizziness   . History of fatigue   . Hypertension   . Hemorrhoids   . Arthritis   . Diverticulitis   . Kidney problem     spot on  kidney, pt  says being elevated in April 2013  . Acid reflux   . Sleep apnea     uses a cpap  . Radiation 08/10/12-09/08/2012    Left breast 750 cGy in 3 sessions  . Dementia    Past Surgical History  Procedure Laterality Date  . Rectocele repair    . Total abdominal hysterectomy    . Esophageal dilation    . Cholecystectomy  2007  . Colonoscopy    . Breast lumpectomy with needle localization  07/04/2012    Procedure: BREAST LUMPECTOMY WITH NEEDLE LOCALIZATION;  Surgeon: Edward Jolly, MD;  Location: [place];  Service: General;  Laterality: Left;  left  . Varicose vein surgery  2010    left lower extremity   Family History  Problem Relation Age of Onset  . Cancer Mother 109    melanoma ca  . Arthritis Father   . Dementia Neg Hx    History  Substance Use Topics  . Smoking status: Never Smoker   . Smokeless tobacco: Never Used  . Alcohol Use: No   OB History    No data available     Review of Systems  Constitutional: Negative for fever.  HENT: Negative for rhinorrhea and sore throat.   Eyes: Negative for redness.  Respiratory: Negative for cough and shortness of breath.   Cardiovascular: Positive for chest pain. Negative for leg swelling.  Gastrointestinal: Positive for nausea, vomiting, abdominal pain, diarrhea and hematochezia. Negative for blood in stool.  Genitourinary: Negative for dysuria.  Musculoskeletal: Negative for myalgias.  Skin: Negative for rash.  Neurological: Positive for headaches.    Allergies  Penicillins; Statins; Sulfa drugs cross reactors; Tramadol hcl; Aspirin; and Codeine  Home Medications   Prior to Admission medications   Medication Sig Start Date End Date Taking? Authorizing Provider  acetaminophen (TYLENOL) 500 MG tablet Take 500 mg by mouth every 6 (six) hours as needed. For arthritis    Historical Provider, MD  Cholecalciferol (VITAMIN D-3) 1000 UNITS CAPS Take 1 capsule by mouth daily.    Historical Provider, MD   DILT-XR 240 MG 24 hr capsule  01/08/14   Historical Provider, MD  diltiazem (CARDIZEM CD) 240 MG 24 hr capsule  01/08/14   Historical Provider, MD  donepezil (ARICEPT) 10 MG tablet  12/19/13   Historical Provider, MD  hydrocortisone (ANUSOL-HC) 25 MG suppository Place 1 suppository (25 mg total) rectally 2 (two) times daily. For 7 days 05/22/14   Patty Sermons Camprubi-Soms, PA-C  losartan-hydrochlorothiazide [facility]) 100-25 MG per tablet  01/11/14   Historical Provider, MD  NAMENDA XR 21 MG CP24  02/19/14   Historical Provider, MD  polyethylene glycol (MIRALAX / GLYCOLAX) packet Take 17 g by mouth daily as needed for mild constipation or moderate constipation. Use as directed until your bowel movements are soft and coming daily, then you may decrease to every other day to continue having daily soft BMs 05/22/14   Mercedes Strupp Camprubi-Soms, PA-C  PROCTOSOL HC 2.5 % rectal cream  01/31/14   Historical Provider, MD  sertraline (ZOLOFT) 100 MG tablet Take 100 mg by mouth at bedtime.     Historical Provider, MD  valsartan-hydrochlorothiazide (DIOVAN-HCT) 320-25 MG per tablet Take 1 tablet by mouth daily.    Historical Provider, MD   BP 117/85 mmHg  Pulse 109  Temp(Src) 97.9 F (36.6 C) (Oral)  Resp 20  SpO2 97%   Physical Exam  Constitutional: She appears well-developed and well-nourished.  HENT:  Head: Normocephalic and atraumatic.  Eyes: Conjunctivae are normal. Right eye exhibits no discharge. Left eye exhibits no discharge.  Neck: Normal range of motion. Neck supple.  Cardiovascular: Regular rhythm and normal heart sounds.  Tachycardia present.   No murmur heard. Mild tachycardia 100-105.  Pulmonary/Chest: Effort normal and breath sounds normal. No respiratory distress. She has no wheezes. She has no rales.  Abdominal: Soft. Bowel sounds are normal. She exhibits no distension. There is tenderness (L-sided, no suprapubic). There is no rebound and no guarding.  Musculoskeletal: She exhibits  no edema or tenderness.  Neurological: She is alert.  Skin: Skin is warm and dry.  Psychiatric: She has a normal mood and affect.  Nursing note and vitals reviewed.   ED Course  Procedures (including critical care time) Labs Review Labs Reviewed  CBC WITH DIFFERENTIAL - Abnormal; Notable for the following:    WBC 12.8 (*)    Hemoglobin 15.8 (*)    HCT 47.1 (*)    Neutrophils Relative % 88 (*)    Neutro Abs 11.2 (*)    Lymphocytes Relative 8 (*)    All other components within normal limits  COMPREHENSIVE METABOLIC PANEL - Abnormal; Notable for the following:    Sodium 133 (*)    Potassium 3.5 (*)    Chloride 92 (*)    Glucose, Bld 108 (*)    GFR calc non Af Amer 77 (*)    GFR calc Af Amer 89 (*)    Anion gap 17 (*)    All other components within  normal limits  LIPASE, BLOOD - Abnormal; Notable for the following:    Lipase 73 (*)    All other components within normal limits  URINALYSIS, ROUTINE W REFLEX MICROSCOPIC  I-STAT TROPOININ, ED    Imaging Review Ct Abdomen Pelvis W Contrast  05/24/2014   CLINICAL DATA:  Low abdominal pain with rectal bleeding. History of inflammatory bowel syndrome.  EXAM: CT ABDOMEN AND PELVIS WITH CONTRAST  TECHNIQUE: Multidetector CT imaging of the abdomen and pelvis was performed using the standard protocol following bolus administration of intravenous contrast.  CONTRAST:  163mL OMNIPAQUE IOHEXOL 300 MG/ML SOLN, 28mL OMNIPAQUE IOHEXOL 300 MG/ML SOLN  COMPARISON:  08/20/2011  FINDINGS: BODY WALL: Unremarkable.  LOWER CHEST: Mild scarring RIGHT middle lobe. Mild cardiomegaly. Hiatal hernia.  ABDOMEN/PELVIS:  Liver: No focal abnormality. Lateral segment LEFT lobe extends into LEFT upper quadrant.  Biliary: No evidence of biliary obstruction or stone. Cholecystectomy.  Pancreas: Unremarkable.  Spleen: Unremarkable.  Adrenals: Unremarkable.  Kidneys and ureters: No hydronephrosis or stone. Continued enlargement of upper pole medially projecting LEFT renal  lesion, 24 x 24 x 22 mm having 31 Hounsfield unit attenuation.  Bladder: Unremarkable.  Reproductive: Unremarkable.  Status post hysterectomy.  Bowel: No obstruction. No appendiceal inflammation. Extensive colonic diverticulosis with bowel wall thickening in the descending and proximal sigmoid segments consistent with diverticulitis. No surrounding abscess or free perforation.  Retroperitoneum: No mass or adenopathy.  Peritoneum: No free fluid or gas.  Vascular: No acute abnormality.  Atherosclerosis.  OSSEOUS: No acute abnormalities.  IMPRESSION: Diverticulitis of the descending and proximal sigmoid colon with generalized diverticulosis. This appearance is similar to priors.  Progressive enlargement of the medial projecting LEFT upper pole lesion. Further evaluation with outpatient pre and post-contrast abdominal MRI or CT is recommended.   Electronically Signed   By: Rolla Flatten M.D.   On: 05/24/2014 19:03     EKG Interpretation   Date/Time:  Friday May 24 2014 16:17:37 EST Ventricular Rate:  98 PR Interval:  136 QRS Duration: 109 QT Interval:  351 QTC Calculation: 448 R Axis:   88 Text Interpretation:  Sinus rhythm Borderline right axis deviation No  significant change since last tracing Confirmed by Winfred Leeds  MD, SAM  [phone]) on 05/24/2014 4:28:38 PM      4:29 PM Patient seen and examined. Work-up initiated. Medications ordered.   Vital signs reviewed and are as follows: BP 117/85 mmHg  Pulse 109  Temp(Src) 97.9 F (36.6 C) (Oral)  Resp 20  SpO2 97%   7:32 PM Patient discussed with and seen by Dr. Aline Brochure earlier.   Patient has continued to have some vomiting here but not as frequent as earlier today. Given this and age, feel she will be high probability of not tolerating meds at home and feel she warrants admission to start therapy and treat N/V.  BP 113/48 mmHg  Pulse 90  Temp(Src) 97.4 F (36.3 C) (Oral)  Resp 18  SpO2 92%  7:52 PM Spoke with Dr. Marily Memos who  will see and admit.   MDM   Final diagnoses:  Abdominal pain   Admit for uncomplicated diverticulitis, intractable vomiting.     Carlisle Cater, PA-C 05/24/14 1952  Pamella Pert, MD 05/25/14 [id number]

## 2014-05-25 DIAGNOSIS — K850 Idiopathic acute pancreatitis: Secondary | ICD-10-CM

## 2014-05-25 DIAGNOSIS — R109 Unspecified abdominal pain: Secondary | ICD-10-CM

## 2014-05-25 LAB — COMPREHENSIVE METABOLIC PANEL
ALK PHOS: 48 U/L (ref 39–117)
ALT: 12 U/L (ref 0–35)
AST: 24 U/L (ref 0–37)
Albumin: 2.9 g/dL — ABNORMAL LOW (ref 3.5–5.2)
Anion gap: 12 (ref 5–15)
BILIRUBIN TOTAL: 0.6 mg/dL (ref 0.3–1.2)
BUN: 10 mg/dL (ref 6–23)
CHLORIDE: 101 meq/L (ref 96–112)
CO2: 25 meq/L (ref 19–32)
Calcium: 7.9 mg/dL — ABNORMAL LOW (ref 8.4–10.5)
Creatinine, Ser: 0.81 mg/dL (ref 0.50–1.10)
GFR calc Af Amer: 76 mL/min — ABNORMAL LOW (ref >90)
GFR calc non Af Amer: 65 mL/min — ABNORMAL LOW (ref >90)
Glucose, Bld: 104 mg/dL — ABNORMAL HIGH (ref 70–99)
POTASSIUM: 3.4 meq/L — AB (ref 3.7–5.3)
Sodium: 138 mEq/L (ref 137–147)
Total Protein: 5.7 g/dL — ABNORMAL LOW (ref 6.0–8.3)

## 2014-05-25 LAB — LIPASE, BLOOD: Lipase: 40 U/L (ref 11–59)

## 2014-05-25 LAB — CBC
HEMATOCRIT: 37.9 % (ref 36.0–46.0)
Hemoglobin: 12.5 g/dL (ref 12.0–15.0)
MCH: 31.3 pg (ref 26.0–34.0)
MCHC: 33 g/dL (ref 30.0–36.0)
MCV: 94.8 fL (ref 78.0–100.0)
PLATELETS: 153 10*3/uL (ref 150–400)
RBC: 4 MIL/uL (ref 3.87–5.11)
RDW: 12.9 % (ref 11.5–15.5)
WBC: 6.8 10*3/uL (ref 4.0–10.5)

## 2014-05-25 LAB — LIPID PANEL
Cholesterol: 176 mg/dL (ref 0–200)
HDL: 34 mg/dL — AB (ref >39)
LDL CALC: 107 mg/dL — AB (ref 0–99)
Total CHOL/HDL Ratio: 5.2 RATIO
Triglycerides: 173 mg/dL — ABNORMAL HIGH (ref <150)
VLDL: 35 mg/dL (ref 0–40)

## 2014-05-25 MED ORDER — POTASSIUM CHLORIDE CRYS ER 20 MEQ PO TBCR
40.0000 meq | EXTENDED_RELEASE_TABLET | Freq: Once | ORAL | Status: AC
  Administered 2014-05-25: 40 meq via ORAL
  Filled 2014-05-25: qty 2

## 2014-05-25 MED ORDER — HYDROCHLOROTHIAZIDE 25 MG PO TABS
25.0000 mg | ORAL_TABLET | Freq: Every day | ORAL | Status: DC
  Administered 2014-05-25 – 2014-05-27 (×3): 25 mg via ORAL
  Filled 2014-05-25 (×3): qty 1

## 2014-05-25 MED ORDER — LOSARTAN POTASSIUM 50 MG PO TABS
100.0000 mg | ORAL_TABLET | Freq: Every day | ORAL | Status: DC
  Administered 2014-05-25 – 2014-05-27 (×3): 100 mg via ORAL
  Filled 2014-05-25 (×3): qty 2

## 2014-05-25 MED ORDER — HYDROCORTISONE 2.5 % RE CREA
TOPICAL_CREAM | Freq: Two times a day (BID) | RECTAL | Status: DC
  Administered 2014-05-25 – 2014-05-26 (×4): via TOPICAL
  Filled 2014-05-25: qty 28.35

## 2014-05-25 MED ORDER — HYDROCORTISONE ACETATE 25 MG RE SUPP
25.0000 mg | Freq: Two times a day (BID) | RECTAL | Status: DC
  Administered 2014-05-25 – 2014-05-27 (×5): 25 mg via RECTAL
  Filled 2014-05-25 (×6): qty 1

## 2014-05-25 NOTE — Progress Notes (Signed)
Utilization Review completed.  

## 2014-05-25 NOTE — Progress Notes (Signed)
[facility] PROGRESS NOTE  Kimberly Acevedo TDV:[id number] DOB: May 22, 1929 DOA: 05/24/2014 PCP: [name], MD  Assessment/Plan: #1 acute diverticulitis Patient with clinical improvement. Continue empiric IV ciprofloxacin and IV Flagyl. Continue clear liquids for now. Pain management. IV fluids. Supportive care. Follow.  #2 probable chemical pancreatitis. CT of the abdomen and pelvis with a normal pancreas. Patient status post cholecystectomy. Patient tolerating clear liquids with no pain. Lipase levels trending down. Triglycerides levels are 173.  #3 hypokalemia Replete.  #4 gastroesophageal reflux disease GI cocktail when necessary, PPI.  #5 hypertension BP stable. Continue to hold antihypertensive medications.  #6 leukocytosis Likely secondary to problem #1. Improvement with empiric IV antibiotics.  #7 depression Continue Zoloft.  #8 dementia Likely at baseline. Continue home regimen of Namenda and Aricept.    Code Status: Full Family Communication: Updated patient and family at bedside. Disposition Plan: Home when medically stable.   Consultants:  None  Procedures:  CT abdomen and pelvis 05/24/2014  Antibiotics:  IV ciprofloxacin 05/24/2014  IV Flagyl 05/24/2014  HPI/Subjective: Patient states she's feeling better. Patient denies any nausea or vomiting. Patient denies any abdominal pain.  Objective: Filed Vitals:   05/25/14 1235  BP: 118/57  Pulse: 84  Temp: 98.7 F (37.1 C)  Resp: 18    Intake/Output Summary (Last 24 hours) at 05/25/14 1600 Last data filed at 05/25/14 1230  Gross per 24 hour  Intake 3116.67 ml  Output   1000 ml  Net 2116.67 ml   Filed Weights   05/24/14 2047  Weight: 74.6 kg (164 lb 7.4 oz)    Exam:   General:  NAD  Cardiovascular: RRR  Respiratory: CTAB  Abdomen: Soft, mildly distended, positive bowel sounds, nontender to palpation.  Musculoskeletal: No clubbing cyanosis or edema.  Data  Reviewed: Basic Metabolic Panel:  Recent Labs Lab 05/22/14 1025 05/24/14 1633 05/25/14 0515  NA 138 133* 138  K 3.4* 3.5* 3.4*  CL 98 92* 101  CO2 27 24 25   GLUCOSE 114* 108* 104*  BUN 14 16 10   CREATININE 0.74 0.73 0.81  CALCIUM 9.4 9.4 7.9*   Liver Function Tests:  Recent Labs Lab 05/22/14 1025 05/24/14 1633 05/25/14 0515  AST 21 26 24   ALT 15 16 12   ALKPHOS 80 65 48  BILITOT 0.3 0.5 0.6  PROT 7.3 7.7 5.7*  ALBUMIN 3.7 3.8 2.9*    Recent Labs Lab 05/24/14 1633 05/25/14 0515  LIPASE 73* 40   No results for input(s): AMMONIA in the last 168 hours. CBC:  Recent Labs Lab 05/22/14 1025 05/24/14 1633 05/25/14 0515  WBC 6.5 12.8* 6.8  NEUTROABS  --  11.2*  --   HGB 14.8 15.8* 12.5  HCT 43.9 47.1* 37.9  MCV 93.2 93.5 94.8  PLT 198 196 153   Cardiac Enzymes: No results for input(s): CKTOTAL, CKMB, CKMBINDEX, TROPONINI in the last 168 hours. BNP (last 3 results) No results for input(s): PROBNP in the last 8760 hours. CBG: No results for input(s): GLUCAP in the last 168 hours.  No results found for this or any previous visit (from the past 240 hour(s)).   Studies: Ct Abdomen Pelvis W Contrast  05/24/2014   CLINICAL DATA:  Low abdominal pain with rectal bleeding. History of inflammatory bowel syndrome.  EXAM: CT ABDOMEN AND PELVIS WITH CONTRAST  TECHNIQUE: Multidetector CT imaging of the abdomen and pelvis was performed using the standard protocol following bolus administration of intravenous contrast.  CONTRAST:  150mL OMNIPAQUE IOHEXOL 300 MG/ML SOLN, 43mL OMNIPAQUE  IOHEXOL 300 MG/ML SOLN  COMPARISON:  08/20/2011  FINDINGS: BODY WALL: Unremarkable.  LOWER CHEST: Mild scarring RIGHT middle lobe. Mild cardiomegaly. Hiatal hernia.  ABDOMEN/PELVIS:  Liver: No focal abnormality. Lateral segment LEFT lobe extends into LEFT upper quadrant.  Biliary: No evidence of biliary obstruction or stone. Cholecystectomy.  Pancreas: Unremarkable.  Spleen: Unremarkable.   Adrenals: Unremarkable.  Kidneys and ureters: No hydronephrosis or stone. Continued enlargement of upper pole medially projecting LEFT renal lesion, 24 x 24 x 22 mm having 31 Hounsfield unit attenuation.  Bladder: Unremarkable.  Reproductive: Unremarkable.  Status post hysterectomy.  Bowel: No obstruction. No appendiceal inflammation. Extensive colonic diverticulosis with bowel wall thickening in the descending and proximal sigmoid segments consistent with diverticulitis. No surrounding abscess or free perforation.  Retroperitoneum: No mass or adenopathy.  Peritoneum: No free fluid or gas.  Vascular: No acute abnormality.  Atherosclerosis.  OSSEOUS: No acute abnormalities.  IMPRESSION: Diverticulitis of the descending and proximal sigmoid colon with generalized diverticulosis. This appearance is similar to priors.  Progressive enlargement of the medial projecting LEFT upper pole lesion. Further evaluation with outpatient pre and post-contrast abdominal MRI or CT is recommended.   Electronically Signed   By: Rolla Flatten M.D.   On: 05/24/2014 19:03    Scheduled Meds: . ciprofloxacin  400 mg Intravenous Q12H  . feeding supplement (RESOURCE BREEZE)  1 Container Oral TID BM  . heparin  5,000 Units Subcutaneous 3 times per day  . losartan  100 mg Oral Daily   And  . hydrochlorothiazide  25 mg Oral Daily  . hydrocortisone   Topical BID  . hydrocortisone  25 mg Rectal BID  . Memantine HCl ER  21 mg Oral Daily  . metronidazole  500 mg Intravenous Q8H  . pantoprazole (PROTONIX) IV  40 mg Intravenous Q24H  . sertraline  100 mg Oral QHS   Continuous Infusions: . sodium chloride 100 mL/hr at 05/25/14 1109    Principal Problem:   Diverticulitis Active Problems:   Hypertension   Fibromyalgia   Depression   Dementia with behavioral disturbance   Pancreatitis   Leukocytosis   GERD (gastroesophageal reflux disease)   Diverticulitis of large intestine without perforation or abscess without  bleeding    Time spent: 50 minutes   [facility] MD [facility] Pager [phone]. If 7PM-7AM, please contact night-coverage at [email or web address], password [facility] 05/25/2014, 4:00 PM  LOS: 1 day

## 2014-05-25 NOTE — Progress Notes (Signed)
CARE MANAGEMENT NOTE 05/25/2014  Patient:  Kimberly Acevedo, Kimberly Acevedo   Account Number:  [id number]  Date Initiated:  05/25/2014  Documentation initiated by:  Dessa Phi  Subjective/Objective Assessment:   80 y/o f admitted w/diverticulitis.     Action/Plan:   From home.   Anticipated DC Date:  05/29/2014   Anticipated DC Plan:  HOME/SELF CARE         Choice offered to / List presented to:             Status of service:  In process, will continue to follow Medicare Important Message given?   (If response is "NO", the following Medicare IM given date fields will be blank) Date Medicare IM given:   Medicare IM given by:   Date Additional Medicare IM given:   Additional Medicare IM given by:    Discharge Disposition:    Per UR Regulation:  Reviewed for med. necessity/level of care/duration of stay  If discussed at [place] of Stay Meetings, dates discussed:    Comments:

## 2014-05-26 LAB — BASIC METABOLIC PANEL
Anion gap: 11 (ref 5–15)
BUN: 5 mg/dL — AB (ref 6–23)
CHLORIDE: 107 meq/L (ref 96–112)
CO2: 25 mEq/L (ref 19–32)
CREATININE: 0.81 mg/dL (ref 0.50–1.10)
Calcium: 8.3 mg/dL — ABNORMAL LOW (ref 8.4–10.5)
GFR calc non Af Amer: 65 mL/min — ABNORMAL LOW (ref >90)
GFR, EST AFRICAN AMERICAN: 76 mL/min — AB (ref >90)
GLUCOSE: 99 mg/dL (ref 70–99)
Potassium: 3.3 mEq/L — ABNORMAL LOW (ref 3.7–5.3)
Sodium: 143 mEq/L (ref 137–147)

## 2014-05-26 LAB — MAGNESIUM: Magnesium: 1.5 mg/dL (ref 1.5–2.5)

## 2014-05-26 LAB — CBC
HCT: 36.3 % (ref 36.0–46.0)
HEMOGLOBIN: 12 g/dL (ref 12.0–15.0)
MCH: 31.1 pg (ref 26.0–34.0)
MCHC: 33.1 g/dL (ref 30.0–36.0)
MCV: 94 fL (ref 78.0–100.0)
Platelets: 129 10*3/uL — ABNORMAL LOW (ref 150–400)
RBC: 3.86 MIL/uL — ABNORMAL LOW (ref 3.87–5.11)
RDW: 12.9 % (ref 11.5–15.5)
WBC: 4.9 10*3/uL (ref 4.0–10.5)

## 2014-05-26 MED ORDER — METRONIDAZOLE 500 MG PO TABS
500.0000 mg | ORAL_TABLET | Freq: Three times a day (TID) | ORAL | Status: DC
  Administered 2014-05-26 – 2014-05-27 (×3): 500 mg via ORAL
  Filled 2014-05-26 (×6): qty 1

## 2014-05-26 MED ORDER — CIPROFLOXACIN HCL 500 MG PO TABS
500.0000 mg | ORAL_TABLET | Freq: Two times a day (BID) | ORAL | Status: DC
  Administered 2014-05-26 – 2014-05-27 (×2): 500 mg via ORAL
  Filled 2014-05-26 (×5): qty 1

## 2014-05-26 MED ORDER — POTASSIUM CHLORIDE CRYS ER 20 MEQ PO TBCR
40.0000 meq | EXTENDED_RELEASE_TABLET | ORAL | Status: AC
  Administered 2014-05-26 (×2): 40 meq via ORAL
  Filled 2014-05-26 (×2): qty 2

## 2014-05-26 MED ORDER — DEXTROSE 5 % IV SOLN
3.0000 g | Freq: Once | INTRAVENOUS | Status: AC
  Administered 2014-05-26: 3 g via INTRAVENOUS
  Filled 2014-05-26: qty 6

## 2014-05-26 MED ORDER — LORAZEPAM 0.5 MG PO TABS
0.5000 mg | ORAL_TABLET | Freq: Three times a day (TID) | ORAL | Status: DC | PRN
  Administered 2014-05-26: 0.5 mg via ORAL
  Filled 2014-05-26: qty 1

## 2014-05-26 NOTE — Plan of Care (Signed)
Problem: Food- and Nutrition-Related Knowledge Deficit (NB-1.1) Goal: Nutrition education Formal process to instruct or train a patient/client in a skill or to impart knowledge to help patients/clients voluntarily manage or modify food choices and eating behavior to maintain or improve health. Outcome: Completed/Met Date Met:  05/26/14 Nutrition Education Note  RD consulted for nutrition education regarding diverticulitis. Pt and caregiver present for education.  RD provided "Low Fiber Nutrition Therapy" and "High Fiber Nutrition Therapy" handouts from the Academy of [employer]. Reviewed patient's dietary recall. Provided examples of low fiber food to include in diet. Discouraged intake of higher fiber foods during diverticulitis "flare-ups". Explained to pt how to slowly increase fiber intake over time to prevent "flare-ups". Encouraged adequate fluid intake. Teach back method used.  Expect fair-good compliance with assistance from caregivers.  Body mass index is 26.56 kg/(m^2). Pt meets criteria for overweight based on current BMI.  Current diet order is soft diet, patient is consuming approximately 100% of meals at this time. Labs and medications reviewed. No further nutrition interventions warranted at this time. RD contact information provided. If additional nutrition issues arise, please re-consult RD.   , MS, RD, LDN Pager: [phone] After Hours Pager: [phone]        

## 2014-05-26 NOTE — Progress Notes (Signed)
Nutrition Brief Note  Patient identified on the Malnutrition Screening Tool (MST) Report  Pt with no recent weight loss. PT reports feeling very hungry and was happy with diet advancement. Diet education provided, see other note.  Wt Readings from Last 15 Encounters:  05/24/14 164 lb 7.4 oz (74.6 kg)  04/26/14 145 lb (65.772 kg)  02/26/14 156 lb 12.8 oz (71.124 kg)  05/22/13 158 lb 8.2 oz (71.9 kg)  11/22/12 168 lb (76.204 kg)  11/22/12 167 lb 6.4 oz (75.932 kg)  09/14/12 167 lb 6.4 oz (75.932 kg)  09/04/12 167 lb 9.6 oz (76.023 kg)  08/29/12 166 lb (75.297 kg)  08/28/12 167 lb 11.2 oz (76.068 kg)  08/21/12 162 lb 9.6 oz (73.755 kg)  08/14/12 166 lb 4.8 oz (75.433 kg)  07/27/12 166 lb 1.6 oz (75.342 kg)  07/25/12 165 lb (74.844 kg)  07/24/12 165 lb 4.8 oz (74.98 kg)    Body mass index is 26.56 kg/(m^2). Patient meets criteria for overweight based on current BMI.   Current diet order is soft diet, patient is consuming approximately 100% of meals at this time. Labs and medications reviewed.   No nutrition interventions warranted at this time. If nutrition issues arise, please consult RD.   Clayton Bibles, MS, RD, LDN Pager: [phone] After Hours Pager: [phone]

## 2014-05-26 NOTE — Progress Notes (Signed)
[facility] PROGRESS NOTE  Kimberly Acevedo UYQ:[id number] DOB: July 12, 1928 DOA: 05/24/2014 PCP: [name], MD  Assessment/Plan: #1 acute diverticulitis Patient with clinical improvement. Patient tolerating full liquids. Advance to soft diet. Change IV Ciprofloxacin and flagyl to oral. Pain management. NSL IV fluids. Supportive care. Follow.  #2 probable chemical pancreatitis. CT of the abdomen and pelvis with a normal pancreas. Patient status post cholecystectomy. Patient tolerating full liquids with no pain. Advance to soft diet. Lipase levels trending down. Triglycerides levels are 173.  #3 hypokalemia Replete.  #4 gastroesophageal reflux disease GI cocktail when necessary, PPI.  #5 hypertension BP stable. Continue to hold antihypertensive medications.  #6 leukocytosis Likely secondary to problem #1. Improving with empiric IV antibiotics.  #7 depression Continue Zoloft.  #8 dementia Likely at baseline. Continue home regimen of Namenda and Aricept.    Code Status: Full Family Communication: Updated patient and caretaker at bedside. Disposition Plan: Home when medically stable, tommorrow   Consultants:  None  Procedures:  CT abdomen and pelvis 05/24/2014  Antibiotics:  IV ciprofloxacin 05/24/2014>>>>oral cipro 05/26/14  IV Flagyl 05/24/2014>>>>oral flagyl 05/26/14  HPI/Subjective: Patient states she's feeling better. Patient denies any nausea or vomiting. Patient tolerating current diet. Patient wants to go home.   Objective: Filed Vitals:   05/26/14 1320  BP: 130/54  Pulse: 70  Temp: 98 F (36.7 C)  Resp: 16    Intake/Output Summary (Last 24 hours) at 05/26/14 1445 Last data filed at 05/26/14 1230  Gross per 24 hour  Intake    720 ml  Output      0 ml  Net    720 ml   Filed Weights   05/24/14 2047  Weight: 74.6 kg (164 lb 7.4 oz)    Exam:   General:  NAD  Cardiovascular: RRR  Respiratory: CTAB  Abdomen: Soft, mildly  distended, positive bowel sounds, nontender to palpation.  Musculoskeletal: No clubbing cyanosis or edema.  Data Reviewed: Basic Metabolic Panel:  Recent Labs Lab 05/22/14 1025 05/24/14 1633 05/25/14 0515 05/26/14 0450  NA 138 133* 138 143  K 3.4* 3.5* 3.4* 3.3*  CL 98 92* 101 107  CO2 27 24 25 25   GLUCOSE 114* 108* 104* 99  BUN 14 16 10  5*  CREATININE 0.74 0.73 0.81 0.81  CALCIUM 9.4 9.4 7.9* 8.3*  MG  --   --   --  1.5   Liver Function Tests:  Recent Labs Lab 05/22/14 1025 05/24/14 1633 05/25/14 0515  AST 21 26 24   ALT 15 16 12   ALKPHOS 80 65 48  BILITOT 0.3 0.5 0.6  PROT 7.3 7.7 5.7*  ALBUMIN 3.7 3.8 2.9*    Recent Labs Lab 05/24/14 1633 05/25/14 0515  LIPASE 73* 40   No results for input(s): AMMONIA in the last 168 hours. CBC:  Recent Labs Lab 05/22/14 1025 05/24/14 1633 05/25/14 0515 05/26/14 0450  WBC 6.5 12.8* 6.8 4.9  NEUTROABS  --  11.2*  --   --   HGB 14.8 15.8* 12.5 12.0  HCT 43.9 47.1* 37.9 36.3  MCV 93.2 93.5 94.8 94.0  PLT 198 196 153 129*   Cardiac Enzymes: No results for input(s): CKTOTAL, CKMB, CKMBINDEX, TROPONINI in the last 168 hours. BNP (last 3 results) No results for input(s): PROBNP in the last 8760 hours. CBG: No results for input(s): GLUCAP in the last 168 hours.  No results found for this or any previous visit (from the past 240 hour(s)).   Studies: Ct Abdomen Pelvis W Contrast  05/24/2014   CLINICAL DATA:  Low abdominal pain with rectal bleeding. History of inflammatory bowel syndrome.  EXAM: CT ABDOMEN AND PELVIS WITH CONTRAST  TECHNIQUE: Multidetector CT imaging of the abdomen and pelvis was performed using the standard protocol following bolus administration of intravenous contrast.  CONTRAST:  180mL OMNIPAQUE IOHEXOL 300 MG/ML SOLN, 29mL OMNIPAQUE IOHEXOL 300 MG/ML SOLN  COMPARISON:  08/20/2011  FINDINGS: BODY WALL: Unremarkable.  LOWER CHEST: Mild scarring RIGHT middle lobe. Mild cardiomegaly. Hiatal hernia.   ABDOMEN/PELVIS:  Liver: No focal abnormality. Lateral segment LEFT lobe extends into LEFT upper quadrant.  Biliary: No evidence of biliary obstruction or stone. Cholecystectomy.  Pancreas: Unremarkable.  Spleen: Unremarkable.  Adrenals: Unremarkable.  Kidneys and ureters: No hydronephrosis or stone. Continued enlargement of upper pole medially projecting LEFT renal lesion, 24 x 24 x 22 mm having 31 Hounsfield unit attenuation.  Bladder: Unremarkable.  Reproductive: Unremarkable.  Status post hysterectomy.  Bowel: No obstruction. No appendiceal inflammation. Extensive colonic diverticulosis with bowel wall thickening in the descending and proximal sigmoid segments consistent with diverticulitis. No surrounding abscess or free perforation.  Retroperitoneum: No mass or adenopathy.  Peritoneum: No free fluid or gas.  Vascular: No acute abnormality.  Atherosclerosis.  OSSEOUS: No acute abnormalities.  IMPRESSION: Diverticulitis of the descending and proximal sigmoid colon with generalized diverticulosis. This appearance is similar to priors.  Progressive enlargement of the medial projecting LEFT upper pole lesion. Further evaluation with outpatient pre and post-contrast abdominal MRI or CT is recommended.   Electronically Signed   By: Rolla Flatten M.D.   On: 05/24/2014 19:03    Scheduled Meds: . ciprofloxacin  400 mg Intravenous Q12H  . feeding supplement (RESOURCE BREEZE)  1 Container Oral TID BM  . heparin  5,000 Units Subcutaneous 3 times per day  . losartan  100 mg Oral Daily   And  . hydrochlorothiazide  25 mg Oral Daily  . hydrocortisone   Topical BID  . hydrocortisone  25 mg Rectal BID  . Memantine HCl ER  21 mg Oral Daily  . metronidazole  500 mg Intravenous Q8H  . pantoprazole (PROTONIX) IV  40 mg Intravenous Q24H  . potassium chloride  40 mEq Oral Q4H  . sertraline  100 mg Oral QHS   Continuous Infusions: . sodium chloride 75 mL/hr at 05/26/14 1308    Principal Problem:    Diverticulitis Active Problems:   Hypertension   Fibromyalgia   Depression   Dementia with behavioral disturbance   Pancreatitis   Leukocytosis   GERD (gastroesophageal reflux disease)   Diverticulitis of large intestine without perforation or abscess without bleeding   Abdominal pain    Time spent: 75 minutes   [facility] MD [facility] Pager [phone]. If 7PM-7AM, please contact night-coverage at [email or web address], password [facility] 05/26/2014, 2:45 PM  LOS: 2 days

## 2014-05-27 DIAGNOSIS — R1084 Generalized abdominal pain: Secondary | ICD-10-CM

## 2014-05-27 LAB — CBC
HCT: 39 % (ref 36.0–46.0)
Hemoglobin: 12.7 g/dL (ref 12.0–15.0)
MCH: 31.1 pg (ref 26.0–34.0)
MCHC: 32.6 g/dL (ref 30.0–36.0)
MCV: 95.6 fL (ref 78.0–100.0)
Platelets: 150 10*3/uL (ref 150–400)
RBC: 4.08 MIL/uL (ref 3.87–5.11)
RDW: 13.1 % (ref 11.5–15.5)
WBC: 4.5 10*3/uL (ref 4.0–10.5)

## 2014-05-27 LAB — BASIC METABOLIC PANEL
Anion gap: 12 (ref 5–15)
BUN: 10 mg/dL (ref 6–23)
CALCIUM: 8.7 mg/dL (ref 8.4–10.5)
CO2: 24 mEq/L (ref 19–32)
CREATININE: 0.8 mg/dL (ref 0.50–1.10)
Chloride: 106 mEq/L (ref 96–112)
GFR calc non Af Amer: 66 mL/min — ABNORMAL LOW (ref >90)
GFR, EST AFRICAN AMERICAN: 77 mL/min — AB (ref >90)
GLUCOSE: 105 mg/dL — AB (ref 70–99)
Potassium: 4.2 mEq/L (ref 3.7–5.3)
Sodium: 142 mEq/L (ref 137–147)

## 2014-05-27 LAB — MAGNESIUM: Magnesium: 2 mg/dL (ref 1.5–2.5)

## 2014-05-27 MED ORDER — METRONIDAZOLE 500 MG PO TABS: 500.0000 mg | ORAL_TABLET | Freq: Three times a day (TID) | ORAL | Status: DC

## 2014-05-27 MED ORDER — BOOST / RESOURCE BREEZE PO LIQD: 1.0000 | Freq: Three times a day (TID) | ORAL | Status: DC

## 2014-05-27 MED ORDER — CIPROFLOXACIN HCL 500 MG PO TABS: 500.0000 mg | ORAL_TABLET | Freq: Two times a day (BID) | ORAL | Status: DC

## 2014-05-27 NOTE — Evaluation (Signed)
Occupational Therapy Evaluation Patient Details Name: Kimberly Acevedo MRN: [id number] DOB: 1928/12/14 Today's Date: 05/27/2014    History of Present Illness 80 yo female admitted with diverticulitis. Hx of fibromyalgia, arthritis, HTN, dizziness, dementia. Pt is from home with caregivers.    Clinical Impression   ,Pt admitted with diverticulitis . Pt currently with functional limitations due to the deficits listed below (see OT Problem List).  Pt will benefit from skilled OT to increase their safety and independence with ADL and functional mobility for ADL to facilitate discharge to venue listed below.     Follow Up Recommendations  No OT follow up;Other (comment) (pt has 24/7 A with ADL activity at home)    Equipment Recommendations  None recommended by OT       Precautions / Restrictions Precautions Precautions: Fall Restrictions Weight Bearing Restrictions: No      Mobility Bed Mobility Overal bed mobility: Modified Independent                Transfers Overall transfer level: Needs assistance Equipment used: 1 person hand held assist                  Balance Overall balance assessment: Needs assistance;History of Falls Sitting-balance support: No upper extremity supported;Feet supported Sitting balance-Leahy Scale: Normal     Standing balance support: During functional activity Standing balance-Leahy Scale: Good                              ADL Overall ADL's : Needs assistance/impaired     Grooming: Standing;Oral care;Min guard   Upper Body Bathing: Set up;Sitting   Lower Body Bathing: Min guard;Sit to/from stand   Upper Body Dressing : Set up;Sitting   Lower Body Dressing: Min guard;Sit to/from stand   Toilet Transfer: Min guard;Comfort height toilet;Cueing for Designer, television/film set and Hygiene: Min guard;Sit to/from stand       Functional mobility during ADLs: Min guard General ADL Comments: pt has  24/7 A . Pt is agitated this session and needed VC to slow down as pt wants to leave this day.      Vision                            Pertinent Vitals/Pain Pain Assessment: Faces Faces Pain Scale: Hurts a little bit Pain Location: both knees Pain Descriptors / Indicators: Aching;Sore Pain Intervention(s): Monitored during session;Repositioned        Extremity/Trunk Assessment Upper Extremity Assessment Upper Extremity Assessment: Generalized weakness   Lower Extremity Assessment Lower Extremity Assessment: Generalized weakness   Cervical / Trunk Assessment Cervical / Trunk Assessment: Normal   Communication Communication Communication: No difficulties   Cognition Arousal/Alertness: Awake/alert Behavior During Therapy: Agitated Overall Cognitive Status: History of cognitive impairments - at baseline                     General Comments   encouraged caregiver to encourage pt to perform ADL's as able rather than doing everything for her to A her in getting stronger            Home Living Family/patient expects to be discharged to:: Private residence   Available Help at Discharge: Personal care attendant (near 24 hour care) Type of Home: House Home Access: Stairs to enter [employer] of Steps: 6 Entrance Stairs-Rails: Right Home Layout: Two level;Able to live on main  level with bedroom/bathroom     Bathroom Shower/Tub: Occupational psychologist: Standard     Home Equipment: Cane - single point          Prior Functioning/Environment Level of Independence: Independent             OT Diagnosis: Generalized weakness   OT Problem List: Decreased strength;Decreased activity tolerance   OT Treatment/Interventions: Self-care/ADL training;DME and/or AE instruction;Patient/family education    OT Goals(Current goals can be found in the care plan section) Acute Rehab OT Goals Patient Stated Goal: home today! OT Goal  Formulation: With patient Time For Goal Achievement: 06/03/14 Potential to Achieve Goals: Good ADL Goals Pt Will Perform Grooming: with supervision;standing Pt Will Perform Lower Body Dressing: with supervision;sit to/from stand Pt Will Transfer to Toilet: with supervision;regular height toilet Pt Will Perform Toileting - Clothing Manipulation and hygiene: with supervision;sit to/from stand Pt Will Perform Tub/Shower Transfer: Shower transfer;grab bars;shower seat  OT Frequency: Min 2X/week              End of Session Equipment Utilized During Treatment: Gait belt  Activity Tolerance: Treatment limited secondary to agitation Patient left: in bed;with call bell/phone within reach;with family/visitor present   Time: 9030-0923 OT Time Calculation (min): 20 min Charges:  OT General Charges $OT Visit: 1 Procedure OT Evaluation $Initial OT Evaluation Tier I: 1 Procedure OT Treatments $Self Care/Home Management : 8-22 mins G-Codes:    Payton Mccallum D 06/14/14, 12:19 PM

## 2014-05-27 NOTE — Discharge Summary (Signed)
Physician Discharge Summary  Kimberly Acevedo:[id number] DOB: 1929-06-02 DOA: 05/24/2014  PCP: Kimberly Pel, MD  Admit date: 05/24/2014 Discharge date: 05/27/2014  Time spent: 65 minutes  Recommendations for Outpatient Follow-up:  1. Follow-up with Kimberly Pel, MD in 1 week. Follow-up patient in need a basic metabolic profile done to follow-up on electrolytes and renal function. Patient's blood pressure also needs to be reassessed on follow-up.  Discharge Diagnoses:  Principal Problem:   Diverticulitis Active Problems:   Hypertension   Fibromyalgia   Depression   Dementia with behavioral disturbance   Pancreatitis   Leukocytosis   GERD (gastroesophageal reflux disease)   Diverticulitis of large intestine without perforation or abscess without bleeding   Abdominal pain   Generalized abdominal pain   Discharge Condition: Stable and improved  Diet recommendation: Regular soft diet.  Filed Weights   05/24/14 2047  Weight: 74.6 kg (164 lb 7.4 oz)    History of present illness:  Kimberly Acevedo is a 80 y.o. female  With chronic abdominal pain who presented with acute worsening of abdominal pain 1 week prior to admission. Patient had tried Tylenol without any benefits. Patient was also noted to have intermittent diarrhea which was nonbloody. On the day of admission  patient developed nausea and nonbloody nonbilious emesis with some associated severe esophageal burning sensation. CT of the abdomen and pelvis which was done on 05/24/2014 was consistent with acute diverticulitis.  Hospital Course:  #1 acute diverticulitis Patient had presented with nausea vomiting abdominal pain. CT of the abdomen and pelvis done on admission was consistent with acute diverticulitis. Patient was initially placed on bowel rest. Patient was started on empiric IV ciprofloxacin and IV Flagyl. Patient was also placed on IV fluids and supportive care. Patient improved clinically on a  daily basis. Patient's diet was slowly advanced which she tolerated and patient was on a soft diet by day of discharge. Patient's IV antibiotics were transitioned to oral antibiotics which she tolerated. Patient be discharged on 5 days of oral ciprofloxacin and Flagyl to complete a one-week course of therapy. Patient will follow-up with PCP as outpatient. Once patient's acute diverticulitis has resolved patient may benefit from a GI consultation.   #2 probable chemical pancreatitis. CT of the abdomen and pelvis with a normal pancreas. Patient status post cholecystectomy. Patient was noted to have a elevated lipase on admission. Patient was initially made nothing by mouth diet was advanced which she tolerated. Lipase levels trended down. Triglycerides levels was 173. Outpatient follow-up.  #3 hypokalemia Patient was noted to be hypokalemic on admission. This was likely felt to be secondary to GI losses. Patient's potassium was repleted and hypokalemia had resolved by day of discharge.   #4 gastroesophageal reflux disease Patient was maintained on a PPI during the hospitalization and a GI cocktail when necessary.  #5 hypertension During the hospitalization patient was noted to have borderline blood pressure issues. Patient's antihypertensive medications were held. Patient's blood pressure remained stable. Blood pressure medications will be resumed on discharge. Patient will follow-up with PCP as outpatient.  #6 leukocytosis Likely secondary to problem #1. Improved with empiric IV antibiotics. Patient improved clinically WBC trended down and had resolved by day of discharge.  #7 depression Continued on home regimen of Zoloft.  #8 dementia Likely at baseline. Continued on home regimen of Namenda and Aricept.    Procedures:  CT abdomen and pelvis 05/24/2014  Consultations:  None  Discharge Exam: Filed Vitals:   05/27/14 0415  BP: 140/63  Pulse: 78  Temp: 98.1 F (36.7 C)  Resp: 16     General: NAD Cardiovascular: RRR Respiratory: CTAB Abdomen: Soft, nontender, nondistended, positive bowel sounds.  Discharge Instructions   Discharge Instructions    Diet general    Complete by:  As directed   Soft diet     Discharge instructions    Complete by:  As directed   Follow up with Kimberly Pel, MD in 1 week.     Increase activity slowly    Complete by:  As directed           Current Discharge Medication List    START taking these medications   Details  ciprofloxacin (CIPRO) 500 MG tablet Take 1 tablet (500 mg total) by mouth 2 (two) times daily. Take for 5 days then stop. Qty: 10 tablet, Refills: 0    feeding supplement, RESOURCE BREEZE, (RESOURCE BREEZE) LIQD Take 1 Container by mouth 3 (three) times daily between meals. Refills: 0    metroNIDAZOLE (FLAGYL) 500 MG tablet Take 1 tablet (500 mg total) by mouth every 8 (eight) hours. Take for 5 days then stop. Qty: 15 tablet, Refills: 0      CONTINUE these medications which have NOT CHANGED   Details  acetaminophen (TYLENOL) 500 MG tablet Take 500 mg by mouth every 6 (six) hours as needed. For arthritis    Cholecalciferol (VITAMIN D) 2000 UNITS CAPS Take 1 capsule by mouth daily.    hydrocortisone (ANUSOL-HC) 25 MG suppository Place 1 suppository (25 mg total) rectally 2 (two) times daily. For 7 days Qty: 14 suppository, Refills: 0    losartan-hydrochlorothiazide (HYZAAR) 100-25 MG per tablet Take 1 tablet by mouth daily.     NAMENDA XR 21 MG CP24 Take 1 capsule by mouth daily.     PROCTOSOL HC 2.5 % rectal cream Place 1 application rectally 2 (two) times daily.     sertraline (ZOLOFT) 100 MG tablet Take 100 mg by mouth at bedtime.     vitamin B-12 (CYANOCOBALAMIN) 1000 MCG tablet Take 1,000 mcg by mouth daily.    polyethylene glycol (MIRALAX / GLYCOLAX) packet Take 17 g by mouth daily as needed for mild constipation or moderate constipation. Use as directed until your bowel movements are  soft and coming daily, then you may decrease to every other day to continue having daily soft BMs Qty: 14 each, Refills: 0       Allergies  Allergen Reactions  . Penicillins Swelling and Rash  . Statins   . Sulfa Drugs Cross Reactors Shortness Of Breath  . Tramadol Hcl Anaphylaxis  . Aspirin Swelling  . Codeine Other (See Comments)    unknown  . Morphine And Related Other (See Comments)    Hypoglycemia   Follow-up Information    Follow up with Kimberly Pel, MD. Schedule an appointment as soon as possible for a visit in 1 week.   Specialty:  Internal Medicine   Contact information:   [address] [place] [address] [phone]        The results of significant diagnostics from this hospitalization (including imaging, microbiology, ancillary and laboratory) are listed below for reference.    Significant Diagnostic Studies: Ct Abdomen Pelvis W Contrast  05/24/2014   CLINICAL DATA:  Low abdominal pain with rectal bleeding. History of inflammatory bowel syndrome.  EXAM: CT ABDOMEN AND PELVIS WITH CONTRAST  TECHNIQUE: Multidetector CT imaging of the abdomen and pelvis was performed using the standard protocol following bolus administration of  intravenous contrast.  CONTRAST:  165mL OMNIPAQUE IOHEXOL 300 MG/ML SOLN, 56mL OMNIPAQUE IOHEXOL 300 MG/ML SOLN  COMPARISON:  08/20/2011  FINDINGS: BODY WALL: Unremarkable.  LOWER CHEST: Mild scarring RIGHT middle lobe. Mild cardiomegaly. Hiatal hernia.  ABDOMEN/PELVIS:  Liver: No focal abnormality. Lateral segment LEFT lobe extends into LEFT upper quadrant.  Biliary: No evidence of biliary obstruction or stone. Cholecystectomy.  Pancreas: Unremarkable.  Spleen: Unremarkable.  Adrenals: Unremarkable.  Kidneys and ureters: No hydronephrosis or stone. Continued enlargement of upper pole medially projecting LEFT renal lesion, 24 x 24 x 22 mm having 31 Hounsfield unit attenuation.  Bladder: Unremarkable.  Reproductive:  Unremarkable.  Status post hysterectomy.  Bowel: No obstruction. No appendiceal inflammation. Extensive colonic diverticulosis with bowel wall thickening in the descending and proximal sigmoid segments consistent with diverticulitis. No surrounding abscess or free perforation.  Retroperitoneum: No mass or adenopathy.  Peritoneum: No free fluid or gas.  Vascular: No acute abnormality.  Atherosclerosis.  OSSEOUS: No acute abnormalities.  IMPRESSION: Diverticulitis of the descending and proximal sigmoid colon with generalized diverticulosis. This appearance is similar to priors.  Progressive enlargement of the medial projecting LEFT upper pole lesion. Further evaluation with outpatient pre and post-contrast abdominal MRI or CT is recommended.   Electronically Signed   By: Rolla Flatten M.D.   On: 05/24/2014 19:03    Microbiology: No results found for this or any previous visit (from the past 240 hour(s)).   Labs: Basic Metabolic Panel:  Recent Labs Lab 05/22/14 1025 05/24/14 1633 05/25/14 0515 05/26/14 0450 05/27/14 0420  NA 138 133* 138 143 142  K 3.4* 3.5* 3.4* 3.3* 4.2  CL 98 92* 101 107 106  CO2 27 24 25 25 24   GLUCOSE 114* 108* 104* 99 105*  BUN 14 16 10  5* 10  CREATININE 0.74 0.73 0.81 0.81 0.80  CALCIUM 9.4 9.4 7.9* 8.3* 8.7  MG  --   --   --  1.5 2.0   Liver Function Tests:  Recent Labs Lab 05/22/14 1025 05/24/14 1633 05/25/14 0515  AST 21 26 24   ALT 15 16 12   ALKPHOS 80 65 48  BILITOT 0.3 0.5 0.6  PROT 7.3 7.7 5.7*  ALBUMIN 3.7 3.8 2.9*    Recent Labs Lab 05/24/14 1633 05/25/14 0515  LIPASE 73* 40   No results for input(s): AMMONIA in the last 168 hours. CBC:  Recent Labs Lab 05/22/14 1025 05/24/14 1633 05/25/14 0515 05/26/14 0450 05/27/14 0420  WBC 6.5 12.8* 6.8 4.9 4.5  NEUTROABS  --  11.2*  --   --   --   HGB 14.8 15.8* 12.5 12.0 12.7  HCT 43.9 47.1* 37.9 36.3 39.0  MCV 93.2 93.5 94.8 94.0 95.6  PLT 198 196 153 129* 150   Cardiac Enzymes: No  results for input(s): CKTOTAL, CKMB, CKMBINDEX, TROPONINI in the last 168 hours. BNP: BNP (last 3 results) No results for input(s): PROBNP in the last 8760 hours. CBG: No results for input(s): GLUCAP in the last 168 hours.     SignedIrine Seal MD [facility] 05/27/2014, 1:43 PM

## 2014-05-27 NOTE — Evaluation (Addendum)
Physical Therapy Evaluation-1x Patient Details Name: Kimberly Acevedo MRN: [id number] DOB: [birth year] Today's Date: 05/27/2014   History of Present Illness  80 yo female admitted with diverticulitis. Hx of fibromyalgia, arthritis, HTN, dizziness, dementia. Pt is from home with caregivers.   Clinical Impression  On eval, pt was Min guard assist for mobility-able to ambulate ~300 feet. Unsteady at times, especially with head turns/scanning of environment. Pt has a cane at home but caregiver states she will not use it. Discussed LE exercises that pt can perform (APs, hip abduction, hip/knee flexion). Recommend HHPT follow up for general strength/conditioning and balance training.     Follow Up Recommendations Home health PT;Supervision/Assistance - 24 hour    Equipment Recommendations  None recommended by PT    Recommendations for Other Services       Precautions / Restrictions Precautions Precautions: Fall Restrictions Weight Bearing Restrictions: No      Mobility  Bed Mobility Overal bed mobility: Modified Independent                Transfers Overall transfer level: Modified independent                  Ambulation/Gait Ambulation/Gait assistance: Min guard Ambulation Distance (Feet): 300 Feet Assistive device: None Gait Pattern/deviations: Drifts right/left;Staggering right;Staggering left     General Gait Details: close guard for safety. A few instances of unsteadiness/stumbling but pt did not have overt LOB. Increased unsteadiness with head turns/scanning of environment  Stairs            Wheelchair Mobility    Modified Rankin (Stroke Patients Only)       Balance Overall balance assessment: Needs assistance;History of Falls Sitting-balance support: No upper extremity supported;Feet supported Sitting balance-Leahy Scale: Normal     Standing balance support: During functional activity Standing balance-Leahy Scale: Good                                Pertinent Vitals/Pain Pain Assessment: Faces Faces Pain Scale: Hurts little more Pain Location: bil knees (L worse than R) Pain Descriptors / Indicators: Aching;Sore Pain Intervention(s): Monitored during session    Home Living Family/patient expects to be discharged to:: Private residence   Available Help at Discharge: Personal care attendant (near 24 hour care) Type of Home: House Home Access: Stairs to enter Entrance Stairs-Rails: Right Entrance Stairs-Number of Steps: 6 Home Layout: Two level;Able to live on main level with bedroom/bathroom Home Equipment: [name] - single point      Prior Function Level of Independence: Independent               Hand Dominance        Extremity/Trunk Assessment   Upper Extremity Assessment: Defer to OT evaluation           Lower Extremity Assessment: Generalized weakness      Cervical / Trunk Assessment: Normal  Communication   Communication: No difficulties  Cognition Arousal/Alertness: Awake/alert Behavior During Therapy: WFL for tasks assessed/performed Overall Cognitive Status: History of cognitive impairments - at baseline                      General Comments      Exercises        Assessment/Plan    PT Assessment All further PT needs can be met in the next venue of care  PT Diagnosis Difficulty walking;Generalized weakness   PT Problem List Decreased  balance;Decreased mobility;Pain;Decreased activity tolerance  PT Treatment Interventions     PT Goals (Current goals can be found in the Care Plan section) Acute Rehab PT Goals Patient Stated Goal: home today! PT Goal Formulation: All assessment and education complete, DC therapy    Frequency     Barriers to discharge        Co-evaluation               End of Session Equipment Utilized During Treatment: Gait belt Activity Tolerance: Patient tolerated treatment well Patient left: in bed;with call bell/phone  within reach;with bed alarm set;with family/visitor present           Time: 1041-1107 PT Time Calculation (min) (ACUTE ONLY): 26 min   Charges:   PT Evaluation $Initial PT Evaluation Tier I: 1 Procedure PT Treatments $Gait Training: 23-37 mins   PT G Codes:          Kimberly Acevedo, MPT Pager: [phone]

## 2014-05-27 NOTE — Care Management Note (Signed)
CARE MANAGEMENT NOTE 05/27/2014  Patient:  Kimberly Acevedo, Kimberly Acevedo   Account Number:  [id number]  Date Initiated:  05/27/2014  Documentation initiated by:  Dessa Phi  Subjective/Objective Assessment:   80 y/o f admitted w/diverticulitis.     Action/Plan:   From home.   Anticipated DC Date:  05/29/2014   Anticipated DC Plan:  [place]  CM consult      [facility] Choice  HOME HEALTH   Choice offered to / List presented to:  C-1 Patient        [place] arranged  HH-2 PT      [place].   Status of service:  In process, will continue to follow Medicare Important Message given?  YES (If response is "NO", the following Medicare IM given date fields will be blank) Date Medicare IM given:  05/27/2014 Medicare IM given by:  Marney Doctor Date Additional Medicare IM given:   Additional Medicare IM given by:    Discharge Disposition:    Per UR Regulation:  Reviewed for med. necessity/level of care/duration of stay  If discussed at [place] of Stay Meetings, dates discussed:    Comments:  05/27/14 Marney Doctor RN,BSN,NCM [phone] Order written for [place].  Pt offered choice and she chose [facility]. Referral called to [facility] rep.  Pt to DC home with 24 hr caregivers.  Livia Snellen, RN Registered Nurse Signed CASE MANAGEMENT Progress Note 05/24/2014  7:54 PM  EDCM spoke to patient and her two caretakers at home.  Patient lives at home alone but has 24/7 caretakers at home.  Patient's daughter also lives next door.  Patient's caregivers report patient requires assistance with ADLs.  Patient is noted to be wearing oxygen in the ED, patient does not wear oxygen at home.  Patient confirms her pcp is Dr. Deland Pretty.  Patient does not have a GI specialist.  Patient has a cane and a sit down shower at home.  Patient's caretakers assist patient with her meals and requesting dietician consult for proper diet for diverticulitis.  EDCM placed this  request in physician sticky note.  Patient's caretakers report patient may need physical therapy at home.  EDCM provided patient with list of home health agencies in [place].  Patient and caregivers thankful for services.  No further EDCM needs at this time.

## 2014-06-04 ENCOUNTER — Other Ambulatory Visit: Payer: Self-pay | Admitting: Internal Medicine

## 2014-06-04 DIAGNOSIS — N644 Mastodynia: Secondary | ICD-10-CM

## 2014-06-05 ENCOUNTER — Telehealth: Payer: Self-pay | Admitting: *Deleted

## 2014-06-05 NOTE — Telephone Encounter (Signed)
Received call from [facility] at Dr. Pennie Banter office requesting an appt for pt to see a new provider since Dr. Humphrey Rolls is no longer here.  Her mammogram is scheduled for 1/8, so I gave Fraser Din an appt of 06/18/13 for Pat to give to pt.

## 2014-06-12 ENCOUNTER — Ambulatory Visit: Payer: Medicare Other | Admitting: Nurse Practitioner

## 2014-06-14 ENCOUNTER — Other Ambulatory Visit: Payer: Medicare Other

## 2014-06-15 ENCOUNTER — Inpatient Hospital Stay (HOSPITAL_COMMUNITY)
Admission: EM | Admit: 2014-06-15 | Discharge: 2014-06-15 | DRG: 379 | Disposition: A | Discharge status: Home / Self Care | Payer: Medicare Other | Attending: Internal Medicine | Admitting: Internal Medicine

## 2014-06-15 ENCOUNTER — Encounter (HOSPITAL_COMMUNITY): Payer: Self-pay | Admitting: Emergency Medicine

## 2014-06-15 DIAGNOSIS — Z90710 Acquired absence of both cervix and uterus: Secondary | ICD-10-CM | POA: Diagnosis not present

## 2014-06-15 DIAGNOSIS — I517 Cardiomegaly: Secondary | ICD-10-CM | POA: Diagnosis present

## 2014-06-15 DIAGNOSIS — K649 Unspecified hemorrhoids: Secondary | ICD-10-CM | POA: Diagnosis present

## 2014-06-15 DIAGNOSIS — I10 Essential (primary) hypertension: Secondary | ICD-10-CM

## 2014-06-15 DIAGNOSIS — K449 Diaphragmatic hernia without obstruction or gangrene: Secondary | ICD-10-CM | POA: Diagnosis present

## 2014-06-15 DIAGNOSIS — Z808 Family history of malignant neoplasm of other organs or systems: Secondary | ICD-10-CM | POA: Diagnosis not present

## 2014-06-15 DIAGNOSIS — M797 Fibromyalgia: Secondary | ICD-10-CM | POA: Diagnosis present

## 2014-06-15 DIAGNOSIS — M1990 Unspecified osteoarthritis, unspecified site: Secondary | ICD-10-CM | POA: Diagnosis present

## 2014-06-15 DIAGNOSIS — G4730 Sleep apnea, unspecified: Secondary | ICD-10-CM | POA: Diagnosis present

## 2014-06-15 DIAGNOSIS — K219 Gastro-esophageal reflux disease without esophagitis: Secondary | ICD-10-CM | POA: Diagnosis present

## 2014-06-15 DIAGNOSIS — K922 Gastrointestinal hemorrhage, unspecified: Principal | ICD-10-CM | POA: Diagnosis present

## 2014-06-15 DIAGNOSIS — N289 Disorder of kidney and ureter, unspecified: Secondary | ICD-10-CM | POA: Diagnosis present

## 2014-06-15 DIAGNOSIS — F0390 Unspecified dementia without behavioral disturbance: Secondary | ICD-10-CM | POA: Diagnosis present

## 2014-06-15 DIAGNOSIS — E785 Hyperlipidemia, unspecified: Secondary | ICD-10-CM | POA: Diagnosis present

## 2014-06-15 DIAGNOSIS — K625 Hemorrhage of anus and rectum: Secondary | ICD-10-CM

## 2014-06-15 LAB — BASIC METABOLIC PANEL
Anion gap: 10 (ref 5–15)
BUN: 18 mg/dL (ref 6–23)
CALCIUM: 9.2 mg/dL (ref 8.4–10.5)
CO2: 29 mmol/L (ref 19–32)
Chloride: 101 mEq/L (ref 96–112)
Creatinine, Ser: 0.77 mg/dL (ref 0.50–1.10)
GFR calc non Af Amer: 76 mL/min — ABNORMAL LOW (ref >90)
GFR, EST AFRICAN AMERICAN: 88 mL/min — AB (ref >90)
Glucose, Bld: 108 mg/dL — ABNORMAL HIGH (ref 70–99)
Potassium: 3.6 mmol/L (ref 3.5–5.1)
Sodium: 140 mmol/L (ref 135–145)

## 2014-06-15 LAB — CBC WITH DIFFERENTIAL/PLATELET
BASOS ABS: 0 10*3/uL (ref 0.0–0.1)
BASOS PCT: 0 % (ref 0–1)
Eosinophils Absolute: 0.1 10*3/uL (ref 0.0–0.7)
Eosinophils Relative: 1 % (ref 0–5)
HCT: 42.8 % (ref 36.0–46.0)
HEMOGLOBIN: 14.7 g/dL (ref 12.0–15.0)
LYMPHS PCT: 47 % — AB (ref 12–46)
Lymphs Abs: 3.9 10*3/uL (ref 0.7–4.0)
MCH: 32 pg (ref 26.0–34.0)
MCHC: 34.3 g/dL (ref 30.0–36.0)
MCV: 93 fL (ref 78.0–100.0)
MONO ABS: 0.7 10*3/uL (ref 0.1–1.0)
Monocytes Relative: 9 % (ref 3–12)
Neutro Abs: 3.5 10*3/uL (ref 1.7–7.7)
Neutrophils Relative %: 43 % (ref 43–77)
Platelets: 206 10*3/uL (ref 150–400)
RBC: 4.6 MIL/uL (ref 3.87–5.11)
RDW: 12.7 % (ref 11.5–15.5)
WBC: 8.2 10*3/uL (ref 4.0–10.5)

## 2014-06-15 LAB — COMPREHENSIVE METABOLIC PANEL
ALBUMIN: 3.7 g/dL (ref 3.5–5.2)
ALT: 18 U/L (ref 0–35)
AST: 22 U/L (ref 0–37)
Alkaline Phosphatase: 68 U/L (ref 39–117)
Anion gap: 9 (ref 5–15)
BUN: 20 mg/dL (ref 6–23)
CO2: 28 mmol/L (ref 19–32)
CREATININE: 0.81 mg/dL (ref 0.50–1.10)
Calcium: 9.3 mg/dL (ref 8.4–10.5)
Chloride: 101 mEq/L (ref 96–112)
GFR calc Af Amer: 76 mL/min — ABNORMAL LOW (ref >90)
GFR calc non Af Amer: 65 mL/min — ABNORMAL LOW (ref >90)
Glucose, Bld: 110 mg/dL — ABNORMAL HIGH (ref 70–99)
Potassium: 3.5 mmol/L (ref 3.5–5.1)
Sodium: 138 mmol/L (ref 135–145)
TOTAL PROTEIN: 6.9 g/dL (ref 6.0–8.3)
Total Bilirubin: 0.6 mg/dL (ref 0.3–1.2)

## 2014-06-15 LAB — TYPE AND SCREEN
ABO/RH(D): O POS
ANTIBODY SCREEN: NEGATIVE

## 2014-06-15 LAB — CBC
HCT: 42.6 % (ref 36.0–46.0)
HCT: 42 % (ref 36.0–46.0)
HEMATOCRIT: 41.2 % (ref 36.0–46.0)
Hemoglobin: 14.2 g/dL (ref 12.0–15.0)
Hemoglobin: 13.9 g/dL (ref 12.0–15.0)
Hemoglobin: 13.3 g/dL (ref 12.0–15.0)
MCH: 31.3 pg (ref 26.0–34.0)
MCH: 31.6 pg (ref 26.0–34.0)
MCH: 30 pg (ref 26.0–34.0)
MCHC: 33.3 g/dL (ref 30.0–36.0)
MCHC: 33.7 g/dL (ref 30.0–36.0)
MCHC: 31.7 g/dL (ref 30.0–36.0)
MCV: 93.8 fL (ref 78.0–100.0)
MCV: 93.6 fL (ref 78.0–100.0)
MCV: 94.8 fL (ref 78.0–100.0)
PLATELETS: 190 10*3/uL (ref 150–400)
Platelets: 219 10*3/uL (ref 150–400)
Platelets: 186 10*3/uL (ref 150–400)
RBC: 4.54 MIL/uL (ref 3.87–5.11)
RBC: 4.4 MIL/uL (ref 3.87–5.11)
RBC: 4.43 MIL/uL (ref 3.87–5.11)
RDW: 12.7 % (ref 11.5–15.5)
RDW: 12.6 % (ref 11.5–15.5)
RDW: 12.5 % (ref 11.5–15.5)
WBC: 7 10*3/uL (ref 4.0–10.5)
WBC: 7 10*3/uL (ref 4.0–10.5)
WBC: 5.1 10*3/uL (ref 4.0–10.5)

## 2014-06-15 LAB — LIPASE, BLOOD: Lipase: 38 U/L (ref 11–59)

## 2014-06-15 LAB — ABO/RH: ABO/RH(D): O POS

## 2014-06-15 MED ORDER — ONDANSETRON HCL 4 MG/2ML IJ SOLN: 4.0000 mg | Freq: Four times a day (QID) | INTRAMUSCULAR | Status: DC | PRN

## 2014-06-15 MED ORDER — SODIUM CHLORIDE 0.9 % IV SOLN
INTRAVENOUS | Status: DC
Start: 2014-06-15 — End: 2014-06-15
  Administered 2014-06-15: 05:00:00 via INTRAVENOUS

## 2014-06-15 MED ORDER — LOSARTAN POTASSIUM 50 MG PO TABS
100.0000 mg | ORAL_TABLET | Freq: Every day | ORAL | Status: DC
  Administered 2014-06-15: 100 mg via ORAL
  Filled 2014-06-15: qty 2

## 2014-06-15 MED ORDER — VITAMIN B-12 1000 MCG PO TABS
1000.0000 ug | ORAL_TABLET | Freq: Every day | ORAL | Status: DC
  Administered 2014-06-15: 1000 ug via ORAL
  Filled 2014-06-15: qty 1

## 2014-06-15 MED ORDER — DOCUSATE SODIUM 100 MG PO CAPS: 100.0000 mg | ORAL_CAPSULE | Freq: Two times a day (BID) | ORAL | Status: DC

## 2014-06-15 MED ORDER — ACETAMINOPHEN 325 MG PO TABS: 650.0000 mg | ORAL_TABLET | Freq: Four times a day (QID) | ORAL | Status: DC | PRN

## 2014-06-15 MED ORDER — MEMANTINE HCL ER 7 MG PO CP24
21.0000 mg | ORAL_CAPSULE | Freq: Every day | ORAL | Status: DC
  Administered 2014-06-15: 21 mg via ORAL
  Filled 2014-06-15: qty 3

## 2014-06-15 MED ORDER — SERTRALINE HCL 100 MG PO TABS: 100.0000 mg | ORAL_TABLET | Freq: Every day | ORAL | Status: DC

## 2014-06-15 MED ORDER — ACETAMINOPHEN 650 MG RE SUPP: 650.0000 mg | Freq: Four times a day (QID) | RECTAL | Status: DC | PRN

## 2014-06-15 MED ORDER — HYDROCORTISONE ACETATE 25 MG RE SUPP: 25.0000 mg | Freq: Two times a day (BID) | RECTAL | Status: DC

## 2014-06-15 MED ORDER — ONDANSETRON HCL 4 MG PO TABS: 4.0000 mg | ORAL_TABLET | Freq: Four times a day (QID) | ORAL | Status: DC | PRN

## 2014-06-15 MED ORDER — BOOST / RESOURCE BREEZE PO LIQD
1.0000 | Freq: Three times a day (TID) | ORAL | Status: DC
  Administered 2014-06-15: 1 via ORAL

## 2014-06-15 NOTE — H&P (Signed)
[facility] History and Physical  Kimberly Acevedo IWL:[id number] DOB: 1929/02/24 DOA: 06/15/2014  Referring physician: ER physician. PCP: [name], MD   Chief Complaint: Rectal bleeding.  HPI: Kimberly Acevedo is a 81 y.o. female with history of hypertension, dementia, GERD who was admitted recently 3 weeks ago for diverticulitis and prior to that for rectal bleeding presents to the ER because of large bowel movement which was frankly bloody as per patient's caregiver. Patient's caregiver stated that last evening patient had a large bloody bowel movement. Patient had some vague abdominal pain in the lower quadrant. Denies any nausea vomiting fever chills. Patient is not on any blood thinners. Patient has been admitted for further observation.   Review of Systems: As presented in the history of presenting illness, rest negative.  Past Medical History  Diagnosis Date  . Fibromyalgia   . Dyslipidemia   . History of chest pain   . Achalasia, esophageal     history of  . History of dizziness   . History of fatigue   . Hypertension   . Hemorrhoids   . Arthritis   . Diverticulitis   . Kidney problem     spot on kidney, pt  says being elevated in April 2013  . Acid reflux   . Sleep apnea     uses a cpap  . Radiation 08/10/12-09/08/2012    Left breast 750 cGy in 3 sessions  . Dementia    Past Surgical History  Procedure Laterality Date  . Rectocele repair    . Total abdominal hysterectomy    . Esophageal dilation    . Cholecystectomy  2007  . Colonoscopy    . Breast lumpectomy with needle localization  07/04/2012    Procedure: BREAST LUMPECTOMY WITH NEEDLE LOCALIZATION;  Surgeon: Edward Jolly, MD;  Location: [place];  Service: General;  Laterality: Left;  left  . Varicose vein surgery  2010    left lower extremity   Social History:  reports that she has never smoked. She has never used smokeless tobacco. She reports that she does not drink  alcohol or use illicit drugs. Where does patient live at home. Can patient participate in ADLs? Not sure.  Allergies  Allergen Reactions  . Penicillins Swelling and Rash  . Statins   . Sulfa Drugs Cross Reactors Shortness Of Breath  . Tramadol Hcl Anaphylaxis  . Aspirin Swelling  . Codeine Other (See Comments)    unknown  . Morphine And Related Other (See Comments)    Hypoglycemia    Family History:  Family History  Problem Relation Age of Onset  . Cancer Mother 81    melanoma ca  . Arthritis Father   . Dementia Neg Hx       Prior to Admission medications   Medication Sig Start Date End Date Taking? Authorizing Provider  acetaminophen (TYLENOL) 500 MG tablet Take 500 mg by mouth every 6 (six) hours as needed. For arthritis   Yes Historical Provider, MD  Cholecalciferol (VITAMIN D) 2000 UNITS CAPS Take 1 capsule by mouth daily.   Yes Historical Provider, MD  feeding supplement, RESOURCE BREEZE, (RESOURCE BREEZE) LIQD Take 1 Container by mouth 3 (three) times daily between meals. 05/27/14  Yes Eugenie Filler, MD  hydrocortisone (ANUSOL-HC) 25 MG suppository Place 1 suppository (25 mg total) rectally 2 (two) times daily. For 7 days 05/22/14  Yes Mercedes Strupp Camprubi-Soms, PA-C  losartan-hydrochlorothiazide (HYZAAR) 100-25 MG per tablet Take 1 tablet by mouth  daily.  01/11/14  Yes Historical Provider, MD  NAMENDA XR 21 MG CP24 Take 1 capsule by mouth daily.  02/19/14  Yes Historical Provider, MD  PROCTOSOL HC 2.5 % rectal cream Place 1 application rectally 2 (two) times daily.  01/31/14  Yes Historical Provider, MD  sertraline (ZOLOFT) 100 MG tablet Take 100 mg by mouth at bedtime.    Yes Historical Provider, MD  vitamin B-12 (CYANOCOBALAMIN) 1000 MCG tablet Take 1,000 mcg by mouth daily.   Yes Historical Provider, MD  ciprofloxacin (CIPRO) 500 MG tablet Take 1 tablet (500 mg total) by mouth 2 (two) times daily. Take for 5 days then stop. Patient not taking: Reported on 06/15/2014  05/27/14   Eugenie Filler, MD  metroNIDAZOLE (FLAGYL) 500 MG tablet Take 1 tablet (500 mg total) by mouth every 8 (eight) hours. Take for 5 days then stop. Patient not taking: Reported on 06/15/2014 05/27/14   Eugenie Filler, MD  polyethylene glycol [facility] / Floria Raveling) packet Take 17 g by mouth daily as needed for mild constipation or moderate constipation. Use as directed until your bowel movements are soft and coming daily, then you may decrease to every other day to continue having daily soft BMs Patient not taking: Reported on 05/24/2014 05/22/14   Patty Sermons Camprubi-Soms, PA-C    Physical Exam: Filed Vitals:   06/15/14 0007  BP: 138/62  Pulse: 87  Temp: 98 F (36.7 C)  TempSrc: Oral  Resp: 20  Weight: 65.772 kg (145 lb)     General:  Well-developed and nourished.  Eyes: Anicteric no pallor.  ENT: No discharge from the ears eyes nose and mouth.  Neck: No mass felt.  Cardiovascular: S1 and S2 heard.  Respiratory: No rhonchi or crepitations.  Abdomen: Soft nontender bowel sounds present.  Skin: No rash.  Musculoskeletal: No edema.  Psychiatric: Patient presently is alert awake and oriented to place and name.  Neurologic: Alert awake and oriented to name and place. Moves all extremities.  Labs on Admission:  Basic Metabolic Panel:  Recent Labs Lab 06/15/14 0114  NA 138  K 3.5  CL 101  CO2 28  GLUCOSE 110*  BUN 20  CREATININE 0.81  CALCIUM 9.3   Liver Function Tests:  Recent Labs Lab 06/15/14 0114  AST 22  ALT 18  ALKPHOS 68  BILITOT 0.6  PROT 6.9  ALBUMIN 3.7    Recent Labs Lab 06/15/14 0114  LIPASE 38   No results for input(s): AMMONIA in the last 168 hours. CBC:  Recent Labs Lab 06/15/14 0114  WBC 8.2  NEUTROABS 3.5  HGB 14.7  HCT 42.8  MCV 93.0  PLT 206   Cardiac Enzymes: No results for input(s): CKTOTAL, CKMB, CKMBINDEX, TROPONINI in the last 168 hours.  BNP (last 3 results) No results for input(s): PROBNP in  the last 8760 hours. CBG: No results for input(s): GLUCAP in the last 168 hours.  Radiological Exams on Admission: No results found.   Assessment/Plan Active Problems:   Hypertension   Rectal bleeding   Dementia   1. Rectal bleeding - probably diverticular in origin. At this time patient will be kept on clear liquid diet and closely follow CBC. Patient is presently hemodynamically stable. 2. Hypertension - hold diuretics but continue Cozaar. 3. Dementia - continue present medications.   DVT Prophylaxis SCDs.  Code Status: Full code.  Family Communication: None.  Disposition Plan: Admit to inpatient.    Allysha Tryon N. [facility] Pager [phone].  If 7PM-7AM, please  contact night-coverage [email or web address] Password [email or web address] 06/15/2014, 4:30 AM

## 2014-06-15 NOTE — ED Notes (Signed)
Pt presents with bright red rectal bleeding onset yesterday with lower abd pain today. Denies n/v. Pt did have BM yesterday before bleeding began.

## 2014-06-15 NOTE — ED Provider Notes (Signed)
CSN: [id number]     Arrival date & time 06/15/14  0002 History   First MD Initiated Contact with Patient 06/15/14 0129     Chief Complaint  Patient presents with  . Rectal Bleeding     HPI Patient has a history of diverticulosis and presents with bright red blood per rectum 1 since last night.  Patient is taken care of at home by home health aides.  Her home health aide was with her tonight and reported that there were small clots and bright red blood per rectum and noted in the commode.  Patient denies significant abdominal pain.  She has a history of recurrent chronic abdominal pain but states no new or changing symptoms in her abdomen.  No fevers or chills.  Denies nausea vomiting.  Patient is not on any anticoagulants.   Past Medical History  Diagnosis Date  . Fibromyalgia   . Dyslipidemia   . History of chest pain   . Achalasia, esophageal     history of  . History of dizziness   . History of fatigue   . Hypertension   . Hemorrhoids   . Arthritis   . Diverticulitis   . Kidney problem     spot on kidney, pt  says being elevated in April 2013  . Acid reflux   . Sleep apnea     uses a cpap  . Radiation 08/10/12-09/08/2012    Left breast 750 cGy in 3 sessions  . Dementia    Past Surgical History  Procedure Laterality Date  . Rectocele repair    . Total abdominal hysterectomy    . Esophageal dilation    . Cholecystectomy  2007  . Colonoscopy    . Breast lumpectomy with needle localization  07/04/2012    Procedure: BREAST LUMPECTOMY WITH NEEDLE LOCALIZATION;  Surgeon: Edward Jolly, MD;  Location: [facility];  Service: General;  Laterality: Left;  left  . Varicose vein surgery  2010    left lower extremity   Family History  Problem Relation Age of Onset  . Cancer Mother 66    melanoma ca  . Arthritis Father   . Dementia Neg Hx    History  Substance Use Topics  . Smoking status: Never Smoker   . Smokeless tobacco: Never Used  . Alcohol Use: No    OB History    No data available     Review of Systems  All other systems reviewed and are negative.     Allergies  Penicillins; Statins; Sulfa drugs cross reactors; Tramadol hcl; Aspirin; Codeine; and Morphine and related  Home Medications   Prior to Admission medications   Medication Sig Start Date End Date Taking? Authorizing Provider  acetaminophen (TYLENOL) 500 MG tablet Take 500 mg by mouth every 6 (six) hours as needed. For arthritis   Yes Historical Provider, MD  Cholecalciferol (VITAMIN D) 2000 UNITS CAPS Take 1 capsule by mouth daily.   Yes Historical Provider, MD  feeding supplement, RESOURCE BREEZE, (RESOURCE BREEZE) LIQD Take 1 Container by mouth 3 (three) times daily between meals. 05/27/14  Yes Eugenie Filler, MD  hydrocortisone (ANUSOL-HC) 25 MG suppository Place 1 suppository (25 mg total) rectally 2 (two) times daily. For 7 days 05/22/14  Yes Mercedes Strupp Camprubi-Soms, PA-C  losartan-hydrochlorothiazide (HYZAAR) 100-25 MG per tablet Take 1 tablet by mouth daily.  01/11/14  Yes Historical Provider, MD  NAMENDA XR 21 MG CP24 Take 1 capsule by mouth daily.  02/19/14  Yes  Historical Provider, MD  PROCTOSOL HC 2.5 % rectal cream Place 1 application rectally 2 (two) times daily.  01/31/14  Yes Historical Provider, MD  sertraline (ZOLOFT) 100 MG tablet Take 100 mg by mouth at bedtime.    Yes Historical Provider, MD  vitamin B-12 (CYANOCOBALAMIN) 1000 MCG tablet Take 1,000 mcg by mouth daily.   Yes Historical Provider, MD  ciprofloxacin (CIPRO) 500 MG tablet Take 1 tablet (500 mg total) by mouth 2 (two) times daily. Take for 5 days then stop. Patient not taking: Reported on 06/15/2014 05/27/14   Eugenie Filler, MD  metroNIDAZOLE (FLAGYL) 500 MG tablet Take 1 tablet (500 mg total) by mouth every 8 (eight) hours. Take for 5 days then stop. Patient not taking: Reported on 06/15/2014 05/27/14   Eugenie Filler, MD  polyethylene glycol [facility] Floria Raveling) packet Take 17 g  by mouth daily as needed for mild constipation or moderate constipation. Use as directed until your bowel movements are soft and coming daily, then you may decrease to every other day to continue having daily soft BMs Patient not taking: Reported on 05/24/2014 05/22/14   Mercedes Strupp Camprubi-Soms, PA-C   BP 138/62 mmHg  Pulse 87  Temp(Src) 98 F (36.7 C) (Oral)  Resp 20  Wt 145 lb (65.772 kg) Physical Exam  Constitutional: She appears well-developed and well-nourished. No distress.  HENT:  Head: Normocephalic and atraumatic.  Eyes: EOM are normal.  Neck: Normal range of motion.  Cardiovascular: Normal rate, regular rhythm and normal heart sounds.   Pulmonary/Chest: Effort normal and breath sounds normal.  Abdominal: Soft. She exhibits no distension. There is no tenderness.  Genitourinary:  No stool noted on rectal exam.  Blood staining of her perianal area.  Gross blood on rectal exam with small clots noted.  No active bleeding coming from the rectum  Musculoskeletal: Normal range of motion.  Neurological: She is alert.  Oriented 2  Skin: Skin is warm and dry.  Psychiatric: She has a normal mood and affect. Judgment normal.  Nursing note and vitals reviewed.   ED Course  Procedures (including critical care time) Labs Review Labs Reviewed  COMPREHENSIVE METABOLIC PANEL - Abnormal; Notable for the following:    Glucose, Bld 110 (*)    GFR calc non Af Amer 65 (*)    GFR calc Af Amer 76 (*)    All other components within normal limits  CBC WITH DIFFERENTIAL - Abnormal; Notable for the following:    Lymphocytes Relative 47 (*)    All other components within normal limits  LIPASE, BLOOD    Imaging Review No results found.   EKG Interpretation None      MDM   Final diagnoses:  Lower GI bleed    Lower GI bleed with bright red blood on rectal exam and blood staining of the perianal area.  Patient will be admitted for serial CBCs.  Hemoglobin is stable.  Vitals are  stable.  No significant abdominal tenderness to suggest diverticulitis.  At this time I do not think she needs imaging of her abdomen.    Hoy Morn, MD 06/15/14 [phone]

## 2014-06-15 NOTE — Discharge Summary (Signed)
Physician Discharge Summary  Kimberly Acevedo CVU:[id number] DOB: 1928-09-08 DOA: 06/15/2014  PCP: [name], MD  Admit date: 06/15/2014 Discharge date: 06/15/2014  Time spent: 30 minutes  Recommendations for Outpatient Follow-up:  1. Follow up with PCP in 1-2 weeks  Discharge Diagnoses:  Active Problems:   Hypertension   Rectal bleeding   Dementia  Discharge Condition: Stable  Diet recommendation: High fiber  Filed Weights   06/15/14 0007 06/15/14 0504  Weight: 65.772 kg (145 lb) 72.576 kg (160 lb)    History of present illness:  Please see admit h and p from 06/15/14 for details. Briefly, pt presents with rectal bleeding in the setting of known hemorrhoids, previously followed by Dr. Paulita Fujita as an outpatient. Pt was admitted for further work up.  Hospital Course:  The patient was admitted to the floor. Hemoglobin remained stable. The patient remained hemodynamically stable. The patient was advised to stay on a high fiber diet with stool softeners.   On further review, pt had been followed by Dr. Paulita Fujita for diverticulosis around 2012. She was referred to [facility] for significant hemorrhoids where she opted for conservative tx over surgical management. Patient was admitted in 2014 for lower GI bleed but was lost to follow up with GI or General Surgery. Pt has since been recommended to follow up with both GI and Surgery on discharge. Family reports that pt has an appointment with GI on 06/17/14 at 2pm. Pt is stable for discharge today.  Procedures:  none  Consultations:  none  Discharge Exam: Filed Vitals:   06/15/14 0007 06/15/14 0504  BP: 138/62 126/63  Pulse: 87 71  Temp: 98 F (36.7 C) 98 F (36.7 C)  TempSrc: Oral Oral  Resp: 20 18  Height:  5\' 8"  (1.727 m)  Weight: 65.772 kg (145 lb) 72.576 kg (160 lb)  SpO2:  92%    General: Awake, in nad Cardiovascular: regular, s1, s2 Respiratory: normal resp effort, no wheezing  Discharge  Instructions     Medication List    STOP taking these medications        ciprofloxacin 500 MG tablet  Commonly known as:  CIPRO     metroNIDAZOLE 500 MG tablet  Commonly known as:  FLAGYL      TAKE these medications        acetaminophen 500 MG tablet  Commonly known as:  TYLENOL  Take 500 mg by mouth every 6 (six) hours as needed. For arthritis     docusate sodium 100 MG capsule  Commonly known as:  COLACE  Take 1 capsule (100 mg total) by mouth 2 (two) times daily.     feeding supplement (RESOURCE BREEZE) Liqd  Take 1 Container by mouth 3 (three) times daily between meals.     hydrocortisone 25 MG suppository  Commonly known as:  ANUSOL-HC  Place 1 suppository (25 mg total) rectally 2 (two) times daily. For 7 days     losartan-hydrochlorothiazide 100-25 MG per tablet  Commonly known as:  HYZAAR  Take 1 tablet by mouth daily.     NAMENDA XR 21 MG Cp24  Generic drug:  Memantine HCl ER  Take 1 capsule by mouth daily.     polyethylene glycol packet  Commonly known as:  MIRALAX / GLYCOLAX  Take 17 g by mouth daily as needed for mild constipation or moderate constipation. Use as directed until your bowel movements are soft and coming daily, then you may decrease to every other day to continue having  daily soft BMs     PROCTOSOL HC 2.5 % rectal cream  Generic drug:  hydrocortisone  Place 1 application rectally 2 (two) times daily.     sertraline 100 MG tablet  Commonly known as:  ZOLOFT  Take 100 mg by mouth at bedtime.     vitamin B-12 1000 MCG tablet  Commonly known as:  CYANOCOBALAMIN  Take 1,000 mcg by mouth daily.     Vitamin D 2000 UNITS Caps  Take 1 capsule by mouth daily.       Allergies  Allergen Reactions  . Penicillins Swelling and Rash  . Statins   . Sulfa Drugs Cross Reactors Shortness Of Breath  . Tramadol Hcl Anaphylaxis  . Aspirin Swelling  . Codeine Other (See Comments)    unknown  . Morphine And Related Other (See Comments)     Hypoglycemia   Follow-up Information    Follow up with [name], MD. Schedule an appointment as soon as possible for a visit in 1 week.   Specialty:  Internal Medicine   Contact information:   [place] [address] [phone]       Follow up with Landry Dyke, MD. Schedule an appointment as soon as possible for a visit in 1 week.   Specialty:  Gastroenterology   Contact information:   [address] [place] [address] [phone]       Follow up with [facility]. Schedule an appointment as soon as possible for a visit in 1 week.   Contact information:   [place] [address] [phone]       The results of significant diagnostics from this hospitalization (including imaging, microbiology, ancillary and laboratory) are listed below for reference.    Significant Diagnostic Studies: Ct Abdomen Pelvis W Contrast  05/24/2014   CLINICAL DATA:  Low abdominal pain with rectal bleeding. History of inflammatory bowel syndrome.  EXAM: CT ABDOMEN AND PELVIS WITH CONTRAST  TECHNIQUE: Multidetector CT imaging of the abdomen and pelvis was performed using the standard protocol following bolus administration of intravenous contrast.  CONTRAST:  178mL OMNIPAQUE IOHEXOL 300 MG/ML SOLN, 50mL OMNIPAQUE IOHEXOL 300 MG/ML SOLN  COMPARISON:  08/20/2011  FINDINGS: BODY WALL: Unremarkable.  LOWER CHEST: Mild scarring RIGHT middle lobe. Mild cardiomegaly. Hiatal hernia.  ABDOMEN/PELVIS:  Liver: No focal abnormality. Lateral segment LEFT lobe extends into LEFT upper quadrant.  Biliary: No evidence of biliary obstruction or stone. Cholecystectomy.  Pancreas: Unremarkable.  Spleen: Unremarkable.  Adrenals: Unremarkable.  Kidneys and ureters: No hydronephrosis or stone. Continued enlargement of upper pole medially projecting LEFT renal lesion, 24 x 24 x 22 mm having 31 Hounsfield unit  attenuation.  Bladder: Unremarkable.  Reproductive: Unremarkable.  Status post hysterectomy.  Bowel: No obstruction. No appendiceal inflammation. Extensive colonic diverticulosis with bowel wall thickening in the descending and proximal sigmoid segments consistent with diverticulitis. No surrounding abscess or free perforation.  Retroperitoneum: No mass or adenopathy.  Peritoneum: No free fluid or gas.  Vascular: No acute abnormality.  Atherosclerosis.  OSSEOUS: No acute abnormalities.  IMPRESSION: Diverticulitis of the descending and proximal sigmoid colon with generalized diverticulosis. This appearance is similar to priors.  Progressive enlargement of the medial projecting LEFT upper pole lesion. Further evaluation with outpatient pre and post-contrast abdominal MRI or CT is recommended.   Electronically Signed   By: Rolla Flatten M.D.   On: 05/24/2014 19:03    Microbiology: No results found for  this or any previous visit (from the past 240 hour(s)).   Labs: Basic Metabolic Panel:  Recent Labs Lab 06/15/14 0114 06/15/14 0515  NA 138 140  K 3.5 3.6  CL 101 101  CO2 28 29  GLUCOSE 110* 108*  BUN 20 18  CREATININE 0.81 0.77  CALCIUM 9.3 9.2   Liver Function Tests:  Recent Labs Lab 06/15/14 0114  AST 22  ALT 18  ALKPHOS 68  BILITOT 0.6  PROT 6.9  ALBUMIN 3.7    Recent Labs Lab 06/15/14 0114  LIPASE 38   No results for input(s): AMMONIA in the last 168 hours. CBC:  Recent Labs Lab 06/15/14 0114 06/15/14 0515 06/15/14 0823 06/15/14 1218  WBC 8.2 7.0 7.0 5.1  NEUTROABS 3.5  --   --   --   HGB 14.7 14.2 13.9 13.3  HCT 42.8 42.6 41.2 42.0  MCV 93.0 93.8 93.6 94.8  PLT 206 219 186 190   Cardiac Enzymes: No results for input(s): CKTOTAL, CKMB, CKMBINDEX, TROPONINI in the last 168 hours. BNP: BNP (last 3 results) No results for input(s): PROBNP in the last 8760 hours. CBG: No results for input(s): GLUCAP in the last 168 hours.  Signed:  Daira Hine, Orpah Melter  [facility] 06/15/2014, 1:36 PM

## 2014-06-17 ENCOUNTER — Ambulatory Visit (INDEPENDENT_AMBULATORY_CARE_PROVIDER_SITE_OTHER): Payer: Medicare Other | Admitting: Nurse Practitioner

## 2014-06-17 ENCOUNTER — Telehealth: Payer: Self-pay | Admitting: *Deleted

## 2014-06-17 ENCOUNTER — Encounter: Payer: Self-pay | Admitting: Nurse Practitioner

## 2014-06-17 VITALS — BP 126/70 | HR 100 | Ht 68.0 in | Wt 159.5 lb

## 2014-06-17 DIAGNOSIS — K602 Anal fissure, unspecified: Secondary | ICD-10-CM | POA: Insufficient documentation

## 2014-06-17 DIAGNOSIS — K6289 Other specified diseases of anus and rectum: Secondary | ICD-10-CM

## 2014-06-17 DIAGNOSIS — K5733 Diverticulitis of large intestine without perforation or abscess with bleeding: Secondary | ICD-10-CM

## 2014-06-17 DIAGNOSIS — K649 Unspecified hemorrhoids: Secondary | ICD-10-CM

## 2014-06-17 MED ORDER — CIPROFLOXACIN HCL 500 MG PO TABS: 500.0000 mg | ORAL_TABLET | Freq: Two times a day (BID) | ORAL | Status: DC

## 2014-06-17 MED ORDER — DILTIAZEM GEL 2 %: 1.0000 "application " | Freq: Three times a day (TID) | CUTANEOUS | Status: DC

## 2014-06-17 MED ORDER — METRONIDAZOLE 500 MG PO TABS: 500.0000 mg | ORAL_TABLET | Freq: Two times a day (BID) | ORAL | Status: DC

## 2014-06-17 MED ORDER — HYDROCORTISONE ACETATE 25 MG RE SUPP: 25.0000 mg | Freq: Two times a day (BID) | RECTAL | Status: DC

## 2014-06-17 NOTE — Patient Instructions (Signed)
We sent prescriptions to [facility]. 1. Cipro 500 mg 2. Flagyl 500 mg 3. Anusol HC suppositories.  We called  a prescription to [facility] for Diltiazem Gel 2 %.

## 2014-06-17 NOTE — Telephone Encounter (Signed)
I called a Software engineer, at [facility], [facility].  I called in a prescription for Diltiazem Gel 2 %, 30 g tube, with 1 refill.  They have some made up,  She will fill this for the patient.  I gave the pharmacist her name, DOB, address and [employer] RX BIN and other RX information.

## 2014-06-17 NOTE — Progress Notes (Addendum)
History of Present Illness:  Patient is an 81 year old female, new to this practice, referred by [facility]. Patient has dementia, history may not be reliable. Patient accompanied by her caregiver. Patient was followed years ago by Dr. Jim Acevedo. Based on recent hospital discharge patient may have been seen by [facility] a few years back.   Patient was evaluated in ED a couple of times between November - December for rectal bleeding and abdominal pain. She was heme negative, Hgb at baseline in 14 range. Patient was prescribed Miralax and steroid suppositories. Patient went back to ED (3rd time) in mid December, that time with lower abdominal pain and vomiting. White count was mildly elevated, lipase 73.  CT scan compatible with left sided diverticulitis. Patient recieved 3 days of IV Cipro / Flagyl and was discharged home 12/21 with 5 additional days of antibiotics.  Patient went readmitted 06/15/14 with recurrent rectal bleeding felt to be hemorrhoidal in nature. Hbg stable mid 13 range. Patient was advised to follow up with GI and also make appointment with surgery for evaluation of hemorrhoids.   Patient complains of lower abdominal soreness, caregiver states she complains of actual pain at home. She continues to have intermittent rectal bleeding. Caregiver reports hard stools and complaints of pain with defecation. Stools alternate between being firm and loose on daily Miralax. Appetite is okay.  Current Medications, Allergies, Past Medical History, Past Surgical History, Family History and Social History were reviewed in [employer] record.  Studies:   Ct Abdomen Pelvis W Contrast  05/24/2014   CLINICAL DATA:  Low abdominal pain with rectal bleeding. History of inflammatory bowel syndrome.  EXAM: CT ABDOMEN AND PELVIS WITH CONTRAST  TECHNIQUE: Multidetector CT imaging of the abdomen and pelvis was performed using the standard protocol following bolus  administration of intravenous contrast.  CONTRAST:  127mL OMNIPAQUE IOHEXOL 300 MG/ML SOLN, 51mL OMNIPAQUE IOHEXOL 300 MG/ML SOLN  COMPARISON:  08/20/2011  FINDINGS: BODY WALL: Unremarkable.  LOWER CHEST: Mild scarring RIGHT middle lobe. Mild cardiomegaly. Hiatal hernia.  ABDOMEN/PELVIS:  Liver: No focal abnormality. Lateral segment LEFT lobe extends into LEFT upper quadrant.  Biliary: No evidence of biliary obstruction or stone. Cholecystectomy.  Pancreas: Unremarkable.  Spleen: Unremarkable.  Adrenals: Unremarkable.  Kidneys and ureters: No hydronephrosis or stone. Continued enlargement of upper pole medially projecting LEFT renal lesion, 24 x 24 x 22 mm having 31 Hounsfield unit attenuation.  Bladder: Unremarkable.  Reproductive: Unremarkable.  Status post hysterectomy.  Bowel: No obstruction. No appendiceal inflammation. Extensive colonic diverticulosis with bowel wall thickening in the descending and proximal sigmoid segments consistent with diverticulitis. No surrounding abscess or free perforation.  Retroperitoneum: No mass or adenopathy.  Peritoneum: No free fluid or gas.  Vascular: No acute abnormality.  Atherosclerosis.  OSSEOUS: No acute abnormalities.  IMPRESSION: Diverticulitis of the descending and proximal sigmoid colon with generalized diverticulosis. This appearance is similar to priors.  Progressive enlargement of the medial projecting LEFT upper pole lesion. Further evaluation with outpatient pre and post-contrast abdominal MRI or CT is recommended.   Electronically Signed   By: Kimberly Acevedo M.D.   On: 05/24/2014 19:03    Physical Exam: General: Pleasant, well developed , white female in no acute distress Head: Normocephalic and atraumatic Eyes:  sclerae anicteric, conjunctiva pink  Ears: Normal auditory acuity Lungs: Clear throughout to auscultation Heart: Regular rate and rhythm Abdomen: Soft, mildly distended, mild LLQ tenderness.  No masses, no hepatomegaly. Normal bowel  sounds Rectal: small  protruding hemorrhoids. No rectal masses felt. Painful exam. No obvious fissures but suboptimal exam secondary to discomfort.  Musculoskeletal: Symmetrical with no gross deformities  Extremities: No edema  Neurological: Alert oriented x 4, grossly nonfocal Psychological:  Alert and cooperative. Normal mood and affect  Assessment and Recommendations:  40. 82 year old female recently hospitalized with left-sided diverticulitis, she had mild left-sided diverticulitis in 2013 as well. Pain unresolved after 7-8 days of antibiotics. Abdominal exam is not overly concerning. Will extend course of Cipro and Flagyl by one more week. If pain persists she may need repeat CT scan.  2. Rectal bleeding / pain with defecation. Patient has hemorrhoids on exam but anal fissure not excluded.   Will treat empirically for anal fissure with diltiazem gel 3 times daily for 4 weeks as directed (explained to caregiver).   Will treat hemorrhoids with steroid suppositories nightly times 7 days.   Cautioned against straining.   Continue daily MiraLAX. She may add a daily stool softener if needed.   I will see patient back in 3-4 weeks, or sooner if need be. In the interim will contact [facility] for records.   Doubtful patient has had a recent colonoscopy which we will need to consider depending on clinical course. This wouldn't be done for several weeks given documented diverticulitis.  She may be a candidate for hemorrhoidal banding   3. Mildly elevated lipase (73), double clinically significant. Normal appearing pancreas on contrast CTscan.   4. Enlarging left renal lesion on CTscan. Will defer further workup to PCP.   5. Dementia.    Addendum: I located a colonoscopy report done by Dr. Lajoyce Acevedo June 2010 for history of colon polyps. Extent of the exam was to the cecum. Internal hemorrhoids were found. 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 Diverticula scattered  throughout the colon

## 2014-06-18 ENCOUNTER — Telehealth: Payer: Self-pay | Admitting: Hematology

## 2014-06-18 ENCOUNTER — Encounter: Payer: Self-pay | Admitting: Hematology

## 2014-06-18 ENCOUNTER — Ambulatory Visit (HOSPITAL_BASED_OUTPATIENT_CLINIC_OR_DEPARTMENT_OTHER): Payer: Medicare Other | Admitting: Hematology

## 2014-06-18 VITALS — BP 159/59 | HR 74 | Temp 97.6°F | Resp 18 | Ht 68.0 in | Wt 162.3 lb

## 2014-06-18 DIAGNOSIS — C50912 Malignant neoplasm of unspecified site of left female breast: Secondary | ICD-10-CM

## 2014-06-18 DIAGNOSIS — Z853 Personal history of malignant neoplasm of breast: Secondary | ICD-10-CM

## 2014-06-18 DIAGNOSIS — F0390 Unspecified dementia without behavioral disturbance: Secondary | ICD-10-CM

## 2014-06-18 DIAGNOSIS — I10 Essential (primary) hypertension: Secondary | ICD-10-CM

## 2014-06-18 DIAGNOSIS — D72829 Elevated white blood cell count, unspecified: Secondary | ICD-10-CM

## 2014-06-18 NOTE — Telephone Encounter (Signed)
gv adn rpinted July appt...no pof sent

## 2014-06-18 NOTE — Progress Notes (Signed)
OFFICE PROGRESS NOTE  CC: follow up breast cancer   [name], MD [place] [address]  DIAGNOSIS: 81 year old female with stage I invasive lobular carcinoma of the left breast in addition to microinvasive ductal carcinoma and DCIS.   PRIOR THERAPY: 1. Patient diagnosed during screening mammo on 05/19/12 finding possible mass within left breast.  Ultrasound shoed a 68mm mass along the upper inner left breast.  The mass measured 4 x 5 x 7 mm at 11:00 8cm from the nipple, no abnormal lymph nodes.  Needle core biopsy on 06/15/12 showed DCIS ER/PR positive.    2. She underwent left partial mastectomy on 07/04/12.  She had a 0.2cm invasive lobular carcinoma along with LCIS. Also noted low grade DCIS 0.7cm with microinvasive grade 1 ductal carcinoma.  The invasive carcinoma was less than 0.1cm from the main inferior margin.  Tumor was ER PR positive.    3. Patient underwent radiation therapy with Dr. Valere Dross from 08/10/12 through 09/08/12.  CURRENT THERAPY: observation  INTERVAL HISTORY: Kimberly Acevedo 81 y.o. female returns for follow up for her breast cancer. She has dementia and is a poor historian. She came in today with her home aids. She had rectal bleeding and was admitted to [facility] on 06/15/2014. It felt was related to her hemorrhoids and diverticulitis. She was seen by GI clinic yesterday and are on antibiotics and other medical treatment now. She also complains of left breast pain, but can not tell how long it has been going on. She has dementia. Her home aids states it's been going on for more than 6 month.  She otherwise is doing well, no other complains.   MEDICAL HISTORY: Past Medical History  Diagnosis Date  . Fibromyalgia   . Dyslipidemia   . History of chest pain   . Achalasia, esophageal     history of  . Hypertension   . Hemorrhoids   . Arthritis   . Diverticulitis   . Kidney problem     spot on kidney, pt  says being elevated in April  2013  . Acid reflux   . Sleep apnea     uses a cpap  . Radiation 08/10/12-09/08/2012    Left breast 750 cGy in 3 sessions  . Dementia     Early stages    ALLERGIES:  is allergic to penicillins; statins; sulfa drugs cross reactors; tramadol hcl; aspirin; codeine; and morphine and related.  MEDICATIONS:  Current Outpatient Prescriptions  Medication Sig Dispense Refill  . acetaminophen (TYLENOL) 500 MG tablet Take 500 mg by mouth every 6 (six) hours as needed. For arthritis    . Cholecalciferol (VITAMIN D) 2000 UNITS CAPS Take 1 capsule by mouth daily.    . ciprofloxacin (CIPRO) 500 MG tablet Take 1 tablet (500 mg total) by mouth 2 (two) times daily. 14 tablet 0  . diltiazem 2 % GEL Apply 1 application topically 3 (three) times daily. 30 g 1  . docusate sodium (COLACE) 100 MG capsule Take 1 capsule (100 mg total) by mouth 2 (two) times daily. 30 capsule 0  . feeding supplement, RESOURCE BREEZE, (RESOURCE BREEZE) LIQD Take 1 Container by mouth 3 (three) times daily between meals.  0  . hydrocortisone (ANUSOL-HC) 25 MG suppository Place 1 suppository (25 mg total) rectally every 12 (twelve) hours. 7 suppository 0  . losartan-hydrochlorothiazide (HYZAAR) 100-25 MG per tablet Take 1 tablet by mouth daily.     . metroNIDAZOLE (FLAGYL) 500 MG tablet  Take 1 tablet (500 mg total) by mouth 2 (two) times daily. 21 tablet 0  . NAMENDA XR 21 MG CP24 Take 1 capsule by mouth daily.     . polyethylene glycol (MIRALAX / GLYCOLAX) packet Take 17 g by mouth daily as needed for mild constipation or moderate constipation. Use as directed until your bowel movements are soft and coming daily, then you may decrease to every other day to continue having daily soft BMs 14 each 0  . PROCTOSOL HC 2.5 % rectal cream Place 1 application rectally 2 (two) times daily.     . sertraline (ZOLOFT) 100 MG tablet Take 100 mg by mouth at bedtime.     . vitamin B-12 (CYANOCOBALAMIN) 1000 MCG tablet Take 1,000 mcg by mouth daily.      No current facility-administered medications for this visit.    SURGICAL HISTORY:  Past Surgical History  Procedure Laterality Date  . Rectocele repair    . Total abdominal hysterectomy    . Esophageal dilation    . Cholecystectomy  2007  . Colonoscopy    . Breast lumpectomy with needle localization  07/04/2012    Procedure: BREAST LUMPECTOMY WITH NEEDLE LOCALIZATION;  Surgeon: Edward Jolly, MD;  Location: [place];  Service: General;  Laterality: Left;  left  . Varicose vein surgery  2010    left lower extremity    REVIEW OF SYSTEMS:  General: fatigue (-), night sweats (-), fever (-), pain (+) Lymph: palpable nodes (-) HEENT: vision changes (-), mucositis (-), gum bleeding (-), epistaxis (-) Cardiovascular: chest pain (-), palpitations (-) Pulmonary: shortness of breath (-), dyspnea on exertion (-), cough (-), hemoptysis (-) GI:  Early satiety (-), melena (-), dysphagia (-), nausea/vomiting (-), diarrhea (-) GU: dysuria (-), hematuria (-), incontinence (-) Musculoskeletal: joint swelling (-), joint pain (-), back pain (-) Neuro: weakness (-), numbness (-), headache (-), confusion (-) Skin: Rash (-), lesions (-), dryness (-) Psych: depression (-), suicidal/homicidal ideation (-), feeling of hopelessness (-)   HEALTH MAINTENANCE:  Mammogram 06/15/2012 Colonoscopy 2010  Bone Density unknown results, March 2014 Pap Smear s/p [facility] Eye Exam 4/13 Vitamin D unknown Lipid Panel 08/2011  PHYSICAL EXAMINATION: Blood pressure 159/59, pulse 74, temperature 97.6 F (36.4 C), temperature source Oral, resp. rate 18, height 5\' 8"  (1.727 m), weight 162 lb 4.8 oz (73.619 kg), SpO2 99 %. Body mass index is 24.68 kg/(m^2). General: Patient is a well appearing female in no acute distress HEENT: PERRLA, sclerae anicteric no conjunctival pallor, MMM Neck: supple, no palpable adenopathy Lungs: clear to auscultation bilaterally, no wheezes, rhonchi, or  rales Cardiovascular: regular rate rhythm, S1, S2, no murmurs, rubs or gallops Abdomen: Soft, non-tender, non-distended, normoactive bowel sounds, no HSM Extremities: warm and well perfused, no clubbing, cyanosis, or edema Skin: No rashes or lesions Neuro: Non-focal Breasts: right breast, no masses or nodularity.  Left breast exam showed a well healed scar at upper middle, no skin change, nipple discharge or palpable masses, nodularity or sign of recurrence.  ECOG PERFORMANCE STATUS: 2     LABORATORY DATA: Lab Results  Component Value Date   WBC 5.1 06/15/2014   HGB 13.3 06/15/2014   HCT 42.0 06/15/2014   MCV 94.8 06/15/2014   PLT 190 06/15/2014      Chemistry      Component Value Date/Time   NA 140 06/15/2014 0515   NA 143 11/22/2012 1316   K 3.6 06/15/2014 0515   K 3.7 11/22/2012 1316   CL  101 06/15/2014 0515   CL 103 11/22/2012 1316   CO2 29 06/15/2014 0515   CO2 29 11/22/2012 1316   BUN 18 06/15/2014 0515   BUN 8.7 11/22/2012 1316   CREATININE 0.77 06/15/2014 0515   CREATININE 0.8 11/22/2012 1316      Component Value Date/Time   CALCIUM 9.2 06/15/2014 0515   CALCIUM 10.3 11/22/2012 1316   ALKPHOS 68 06/15/2014 0114   ALKPHOS 100 11/22/2012 1316   AST 22 06/15/2014 0114   AST 17 11/22/2012 1316   ALT 18 06/15/2014 0114   ALT 15 11/22/2012 1316   BILITOT 0.6 06/15/2014 0114   BILITOT 0.39 11/22/2012 1316       RADIOGRAPHIC STUDIES:  No results found.  ASSESSMENT: 81 year old female with  #1 new diagnosis of stage I (T1 A. NX M0) invasive lobular/LCIS and in addition to microinvasive ductal carcinoma/DCIS.s/p lumpectomy and adjuvant radiation, on observation now.    PLAN:  1. Stage I breast cancer  -she is doing well clinically, no sign of recurrence  -continue mammogram yearly, she has one scheduled for the Friday -Continue calcium and vitamin D for bone health.   2. Low GI bleeding from Hemorrhoid and diverticulitis  -follow up with GI   3.  Dementia, HTN, RA -follow up with PCP   Plan: -you are scheduled for mammogram this Friday, I will follow the result  -RTC in 6 month    All questions were answered. The patient knows to call the clinic with any problems, questions or concerns. We can certainly see the patient much sooner if necessary.  I spent 15 minutes counseling the patient face to face. The total time spent in the appointment was 20 minutes.   Truitt Merle 06/18/2014

## 2014-06-18 NOTE — Progress Notes (Signed)
I agree with the above note, plan 

## 2014-06-21 ENCOUNTER — Ambulatory Visit
Admission: RE | Admit: 2014-06-21 | Discharge: 2014-06-21 | Disposition: A | Discharge status: Home / Self Care | Payer: Medicare Other | Source: Ambulatory Visit | Attending: Internal Medicine | Admitting: Internal Medicine

## 2014-06-21 DIAGNOSIS — N644 Mastodynia: Secondary | ICD-10-CM

## 2014-07-04 ENCOUNTER — Telehealth: Payer: Self-pay | Admitting: Nurse Practitioner

## 2014-07-04 NOTE — Telephone Encounter (Signed)
Ok to schedule afternoon for her.  Thanks

## 2014-07-15 ENCOUNTER — Encounter: Payer: Self-pay | Admitting: Nurse Practitioner

## 2014-07-15 ENCOUNTER — Ambulatory Visit (INDEPENDENT_AMBULATORY_CARE_PROVIDER_SITE_OTHER): Payer: Medicare Other | Admitting: Nurse Practitioner

## 2014-07-15 VITALS — BP 122/68 | HR 108 | Ht 67.0 in | Wt 159.5 lb

## 2014-07-15 DIAGNOSIS — K648 Other hemorrhoids: Secondary | ICD-10-CM

## 2014-07-15 DIAGNOSIS — K625 Hemorrhage of anus and rectum: Secondary | ICD-10-CM

## 2014-07-15 NOTE — Patient Instructions (Signed)
Continue the glycerin suppositories and try not to strain when using the bathroom.   Dr. Carlean Purl will take a look at your chart and we will call you about possible hemorrhoid banding

## 2014-07-16 ENCOUNTER — Telehealth: Payer: Self-pay | Admitting: *Deleted

## 2014-07-16 ENCOUNTER — Encounter: Payer: Self-pay | Admitting: Nurse Practitioner

## 2014-07-16 DIAGNOSIS — K648 Other hemorrhoids: Secondary | ICD-10-CM | POA: Insufficient documentation

## 2014-07-16 NOTE — Progress Notes (Signed)
     History of Present Illness:   Patient is an 81 year old female who I saw several weeks ago in the office after being hospitalized with diverticulitis. She completed antibiotics, abdominal pain is resolved. At that time it for visit patient and her caregiver complaint of rectal bleeding and discomfort with defecation. Internal hemorrhoids were seen on anoscopy but fissure was not apparent. Patient was treated empirically for an anal fissure with diltiazem gel. Hemorrhoids were treated with steroid suppositories for 7 days. Stool softeners and MiraLAX were recommended. Patient comes in today for scheduled follow-up. No abdominal pain. No further rectal bleeding. She continues to have rectal pain and pressure from what sounds like a protruding hemorrhoid. Her bowels are moving well, not requiring MiraLAX on a daily basis. She is using what sounds like glycerin suppositories. .   Current Medications, Allergies, Past Medical History, Past Surgical History, Family History and Social History were reviewed in [employer] record.  Physical Exam: General: Pleasant, well developed , white female in no acute distress Head: Normocephalic and atraumatic Eyes:  sclerae anicteric, conjunctiva pink  Ears: Normal auditory acuity Lungs: Clear throughout to auscultation Heart: Regular rate and rhythm Abdomen: Soft, non distended, non-tender. No masses, no hepatomegaly. Normal bowel sounds Rectal: Externally there were some old hemorrhoidal tags. On anoscopy patient still has inflamed hemorrhoids, currently not protruding.  Musculoskeletal: Symmetrical with no gross deformities  Extremities: No edema  Neurological: Alert oriented x 4, grossly nonfocal Psychological:  Alert and cooperative. Normal mood and affect  Assessment and Recommendations:  82 year old female with internal hemorrhoids. Bleeding resolved with topical steroids but still has discomfort related to intermittent  protrusion of internal hemorrhoid. I do not appreciate any fissures on exam but internal hemorrhoid hasn't really improved with topical steroids. We spoke about banding, she is interested. I will arrange for her to come in to see Dr. Carlean Purl who does banding in our office. In the interim, avoid straining and keep bowels soft.

## 2014-07-16 NOTE — Progress Notes (Signed)
i agree with the above note, plan 

## 2014-07-16 NOTE — Telephone Encounter (Signed)
I called and spoke to Kimberly Acevedo, at [phone].  I told Kimberly Acevedo that per Tye Savoy NP,  She does not need anything for pain due to it causing constipation.  I advised we are working on setting her mother up for a hemorrhoidal banding.  Kimberly Acevedo understood and thanked me for calling.

## 2014-07-25 NOTE — Progress Notes (Signed)
Agree with banding appointment. Will have my RN call and schedule for one.

## 2014-07-26 ENCOUNTER — Telehealth: Payer: Self-pay | Admitting: Nurse Practitioner

## 2014-07-26 NOTE — Progress Notes (Signed)
I contacted the patient and she declined to schedule hemorrhoid banding appt.

## 2014-07-26 NOTE — Telephone Encounter (Signed)
I spoke with the patient and she declined the appt this am.  Daughter asked that I schedule the appt with her and she will speak with her mother.  She is scheduled for 09/05/14

## 2014-08-16 ENCOUNTER — Encounter: Payer: Self-pay | Admitting: *Deleted

## 2014-08-16 ENCOUNTER — Ambulatory Visit (INDEPENDENT_AMBULATORY_CARE_PROVIDER_SITE_OTHER): Payer: Medicare Other | Admitting: Nurse Practitioner

## 2014-08-16 ENCOUNTER — Telehealth: Payer: Self-pay | Admitting: Internal Medicine

## 2014-08-16 ENCOUNTER — Telehealth: Payer: Self-pay | Admitting: Hematology

## 2014-08-16 VITALS — BP 120/80 | HR 66 | Ht 66.0 in | Wt 162.6 lb

## 2014-08-16 DIAGNOSIS — K6289 Other specified diseases of anus and rectum: Secondary | ICD-10-CM

## 2014-08-16 DIAGNOSIS — K625 Hemorrhage of anus and rectum: Secondary | ICD-10-CM | POA: Diagnosis not present

## 2014-08-16 DIAGNOSIS — K648 Other hemorrhoids: Secondary | ICD-10-CM

## 2014-08-16 MED ORDER — HYDROCORTISONE ACETATE 25 MG RE SUPP: 25.0000 mg | Freq: Every day | RECTAL | Status: DC

## 2014-08-16 MED ORDER — DILTIAZEM GEL 2 %: 1.0000 "application " | Freq: Three times a day (TID) | CUTANEOUS | Status: DC

## 2014-08-16 NOTE — Telephone Encounter (Signed)
No voice mail set up to inform patient of appointment changed for 07/05 to 06/28. Mailed calendar for June.

## 2014-08-16 NOTE — Patient Instructions (Signed)
We have sent the following medications to your pharmacy for you to pick up at your convenience: Anusol suppositories  Restart your diltiazem gel-three times daily x 4 weeks. If you need refills, please let us know.  Continue Miralax daily.  Please purchase the following medications over the counter and take as directed: Glycerin suppositories as needed for constipation.  You are scheduled for hemorrhoidal banding with Dr Carlean Purl on Thursday 09/05/14 @ 1:30 pm

## 2014-08-16 NOTE — Progress Notes (Signed)
     History of Present Illness:  Ms. Woloszyn is back with rectal pain and bleeding. She had several weeks reprieve after treatment of presumed anal fissure and internal hemorrhoids. Bowels have been doing well on daily MiraLAX and she hasn't needed glycerin suppositories. For unclear reason she began having recurrent rectal pain and bleeding again yesterday. No abdominal pain, she has chronic back pain  Current Medications, Allergies, Past Medical History, Past Surgical History, Family History and Social History were reviewed in [employer] record.  Physical Exam: General: Pleasant, well developed , white female in no acute distress Head: Normocephalic and atraumatic Eyes:  sclerae anicteric, conjunctiva pink  Ears: Normal auditory acuity Lungs: Clear throughout to auscultation Heart: Regular rate and rhythm Abdomen: Soft, non distended, non-tender. No masses, no hepatomegaly. Normal bowel sounds Rectal: External hemorrhoids. Anterior midline slightly edematous, tender but cannot appreciate a fissure. Internal hemorrhoids again seen on anoscopy.  Neurological: Alert oriented x 4, grossly nonfocal Psychological:  Alert and cooperative. Normal mood and affect  Assessment and Recommendations:  81 year old female with recurrent rectal pain and bleeding from hemorrhoids and possible fissure. She is scheduled for hemorrhoidal banding 09/05/14. Patient did well for a few weeks after completing steroid supp and Diltiazem gel.  Now with recurrent symptoms.  On anoscopy she still has inflamed internal hemorrhoids. Anterior midline slightly abnormal looking but I cannot appreciate a definite fissure.    Will retreat hemorrhoids with steroid suppositories.   Also restart diltiazem gel TID.   Continue MiraLAX, avoid constipation. May use glycerin suppositories to supplement MiraLAX if needed.   Re-evaluation at time of hemorrhoid banding 09/05/14   Colonoscopy by Dr. Lajoyce Corners June  2010 done for history of colon polyps. Extent of the exam was to the cecum. Internal hemorrhoids were found. 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 Diverticula scattered throughout the colon

## 2014-08-16 NOTE — Telephone Encounter (Signed)
Caregiver called - patient has had several episodes of rectal bleeding and some abdominal distention yesterday and one time overnight. Does not seem to be severely ill from what I can tell - is asking for an office appt.  i told them we would call back and see if it is possible to see her today - has hx suspected hemorrhoidal bleeding also has hx diverticulitis

## 2014-08-16 NOTE — Telephone Encounter (Signed)
Patient is scheduled for appt today with Tye Savoy RNP.  She and her daughter are notified

## 2014-09-04 NOTE — Progress Notes (Signed)
I agree with the above note, plan 

## 2014-09-05 ENCOUNTER — Ambulatory Visit (INDEPENDENT_AMBULATORY_CARE_PROVIDER_SITE_OTHER): Payer: Medicare Other | Admitting: Internal Medicine

## 2014-09-05 ENCOUNTER — Encounter: Payer: Self-pay | Admitting: Internal Medicine

## 2014-09-05 VITALS — BP 124/60 | HR 72 | Ht 68.0 in | Wt 163.1 lb

## 2014-09-05 DIAGNOSIS — K648 Other hemorrhoids: Secondary | ICD-10-CM

## 2014-09-05 DIAGNOSIS — K602 Anal fissure, unspecified: Secondary | ICD-10-CM | POA: Diagnosis not present

## 2014-09-05 NOTE — Progress Notes (Signed)
Patient ID: Kimberly Acevedo, female   DOB: [birth year], 81 y.o.   MRN: [id number]            Rectal exam with female staff present - posterior sentinel pile - indurated posterior fissure, no mass  Anoscopy was performed with the patient in the left lateral decubitus position while a chaperone was present and revealed Grade 2 internal hemorrhoids all 3 positions   PROCEDURE NOTE: The patient presents with symptomatic grade 2  hemorrhoids, requesting rubber band ligation of his/her hemorrhoidal disease.  All risks, benefits and alternative forms of therapy were described and informed consent was obtained.   The anorectum was pre-medicated with 0.125% NTG The decision was made to band the RA internal hemorrhoid, and the [place] was used to perform band ligation without complication.  Digital anorectal examination was then performed to assure proper positioning of the band, and to adjust the banded tissue as required.  The patient was discharged home without pain or other issues.  Dietary and behavioral recommendations were given and along with follow-up instructions.     The following adjunctive treatments were recommended:  Continue diltiazem gel  The patient will return in 2 weeks  for  follow-up and possible additional banding as required. No complications were encountered and the patient tolerated the procedure well.  I appreciate the opportunity to care for this patient. YQ:[id number],[email or web address] DAVIDSON, MD

## 2014-09-05 NOTE — Patient Instructions (Signed)
HEMORRHOID BANDING PROCEDURE    FOLLOW-UP CARE   1. The procedure you have had should have been relatively painless since the banding of the area involved does not have nerve endings and there is no pain sensation.  The rubber band cuts off the blood supply to the hemorrhoid and the band may fall off as soon as 48 hours after the banding (the band may occasionally be seen in the toilet bowl following a bowel movement). You may notice a temporary feeling of fullness in the rectum which should respond adequately to plain Tylenol or Motrin.  2. Following the banding, avoid strenuous exercise that evening and resume full activity the next day.  A sitz bath (soaking in a warm tub) or bidet is soothing, and can be useful for cleansing the area after bowel movements.     3. To avoid constipation, take two tablespoons of natural wheat bran, natural oat bran, flax, Benefiber or any over the counter fiber supplement and increase your water intake to 7-8 glasses daily.    4. Unless you have been prescribed anorectal medication, do not put anything inside your rectum for two weeks: No suppositories, enemas, fingers, etc.  5. Occasionally, you may have more bleeding than usual after the banding procedure.  This is often from the untreated hemorrhoids rather than the treated one.  Don't be concerned if there is a tablespoon or so of blood.  If there is more blood than this, lie flat with your bottom higher than your head and apply an ice pack to the area. If the bleeding does not stop within a half an hour or if you feel faint, call our office at [phone] or go to the emergency room.  6. Problems are not common; however, if there is a substantial amount of bleeding, severe pain, chills, fever or difficulty passing urine (very rare) or other problems, you should call us at [phone] or report to the nearest emergency room.  7. Do not stay seated continuously for more than 2-3 hours for a day or two  after the procedure.  Tighten your buttock muscles 10-15 times every two hours and take 10-15 deep breaths every 1-2 hours.  Do not spend more than a few minutes on the toilet if you cannot empty your bowel; instead re-visit the toilet at a later time.    Continue your diltiazem gel.   Stop the hydrocortisone suppositories.   We will see you at your next appointment  April 14th  At 2:15pm.    I appreciate the opportunity to care for you.

## 2014-09-19 ENCOUNTER — Ambulatory Visit (INDEPENDENT_AMBULATORY_CARE_PROVIDER_SITE_OTHER): Payer: Medicare Other | Admitting: Internal Medicine

## 2014-09-19 VITALS — BP 108/68 | HR 80 | Wt 164.0 lb

## 2014-09-19 DIAGNOSIS — K648 Other hemorrhoids: Secondary | ICD-10-CM

## 2014-09-19 DIAGNOSIS — K602 Anal fissure, unspecified: Secondary | ICD-10-CM

## 2014-09-19 NOTE — Patient Instructions (Signed)
Follow up as needed

## 2014-09-19 NOTE — Assessment & Plan Note (Signed)
Not evident today but still tender Stay on diltiazem

## 2014-09-19 NOTE — Progress Notes (Signed)
Patient ID: Kimberly Acevedo, female   DOB: [birth year], 81 y.o.   MRN: [id number]        Mildly tender rectal exam - not focal though. No palpable fissure.  PROCEDURE NOTE: The patient presents with symptomatic grade 2hemorrhoids, requesting rubber band ligation of his/her hemorrhoidal disease.  All risks, benefits and alternative forms of therapy were described and informed consent was obtained.   The anorectum was pre-medicated with 0.125% NTG and 5% lidocaine The decision was made to band the RP and LL internal hemorrhoids, and the [name] was used to perform band ligation without complication.  Digital anorectal examination was then performed to assure proper positioning of the band, and to adjust the banded tissue as required.  The patient was discharged home without pain or other issues.  Dietary and behavioral recommendations were given and along with follow-up instructions.     The following adjunctive treatments were recommended:  MiraLAx for constipation and hemorrhoids Diltiazem for fissure  The patient will return prn for  follow-up and possible additional banding as required. No complications were encountered and the patient tolerated the procedure well.  I appreciate the opportunity to care for this patient. JD:[id number],FPOIPP DAVIDSON, MD

## 2014-09-19 NOTE — Assessment & Plan Note (Signed)
RP and LL banded To use MiraLAx and to f/u prn

## 2014-09-26 ENCOUNTER — Telehealth: Payer: Self-pay | Admitting: Internal Medicine

## 2014-09-26 MED ORDER — DILTIAZEM GEL 2 %: 1.0000 "application " | Freq: Three times a day (TID) | CUTANEOUS | Status: DC

## 2014-09-26 NOTE — Telephone Encounter (Signed)
Spoke with patient and it is the diltiazem gel she would like refilled. Prescription sent to [facility].

## 2014-09-26 NOTE — Telephone Encounter (Signed)
Is this ok to refill?  

## 2014-09-26 NOTE — Telephone Encounter (Signed)
I have her on diltiazem gel which needs to get from compounding pharmacy ? If she got from [facility] or another one last time but certainly ok to refill x 6

## 2014-10-10 ENCOUNTER — Telehealth: Payer: Self-pay | Admitting: Internal Medicine

## 2014-10-10 NOTE — Telephone Encounter (Signed)
Patient reports she has a external hemorrhoid that she wants to have removed.  I explained that this is not possible with the bandings, can only treat an internal hemorrhoid. She is no longer having any bleeding.  She will call back if she has any additional questions or concerns

## 2014-11-18 ENCOUNTER — Telehealth: Payer: Self-pay | Admitting: Internal Medicine

## 2014-11-18 NOTE — Telephone Encounter (Signed)
Pt has painful hemorrhoids to the point that she cant sit down and has to go to bed she feels so bad.  The daughter states she sees a "red mass" at the rectum.   Pt has an appt 11/19/14 with Amy at 130 pm.

## 2014-11-19 ENCOUNTER — Ambulatory Visit (INDEPENDENT_AMBULATORY_CARE_PROVIDER_SITE_OTHER): Payer: Medicare Other | Admitting: Physician Assistant

## 2014-11-19 ENCOUNTER — Encounter: Payer: Self-pay | Admitting: Physician Assistant

## 2014-11-19 ENCOUNTER — Telehealth: Payer: Self-pay | Admitting: *Deleted

## 2014-11-19 VITALS — BP 116/52 | HR 76 | Ht 67.0 in | Wt 166.0 lb

## 2014-11-19 DIAGNOSIS — R1031 Right lower quadrant pain: Secondary | ICD-10-CM

## 2014-11-19 DIAGNOSIS — K6289 Other specified diseases of anus and rectum: Secondary | ICD-10-CM

## 2014-11-19 DIAGNOSIS — K625 Hemorrhage of anus and rectum: Secondary | ICD-10-CM | POA: Diagnosis not present

## 2014-11-19 DIAGNOSIS — K602 Anal fissure, unspecified: Secondary | ICD-10-CM

## 2014-11-19 MED ORDER — LIDOCAINE HCL 2 % EX GEL: 1.0000 "application " | CUTANEOUS | Status: DC | PRN

## 2014-11-19 MED ORDER — DILTIAZEM GEL 2 %: 1.0000 "application " | Freq: Three times a day (TID) | CUTANEOUS | Status: DC

## 2014-11-19 NOTE — Progress Notes (Signed)
Patient ID: Kimberly Acevedo, female   DOB: [birth year], 81 y.o.   MRN: [id number]   Subjective:    Patient ID: Kimberly Acevedo, female    DOB: [birth year], 81 y.o.   MRN: [id number]  HPI  Kimberly "LOU" is an 81 year old white female known to Dr. Ardis Hughs who has undergone hemorrhoidal banding per Dr. Carlean Purl. She had been noted to have internal hemorrhoids and was complaining of rectal pain and bleeding. She had initial banding 1 done in March 2016 and then 2 additional bands placed  in April 2016. Patient had also been noted on exam to have an anal fissure and was prescribed diltiazem gel. She comes in today with complaints of ongoing rectal pain which is been constant over the past 3-4 months. She says she did not see any improvement after the hemorrhoidal banding Her caregiver who accompanies her says she seems to be uncomfortable all the time and complains of rectal pain. Patient says she has noticed streaks of blood on her stool which seemed to be on the right side of the stool. She also can identify that her pain is on the right side of the rectum. She has been using MiraLAX intermittently, caregiver feels that she does have some straining and hard stools at times. They have not been using the diltiazem gel, is not clear how long she used this for but she says she ran out of it. Patient says that she has severe pain with bowel movements but that her rectum hurts all the time it is worse with sitting.  Review of Systems Pertinent positive and negative review of systems were noted in the above HPI section.  All other review of systems was otherwise negative.  Outpatient Encounter Prescriptions as of 11/19/2014  Medication Sig  . acetaminophen (TYLENOL) 500 MG tablet Take 500 mg by mouth every 6 (six) hours as needed. For arthritis  . Cholecalciferol (VITAMIN D) 2000 UNITS CAPS Take 1 capsule by mouth daily.  . clobetasol (TEMOVATE) 0.05 % external solution Apply 1 application topically 2 (two) times  daily.  Marland Kitchen diltiazem 2 % GEL Apply 1 application topically 3 (three) times daily.  . feeding supplement, RESOURCE BREEZE, (RESOURCE BREEZE) LIQD Take 1 Container by mouth 3 (three) times daily between meals.  Marland Kitchen losartan-hydrochlorothiazide (HYZAAR) 100-25 MG per tablet Take 1 tablet by mouth daily.   . polyethylene glycol (MIRALAX / GLYCOLAX) packet Take 17 g by mouth daily as needed for mild constipation or moderate constipation. Use as directed until your bowel movements are soft and coming daily, then you may decrease to every other day to continue having daily soft BMs  . sertraline (ZOLOFT) 100 MG tablet Take 100 mg by mouth at bedtime.   . [DISCONTINUED] diltiazem 2 % GEL Apply 1 application topically 3 (three) times daily.  . [DISCONTINUED] diltiazem 2 % GEL Apply 1 application topically 3 (three) times daily.  Marland Kitchen lidocaine (XYLOCAINE) 2 % jelly Apply 1 application topically as needed. Apply 4 times daily to the affected area.  . [DISCONTINUED] docusate sodium (COLACE) 100 MG capsule Take 1 capsule (100 mg total) by mouth 2 (two) times daily.  . [DISCONTINUED] NAMENDA XR 21 MG CP24 Take 1 capsule by mouth daily.   . [DISCONTINUED] PROCTOSOL HC 2.5 % rectal cream Place 1 application rectally 2 (two) times daily.   . [DISCONTINUED] vitamin B-12 (CYANOCOBALAMIN) 1000 MCG tablet Take 1,000 mcg by mouth daily.   No facility-administered encounter medications on file as of 11/19/2014.  Allergies  Allergen Reactions  . Penicillins Swelling and Rash  . Statins   . Sulfa Drugs Cross Reactors Shortness Of Breath  . Tramadol Hcl Anaphylaxis  . Aspirin Swelling  . Codeine Other (See Comments)    unknown  . Morphine And Related Other (See Comments)    Hypoglycemia   Patient Active Problem List   Diagnosis Date Noted  . Bleeding internal hemorrhoids 07/16/2014  . Anal fissure - posterior 06/17/2014  . Dementia 06/15/2014  . Generalized abdominal pain   . Abdominal pain   . Pancreatitis  05/24/2014  . Leukocytosis 05/24/2014  . GERD (gastroesophageal reflux disease) 05/24/2014  . Dementia with behavioral disturbance 02/27/2014  . Rectal bleeding 05/21/2013  . Hypokalemia 05/21/2013  . Depression 05/21/2013  . Invasive lobular carcinoma of breast, stage 1 06/23/2012  . Hypertension   . Fibromyalgia   . Achalasia, esophageal    History   Social History  . Marital Status: Widowed    Spouse Name: N/A  . Number of Children: 1  . Years of Education: 11   Occupational History  . interior design   . Retired     Social History Main Topics  . Smoking status: Never Smoker   . Smokeless tobacco: Never Used  . Alcohol Use: No  . Drug Use: No  . Sexual Activity: Not Currently   Other Topics Concern  . Not on file   Social History Narrative   Patient lives at home alone with 2 caregivers.    Patient is retired.    Patient has 1 child.    Patient has an 11th grade education.    Patient is right handed.     Ms. Frogge family history includes Arthritis in her father; Cancer (age of onset: 18) in her mother. There is no history of Dementia.      Objective:    Filed Vitals:   11/19/14 1326  BP: 116/52  Pulse: 76    Physical Exam  well-developed elderly white female in no acute distress, accompanied by a caregiver blood pressure 116/52 pulse 76 height 5 foot 7 weight 166 HEENT ;nontraumatic normocephalic EOMI PERRLA sclera anicteric, Supple ;no JVD, Cardiovascular ;regular rate and rhythm with S1-S2 no murmur or gallop, Pulmonary; clear bilaterally, Abdomen; soft bowel sounds are present she is tender in the right lower quadrant right mid quadrant is no guarding, there is some fullness in the right lower quadrant no palpable mass or hepatosplenomegaly, Rectal; exam she has external hemorrhoidal tags, no prolapsing hemorrhoids she's exquisitely tender to digital exam with a palpable fissure on the right at about the 9:00 position, on anoscopy, I do not see internal  hemorrhoids, she does have some oozing of heme on the right consistent with fissure., Ext; no clubbing cyanosis or edema skin warm and dry, Neuropsych mood and affect appropriate patient somewhat irritable       Assessment & Plan:   #1 81 yo female with persistent ano-rectal pain x several months, intermittent rectal bleeding  No improvement with hemorrhoid banding x 3 She has a palpable fissure on exam and I believe her sxs are secondary to non-healing anal fissure.  #2 last colonoscopy 2011-Dr.Orr- cecal avm;s, and scattered divertiulosis #3 dementia #4 fibromyalgia 35 hx of achalasia  Plan; Hot tub soaks BID Cardizem gel 2% apply 4  X daily internally to fissure Add lidocaine 5% cream 4-5 x daily, can be applied at same time  miralax one  half dose daily  Tylenol twice daily  Surgical  l referral to Dr Marcello Moores- consider Botox vs surgery.     Wolf Boulay Genia Harold PA-C 11/19/2014   Cc: Deland Pretty, MD

## 2014-11-19 NOTE — Telephone Encounter (Signed)
Spoke with Janett Billow at [facility] and got an appointment for this patient with Dr. Henri Medal for 12-17-2014 at 9:00 am . She is to arrive at 8:30 am .  Patient notified.

## 2014-11-19 NOTE — Patient Instructions (Signed)
Please go to the basement level to have your labs drawn.  Take 1/2 dose of Miralax daily in a glass of water. We sent prescriptions to [facility]. 1. Diltiazem gel 2 % 2. Lidocaine gel  Take Tylenol for pain. We will call you when we get the appointment with Dr. Leighton Ruff at [facility].    You have been scheduled for a CT scan of the abdomen and pelvis at [facility] ([address]---this is in the same building as Press [facility]).   You are scheduled on Thursday 11-21-2014 at  1:30 PM . You should arrive at 1:15 r to your appointment time for registration. Please follow the written instructions below on the day of your exam:  WARNING: IF YOU ARE ALLERGIC TO IODINE/X-RAY DYE, PLEASE NOTIFY RADIOLOGY IMMEDIATELY AT [phone]! YOU WILL BE GIVEN A 13 HOUR PREMEDICATION PREP.  1) Do not eat or drink anything after 9:30 am  (4 hours prior to your test) 2) You have been given 2 bottles of oral contrast to drink. The solution may taste better if refrigerated, but do NOT add ice or any other liquid to this solution. Shake well before drinking.    Drink 1 bottle of contrast @ 11:30 am  (2 hours prior to your exam)  Drink 1 bottle of contrast @ 12:30 PM (1 hour prior to your exam)  You may take any medications as prescribed with a small amount of water except for the following: Metformin, Glucophage, Glucovance, Avandamet, Riomet, Fortamet, Actoplus Met, Janumet, Glumetza or Metaglip. The above medications must be held the day of the exam AND 48 hours after the exam.  The purpose of you drinking the oral contrast is to aid in the visualization of your intestinal tract. The contrast solution may cause some diarrhea. Before your exam is started, you will be given a small amount of fluid to drink. Depending on your individual set of symptoms, you may also receive an intravenous injection of x-ray contrast/dye. Plan on being at [facility] for 30  minutes or long, depending on the type of exam you are having performed.  If you have any questions regarding your exam or if you need to reschedule, you may call the CT department at [phone] between the hours of 8:00 am and 5:00 pm, Monday-Friday.  ________________________________________________________________________

## 2014-11-20 ENCOUNTER — Telehealth: Payer: Self-pay | Admitting: *Deleted

## 2014-11-20 NOTE — Telephone Encounter (Signed)
Called and spoke to caregiver Altha Harm. Patient was napping. I advised Altha Harm that she has an appt with Dr. Henri Medal at [facility] for 12-17-2014 at 9:00 am .  She is to arrive at 8:30 Am. Altha Harm said she will give the informtation to the patient and her daughter [name].  Christine who accompanied the pt yesterday when she saw Amy, said they rescheduled the CT scan to 11-28-2014.  She said they will come to our lab for her bloodwork prior to the 23rd.  She did not go down to the lab yesterday.

## 2014-11-20 NOTE — Progress Notes (Signed)
i agree with the above note, plan 

## 2014-11-21 ENCOUNTER — Inpatient Hospital Stay: Admission: RE | Admit: 2014-11-21 | Payer: Medicare Other | Source: Ambulatory Visit

## 2014-11-28 ENCOUNTER — Inpatient Hospital Stay: Admission: RE | Admit: 2014-11-28 | Payer: Medicare Other | Source: Ambulatory Visit

## 2014-11-28 ENCOUNTER — Other Ambulatory Visit (INDEPENDENT_AMBULATORY_CARE_PROVIDER_SITE_OTHER): Payer: Medicare Other

## 2014-11-28 ENCOUNTER — Telehealth: Payer: Self-pay | Admitting: *Deleted

## 2014-11-28 ENCOUNTER — Telehealth: Payer: Self-pay | Admitting: Physician Assistant

## 2014-11-28 DIAGNOSIS — K602 Anal fissure, unspecified: Secondary | ICD-10-CM

## 2014-11-28 DIAGNOSIS — K625 Hemorrhage of anus and rectum: Secondary | ICD-10-CM | POA: Diagnosis not present

## 2014-11-28 DIAGNOSIS — R1031 Right lower quadrant pain: Secondary | ICD-10-CM | POA: Diagnosis not present

## 2014-11-28 DIAGNOSIS — K6289 Other specified diseases of anus and rectum: Secondary | ICD-10-CM

## 2014-11-28 LAB — CBC WITH DIFFERENTIAL/PLATELET
BASOS PCT: 0.3 % (ref 0.0–3.0)
Basophils Absolute: 0 10*3/uL (ref 0.0–0.1)
EOS ABS: 0 10*3/uL (ref 0.0–0.7)
Eosinophils Relative: 0.5 % (ref 0.0–5.0)
HEMATOCRIT: 44.8 % (ref 36.0–46.0)
HEMOGLOBIN: 15 g/dL (ref 12.0–15.0)
LYMPHS ABS: 3.7 10*3/uL (ref 0.7–4.0)
Lymphocytes Relative: 39 % (ref 12.0–46.0)
MCHC: 33.5 g/dL (ref 30.0–36.0)
MCV: 93.5 fl (ref 78.0–100.0)
MONO ABS: 0.8 10*3/uL (ref 0.1–1.0)
Monocytes Relative: 8.6 % (ref 3.0–12.0)
Neutro Abs: 4.8 10*3/uL (ref 1.4–7.7)
Neutrophils Relative %: 51.6 % (ref 43.0–77.0)
Platelets: 245 10*3/uL (ref 150.0–400.0)
RBC: 4.79 Mil/uL (ref 3.87–5.11)
RDW: 12.8 % (ref 11.5–15.5)
WBC: 9.4 10*3/uL (ref 4.0–10.5)

## 2014-11-28 LAB — BASIC METABOLIC PANEL
BUN: 19 mg/dL (ref 6–23)
CO2: 29 meq/L (ref 19–32)
Calcium: 9.5 mg/dL (ref 8.4–10.5)
Chloride: 100 mEq/L (ref 96–112)
Creatinine, Ser: 1.15 mg/dL (ref 0.40–1.20)
GFR: 47.74 mL/min — ABNORMAL LOW (ref >60.00)
GLUCOSE: 88 mg/dL (ref 70–99)
Potassium: 3.9 mEq/L (ref 3.5–5.1)
SODIUM: 136 meq/L (ref 135–145)

## 2014-11-28 NOTE — Telephone Encounter (Signed)
I spoke to Kimberly Acevedo, the patient's caregiver. The patient was sitting by the caregiver and I could hear her talking.  She said she isn't sure she wants to have the Ct scan.  Kimberly Acevedo said they may come for the labs today and at that time, Kimberly Acevedo said she may come to the 3rd floor and talk to me.  The patient is being difficult.  I called and cancelled the CT scan for today.  Kimberly Acevedo said she will let me know if she can talk the patient into rescheduling the CT scan.

## 2014-11-28 NOTE — Telephone Encounter (Signed)
I called [phone] and got no answer.  I was calling to reschedule the patient's CT Scan . I did notice she did come today 6-23 to get her labs drawn.

## 2014-12-02 MED ORDER — LIDOCAINE HCL 2 % EX GEL: 1.0000 "application " | CUTANEOUS | Status: DC | PRN

## 2014-12-02 NOTE — Telephone Encounter (Signed)
Enid Skeens and the patient that the Ct scan is scheduled for Thursday 12-05-2014. She is to arrive at 9:45 am and she is to drink the 1st bottle of contrast at 8:00 am , and the 2nd at 9:00 am.  They have the address of [address].  I also advised we sent the prescription to [facility] for the Xylocaine jelly.

## 2014-12-02 NOTE — Telephone Encounter (Signed)
Called and spoke to the patient and Altha Harm the caregiver.  I asked when the patient wants to do the CT scan.  They decided on Thursday.  Also they asked me to call in refill for the Xylocaine which I will send to [facility].

## 2014-12-03 ENCOUNTER — Ambulatory Visit: Payer: Medicare Other | Admitting: Hematology

## 2014-12-03 ENCOUNTER — Telehealth: Payer: Self-pay | Admitting: Hematology

## 2014-12-03 NOTE — Telephone Encounter (Signed)
pt cld to CX appt stating that not feeling well and will cll back to r/s

## 2014-12-05 ENCOUNTER — Ambulatory Visit (INDEPENDENT_AMBULATORY_CARE_PROVIDER_SITE_OTHER)
Admission: RE | Admit: 2014-12-05 | Discharge: 2014-12-05 | Disposition: A | Discharge status: Home / Self Care | Payer: Medicare Other | Source: Ambulatory Visit | Attending: Physician Assistant | Admitting: Physician Assistant

## 2014-12-05 DIAGNOSIS — K625 Hemorrhage of anus and rectum: Secondary | ICD-10-CM | POA: Diagnosis not present

## 2014-12-05 DIAGNOSIS — K6289 Other specified diseases of anus and rectum: Secondary | ICD-10-CM

## 2014-12-05 DIAGNOSIS — K602 Anal fissure, unspecified: Secondary | ICD-10-CM | POA: Diagnosis not present

## 2014-12-05 DIAGNOSIS — R1031 Right lower quadrant pain: Secondary | ICD-10-CM | POA: Diagnosis not present

## 2014-12-05 MED ORDER — IOHEXOL 300 MG/ML  SOLN
100.0000 mL | Freq: Once | INTRAMUSCULAR | Status: AC | PRN
  Administered 2014-12-05: 100 mL via INTRAVENOUS

## 2014-12-10 ENCOUNTER — Ambulatory Visit: Payer: Medicare Other | Admitting: Hematology

## 2014-12-11 ENCOUNTER — Telehealth: Payer: Self-pay | Admitting: Physician Assistant

## 2014-12-11 ENCOUNTER — Ambulatory Visit: Payer: Medicare Other | Admitting: Neurology

## 2014-12-11 NOTE — Telephone Encounter (Signed)
Reviewed results with patient. 

## 2014-12-19 ENCOUNTER — Other Ambulatory Visit: Payer: Self-pay | Admitting: General Surgery

## 2014-12-19 ENCOUNTER — Encounter: Payer: Self-pay | Admitting: Physician Assistant

## 2014-12-19 ENCOUNTER — Ambulatory Visit (INDEPENDENT_AMBULATORY_CARE_PROVIDER_SITE_OTHER): Payer: Medicare Other | Admitting: Physician Assistant

## 2014-12-19 VITALS — BP 130/68 | HR 80 | Ht 67.0 in | Wt 163.0 lb

## 2014-12-19 DIAGNOSIS — K5900 Constipation, unspecified: Secondary | ICD-10-CM | POA: Diagnosis not present

## 2014-12-19 DIAGNOSIS — K5730 Diverticulosis of large intestine without perforation or abscess without bleeding: Secondary | ICD-10-CM | POA: Diagnosis not present

## 2014-12-19 DIAGNOSIS — K602 Anal fissure, unspecified: Secondary | ICD-10-CM | POA: Diagnosis not present

## 2014-12-19 MED ORDER — DILTIAZEM GEL 2 %: 1.0000 "application " | Freq: Three times a day (TID) | CUTANEOUS | Status: DC

## 2014-12-19 MED ORDER — LIDOCAINE HCL 2 % EX GEL: 1.0000 "application " | CUTANEOUS | Status: DC | PRN

## 2014-12-19 NOTE — Progress Notes (Addendum)
Patient ID: Kimberly Acevedo, female   DOB: 1928/10/24, 81 y.o.   MRN: [id number]   Subjective:    Patient ID: Kimberly Acevedo, female    DOB: May 19, 1929, 81 y.o.   MRN: [id number]  HPI  Kimberly "LOU" is an 81 year old white female known to Dr. Ardis Hughs. She had undergone hemorrhoidal banding per Dr. Carlean Purl earlier this year. She had also been noted to have an anal fissure when seen in April 2016. She was seen in the office by myself about a month ago with complaints of ongoing rectal pain over the prior 3-4 months. She had no improvement in her symptoms after the hemorrhoidal banding and was complaining of streaks of blood in her stool. She identified that her pain was on the right side of her rectum. Her caregiver reported that she seems to have severe pain with bowel movements and has chronic problems with constipation. On exam she was noted to have a persistent anal fissure. She was started on Cardizem gel 2% 4 times daily and lidocaine cream 5% 4-5 times daily. She was also encouraged to take MiraLAX on a daily basis. She had also been complaining of right lower quadrant pain. Initially she decided she didn't want any further testing then call back and we proceeded with CT of the abdomen and pelvis on 12/05/2014. She was noted to have mild diffuse diverticulosis and pelvic floor laxity with evidence of her rectocele. Also some increase in a previously noted left upper pole kidney lesion question complex cyst. She comes in today stating that she feels fine. Her caregiver seems to think she still complains a lot of lower abdominal discomfort and says she is not having any ongoing pain. She is having bowel movements. Occasionally sees a little bit of blood but says that she's not having is better rectal pain as she had previously. We had arranged consultation with Dr. Marcello Moores for more definitive management of her anal fissure. Her caregiver states she is to have a procedure with Dr. Marcello Moores next week.  Review  of Systems Pertinent positive and negative review of systems were noted in the above HPI section.  All other review of systems was otherwise negative.  Outpatient Encounter Prescriptions as of 12/19/2014  Medication Sig  . acetaminophen (TYLENOL) 500 MG tablet Take 500 mg by mouth every 6 (six) hours as needed. For arthritis  . Cholecalciferol (VITAMIN D) 2000 UNITS CAPS Take 1 capsule by mouth daily.  . clobetasol (TEMOVATE) 0.05 % external solution Apply 1 application topically 2 (two) times daily.  Marland Kitchen diltiazem 2 % GEL Apply 1 application topically 3 (three) times daily.  Marland Kitchen doxycycline (VIBRA-TABS) 100 MG tablet Take 100 mg by mouth 2 (two) times daily.  . feeding supplement, RESOURCE BREEZE, (RESOURCE BREEZE) LIQD Take 1 Container by mouth 3 (three) times daily between meals.  . lidocaine (XYLOCAINE) 2 % jelly Apply 1 application topically as needed. Apply 4 times daily to the affected area.  Marland Kitchen losartan-hydrochlorothiazide (HYZAAR) 100-25 MG per tablet Take 1 tablet by mouth daily.   . polyethylene glycol (MIRALAX / GLYCOLAX) packet Take 17 g by mouth daily as needed for mild constipation or moderate constipation. Use as directed until your bowel movements are soft and coming daily, then you may decrease to every other day to continue having daily soft BMs  . sertraline (ZOLOFT) 100 MG tablet Take 100 mg by mouth at bedtime.   . [DISCONTINUED] diltiazem 2 % GEL Apply 1 application topically 3 (three) times daily.  . [  DISCONTINUED] lidocaine (XYLOCAINE) 2 % jelly Apply 1 application topically as needed. Apply 4 times daily to the affected area.  . [DISCONTINUED] lidocaine (XYLOCAINE) 2 % jelly Apply 1 application topically as needed. Apply 4 times daily to the affected area.   No facility-administered encounter medications on file as of 12/19/2014.   Allergies  Allergen Reactions  . Penicillins Swelling and Rash  . Statins   . Sulfa Drugs Cross Reactors Shortness Of Breath  . Tramadol Hcl  Anaphylaxis  . Aspirin Swelling  . Codeine Other (See Comments)    unknown  . Morphine And Related Other (See Comments)    Hypoglycemia   Patient Active Problem List   Diagnosis Date Noted  . Bleeding internal hemorrhoids 07/16/2014  . Anal fissure - posterior 06/17/2014  . Dementia 06/15/2014  . Generalized abdominal pain   . Abdominal pain   . Pancreatitis 05/24/2014  . Leukocytosis 05/24/2014  . GERD (gastroesophageal reflux disease) 05/24/2014  . Dementia with behavioral disturbance 02/27/2014  . Rectal bleeding 05/21/2013  . Hypokalemia 05/21/2013  . Depression 05/21/2013  . Invasive lobular carcinoma of breast, stage 1 06/23/2012  . Hypertension   . Fibromyalgia   . Achalasia, esophageal    History   Social History  . Marital Status: Widowed    Spouse Name: N/A  . Number of Children: 1  . Years of Education: 11   Occupational History  . interior design   . Retired     Social History Main Topics  . Smoking status: Never Smoker   . Smokeless tobacco: Never Used  . Alcohol Use: No  . Drug Use: No  . Sexual Activity: Not Currently   Other Topics Concern  . Not on file   Social History Narrative   Patient lives at home alone with 2 caregivers.    Patient is retired.    Patient has 1 child.    Patient has an 11th grade education.    Patient is right handed.     Kimberly Acevedo family history includes Arthritis in her father; Cancer (age of onset: 58) in her mother. There is no history of Dementia.      Objective:    Filed Vitals:   12/19/14 1407  BP: 130/68  Pulse: 80    Physical Exam well-developed elderly white female in no acute distress, in good spirits. Blood pressure 130/68 pulse 80 height 5 foot 7 weight 163. HEENT; nontraumatic normocephalic EOMI PERRLA sclera anicteric, Cardiovascular ;regular rate and rhythm with S1-S2 no murmur or gallop, Pulmonary; clear bilaterally, Abdomen ;soft is no focal tenderness no guarding or rebound no palpable  mass or hepatosplenomegaly bowel sounds are present, Rectal ;exam not repeated today, Extremities; no clubbing cyanosis or edema skin warm and dry, Psych; mood and affect appropriate, she does have history of dementia       Assessment & Plan:   #1 81 yo female with dementia with chronic constipation #2 persistent  Anal fissure- Dr Marcello Moores now managing #3 diverticulosis #4 hx of internal hemorrhoids-s/p banding earlier this year  Plan; Continue Miralax daily  Refill cardizem gel and lidocaine ream-to be applied internally for fissure  Keep appt with surgery/Dr Marcello Moores  Follow up as needed  Addendum- Dr. Manon Hilding notes reviewed- pt is scheduled for a hemorrhoidal pexy for  prolapsing internal hemorrhoids- was also told could stop the cardizem cream  Amy S Esterwood PA-C 12/19/2014   Cc: Deland Pretty, MD

## 2014-12-19 NOTE — Progress Notes (Signed)
i agree with the above note, plan 

## 2014-12-19 NOTE — Patient Instructions (Signed)
We sent refills for the Lidocaine jelly 2%  To [facility].  Use the Diltiazem gel 2 % Apply 3 times daily.    Continue Miralax 17 grams in 8 oz of water daily.

## 2014-12-23 ENCOUNTER — Encounter (HOSPITAL_BASED_OUTPATIENT_CLINIC_OR_DEPARTMENT_OTHER): Payer: Self-pay | Admitting: *Deleted

## 2014-12-24 ENCOUNTER — Encounter (HOSPITAL_BASED_OUTPATIENT_CLINIC_OR_DEPARTMENT_OTHER): Payer: Self-pay | Admitting: *Deleted

## 2014-12-24 NOTE — Progress Notes (Signed)
NUMBER GIVEN BY OFFICE WAS  PT'S NURSE AIDE/ CAREGIVER, Kimberly Acevedo.  WHOM TAKES CARE OF PT AND DOES HER MEDICATION, WAS ABLE TO VERIFY MEDICATION WITH HER. SHE STATES THAT SHE WILL BE BRING PT DOS, PT DAUGHTER WAS NOT COMING (Kimberly Acevedo).  TRIED CALLING Kimberly SHE WILL NOT BE AVAILABLE UNTIL AFTER 1430 TODAY.  PT IS FUNCTIONAL DEMENTIA PT(DOCUMENTEN IN EPIC), SHE CAN DO ADL'S BUT NOT RUN A HOUSEHOLD.  AFTER I SPEAK WITH DAUGHTER WILL KNOW MORE ABOUT PT'S ABILITY TO BE COMPETENT TO SIGN PERMIT.  I DID GIVE PT'S AIDE, Kimberly Acevedo INSTRUCTIONS ABOUT NO MEDS TO BE TAKEN DOS AND NPO AFTER MN.  PT TO ARRRIVE AT 0930.  CURRENT LAB RESULTS AND EKG IN CHART AND EPIC.

## 2014-12-24 NOTE — Progress Notes (Signed)
SPOKE W/ PT'S DAUGHTER, MICHELE HOOPER.  NPO AFTER MN.  ARRIVE AT 0930.  SHE STATES DOES NOT HAVE HCPOA BUT THAT SHE IS ON HIPPA.  DAUGHTER WILL BE HERE DOS AND STATED THAT HER MOTHER COULD GET VERY ANXIOUS AND CAUSE IGATATION DUE TO ENVIRONMENT BEING UNFAMILIAR AND HAS REQUESTED THAT SHE AND PT AIDE BE WITH PT PRE-OP.

## 2014-12-25 ENCOUNTER — Ambulatory Visit (HOSPITAL_BASED_OUTPATIENT_CLINIC_OR_DEPARTMENT_OTHER): Payer: Medicare Other | Admitting: Anesthesiology

## 2014-12-25 ENCOUNTER — Ambulatory Visit (HOSPITAL_BASED_OUTPATIENT_CLINIC_OR_DEPARTMENT_OTHER)
Admission: RE | Admit: 2014-12-25 | Discharge: 2014-12-25 | Disposition: A | Discharge status: Home / Self Care | Payer: Medicare Other | Source: Ambulatory Visit | Attending: General Surgery | Admitting: General Surgery

## 2014-12-25 ENCOUNTER — Encounter (HOSPITAL_BASED_OUTPATIENT_CLINIC_OR_DEPARTMENT_OTHER)
Admission: RE | Disposition: A | Discharge status: Home / Self Care | Payer: Self-pay | Source: Ambulatory Visit | Attending: General Surgery

## 2014-12-25 ENCOUNTER — Encounter (HOSPITAL_BASED_OUTPATIENT_CLINIC_OR_DEPARTMENT_OTHER): Payer: Self-pay | Admitting: *Deleted

## 2014-12-25 ENCOUNTER — Ambulatory Visit (HOSPITAL_COMMUNITY): Payer: Medicare Other

## 2014-12-25 DIAGNOSIS — R1030 Lower abdominal pain, unspecified: Secondary | ICD-10-CM

## 2014-12-25 DIAGNOSIS — Z79899 Other long term (current) drug therapy: Secondary | ICD-10-CM | POA: Diagnosis not present

## 2014-12-25 DIAGNOSIS — K642 Third degree hemorrhoids: Secondary | ICD-10-CM | POA: Insufficient documentation

## 2014-12-25 DIAGNOSIS — Z882 Allergy status to sulfonamides status: Secondary | ICD-10-CM | POA: Insufficient documentation

## 2014-12-25 DIAGNOSIS — K6289 Other specified diseases of anus and rectum: Secondary | ICD-10-CM | POA: Insufficient documentation

## 2014-12-25 DIAGNOSIS — Z886 Allergy status to analgesic agent status: Secondary | ICD-10-CM | POA: Insufficient documentation

## 2014-12-25 DIAGNOSIS — Z880 Allergy status to penicillin: Secondary | ICD-10-CM | POA: Insufficient documentation

## 2014-12-25 DIAGNOSIS — F329 Major depressive disorder, single episode, unspecified: Secondary | ICD-10-CM | POA: Diagnosis not present

## 2014-12-25 DIAGNOSIS — I10 Essential (primary) hypertension: Secondary | ICD-10-CM | POA: Diagnosis not present

## 2014-12-25 DIAGNOSIS — G4730 Sleep apnea, unspecified: Secondary | ICD-10-CM | POA: Insufficient documentation

## 2014-12-25 DIAGNOSIS — M797 Fibromyalgia: Secondary | ICD-10-CM | POA: Insufficient documentation

## 2014-12-25 DIAGNOSIS — Z888 Allergy status to other drugs, medicaments and biological substances status: Secondary | ICD-10-CM | POA: Diagnosis not present

## 2014-12-25 DIAGNOSIS — M1990 Unspecified osteoarthritis, unspecified site: Secondary | ICD-10-CM | POA: Insufficient documentation

## 2014-12-25 HISTORY — DX: Asymptomatic varicose veins of unspecified lower extremity: I83.90

## 2014-12-25 HISTORY — DX: Unspecified dementia, unspecified severity, with other behavioral disturbance: F03.918

## 2014-12-25 HISTORY — DX: Unspecified dementia, unspecified severity, with behavioral disturbance: F03.91

## 2014-12-25 HISTORY — DX: Gastro-esophageal reflux disease without esophagitis: K21.9

## 2014-12-25 HISTORY — DX: Personal history of malignant neoplasm, unspecified: Z85.9

## 2014-12-25 HISTORY — DX: Obstructive sleep apnea (adult) (pediatric): G47.33

## 2014-12-25 HISTORY — DX: Personal history of malignant neoplasm of breast: Z85.3

## 2014-12-25 HISTORY — DX: Localized edema: R60.0

## 2014-12-25 HISTORY — DX: Third degree hemorrhoids: K64.2

## 2014-12-25 HISTORY — PX: HEMORRHOID SURGERY: SHX153

## 2014-12-25 HISTORY — DX: Obstructive sleep apnea (adult) (pediatric): Z99.89

## 2014-12-25 HISTORY — DX: Other specified postprocedural states: Z98.890

## 2014-12-25 HISTORY — DX: Personal history of other diseases of the digestive system: Z87.19

## 2014-12-25 HISTORY — DX: Unspecified mental disorder due to known physiological condition: F09

## 2014-12-25 HISTORY — DX: Personal history of irradiation: Z92.3

## 2014-12-25 SURGERY — HEMORRHOIDECTOMY
Anesthesia: General | Site: Anus

## 2014-12-25 MED ORDER — FENTANYL CITRATE (PF) 100 MCG/2ML IJ SOLN
INTRAMUSCULAR | Status: AC
  Filled 2014-12-25: qty 2

## 2014-12-25 MED ORDER — SUCCINYLCHOLINE CHLORIDE 20 MG/ML IJ SOLN
INTRAMUSCULAR | Status: DC | PRN
  Administered 2014-12-25: 80 mg via INTRAVENOUS

## 2014-12-25 MED ORDER — SODIUM CHLORIDE 0.9 % IV SOLN
250.0000 mg | INTRAVENOUS | Status: DC | PRN
  Administered 2014-12-25: 10 ug/kg/min via INTRAVENOUS

## 2014-12-25 MED ORDER — ACETAMINOPHEN 325 MG PO TABS
325.0000 mg | ORAL_TABLET | ORAL | Status: DC | PRN
  Filled 2014-12-25: qty 2

## 2014-12-25 MED ORDER — OXYCODONE HCL 5 MG PO TABS: 5.0000 mg | ORAL_TABLET | Freq: Four times a day (QID) | ORAL | Status: DC | PRN

## 2014-12-25 MED ORDER — FENTANYL CITRATE (PF) 100 MCG/2ML IJ SOLN
INTRAMUSCULAR | Status: AC
  Filled 2014-12-25: qty 4

## 2014-12-25 MED ORDER — ACETAMINOPHEN 10 MG/ML IV SOLN
1000.0000 mg | Freq: Four times a day (QID) | INTRAVENOUS | Status: DC
  Administered 2014-12-25: 1000 mg via INTRAVENOUS
  Filled 2014-12-25: qty 100

## 2014-12-25 MED ORDER — ACETAMINOPHEN 650 MG RE SUPP
650.0000 mg | RECTAL | Status: DC | PRN
  Filled 2014-12-25: qty 1

## 2014-12-25 MED ORDER — PROPOFOL 10 MG/ML IV BOLUS
INTRAVENOUS | Status: DC | PRN
  Administered 2014-12-25: 50 mg via INTRAVENOUS
  Administered 2014-12-25: 100 mg via INTRAVENOUS
  Administered 2014-12-25: 50 mg via INTRAVENOUS

## 2014-12-25 MED ORDER — SODIUM CHLORIDE 0.9 % IJ SOLN
3.0000 mL | INTRAMUSCULAR | Status: DC | PRN
  Filled 2014-12-25: qty 3

## 2014-12-25 MED ORDER — HYDROMORPHONE HCL 1 MG/ML IJ SOLN
INTRAMUSCULAR | Status: AC
  Filled 2014-12-25: qty 1

## 2014-12-25 MED ORDER — PROPOFOL 500 MG/50ML IV EMUL
INTRAVENOUS | Status: DC | PRN
  Administered 2014-12-25: 160 ug/kg/min via INTRAVENOUS

## 2014-12-25 MED ORDER — SODIUM CHLORIDE 0.9 % IV SOLN
250.0000 mL | INTRAVENOUS | Status: DC | PRN
  Filled 2014-12-25: qty 250

## 2014-12-25 MED ORDER — ONDANSETRON HCL 4 MG/2ML IJ SOLN
INTRAMUSCULAR | Status: DC | PRN
  Administered 2014-12-25: 4 mg via INTRAVENOUS

## 2014-12-25 MED ORDER — KETAMINE HCL 10 MG/ML IJ SOLN
INTRAMUSCULAR | Status: AC
  Filled 2014-12-25: qty 1

## 2014-12-25 MED ORDER — FENTANYL CITRATE (PF) 100 MCG/2ML IJ SOLN
INTRAMUSCULAR | Status: DC | PRN
  Administered 2014-12-25 (×4): 25 ug via INTRAVENOUS

## 2014-12-25 MED ORDER — LIDOCAINE HCL (CARDIAC) 20 MG/ML IV SOLN
INTRAVENOUS | Status: DC | PRN
  Administered 2014-12-25: 60 mg via INTRAVENOUS

## 2014-12-25 MED ORDER — OXYCODONE HCL 5 MG PO TABS
5.0000 mg | ORAL_TABLET | ORAL | Status: DC | PRN
  Filled 2014-12-25: qty 2

## 2014-12-25 MED ORDER — BUPIVACAINE LIPOSOME 1.3 % IJ SUSP
INTRAMUSCULAR | Status: DC | PRN
  Administered 2014-12-25: 20 mL

## 2014-12-25 MED ORDER — SODIUM CHLORIDE 0.9 % IJ SOLN
3.0000 mL | Freq: Two times a day (BID) | INTRAMUSCULAR | Status: DC
  Filled 2014-12-25: qty 3

## 2014-12-25 MED ORDER — HYDROMORPHONE HCL 1 MG/ML IJ SOLN
0.5000 mg | INTRAMUSCULAR | Status: DC | PRN
  Administered 2014-12-25 (×2): 0.25 mg via INTRAVENOUS
  Administered 2014-12-25: 0.5 mg via INTRAVENOUS
  Filled 2014-12-25: qty 1

## 2014-12-25 MED ORDER — ACETAMINOPHEN 325 MG PO TABS
650.0000 mg | ORAL_TABLET | ORAL | Status: DC | PRN
  Filled 2014-12-25: qty 2

## 2014-12-25 MED ORDER — BUPIVACAINE-EPINEPHRINE 0.5% -1:200000 IJ SOLN
INTRAMUSCULAR | Status: DC | PRN
  Administered 2014-12-25: 10 mL

## 2014-12-25 MED ORDER — FENTANYL CITRATE (PF) 100 MCG/2ML IJ SOLN
25.0000 ug | INTRAMUSCULAR | Status: DC | PRN
  Administered 2014-12-25: 25 ug via INTRAVENOUS
  Administered 2014-12-25: 50 ug via INTRAVENOUS
  Administered 2014-12-25: 25 ug via INTRAVENOUS
  Administered 2014-12-25: 50 ug via INTRAVENOUS
  Filled 2014-12-25: qty 1

## 2014-12-25 MED ORDER — LACTATED RINGERS IV SOLN
INTRAVENOUS | Status: DC
  Administered 2014-12-25 (×2): via INTRAVENOUS
  Filled 2014-12-25: qty 1000

## 2014-12-25 MED ORDER — ACETAMINOPHEN 160 MG/5ML PO SOLN
325.0000 mg | ORAL | Status: DC | PRN
  Filled 2014-12-25: qty 20.3

## 2014-12-25 SURGICAL SUPPLY — 43 items
BLADE HEX COATED 2.75 (ELECTRODE) IMPLANT
BLADE SURG 10 STRL SS (BLADE) ×2 IMPLANT
BRIEF STRETCH FOR OB PAD LRG (UNDERPADS AND DIAPERS) IMPLANT
COVER BACK TABLE 60X90IN (DRAPES) ×2 IMPLANT
COVER MAYO STAND STRL (DRAPES) ×2 IMPLANT
DRAPE PED LAPAROTOMY (DRAPES) ×2 IMPLANT
DRAPE UTILITY XL STRL (DRAPES) ×2 IMPLANT
DRSG PAD ABDOMINAL 8X10 ST (GAUZE/BANDAGES/DRESSINGS) ×2 IMPLANT
ELECT BLADE 6.5 .24CM SHAFT (ELECTRODE) ×2 IMPLANT
ELECT REM PT RETURN 9FT ADLT (ELECTROSURGICAL) ×2
ELECTRODE REM PT RTRN 9FT ADLT (ELECTROSURGICAL) ×1 IMPLANT
GAUZE SPONGE 4X4 16PLY XRAY LF (GAUZE/BANDAGES/DRESSINGS) IMPLANT
GLOVE BIO SURGEON STRL SZ 6.5 (GLOVE) ×2 IMPLANT
GLOVE BIOGEL PI IND STRL 7.5 (GLOVE) ×2 IMPLANT
GLOVE BIOGEL PI INDICATOR 7.5 (GLOVE) ×2
GLOVE INDICATOR 7.0 STRL GRN (GLOVE) ×2 IMPLANT
GLOVE SURG SS PI 7.5 STRL IVOR (GLOVE) ×2 IMPLANT
GOWN STRL REUS W/TWL 2XL LVL3 (GOWN DISPOSABLE) ×2 IMPLANT
GOWN STRL REUS W/TWL XL LVL3 (GOWN DISPOSABLE) ×2 IMPLANT
MANIFOLD NEPTUNE II (INSTRUMENTS) IMPLANT
NEEDLE HYPO 25X1 1.5 SAFETY (NEEDLE) ×2 IMPLANT
NS IRRIG 500ML POUR BTL (IV SOLUTION) ×2 IMPLANT
PACK BASIN DAY SURGERY FS (CUSTOM PROCEDURE TRAY) ×2 IMPLANT
PAD ABD 8X10 STRL (GAUZE/BANDAGES/DRESSINGS) ×2 IMPLANT
PAD ARMBOARD 7.5X6 YLW CONV (MISCELLANEOUS) ×10 IMPLANT
PENCIL BUTTON HOLSTER BLD 10FT (ELECTRODE) ×2 IMPLANT
SPONGE GAUZE 4X4 12PLY (GAUZE/BANDAGES/DRESSINGS) ×2 IMPLANT
SPONGE SURGIFOAM ABS GEL 100 (HEMOSTASIS) IMPLANT
SPONGE SURGIFOAM ABS GEL 12-7 (HEMOSTASIS) IMPLANT
STAPLER VISISTAT 35W (STAPLE) ×2 IMPLANT
SUT CHROMIC 2 0 SH (SUTURE) IMPLANT
SUT CHROMIC 3 0 SH 27 (SUTURE) IMPLANT
SUT PROLENE 2 0 BLUE (SUTURE) IMPLANT
SUT VIC AB 2-0 SH 27 (SUTURE) ×3
SUT VIC AB 2-0 SH 27XBRD (SUTURE) ×3 IMPLANT
SUT VIC AB 4-0 P-3 18XBRD (SUTURE) IMPLANT
SUT VIC AB 4-0 P3 18 (SUTURE)
SUT VIC AB 4-0 SH 18 (SUTURE) IMPLANT
SYR CONTROL 10ML LL (SYRINGE) ×2 IMPLANT
TRAY DSU PREP LF (CUSTOM PROCEDURE TRAY) ×2 IMPLANT
TUBE CONNECTING 12X1/4 (SUCTIONS) ×2 IMPLANT
WATER STERILE IRR 500ML POUR (IV SOLUTION) IMPLANT
YANKAUER SUCT BULB TIP NO VENT (SUCTIONS) ×2 IMPLANT

## 2014-12-25 NOTE — H&P (Signed)
History of Present Illness Leighton Ruff MD; 1/61/0960 11:02 AM) Patient words: anal fistula.  The patient is a 81 year old female who presents with anal pain. 81 year old female with approximately 4-6 months of anal pain with bowel movements. She was seen Edward Jolly at [facility] and has been placed on MiraLAX and diltiazem gel. She has used the diltiazem ointment intermittently. She states that she underwent banding with Dr. Carlean Purl previously for her internal hemorrhoids and her pain did get better for short time. She occasionally has bleeding with bowel movements. Her last colonoscopy was in 2011 and showed cecal AVM's and scattered diverticulosis. Her caregiver states that she has prolapsing hemorrhoids with bowel movements and sometimes have to be reduced manually.   Other Problems Elbert Ewings, CMA; 12/17/2014 9:07 AM) Breast Cancer Hemorrhoids High blood pressure  Allergies Elbert Ewings, CMA; 12/17/2014 9:09 AM) Penicillin V *PENICILLINS* Statins Support *DIETARY PRODUCTS/DIETARY MANAGEMENT PRODUCTS* Sulfa 10 *OPHTHALMIC AGENTS* TraMADol HCl *CHEMICALS* Aspirin EC *ANALGESICS - NonNarcotic* Codeine Sulfate *ANALGESICS - OPIOID* Morphine Sulfate-NaCl *ANALGESICS - OPIOID*  Medication History Elbert Ewings, CMA; 12/17/2014 9:11 AM) Tylenol (500MG  Capsule, Oral) Active. Vitamin D (2000UNIT Tablet, Oral) Active. Temovate (0.05% Solution, External) Active. Lidocaine HCl (2% Gel, External) Active. Losartan Potassium-HCTZ (100-25MG  Tablet, Oral) Active. Zoloft (100MG  Tablet, Oral) Active. Medications Reconciled  Social History Elbert Ewings, [place]; 12/17/2014 9:07 AM) No alcohol use No drug use Tobacco use Never smoker.  Family History Elbert Ewings, [place]; 12/17/2014 9:07 AM) Breast Cancer Mother.  Pregnancy / Birth History Elbert Ewings, CMA; 12/17/2014 9:07 AM) Romie Minus 1  Review of Systems Elbert Ewings CMA; 12/17/2014 9:07 AM) General Not Present-  Appetite Loss, Chills, Fatigue, Fever, Night Sweats, Weight Gain and Weight Loss. HEENT Not Present- Earache, Hearing Loss, Hoarseness, Nose Bleed, Oral Ulcers, Ringing in the Ears, Seasonal Allergies, Sinus Pain, Sore Throat, Visual Disturbances, Wears glasses/contact lenses and Yellow Eyes. Breast Not Present- Breast Mass, Breast Pain, Nipple Discharge and Skin Changes. Cardiovascular Not Present- Chest Pain, Difficulty Breathing Lying Down, Leg Cramps, Palpitations, Rapid Heart Rate, Shortness of Breath and Swelling of Extremities. Gastrointestinal Present- Hemorrhoids. Not Present- Abdominal Pain, Bloating, Bloody Stool, Change in Bowel Habits, Chronic diarrhea, Constipation, Difficulty Swallowing, Excessive gas, Gets full quickly at meals, Indigestion, Nausea, Rectal Pain and Vomiting. Female Genitourinary Not Present- Frequency, Nocturia, Painful Urination, Pelvic Pain and Urgency. Musculoskeletal Not Present- Back Pain, Joint Pain, Joint Stiffness, Muscle Pain, Muscle Weakness and Swelling of Extremities. Neurological Not Present- Decreased Memory, Fainting, Headaches, Numbness, Seizures, Tingling, Tremor, Trouble walking and Weakness. Psychiatric Not Present- Anxiety, Bipolar, Change in Sleep Pattern, Depression, Fearful and Frequent crying. Endocrine Not Present- Cold Intolerance, Excessive Hunger, Hair Changes, Heat Intolerance, Hot flashes and New Diabetes. Hematology Not Present- Easy Bruising, Excessive bleeding, Gland problems, HIV and Persistent Infections.   Vitals Elbert Ewings CMA; 12/17/2014 9:11 AM) 12/17/2014 9:11 AM Weight: 163 lb Height: 66in Body Surface Area: 1.86 m Body Mass Index: 26.31 kg/m Temp.: 97.56F(Oral)  Pulse: 89 (Regular)  BP: 122/78 (Sitting, Left Arm, Standard)    Physical Exam Leighton Ruff MD; 4/54/0981 11:03 AM) General Mental Status-Alert. General Appearance-Consistent with stated age. Hydration-Well  hydrated. Voice-Normal.  Head and Neck Head-normocephalic, atraumatic with no lesions or palpable masses. Trachea-midline. Thyroid Gland Characteristics - normal size and consistency.  Eye Eyeball - Bilateral-Extraocular movements intact. Sclera/Conjunctiva - Bilateral-No scleral icterus.  Chest and Lung Exam Chest and lung exam reveals -quiet, even and easy respiratory effort with no use of accessory muscles and on auscultation, normal breath sounds,  no adventitious sounds and normal vocal resonance. Inspection Chest Wall - Normal. Back - normal.  Cardiovascular Cardiovascular examination reveals -normal heart sounds, regular rate and rhythm with no murmurs and normal pedal pulses bilaterally.  Abdomen Inspection Inspection of the abdomen reveals - No Hernias. Palpation/Percussion Palpation and Percussion of the abdomen reveal - Soft, Non Tender, No Rebound tenderness, No Rigidity (guarding) and No hepatosplenomegaly. Auscultation Auscultation of the abdomen reveals - Bowel sounds normal.  Rectal Anorectal Exam External - normal sphincter , no anal fissures. Internal - Note: No spincter hypertension noted. Proctoscopic exam-Internal hemorrhoids(R posterior grade 3 internal hemorrhoid with inflammation).  Neurologic Neurologic evaluation reveals -alert and oriented x 3 with no impairment of recent or remote memory. Mental Status-Normal.  Musculoskeletal Global Assessment -Note: no gross deformities.  Normal Exam - Left-Upper Extremity Strength Normal and Lower Extremity Strength Normal. Normal Exam - Right-Upper Extremity Strength Normal and Lower Extremity Strength Normal.    Assessment & Plan Leighton Ruff MD; 0/76/8088 11:05 AM) PROLAPSED INTERNAL HEMORRHOIDS, GRADE 3 (455.2  K64.44) Story: 81 year old female with a 4 to five-month history of rectal pain who presents to the office after being diagnosed with a fissure. She is used  diltiazem ointment on intermittent basis but has not had any resolution of her symptoms. She is status post 1 episode of internal hemorrhoid banding by Dr. Carlean Purl. Her symptoms did slightly improve after this. Impression: On exam today patient has no signs of anal fissure. She does have a very tender and inflamed right posterior internal hemorrhoid. There is also some mild inflammation and prolapse noted of the right anterior and left lateral internal hemorrhoids. I recommended that she stop the diltiazem cream and use the lidocaine ointment as needed. Given the nature of the hemorrhoid, I think most likely she would benefit best from an exam under anesthesia with hemorrhoidal pexy. This most likely will result in approximately 2-3 weeks of worsening pain, but then it should get better after that. I think that once we get the tissue to the point where it isn't prolapsing with bowel movements, her pain will resolve. We discussed the risk of this procedure which include bleeding, pain, recurrence of disease and infection. I believe she understands these risks and has agreed to proceed with the surgery.

## 2014-12-25 NOTE — Op Note (Signed)
12/25/2014  12:01 PM  PATIENT:  Kimberly Acevedo  81 y.o. female  Patient Care Team: Deland Pretty, MD as PCP - General (Internal Medicine)  PRE-OPERATIVE DIAGNOSIS:  Grade 3 prolapsed internal hemorroids  POST-OPERATIVE DIAGNOSIS:  Grade 3 prolapsed internal hemorroids  PROCEDURE:   HEMORRHOIDOPEXY WITH ANAL EXAM UNDER ANESTHESIA   Surgeon(s): Leighton Ruff, MD  ASSISTANT: none   ANESTHESIA:   local and general  SPECIMEN:  No Specimen  DISPOSITION OF SPECIMEN:  N/A  COUNTS:  YES  PLAN OF CARE: Discharge to home after PACU  PATIENT DISPOSITION:  PACU - hemodynamically stable.  INDICATION: 81 y.o. female who presented to my office with worsening rectal pain. On exam she had grade 3 internal hemorrhoids that were very inflamed and tender to palpation. Given her age and medical conditions, I recommended a hemorrhoid pexy. The risk and benefits were explained to her. Consent was signed and placed on the chart prior to the OR.   OR FINDINGS: Grade 3 internal hemorrhoids left lateral, right anterior and right posterior positions.  DESCRIPTION: the patient was identified in the preoperative holding area and taken to the OR where they were laid on the operating room table.  General anesthesia was induced without difficulty. The patient was then positioned in prone jackknife position with buttocks gently taped apart.  The patient was then prepped and draped in usual sterile fashion.  SCDs were noted to be in place prior to the initiation of anesthesia. A surgical timeout was performed indicating the correct patient, procedure, positioning and need for preoperative antibiotics.  A rectal block was performed using Marcaine with epinephrine.    I began with a digital rectal exam.  The patient had a large amount of redundant mucosa and no masses were palpated.  I then placed a Hill-Ferguson anoscope into the anal canal and evaluated this completely.  The patient had a large grade 3 inflamed  internal hemorrhoid at the right anterior position. I began by performing a hemorrhoid pexy on this using a 2-0 Vicryl suture. After this was complete I did the same procedure in the right posterior and left lateral positions. At the end of the case I evaluated the rectum with a proctoscope to ensure patency. Hemostasis was good. I finished by placing a rectal block using Xparel.  The patient tolerated this well. She was awakened from anesthesia and sent to the postanesthesia care unit in stable condition. All counts were correct per operating room staff.

## 2014-12-25 NOTE — Anesthesia Preprocedure Evaluation (Signed)
Anesthesia Evaluation  Patient identified by MRN, date of birth, ID band Patient awake    Reviewed: Allergy & Precautions, NPO status , Patient's Chart, lab work & pertinent test results  History of Anesthesia Complications Negative for: history of anesthetic complications  Airway Mallampati: II  TM Distance: >3 FB Neck ROM: Full    Dental  (+) Teeth Intact   Pulmonary neg shortness of breath, sleep apnea , neg COPDneg recent URI,  breath sounds clear to auscultation        Cardiovascular hypertension, Pt. on medications - angina- Past MI and - CHF Rhythm:Regular     Neuro/Psych PSYCHIATRIC DISORDERS Depression negative neurological ROS     GI/Hepatic negative GI ROS, Neg liver ROS,   Endo/Other  negative endocrine ROS  Renal/GU negative Renal ROS     Musculoskeletal  (+) Arthritis -, Fibromyalgia -  Abdominal   Peds  Hematology negative hematology ROS (+)   Anesthesia Other Findings   Reproductive/Obstetrics                             Anesthesia Physical Anesthesia Plan  ASA: II  Anesthesia Plan: MAC   Post-op Pain Management:    Induction: Intravenous  Airway Management Planned: Nasal Cannula  Additional Equipment: None  Intra-op Plan:   Post-operative Plan:   Informed Consent: I have reviewed the patients History and Physical, chart, labs and discussed the procedure including the risks, benefits and alternatives for the proposed anesthesia with the patient or authorized representative who has indicated his/her understanding and acceptance.   Dental advisory given  Plan Discussed with: CRNA and Surgeon  Anesthesia Plan Comments:         Anesthesia Quick Evaluation

## 2014-12-25 NOTE — Discharge Instructions (Addendum)

## 2014-12-25 NOTE — Transfer of Care (Signed)
Immediate Anesthesia Transfer of Care Note  Patient: Kimberly Acevedo  Procedure(s) Performed: Procedure(s): HEMORRHOIDOPEXY WITH ANAL EXAM (N/A)  Patient Location: PACU  Anesthesia Type:General  Level of Consciousness: awake, alert , oriented and patient cooperative  Airway & Oxygen Therapy: Patient Spontanous Breathing and Patient connected to nasal cannula oxygen  Post-op Assessment: Report given to RN and Post -op Vital signs reviewed and stable  Post vital signs: Reviewed and stable  Last Vitals:  Filed Vitals:   12/25/14 0936  BP: 120/102  Pulse: 69  Temp: 36.8 C  Resp: 16    Complications: No apparent anesthesia complications

## 2014-12-25 NOTE — Anesthesia Postprocedure Evaluation (Signed)
  Anesthesia Post-op Note  Patient: Kimberly Acevedo  Procedure(s) Performed: Procedure(s) (LRB): HEMORRHOIDOPEXY WITH ANAL EXAM (N/A)  Patient Location: PACU  Anesthesia Type: MAC  Level of Consciousness: awake and alert   Airway and Oxygen Therapy: Patient Spontanous Breathing  Post-op Pain: mild  Post-op Assessment: Post-op Vital signs reviewed, Patient's Cardiovascular Status Stable, Respiratory Function Stable, Patent Airway and No signs of Nausea or vomiting  Last Vitals:  Filed Vitals:   12/25/14 1345  BP: 90/53  Pulse: 79  Temp:   Resp: 20    Post-op Vital Signs: stable   Complications: No apparent anesthesia complications

## 2014-12-25 NOTE — Anesthesia Procedure Notes (Addendum)
Procedure Name: Intubation Date/Time: 12/25/2014 11:14 AM Performed by: Wanita Chamberlain Pre-anesthesia Checklist: Patient identified, Timeout performed, Emergency Drugs available, Suction available and Patient being monitored Patient Re-evaluated:Patient Re-evaluated prior to inductionOxygen Delivery Method: Circle system utilized Preoxygenation: Pre-oxygenation with 100% oxygen Intubation Type: IV induction Ventilation: Mask ventilation without difficulty Laryngoscope Size: Miller and 3 Grade View: Grade II Tube type: Oral Tube size: 7.0 mm Number of attempts: 1 Airway Equipment and Method: Stylet Placement Confirmation: ETT inserted through vocal cords under direct vision,  breath sounds checked- equal and bilateral and positive ETCO2 Secured at: 21 cm Tube secured with: Tape Dental Injury: Teeth and Oropharynx as per pre-operative assessment  Difficulty Due To: Difficult Airway- due to reduced neck mobility Comments: Intubation by Dr. Caryl Comes

## 2014-12-26 ENCOUNTER — Encounter (HOSPITAL_BASED_OUTPATIENT_CLINIC_OR_DEPARTMENT_OTHER): Payer: Self-pay | Admitting: General Surgery

## 2015-01-06 ENCOUNTER — Emergency Department (HOSPITAL_COMMUNITY): Payer: Medicare Other

## 2015-01-06 ENCOUNTER — Encounter (HOSPITAL_COMMUNITY): Payer: Self-pay | Admitting: Emergency Medicine

## 2015-01-06 ENCOUNTER — Inpatient Hospital Stay (HOSPITAL_COMMUNITY)
Admission: EM | Admit: 2015-01-06 | Discharge: 2015-01-13 | DRG: 690 | Disposition: A | Discharge status: Home Health | Payer: Medicare Other | Attending: Internal Medicine | Admitting: Internal Medicine

## 2015-01-06 DIAGNOSIS — Z886 Allergy status to analgesic agent status: Secondary | ICD-10-CM

## 2015-01-06 DIAGNOSIS — F0391 Unspecified dementia with behavioral disturbance: Secondary | ICD-10-CM | POA: Diagnosis present

## 2015-01-06 DIAGNOSIS — Z888 Allergy status to other drugs, medicaments and biological substances status: Secondary | ICD-10-CM

## 2015-01-06 DIAGNOSIS — D649 Anemia, unspecified: Secondary | ICD-10-CM | POA: Diagnosis present

## 2015-01-06 DIAGNOSIS — K5900 Constipation, unspecified: Secondary | ICD-10-CM | POA: Diagnosis not present

## 2015-01-06 DIAGNOSIS — Z882 Allergy status to sulfonamides status: Secondary | ICD-10-CM | POA: Diagnosis not present

## 2015-01-06 DIAGNOSIS — K219 Gastro-esophageal reflux disease without esophagitis: Secondary | ICD-10-CM | POA: Diagnosis present

## 2015-01-06 DIAGNOSIS — Z79891 Long term (current) use of opiate analgesic: Secondary | ICD-10-CM | POA: Diagnosis not present

## 2015-01-06 DIAGNOSIS — R112 Nausea with vomiting, unspecified: Secondary | ICD-10-CM | POA: Diagnosis not present

## 2015-01-06 DIAGNOSIS — R1900 Intra-abdominal and pelvic swelling, mass and lump, unspecified site: Secondary | ICD-10-CM

## 2015-01-06 DIAGNOSIS — N390 Urinary tract infection, site not specified: Secondary | ICD-10-CM | POA: Diagnosis not present

## 2015-01-06 DIAGNOSIS — Y838 Other surgical procedures as the cause of abnormal reaction of the patient, or of later complication, without mention of misadventure at the time of the procedure: Secondary | ICD-10-CM | POA: Diagnosis present

## 2015-01-06 DIAGNOSIS — F0390 Unspecified dementia without behavioral disturbance: Secondary | ICD-10-CM

## 2015-01-06 DIAGNOSIS — S3692XA Contusion of unspecified intra-abdominal organ, initial encounter: Secondary | ICD-10-CM | POA: Diagnosis present

## 2015-01-06 DIAGNOSIS — M1990 Unspecified osteoarthritis, unspecified site: Secondary | ICD-10-CM | POA: Diagnosis present

## 2015-01-06 DIAGNOSIS — S3690XA Unspecified injury of unspecified intra-abdominal organ, initial encounter: Secondary | ICD-10-CM | POA: Diagnosis not present

## 2015-01-06 DIAGNOSIS — Z808 Family history of malignant neoplasm of other organs or systems: Secondary | ICD-10-CM

## 2015-01-06 DIAGNOSIS — Z853 Personal history of malignant neoplasm of breast: Secondary | ICD-10-CM | POA: Diagnosis not present

## 2015-01-06 DIAGNOSIS — K6289 Other specified diseases of anus and rectum: Secondary | ICD-10-CM | POA: Diagnosis present

## 2015-01-06 DIAGNOSIS — G4733 Obstructive sleep apnea (adult) (pediatric): Secondary | ICD-10-CM | POA: Diagnosis present

## 2015-01-06 DIAGNOSIS — Z885 Allergy status to narcotic agent status: Secondary | ICD-10-CM

## 2015-01-06 DIAGNOSIS — R197 Diarrhea, unspecified: Secondary | ICD-10-CM | POA: Diagnosis present

## 2015-01-06 DIAGNOSIS — E876 Hypokalemia: Secondary | ICD-10-CM | POA: Diagnosis present

## 2015-01-06 DIAGNOSIS — Z66 Do not resuscitate: Secondary | ICD-10-CM | POA: Diagnosis present

## 2015-01-06 DIAGNOSIS — Z880 Allergy status to penicillin: Secondary | ICD-10-CM

## 2015-01-06 DIAGNOSIS — IMO0001 Reserved for inherently not codable concepts without codable children: Secondary | ICD-10-CM

## 2015-01-06 DIAGNOSIS — E785 Hyperlipidemia, unspecified: Secondary | ICD-10-CM | POA: Diagnosis present

## 2015-01-06 DIAGNOSIS — I10 Essential (primary) hypertension: Secondary | ICD-10-CM | POA: Diagnosis present

## 2015-01-06 DIAGNOSIS — Z79899 Other long term (current) drug therapy: Secondary | ICD-10-CM

## 2015-01-06 DIAGNOSIS — Z90710 Acquired absence of both cervix and uterus: Secondary | ICD-10-CM | POA: Diagnosis not present

## 2015-01-06 DIAGNOSIS — K644 Residual hemorrhoidal skin tags: Secondary | ICD-10-CM | POA: Diagnosis present

## 2015-01-06 DIAGNOSIS — M797 Fibromyalgia: Secondary | ICD-10-CM | POA: Diagnosis present

## 2015-01-06 DIAGNOSIS — IMO0002 Reserved for concepts with insufficient information to code with codable children: Secondary | ICD-10-CM | POA: Diagnosis present

## 2015-01-06 LAB — URINALYSIS, ROUTINE W REFLEX MICROSCOPIC
BILIRUBIN URINE: NEGATIVE
Glucose, UA: NEGATIVE mg/dL
Hgb urine dipstick: NEGATIVE
KETONES UR: NEGATIVE mg/dL
Leukocytes, UA: NEGATIVE
Nitrite: NEGATIVE
Protein, ur: NEGATIVE mg/dL
Specific Gravity, Urine: 1.003 — ABNORMAL LOW (ref 1.005–1.030)
Urobilinogen, UA: 0.2 mg/dL (ref 0.0–1.0)
pH: 7.5 (ref 5.0–8.0)

## 2015-01-06 LAB — CBC
HCT: 35.6 % — ABNORMAL LOW (ref 36.0–46.0)
Hemoglobin: 11.8 g/dL — ABNORMAL LOW (ref 12.0–15.0)
MCH: 31.3 pg (ref 26.0–34.0)
MCHC: 33.1 g/dL (ref 30.0–36.0)
MCV: 94.4 fL (ref 78.0–100.0)
PLATELETS: 458 10*3/uL — AB (ref 150–400)
RBC: 3.77 MIL/uL — ABNORMAL LOW (ref 3.87–5.11)
RDW: 13.2 % (ref 11.5–15.5)
WBC: 9.1 10*3/uL (ref 4.0–10.5)

## 2015-01-06 LAB — COMPREHENSIVE METABOLIC PANEL
ALK PHOS: 58 U/L (ref 38–126)
ALT: 20 U/L (ref 14–54)
AST: 32 U/L (ref 15–41)
Albumin: 3.6 g/dL (ref 3.5–5.0)
Anion gap: 11 (ref 5–15)
BUN: 13 mg/dL (ref 6–20)
CO2: 24 mmol/L (ref 22–32)
Calcium: 9.2 mg/dL (ref 8.9–10.3)
Chloride: 98 mmol/L — ABNORMAL LOW (ref 101–111)
Creatinine, Ser: 0.98 mg/dL (ref 0.44–1.00)
GFR, EST AFRICAN AMERICAN: 60 mL/min — AB (ref >60)
GFR, EST NON AFRICAN AMERICAN: 52 mL/min — AB (ref >60)
Glucose, Bld: 120 mg/dL — ABNORMAL HIGH (ref 65–99)
POTASSIUM: 3 mmol/L — AB (ref 3.5–5.1)
Sodium: 133 mmol/L — ABNORMAL LOW (ref 135–145)
TOTAL PROTEIN: 7.4 g/dL (ref 6.5–8.1)
Total Bilirubin: 1.1 mg/dL (ref 0.3–1.2)

## 2015-01-06 LAB — LIPASE, BLOOD: LIPASE: 19 U/L — AB (ref 22–51)

## 2015-01-06 LAB — TYPE AND SCREEN
ABO/RH(D): O POS
Antibody Screen: NEGATIVE

## 2015-01-06 LAB — MAGNESIUM: Magnesium: 1.4 mg/dL — ABNORMAL LOW (ref 1.7–2.4)

## 2015-01-06 LAB — CLOSTRIDIUM DIFFICILE BY PCR: Toxigenic C. Difficile by PCR: NEGATIVE

## 2015-01-06 MED ORDER — ACETAMINOPHEN 650 MG RE SUPP: 650.0000 mg | Freq: Four times a day (QID) | RECTAL | Status: DC | PRN

## 2015-01-06 MED ORDER — SERTRALINE HCL 100 MG PO TABS
100.0000 mg | ORAL_TABLET | Freq: Every morning | ORAL | Status: DC
  Administered 2015-01-07 – 2015-01-13 (×7): 100 mg via ORAL
  Filled 2015-01-06 (×7): qty 1

## 2015-01-06 MED ORDER — IOHEXOL 300 MG/ML  SOLN
100.0000 mL | Freq: Once | INTRAMUSCULAR | Status: AC | PRN
  Administered 2015-01-06: 100 mL via INTRAVENOUS

## 2015-01-06 MED ORDER — ACETAMINOPHEN 325 MG PO TABS: 650.0000 mg | ORAL_TABLET | Freq: Four times a day (QID) | ORAL | Status: DC | PRN

## 2015-01-06 MED ORDER — SODIUM CHLORIDE 0.9 % IV BOLUS (SEPSIS)
1000.0000 mL | Freq: Once | INTRAVENOUS | Status: AC
  Administered 2015-01-06: 1000 mL via INTRAVENOUS

## 2015-01-06 MED ORDER — ONDANSETRON HCL 4 MG/2ML IJ SOLN
4.0000 mg | Freq: Once | INTRAMUSCULAR | Status: AC
  Administered 2015-01-06: 4 mg via INTRAVENOUS
  Filled 2015-01-06: qty 2

## 2015-01-06 MED ORDER — POTASSIUM CHLORIDE CRYS ER 20 MEQ PO TBCR
40.0000 meq | EXTENDED_RELEASE_TABLET | Freq: Once | ORAL | Status: AC
  Administered 2015-01-07: 40 meq via ORAL
  Filled 2015-01-06 (×2): qty 2

## 2015-01-06 MED ORDER — LOSARTAN POTASSIUM 50 MG PO TABS
100.0000 mg | ORAL_TABLET | Freq: Every day | ORAL | Status: DC
  Administered 2015-01-07: 100 mg via ORAL
  Filled 2015-01-06: qty 2

## 2015-01-06 MED ORDER — ONDANSETRON HCL 4 MG/2ML IJ SOLN
4.0000 mg | Freq: Four times a day (QID) | INTRAMUSCULAR | Status: DC | PRN
  Administered 2015-01-07: 4 mg via INTRAVENOUS
  Filled 2015-01-06: qty 2

## 2015-01-06 MED ORDER — POTASSIUM CHLORIDE 10 MEQ/100ML IV SOLN
10.0000 meq | INTRAVENOUS | Status: AC
  Administered 2015-01-06: 10 meq via INTRAVENOUS
  Filled 2015-01-06 (×2): qty 100

## 2015-01-06 MED ORDER — MAGNESIUM SULFATE 2 GM/50ML IV SOLN
2.0000 g | Freq: Once | INTRAVENOUS | Status: AC
  Administered 2015-01-06: 2 g via INTRAVENOUS
  Filled 2015-01-06: qty 50

## 2015-01-06 MED ORDER — VITAMIN D 1000 UNITS PO TABS
2000.0000 [IU] | ORAL_TABLET | Freq: Every day | ORAL | Status: DC
  Administered 2015-01-07 – 2015-01-13 (×7): 2000 [IU] via ORAL
  Filled 2015-01-06 (×7): qty 2

## 2015-01-06 MED ORDER — LIDOCAINE HCL 2 % EX GEL
1.0000 "application " | CUTANEOUS | Status: DC | PRN
  Administered 2015-01-08 (×2): 1 via TOPICAL
  Filled 2015-01-06 (×6): qty 5

## 2015-01-06 MED ORDER — METRONIDAZOLE IN NACL 5-0.79 MG/ML-% IV SOLN
500.0000 mg | Freq: Once | INTRAVENOUS | Status: AC
Start: 2015-01-06 — End: 2015-01-06
  Administered 2015-01-06: 500 mg via INTRAVENOUS
  Filled 2015-01-06: qty 100

## 2015-01-06 MED ORDER — OXYCODONE HCL 5 MG PO TABS
5.0000 mg | ORAL_TABLET | Freq: Four times a day (QID) | ORAL | Status: DC | PRN
  Administered 2015-01-07 – 2015-01-12 (×7): 5 mg via ORAL
  Filled 2015-01-06 (×8): qty 1

## 2015-01-06 MED ORDER — VITAMIN B-12 1000 MCG PO TABS
1000.0000 ug | ORAL_TABLET | Freq: Every day | ORAL | Status: DC
  Administered 2015-01-07 – 2015-01-13 (×7): 1000 ug via ORAL
  Filled 2015-01-06 (×7): qty 1

## 2015-01-06 MED ORDER — SODIUM CHLORIDE 0.9 % IV SOLN
INTRAVENOUS | Status: AC
  Administered 2015-01-06 – 2015-01-07 (×2): via INTRAVENOUS
  Filled 2015-01-06 (×2): qty 1000

## 2015-01-06 MED ORDER — IOHEXOL 300 MG/ML  SOLN
50.0000 mL | Freq: Once | INTRAMUSCULAR | Status: AC | PRN
Start: 2015-01-06 — End: 2015-01-06
  Administered 2015-01-06: 50 mL via ORAL

## 2015-01-06 MED ORDER — ONDANSETRON HCL 4 MG PO TABS: 4.0000 mg | ORAL_TABLET | Freq: Four times a day (QID) | ORAL | Status: DC | PRN

## 2015-01-06 NOTE — H&P (Signed)
[facility] History and Physical  Kimberly Acevedo:[id number] DOB: 24-Nov-1928 DOA: 01/06/2015  Referring physician: Mr.Danise. PCP: [name], MD  Specialists: Dr.Thomas. Surgeon.  Chief Complaint: Vomiting diarrhea and abdominal pain.  HPI: Kimberly Acevedo is a 81 y.o. female with history of dementia and hypertension who has had a recent hemorrhoidectomy was brought to the ER after patient had persistent diarrhea with nausea and vomiting and abdominal pain. Patient's abdominal pain is mostly in the lower quadrant. In the ER patient had CT abdomen and pelvis which shows large hematoma and on-call surgeon Dr. Harlow Asa was consulted and at this time patient has been admitted for further management for the hematoma and diarrhea. On exam patient is not in distress. In addition patient's labs also shows hypokalemia and hypomagnesemia.   Review of Systems: As presented in the history of presenting illness, rest negative.  Past Medical History  Diagnosis Date  . Fibromyalgia   . Dyslipidemia   . Hypertension   . Hemorrhoids   . Arthritis   . OSA on CPAP   . History of diverticulitis of colon   . GERD (gastroesophageal reflux disease)   . History of breast cancer oncologist-  dr Humphrey Rolls--- no recurrence    dx 05-19-2012  Stage I , Invasive Lobular/ LCIS & microinvasive ductal and DCIS (T1 A NX M0)--  s/p  left partial mastectomy 07-04-2012 and radiation  . History of radiation therapy 08-10-2012 to 09-08-2012    Left breast 750 cGy in 3 sessions  . Prolapsed internal hemorrhoids, grade 3   . Varicose veins   . Cognitive dysfunction     DUE TO DEMENTIA--  PER NEUROLOGIST NOTE SEVERE  . Dementia with behavioral disturbance     PER NEUROLOGIST NOTE PT ABLE TO DO ADL'S BUT UNABLE TO CARE FOR HOME AND NEEDED AND PT HAS 24 HOUR CARE  . History of squamous cell carcinoma excision     NOSE  . Bilateral edema of lower extremity   . Anal fissure    Past Surgical History   Procedure Laterality Date  . Rectocele repair    . Esophageal dilation    . Cholecystectomy  2007  . Colonoscopy    . Breast lumpectomy with needle localization  07/04/2012    Procedure: BREAST LUMPECTOMY WITH NEEDLE LOCALIZATION;  Surgeon: Edward Jolly, MD;  Location: [facility];  Service: General;  Laterality: Left;  left  . Varicose vein surgery  2010    left lower extremity  . Hemorrhoid banding  Mar & Apr 2016  . Total abdominal hysterectomy  1976  . Hemorrhoid surgery N/A 12/25/2014    Procedure: HEMORRHOIDOPEXY WITH ANAL EXAM;  Surgeon: Leighton Ruff, MD;  Location: [facility];  Service: General;  Laterality: N/A;   Social History:  reports that she has never smoked. She has never used smokeless tobacco. She reports that she does not drink alcohol or use illicit drugs. Where does patient live nursing home. Can patient participate in ADLs? No.  Allergies  Allergen Reactions  . Penicillins Swelling and Rash  . Statins   . Sulfa Drugs Cross Reactors Shortness Of Breath  . Tramadol Hcl Anaphylaxis  . Aspirin Swelling  . Codeine Other (See Comments)    unknown  . Morphine And Related Other (See Comments)    Hypoglycemia    Family History:  Family History  Problem Relation Age of Onset  . Cancer Mother 76    melanoma ca  . Arthritis Father   .  Dementia Neg Hx       Prior to Admission medications   Medication Sig Start Date End Date Taking? Authorizing Provider  acetaminophen (TYLENOL) 325 MG tablet Take 650 mg by mouth every 6 (six) hours as needed.   Yes Historical Provider, MD  Cholecalciferol (VITAMIN D) 2000 UNITS CAPS Take 1 capsule by mouth daily.   Yes Historical Provider, MD  diltiazem 2 % GEL Apply 1 application topically 3 (three) times daily.   Yes Historical Provider, MD  doxycycline (VIBRA-TABS) 100 MG tablet Take 100 mg by mouth 2 (two) times daily.   Yes Historical Provider, MD  lidocaine (XYLOCAINE) 2 % jelly Apply 1  application topically as needed. Apply 4 times daily to the affected area. 12/19/14  Yes Amy S Esterwood, PA-C  loperamide (IMODIUM A-D) 2 MG tablet Take 2 mg by mouth 4 (four) times daily as needed for diarrhea or loose stools.   Yes Historical Provider, MD  losartan-hydrochlorothiazide (HYZAAR) 100-25 MG per tablet Take 1 tablet by mouth every morning.  01/11/14  Yes Historical Provider, MD  metroNIDAZOLE (FLAGYL) 500 MG tablet Take 500 mg by mouth every 8 (eight) hours. For 10 Days.   Yes Historical Provider, MD  oxyCODONE (OXY IR/ROXICODONE) 5 MG immediate release tablet Take 1 tablet (5 mg total) by mouth every 6 (six) hours as needed for severe pain. 9/48/54  Yes Leighton Ruff, MD  Probiotic Product (ALIGN) 4 MG CAPS Take 1 capsule by mouth daily.   Yes Historical Provider, MD  sertraline (ZOLOFT) 100 MG tablet Take 100 mg by mouth every morning.    Yes Historical Provider, MD  vitamin B-12 (CYANOCOBALAMIN) 1000 MCG tablet Take 1,000 mcg by mouth daily.   Yes Historical Provider, MD  polyethylene glycol (MIRALAX / GLYCOLAX) packet Take 17 g by mouth daily as needed for mild constipation or moderate constipation. Use as directed until your bowel movements are soft and coming daily, then you may decrease to every other day to continue having daily soft BMs 05/22/14   Mercedes Camprubi-Soms, PA-C    Physical Exam: Filed Vitals:   01/06/15 1427 01/06/15 1921  BP: 106/67 130/49  Pulse: 108 74  Temp: 97.7 F (36.5 C)   TempSrc: Oral   Resp: 16 18  Height: 5\' 11"  (1.803 m)   SpO2: 100% 98%     General:  Moderately built and nourished.  Eyes: Anicteric no pallor.  ENT: No discharge from the ears eyes nose and mouth.  Neck: No mass felt.  Cardiovascular: S1-S2 heard.  Respiratory: No rhonchi or crepitations.  Abdomen: Soft nontender bowel sounds present.  Skin: No rash.  Musculoskeletal: No edema.  Psychiatric: Patient is alert awake and oriented.  Neurologic: Alert oriented to  name and place. Moves all extremities.  Labs on Admission:  Basic Metabolic Panel:  Recent Labs Lab 01/06/15 1508 01/06/15 2023  NA 133*  --   K 3.0*  --   CL 98*  --   CO2 24  --   GLUCOSE 120*  --   BUN 13  --   CREATININE 0.98  --   CALCIUM 9.2  --   MG  --  1.4*   Liver Function Tests:  Recent Labs Lab 01/06/15 1508  AST 32  ALT 20  ALKPHOS 58  BILITOT 1.1  PROT 7.4  ALBUMIN 3.6    Recent Labs Lab 01/06/15 1508  LIPASE 19*   No results for input(s): AMMONIA in the last 168 hours. CBC:  Recent Labs  Lab 01/06/15 1508  WBC 9.1  HGB 11.8*  HCT 35.6*  MCV 94.4  PLT 458*   Cardiac Enzymes: No results for input(s): CKTOTAL, CKMB, CKMBINDEX, TROPONINI in the last 168 hours.  BNP (last 3 results) No results for input(s): BNP in the last 8760 hours.  ProBNP (last 3 results) No results for input(s): PROBNP in the last 8760 hours.  CBG: No results for input(s): GLUCAP in the last 168 hours.  Radiological Exams on Admission: Ct Abdomen Pelvis W Contrast  01/06/2015   CLINICAL DATA:  Nausea, vomiting and diarrhea. Status post hemorrhoid surgery on 12/25/2014.  EXAM: CT ABDOMEN AND PELVIS WITH CONTRAST  TECHNIQUE: Multidetector CT imaging of the abdomen and pelvis was performed using the standard protocol following bolus administration of intravenous contrast.  CONTRAST:  143mL OMNIPAQUE IOHEXOL 300 MG/ML  SOLN  COMPARISON:  12/05/2014.  FINDINGS: Small sliding hiatal hernia. Stable left renal cyst. Multiple colonic diverticula without evidence of diverticulitis. Normal appearing appendix.  There is an oval, mildly heterogeneous, medium to high density mass in the inferior, posterior left pelvis. This measures 9.1 x 6.7 cm on coronal image number 76 and 7.4 cm in width on axial image number 62. This has asymmetrical superior extension on the left, medial to the left ovary. No soft tissue gas or peripheral rim enhancement.  The uterus is surgically absent. Normal  appearing ovaries, urinary bladder, ureters, right kidney, adrenal glands, pancreas and spleen. Mild diffuse low density of the liver relative to the spleen. No small bowel abnormalities.  The lungs are mildly hyperexpanded and clear at the lung bases. There is minimal linear density at both lung bases, compatible with scarring. Lumbar and lower thoracic spine degenerative changes. Atheromatous arterial calcifications.  IMPRESSION: 1. 9.1 x 7.4 x 6.7 cm left inferior, posterior pelvic mass not displacing the rectum to the right. This has appearance most compatible with a postoperative hematoma. 2. Colonic diverticulosis. 3. Small sliding hiatal hernia. 4. Mild diffuse hepatic steatosis. 5. Mild changes of COPD.   Electronically Signed   By: Claudie Revering M.D.   On: 01/06/2015 19:21     Assessment/Plan Active Problems:   Hypertension   Hypokalemia   Dementia   Intra-abdominal hematoma   Normocytic normochromic anemia   Nausea vomiting and diarrhea   1. Nausea vomiting and diarrhea - stool for C. difficile has been negative. Check other GI pathogens. Continue with IV hydration and replace electrolytes. 2. Abdominal hematoma with recent surgery - being followed by general surgery. I have discussed with on-call general surgeon Dr. Harlow Asa. Follow CBC. 3. Hypertension - hold HCTZ. Continue losartan. 4. Hypokalemia and hypomagnesemia - probably from diarrhea and his CTV. Replace and recheck. 5. Dementia - no acute issues.  I have reviewed patient's old charts labs and reviewed patient's chest x-ray personally. I have discussed with on-call surgeon.   DVT Prophylaxis SCDs.  Code Status: DO NOT RESUSCITATE.  Family Communication: Discussed with patient's daughter.  Disposition Plan: Admit to inpatient.    Keyri Salberg N. [facility] Pager [phone].  If 7PM-7AM, please contact night-coverage [email or web address] Password [facility] 01/06/2015, 9:42 PM

## 2015-01-06 NOTE — Consult Note (Signed)
General Surgery [facility]  Reason for Consult:  Pelvic hematoma after hemorrhoidopexy (12/25/2014)  Referring Physician: ER physician, Glenetta Borg is an 81 y.o. female.  HPI: patient is an 81 yo WF status post hemorrhoidopexy x 3 on 5/63/1497 by Dr. Leighton Ruff.  Now presents to ER with complaints of abdominal pain and diarrhea.  CTA shows 9 cm left posterior pelvic hematoma.  Hgb 11.8.  Surgery is asked to evaluate in ER.  Past Medical History  Diagnosis Date  . Fibromyalgia   . Dyslipidemia   . Hypertension   . Hemorrhoids   . Arthritis   . OSA on CPAP   . History of diverticulitis of colon   . GERD (gastroesophageal reflux disease)   . History of breast cancer oncologist-  dr Humphrey Rolls--- no recurrence    dx 05-19-2012  Stage I , Invasive Lobular/ LCIS & microinvasive ductal and DCIS (T1 A NX M0)--  s/p  left partial mastectomy 07-04-2012 and radiation  . History of radiation therapy 08-10-2012 to 09-08-2012    Left breast 750 cGy in 3 sessions  . Prolapsed internal hemorrhoids, grade 3   . Varicose veins   . Cognitive dysfunction     DUE TO DEMENTIA--  PER NEUROLOGIST NOTE SEVERE  . Dementia with behavioral disturbance     PER NEUROLOGIST NOTE PT ABLE TO DO ADL'S BUT UNABLE TO CARE FOR HOME AND NEEDED AND PT HAS 24 HOUR CARE  . History of squamous cell carcinoma excision     NOSE  . Bilateral edema of lower extremity   . Anal fissure     Past Surgical History  Procedure Laterality Date  . Rectocele repair    . Esophageal dilation    . Cholecystectomy  2007  . Colonoscopy    . Breast lumpectomy with needle localization  07/04/2012    Procedure: BREAST LUMPECTOMY WITH NEEDLE LOCALIZATION;  Surgeon: Edward Jolly, MD;  Location: [place];  Service: General;  Laterality: Left;  left  . Varicose vein surgery  2010    left lower extremity  . Hemorrhoid banding  Mar & Apr 2016  . Total abdominal hysterectomy  1976  .  Hemorrhoid surgery N/A 12/25/2014    Procedure: HEMORRHOIDOPEXY WITH ANAL EXAM;  Surgeon: Leighton Ruff, MD;  Location: [place];  Service: General;  Laterality: N/A;    Family History  Problem Relation Age of Onset  . Cancer Mother 62    melanoma ca  . Arthritis Father   . Dementia Neg Hx     Social History:  reports that she has never smoked. She has never used smokeless tobacco. She reports that she does not drink alcohol or use illicit drugs.  Allergies:  Allergies  Allergen Reactions  . Penicillins Swelling and Rash  . Statins   . Sulfa Drugs Cross Reactors Shortness Of Breath  . Tramadol Hcl Anaphylaxis  . Aspirin Swelling  . Codeine Other (See Comments)    unknown  . Morphine And Related Other (See Comments)    Hypoglycemia    Medications: I have reviewed the patient's current medications.  Results for orders placed or performed during the hospital encounter of 01/06/15 (from the past 48 hour(s))  Lipase, blood     Status: Abnormal   Collection Time: 01/06/15  3:08 PM  Result Value Ref Range   Lipase 19 (L) 22 - 51 U/L  Comprehensive metabolic panel     Status: Abnormal  Collection Time: 01/06/15  3:08 PM  Result Value Ref Range   Sodium 133 (L) 135 - 145 mmol/L   Potassium 3.0 (L) 3.5 - 5.1 mmol/L   Chloride 98 (L) 101 - 111 mmol/L   CO2 24 22 - 32 mmol/L   Glucose, Bld 120 (H) 65 - 99 mg/dL   BUN 13 6 - 20 mg/dL   Creatinine, Ser 2.20 0.44 - 1.00 mg/dL   Calcium 9.2 8.9 - 77.1 mg/dL   Total Protein 7.4 6.5 - 8.1 g/dL   Albumin 3.6 3.5 - 5.0 g/dL   AST 32 15 - 41 U/L   ALT 20 14 - 54 U/L   Alkaline Phosphatase 58 38 - 126 U/L   Total Bilirubin 1.1 0.3 - 1.2 mg/dL   GFR calc non Af Amer 52 (L) >60 mL/min   GFR calc Af Amer 60 (L) >60 mL/min    Comment: (NOTE) The eGFR has been calculated using the CKD EPI equation. This calculation has not been validated in all clinical situations. eGFR's persistently <60 mL/min signify possible  Chronic Kidney Disease.    Anion gap 11 5 - 15  CBC     Status: Abnormal   Collection Time: 01/06/15  3:08 PM  Result Value Ref Range   WBC 9.1 4.0 - 10.5 K/uL   RBC 3.77 (L) 3.87 - 5.11 MIL/uL   Hemoglobin 11.8 (L) 12.0 - 15.0 g/dL   HCT 92.2 (L) 22.9 - 98.6 %   MCV 94.4 78.0 - 100.0 fL   MCH 31.3 26.0 - 34.0 pg   MCHC 33.1 30.0 - 36.0 g/dL   RDW 03.7 20.0 - 52.6 %   Platelets 458 (H) 150 - 400 K/uL  Urinalysis, Routine w reflex microscopic (not at [facility])     Status: Abnormal   Collection Time: 01/06/15  6:00 PM  Result Value Ref Range   Color, Urine YELLOW YELLOW   APPearance CLEAR CLEAR   Specific Gravity, Urine 1.003 (L) 1.005 - 1.030   pH 7.5 5.0 - 8.0   Glucose, UA NEGATIVE NEGATIVE mg/dL   Hgb urine dipstick NEGATIVE NEGATIVE   Bilirubin Urine NEGATIVE NEGATIVE   Ketones, ur NEGATIVE NEGATIVE mg/dL   Protein, ur NEGATIVE NEGATIVE mg/dL   Urobilinogen, UA 0.2 0.0 - 1.0 mg/dL   Nitrite NEGATIVE NEGATIVE   Leukocytes, UA NEGATIVE NEGATIVE    Comment: MICROSCOPIC NOT DONE ON URINES WITH NEGATIVE PROTEIN, BLOOD, LEUKOCYTES, NITRITE, OR GLUCOSE <1000 mg/dL.  Clostridium Difficile by PCR (not at [facility])     Status: None   Collection Time: 01/06/15  6:20 PM  Result Value Ref Range   C difficile by pcr NEGATIVE NEGATIVE  Magnesium     Status: Abnormal   Collection Time: 01/06/15  8:23 PM  Result Value Ref Range   Magnesium 1.4 (L) 1.7 - 2.4 mg/dL  Type and screen     Status: None (Preliminary result)   Collection Time: 01/06/15  8:24 PM  Result Value Ref Range   ABO/RH(D) O POS    Antibody Screen PENDING    Sample Expiration 01/09/2015     Ct Abdomen Pelvis W Contrast  01/06/2015   CLINICAL DATA:  Nausea, vomiting and diarrhea. Status post hemorrhoid surgery on 12/25/2014.  EXAM: CT ABDOMEN AND PELVIS WITH CONTRAST  TECHNIQUE: Multidetector CT imaging of the abdomen and pelvis was performed using the standard protocol following bolus administration of intravenous contrast.   CONTRAST:  OMNIPAQUE IOHEXOL 300 MG/ML  SOLN  COMPARISON:  12/05/2014.  FINDINGS: Small sliding hiatal hernia. Stable left renal cyst. Multiple colonic diverticula without evidence of diverticulitis. Normal appearing appendix.  There is an oval, mildly heterogeneous, medium to high density mass in the inferior, posterior left pelvis. This measures 9.1 x 6.7 cm on coronal image number 76 and 7.4 cm in width on axial image number 62. This has asymmetrical superior extension on the left, medial to the left ovary. No soft tissue gas or peripheral rim enhancement.  The uterus is surgically absent. Normal appearing ovaries, urinary bladder, ureters, right kidney, adrenal glands, pancreas and spleen. Mild diffuse low density of the liver relative to the spleen. No small bowel abnormalities.  The lungs are mildly hyperexpanded and clear at the lung bases. There is minimal linear density at both lung bases, compatible with scarring. Lumbar and lower thoracic spine degenerative changes. Atheromatous arterial calcifications.  IMPRESSION: 1. 9.1 x 7.4 x 6.7 cm left inferior, posterior pelvic mass not displacing the rectum to the right. This has appearance most compatible with a postoperative hematoma. 2. Colonic diverticulosis. 3. Small sliding hiatal hernia. 4. Mild diffuse hepatic steatosis. 5. Mild changes of COPD.   Electronically Signed   By: Claudie Revering M.D.   On: 01/06/2015 19:21    Review of Systems  Constitutional: Negative.   HENT: Negative.   Eyes: Negative.   Respiratory: Negative.   Cardiovascular: Negative.   Gastrointestinal: Positive for diarrhea.  Genitourinary: Negative.   Musculoskeletal: Negative.   Skin: Negative.   Neurological: Negative.   Endo/Heme/Allergies: Negative.    Blood pressure 130/49, pulse 74, temperature 97.7 F (36.5 C), temperature source Oral, resp. rate 18, height $RemoveBe'5\' 11"'WnxisjhCe$  (1.803 m), SpO2 98 %. Physical Exam  Constitutional: She appears well-developed and  well-nourished. No distress.  HENT:  Head: Normocephalic and atraumatic.  Right Ear: External ear normal.  Left Ear: External ear normal.  Eyes: Conjunctivae are normal. Pupils are equal, round, and reactive to light. No scleral icterus.  Neck: Normal range of motion. Neck supple. No tracheal deviation present. No thyromegaly present.  Cardiovascular: Normal rate, regular rhythm and normal heart sounds.   Respiratory: Effort normal and breath sounds normal. No respiratory distress.  GI: Soft. Bowel sounds are normal. She exhibits no distension and no mass. There is no tenderness. There is no rebound and no guarding.  Musculoskeletal: Normal range of motion. She exhibits no edema.  Neurological: She is alert.  Skin: Skin is warm and dry.    Assessment/Plan: Status post hemorrhoidopexy x 3 on 12/25/2014, now with left pelvic hematoma  Will ask Dr. Marcello Moores to follow up in AM 8/2  Likely no acute intervention necessary for hematoma at this point - no sign of infection or ongoing bleeding  Medical service to admit for diarrhea and mild dehydration  Earnstine Regal, MD, [facility] Office: [facility] 01/06/2015, 9:28 PM

## 2015-01-06 NOTE — ED Notes (Signed)
I in and out cathed patient, brown urine returned, RN Vickii Chafe, and MD Schlossman notified

## 2015-01-06 NOTE — ED Notes (Signed)
Patient taken to restroom by [facility] patient had combination urine and bowel movement-will I&O cath when PA Will has left room

## 2015-01-06 NOTE — ED Notes (Signed)
Patient had hemorrhoid surgery on 7-20 and has been complaining of N/V and diarrhea.  She went to her doctor's office and they said she had a bacteria infection.  [place] office prescribed Metronidazole 500 mg, but no relief.

## 2015-01-06 NOTE — ED Provider Notes (Signed)
CSN: [id number]     Arrival date & time 01/06/15  1410 History   First MD Initiated Contact with Patient 01/06/15 1538     Chief Complaint  Patient presents with  . Emesis   Kimberly Acevedo is a 81 y.o. female with a history of dementia, hemorrhoids, hypertension, fibromyalgia, and hysterectomy who presented to the emergency department with her full time caretaker complaining of diarrhea, nausea and vomiting for the past 10 days. The patient had a hemorrhoidectomy done by surgeon Dr. Leighton Ruff on 48/18/5631. Patient reports she's had rectal pain since. She also reports having diarrhea since then. Patient also complains of bilateral lower abdominal pain that she rates at a 5 out of 10. She reports having nausea and vomiting. She reports keeping down some food but not all of her food. She reports vomiting 2-3 times a day. She reports chills but no fevers. The patient's caretaker reports she went to her primary care doctor's office who reported that she had a bacterial infection in her stool and started her on metronidazole 500 mg without relief.  I question whether they were indicating c diff. Caretaker reports nonbloody, nonbilious vomiting. The patient denies fevers, urinary symptoms, hematuria, hematochezia, hematemesis, lightheadedness, dizziness, chest pain, shortness of breath, cough, wheezing or rashes. She has a history of a hysterectomy.   (Consider location/radiation/quality/duration/timing/severity/associated sxs/prior Treatment) HPI  Past Medical History  Diagnosis Date  . Fibromyalgia   . Dyslipidemia   . Hypertension   . Hemorrhoids   . Arthritis   . OSA on CPAP   . History of diverticulitis of colon   . GERD (gastroesophageal reflux disease)   . History of breast cancer oncologist-  dr Humphrey Rolls--- no recurrence    dx 05-19-2012  Stage I , Invasive Lobular/ LCIS & microinvasive ductal and DCIS (T1 A NX M0)--  s/p  left partial mastectomy 07-04-2012 and radiation  . History of  radiation therapy 08-10-2012 to 09-08-2012    Left breast 750 cGy in 3 sessions  . Prolapsed internal hemorrhoids, grade 3   . Varicose veins   . Cognitive dysfunction     DUE TO DEMENTIA--  PER NEUROLOGIST NOTE SEVERE  . Dementia with behavioral disturbance     PER NEUROLOGIST NOTE PT ABLE TO DO ADL'S BUT UNABLE TO CARE FOR HOME AND NEEDED AND PT HAS 24 HOUR CARE  . History of squamous cell carcinoma excision     NOSE  . Bilateral edema of lower extremity   . Anal fissure    Past Surgical History  Procedure Laterality Date  . Rectocele repair    . Esophageal dilation    . Cholecystectomy  2007  . Colonoscopy    . Breast lumpectomy with needle localization  07/04/2012    Procedure: BREAST LUMPECTOMY WITH NEEDLE LOCALIZATION;  Surgeon: Edward Jolly, MD;  Location: [place];  Service: General;  Laterality: Left;  left  . Varicose vein surgery  2010    left lower extremity  . Hemorrhoid banding  Mar & Apr 2016  . Total abdominal hysterectomy  1976  . Hemorrhoid surgery N/A 12/25/2014    Procedure: HEMORRHOIDOPEXY WITH ANAL EXAM;  Surgeon: Leighton Ruff, MD;  Location: [place];  Service: General;  Laterality: N/A;   Family History  Problem Relation Age of Onset  . Cancer Mother 19    melanoma ca  . Arthritis Father   . Dementia Neg Hx    History  Substance Use Topics  .  Smoking status: Never Smoker   . Smokeless tobacco: Never Used  . Alcohol Use: No   OB History    No data available     Review of Systems  Constitutional: Positive for chills. Negative for fever.  HENT: Negative for congestion and sore throat.   Eyes: Negative for visual disturbance.  Respiratory: Negative for cough, shortness of breath and wheezing.   Cardiovascular: Negative for chest pain and palpitations.  Gastrointestinal: Positive for nausea, vomiting, abdominal pain, diarrhea and abdominal distention. Negative for blood in stool.  Genitourinary: Negative  for dysuria, urgency, frequency, hematuria and difficulty urinating.  Musculoskeletal: Negative for back pain and neck pain.  Skin: Negative for rash.  Neurological: Negative for light-headedness and headaches.      Allergies  Penicillins; Statins; Sulfa drugs cross reactors; Tramadol hcl; Aspirin; Codeine; and Morphine and related  Home Medications   Prior to Admission medications   Medication Sig Start Date End Date Taking? Authorizing Provider  acetaminophen (TYLENOL) 325 MG tablet Take 650 mg by mouth every 6 (six) hours as needed.   Yes Historical Provider, MD  Cholecalciferol (VITAMIN D) 2000 UNITS CAPS Take 1 capsule by mouth daily.   Yes Historical Provider, MD  diltiazem 2 % GEL Apply 1 application topically 3 (three) times daily.   Yes Historical Provider, MD  doxycycline (VIBRA-TABS) 100 MG tablet Take 100 mg by mouth 2 (two) times daily.   Yes Historical Provider, MD  lidocaine (XYLOCAINE) 2 % jelly Apply 1 application topically as needed. Apply 4 times daily to the affected area. 12/19/14  Yes Amy S Esterwood, PA-C  loperamide (IMODIUM A-D) 2 MG tablet Take 2 mg by mouth 4 (four) times daily as needed for diarrhea or loose stools.   Yes Historical Provider, MD  losartan-hydrochlorothiazide (HYZAAR) 100-25 MG per tablet Take 1 tablet by mouth every morning.  01/11/14  Yes Historical Provider, MD  metroNIDAZOLE (FLAGYL) 500 MG tablet Take 500 mg by mouth every 8 (eight) hours. For 10 Days.   Yes Historical Provider, MD  oxyCODONE (OXY IR/ROXICODONE) 5 MG immediate release tablet Take 1 tablet (5 mg total) by mouth every 6 (six) hours as needed for severe pain. 0/93/81  Yes Leighton Ruff, MD  Probiotic Product (ALIGN) 4 MG CAPS Take 1 capsule by mouth daily.   Yes Historical Provider, MD  sertraline (ZOLOFT) 100 MG tablet Take 100 mg by mouth every morning.    Yes Historical Provider, MD  vitamin B-12 (CYANOCOBALAMIN) 1000 MCG tablet Take 1,000 mcg by mouth daily.   Yes Historical  Provider, MD  polyethylene glycol (MIRALAX / GLYCOLAX) packet Take 17 g by mouth daily as needed for mild constipation or moderate constipation. Use as directed until your bowel movements are soft and coming daily, then you may decrease to every other day to continue having daily soft BMs 05/22/14   Mercedes Camprubi-Soms, PA-C   BP 106/67 mmHg  Pulse 108  Temp(Src) 97.7 F (36.5 C) (Oral)  Resp 16  Ht 5\' 11"  (1.803 m)  SpO2 100% Physical Exam  Constitutional: She appears well-developed and well-nourished. No distress.  Nontoxic appearing.  HENT:  Head: Normocephalic and atraumatic.  Mouth/Throat: Oropharynx is clear and moist.  Eyes: Conjunctivae are normal. Pupils are equal, round, and reactive to light. Right eye exhibits no discharge. Left eye exhibits no discharge.  Neck: Neck supple. No JVD present.  Cardiovascular: Normal rate, regular rhythm, normal heart sounds and intact distal pulses.  Exam reveals no gallop and no friction rub.  No murmur heard. Pulmonary/Chest: Effort normal and breath sounds normal. No respiratory distress. She has no wheezes. She has no rales.  Lungs clear to auscultation bilaterally.  Abdominal: Soft. Bowel sounds are normal. She exhibits no mass. There is tenderness. There is no rebound and no guarding.  Abdomen is soft. Bowel sounds are present. Patient has mild generalized abdominal tenderness to palpation. No focal tenderness.   Genitourinary:  On GU exam the patient has several external hemorrhoids. No bleeding hemorrhoids. No signs of infection or discharge. There is an area of ecchymosis superior to her rectum that appears old and likely from her recent hemorrhoidectomy.  Musculoskeletal: She exhibits no edema or tenderness.  No lower extremity edema or tenderness.  Lymphadenopathy:    She has no cervical adenopathy.  Neurological: She is alert. Coordination normal.  Skin: Skin is warm and dry. No rash noted. She is not diaphoretic. No erythema.  No pallor.  Psychiatric: She has a normal mood and affect. Her behavior is normal.  Nursing note and vitals reviewed.   ED Course  Procedures (including critical care time) Labs Review Labs Reviewed  LIPASE, BLOOD - Abnormal; Notable for the following:    Lipase 19 (*)    All other components within normal limits  COMPREHENSIVE METABOLIC PANEL - Abnormal; Notable for the following:    Sodium 133 (*)    Potassium 3.0 (*)    Chloride 98 (*)    Glucose, Bld 120 (*)    GFR calc non Af Amer 52 (*)    GFR calc Af Amer 60 (*)    All other components within normal limits  CBC - Abnormal; Notable for the following:    RBC 3.77 (*)    Hemoglobin 11.8 (*)    HCT 35.6 (*)    Platelets 458 (*)    All other components within normal limits  URINALYSIS, ROUTINE W REFLEX MICROSCOPIC (NOT AT [facility]) - Abnormal; Notable for the following:    Specific Gravity, Urine 1.003 (*)    All other components within normal limits  MAGNESIUM - Abnormal; Notable for the following:    Magnesium 1.4 (*)    All other components within normal limits  CLOSTRIDIUM DIFFICILE BY PCR (NOT AT [facility])  STOOL CULTURE  MAGNESIUM  CBC  COMPREHENSIVE METABOLIC PANEL  CBC WITH DIFFERENTIAL/PLATELET  TYPE AND SCREEN    Imaging Review No results found.   EKG Interpretation None      Filed Vitals:   01/06/15 1427 01/06/15 1921 01/06/15 2321  BP: 106/67 130/49 97/43  Pulse: 108 74 69  Temp: 97.7 F (36.5 C)  98.4 F (36.9 C)  TempSrc: Oral  Oral  Resp: 16 18 16   Height: 5\' 11"  (1.803 m)    Weight:   161 lb 2.5 oz (73.1 kg)  SpO2: 100% 98% 99%     MDM   Meds given in ED:  Medications  potassium chloride 10 mEq in 100 mL IVPB (0 mEq Intravenous Stopped 01/06/15 2138)  sodium chloride 0.9 % 1,000 mL with potassium chloride 20 mEq infusion ( Intravenous New Bag/Given 01/06/15 2238)  oxyCODONE (Oxy IR/ROXICODONE) immediate release tablet 5 mg (not administered)  vitamin B-12 (CYANOCOBALAMIN) tablet 1,000 mcg  (not administered)  lidocaine (XYLOCAINE) 2 % jelly 1 application (not administered)  cholecalciferol (VITAMIN D) tablet 2,000 Units (not administered)  sertraline (ZOLOFT) tablet 100 mg (not administered)  losartan (COZAAR) tablet 100 mg (not administered)  acetaminophen (TYLENOL) tablet 650 mg (not administered)    Or  acetaminophen (TYLENOL) suppository 650 mg (not administered)  ondansetron (ZOFRAN) tablet 4 mg (not administered)    Or  ondansetron (ZOFRAN) injection 4 mg (not administered)  potassium chloride SA (K-DUR,KLOR-CON) CR tablet 40 mEq (not administered)  sodium chloride 0.9 % bolus 1,000 mL (0 mLs Intravenous Stopped 01/06/15 2018)  ondansetron (ZOFRAN) injection 4 mg (4 mg Intravenous Given 01/06/15 1649)  metroNIDAZOLE (FLAGYL) IVPB 500 mg (0 mg Intravenous Stopped 01/06/15 1747)  iohexol (OMNIPAQUE) 300 MG/ML solution 50 mL (50 mLs Oral Contrast Given 01/06/15 1626)  iohexol (OMNIPAQUE) 300 MG/ML solution 100 mL (100 mLs Intravenous Contrast Given 01/06/15 1854)  sodium chloride 0.9 % bolus 1,000 mL (1,000 mLs Intravenous New Bag/Given 01/06/15 2038)  magnesium sulfate IVPB 2 g 50 mL (2 g Intravenous New Bag/Given 01/06/15 2226)    New Prescriptions   No medications on file    Final diagnoses:  Nausea vomiting and diarrhea  Pelvic mass   This is a 81 y.o. female with a history of dementia, hemorrhoids, hypertension, fibromyalgia, and hysterectomy who presented to the emergency department with her full time caretaker complaining of diarrhea, nausea and vomiting for the past 10 days. The patient had a hemorrhoidectomy done by surgeon Dr. Leighton Ruff on 35/36/1443. Patient reports she's had rectal pain since. She also reports having diarrhea since then. Patient also complains of bilateral lower abdominal pain that she rates at a 5 out of 10. On exam the patient is afebrile and nontoxic appearing. Patient has generalized abdominal tenderness to palpation without focal tenderness. On  rectal exam the patient has several external hemorrhoids but no evidence of infection or cellulitis. Her urinalysis is nitrite and leukocyte negative. Lipase is 19. CMP indicates a potassium of 3.0. 2 Lines are given. CBC indicates a hemoglobin of 11.8 which is a reduction from her hemoglobin of 15 in June of this year. C diff by PCR is negative. CT abdomen and pelvis with contrast indicates 9.1 x 7.4 x 6.7 cm left inferior, posterior pelvic mass not displacing the rectum to the right. This has the appearance most compatible with a postoperative hematoma. I consulted with general surgeon Dr. Harlow Asa who reports he will admit the patient and would like medicine to consult. I consulted with Dr. Hal Hope who agrees to see the patient in consult with surgery. Plan is for the patient to see Dr. Marcello Moores in the am. The patient is in agreement with admission.   This patient was discussed with and evaluated by Dr. Billy Fischer who agrees with assessment and plan.   Waynetta Pean, PA-C 01/07/15 0206  Gareth Morgan, MD 01/07/15 1317  Gareth Morgan, MD 01/07/15 1319

## 2015-01-07 DIAGNOSIS — E876 Hypokalemia: Secondary | ICD-10-CM

## 2015-01-07 DIAGNOSIS — R112 Nausea with vomiting, unspecified: Secondary | ICD-10-CM

## 2015-01-07 DIAGNOSIS — R197 Diarrhea, unspecified: Secondary | ICD-10-CM

## 2015-01-07 DIAGNOSIS — S3690XA Unspecified injury of unspecified intra-abdominal organ, initial encounter: Secondary | ICD-10-CM

## 2015-01-07 DIAGNOSIS — F0390 Unspecified dementia without behavioral disturbance: Secondary | ICD-10-CM

## 2015-01-07 LAB — CBC WITH DIFFERENTIAL/PLATELET
BASOS ABS: 0 10*3/uL (ref 0.0–0.1)
BASOS PCT: 0 % (ref 0–1)
EOS PCT: 1 % (ref 0–5)
Eosinophils Absolute: 0.1 10*3/uL (ref 0.0–0.7)
HCT: 30.9 % — ABNORMAL LOW (ref 36.0–46.0)
HEMOGLOBIN: 10.3 g/dL — AB (ref 12.0–15.0)
LYMPHS ABS: 2.1 10*3/uL (ref 0.7–4.0)
LYMPHS PCT: 28 % (ref 12–46)
MCH: 31.7 pg (ref 26.0–34.0)
MCHC: 33.3 g/dL (ref 30.0–36.0)
MCV: 95.1 fL (ref 78.0–100.0)
Monocytes Absolute: 0.7 10*3/uL (ref 0.1–1.0)
Monocytes Relative: 9 % (ref 3–12)
Neutro Abs: 4.8 10*3/uL (ref 1.7–7.7)
Neutrophils Relative %: 62 % (ref 43–77)
PLATELETS: 370 10*3/uL (ref 150–400)
RBC: 3.25 MIL/uL — AB (ref 3.87–5.11)
RDW: 13.6 % (ref 11.5–15.5)
WBC: 7.7 10*3/uL (ref 4.0–10.5)

## 2015-01-07 LAB — COMPREHENSIVE METABOLIC PANEL
ALK PHOS: 47 U/L (ref 38–126)
ALT: 16 U/L (ref 14–54)
ANION GAP: 6 (ref 5–15)
AST: 27 U/L (ref 15–41)
Albumin: 2.9 g/dL — ABNORMAL LOW (ref 3.5–5.0)
BUN: 10 mg/dL (ref 6–20)
CHLORIDE: 107 mmol/L (ref 101–111)
CO2: 25 mmol/L (ref 22–32)
Calcium: 8.3 mg/dL — ABNORMAL LOW (ref 8.9–10.3)
Creatinine, Ser: 0.78 mg/dL (ref 0.44–1.00)
GFR calc Af Amer: >60 mL/min (ref >60)
Glucose, Bld: 107 mg/dL — ABNORMAL HIGH (ref 65–99)
Potassium: 3.7 mmol/L (ref 3.5–5.1)
SODIUM: 138 mmol/L (ref 135–145)
Total Bilirubin: 0.8 mg/dL (ref 0.3–1.2)
Total Protein: 6.1 g/dL — ABNORMAL LOW (ref 6.5–8.1)

## 2015-01-07 LAB — MAGNESIUM: Magnesium: 2 mg/dL (ref 1.7–2.4)

## 2015-01-07 MED ORDER — LOSARTAN POTASSIUM 50 MG PO TABS
50.0000 mg | ORAL_TABLET | Freq: Every day | ORAL | Status: DC
  Administered 2015-01-08 – 2015-01-10 (×3): 50 mg via ORAL
  Filled 2015-01-07 (×3): qty 1

## 2015-01-07 MED ORDER — LORAZEPAM 2 MG/ML IJ SOLN
1.0000 mg | Freq: Once | INTRAMUSCULAR | Status: AC
  Administered 2015-01-07: 1 mg via INTRAVENOUS
  Filled 2015-01-07: qty 1

## 2015-01-07 NOTE — Progress Notes (Signed)
PROGRESS NOTE  Kimberly Acevedo:[id number] DOB: [birth year] DOA: 01/06/2015 PCP: [name], MD  HPI/Recap of past 24 hours:  Denies pain ,very pleasant, still report watery diarrhea, denies vomiting on clear liquid diet  Assessment/Plan: Principal Problem:   Nausea vomiting and diarrhea Active Problems:   Hypertension   Hypokalemia   Dementia   Intra-abdominal hematoma   Normocytic normochromic anemia  1. Nausea vomiting and diarrhea - stool for C. difficile has been negative. GI pathogens by pcr pending. Continue with IV hydration and replace electrolytes. 2. Abdominal hematoma with recent surgery - appreciate general surgery input, no indication for surgery, denies ab pain  or distention. Follow CBC. 3. Hypertension - hold HCTZ. decrease losartan due to low normal bp 4. Hypokalemia and hypomagnesemia - probably from diarrhea  And hctz, Replace and recheck. 5. Dementia - no acute issues. Pleasantly demented, oriented to person only.  Code Status: DNR  Family Communication: patient and a friend in room  Disposition Plan: remain in the hospital   Consultants:  General surgery  Procedures:  none  Antibiotics:  none   Objective: BP 135/50 mmHg  Pulse 80  Temp(Src) 97.9 F (36.6 C) (Oral)  Resp 16  Ht 5\' 11"  (1.803 m)  Wt 73.1 kg (161 lb 2.5 oz)  BMI 22.49 kg/m2  SpO2 100%  Intake/Output Summary (Last 24 hours) at 01/07/15 1256 Last data filed at 01/07/15 1034  Gross per 24 hour  Intake      0 ml  Output   1800 ml  Net  -1800 ml   Filed Weights   01/06/15 2321  Weight: 73.1 kg (161 lb 2.5 oz)    Exam:   General:  NAD  Cardiovascular: RRR  Respiratory: CTABL  Abdomen: Soft/ND/NT, positive BS  Musculoskeletal: No Edema  Neuro: baseline dementia  Data Reviewed: Basic Metabolic Panel:  Recent Labs Lab 01/06/15 1508 01/06/15 2023 01/07/15 0406  NA 133*  --  138  K 3.0*  --  3.7  CL 98*  --  107  CO2 24  --  25    GLUCOSE 120*  --  107*  BUN 13  --  10  CREATININE 0.98  --  0.78  CALCIUM 9.2  --  8.3*  MG  --  1.4* 2.0   Liver Function Tests:  Recent Labs Lab 01/06/15 1508 01/07/15 0406  AST 32 27  ALT 20 16  ALKPHOS 58 47  BILITOT 1.1 0.8  PROT 7.4 6.1*  ALBUMIN 3.6 2.9*    Recent Labs Lab 01/06/15 1508  LIPASE 19*   No results for input(s): AMMONIA in the last 168 hours. CBC:  Recent Labs Lab 01/06/15 1508 01/07/15 0406  WBC 9.1 7.7  NEUTROABS  --  4.8  HGB 11.8* 10.3*  HCT 35.6* 30.9*  MCV 94.4 95.1  PLT 458* 370   Cardiac Enzymes:   No results for input(s): CKTOTAL, CKMB, CKMBINDEX, TROPONINI in the last 168 hours. BNP (last 3 results) No results for input(s): BNP in the last 8760 hours.  ProBNP (last 3 results) No results for input(s): PROBNP in the last 8760 hours.  CBG: No results for input(s): GLUCAP in the last 168 hours.  Recent Results (from the past 240 hour(s))  Stool culture     Status: None (Preliminary result)   Collection Time: 01/06/15  5:51 PM  Result Value Ref Range Status   Specimen Description STOOL  Final   Special Requests NONE  Final   Culture  Final    Culture reincubated for better growth Performed at [facility]    Report Status PENDING  Incomplete  Clostridium Difficile by PCR (not at [facility])     Status: None   Collection Time: 01/06/15  6:20 PM  Result Value Ref Range Status   Toxigenic C Difficile by pcr NEGATIVE NEGATIVE Final     Studies: Ct Abdomen Pelvis W Contrast  01/06/2015   CLINICAL DATA:  Nausea, vomiting and diarrhea. Status post hemorrhoid surgery on 12/25/2014.  EXAM: CT ABDOMEN AND PELVIS WITH CONTRAST  TECHNIQUE: Multidetector CT imaging of the abdomen and pelvis was performed using the standard protocol following bolus administration of intravenous contrast.  CONTRAST:  138mL OMNIPAQUE IOHEXOL 300 MG/ML  SOLN  COMPARISON:  12/05/2014.  FINDINGS: Small sliding hiatal hernia. Stable left renal cyst.  Multiple colonic diverticula without evidence of diverticulitis. Normal appearing appendix.  There is an oval, mildly heterogeneous, medium to high density mass in the inferior, posterior left pelvis. This measures 9.1 x 6.7 cm on coronal image number 76 and 7.4 cm in width on axial image number 62. This has asymmetrical superior extension on the left, medial to the left ovary. No soft tissue gas or peripheral rim enhancement.  The uterus is surgically absent. Normal appearing ovaries, urinary bladder, ureters, right kidney, adrenal glands, pancreas and spleen. Mild diffuse low density of the liver relative to the spleen. No small bowel abnormalities.  The lungs are mildly hyperexpanded and clear at the lung bases. There is minimal linear density at both lung bases, compatible with scarring. Lumbar and lower thoracic spine degenerative changes. Atheromatous arterial calcifications.  IMPRESSION: 1. 9.1 x 7.4 x 6.7 cm left inferior, posterior pelvic mass not displacing the rectum to the right. This has appearance most compatible with a postoperative hematoma. 2. Colonic diverticulosis. 3. Small sliding hiatal hernia. 4. Mild diffuse hepatic steatosis. 5. Mild changes of COPD.   Electronically Signed   By: Claudie Revering M.D.   On: 01/06/2015 19:21    Scheduled Meds: . cholecalciferol  2,000 Units Oral Daily  . losartan  100 mg Oral Daily  . sertraline  100 mg Oral q morning - 10a  . vitamin B-12  1,000 mcg Oral Daily    Continuous Infusions: . sodium chloride 0.9 % 1,000 mL with potassium chloride 20 mEq infusion 75 mL/hr at 01/07/15 1229     Time spent: 5mins  Holley Kocurek MD, PhD  [facility] Pager [phone]. If 7PM-7AM, please contact night-coverage at [email or web address], password [facility] 01/07/2015, 12:56 PM  LOS: 1 day

## 2015-01-07 NOTE — Progress Notes (Signed)
Patient ID: Kimberly Acevedo, female   DOB: 1929/02/24, 81 y.o.   MRN: [id number]    Subjective: Pt with some nausea.  1 episode of semi-solid stool since admission.  Doesn't feel well, but asking when she can go home.  Objective: Vital signs in last 24 hours: Temp:  [97.7 F (36.5 C)-98.4 F (36.9 C)] 97.9 F (36.6 C) (08/02 0700) Pulse Rate:  [69-108] 80 (08/02 0700) Resp:  [16-18] 16 (08/02 0700) BP: (97-135)/(43-67) 135/50 mmHg (08/02 0700) SpO2:  [98 %-100 %] 100 % (08/02 0700) Weight:  [73.1 kg (161 lb 2.5 oz)] 73.1 kg (161 lb 2.5 oz) (08/01 2321)    Intake/Output from previous day: 08/01 0701 - 08/02 0700 In: -  Out: 1250 [Urine:1250] Intake/Output this shift:    PE: Abd: tender in LLQ, otherwise unremarkable, +BS Heart: regular  Lab Results:   Recent Labs  01/06/15 1508 01/07/15 0406  WBC 9.1 7.7  HGB 11.8* 10.3*  HCT 35.6* 30.9*  PLT 458* 370   BMET  Recent Labs  01/06/15 1508 01/07/15 0406  NA 133* 138  K 3.0* 3.7  CL 98* 107  CO2 24 25  GLUCOSE 120* 107*  BUN 13 10  CREATININE 0.98 0.78  CALCIUM 9.2 8.3*   PT/INR No results for input(s): LABPROT, INR in the last 72 hours. CMP     Component Value Date/Time   NA 138 01/07/2015 0406   NA 143 11/22/2012 1316   K 3.7 01/07/2015 0406   K 3.7 11/22/2012 1316   CL 107 01/07/2015 0406   CL 103 11/22/2012 1316   CO2 25 01/07/2015 0406   CO2 29 11/22/2012 1316   GLUCOSE 107* 01/07/2015 0406   GLUCOSE 133* 11/22/2012 1316   BUN 10 01/07/2015 0406   BUN 8.7 11/22/2012 1316   CREATININE 0.78 01/07/2015 0406   CREATININE 0.8 11/22/2012 1316   CALCIUM 8.3* 01/07/2015 0406   CALCIUM 10.3 11/22/2012 1316   PROT 6.1* 01/07/2015 0406   PROT 7.3 11/22/2012 1316   ALBUMIN 2.9* 01/07/2015 0406   ALBUMIN 3.5 11/22/2012 1316   AST 27 01/07/2015 0406   AST 17 11/22/2012 1316   ALT 16 01/07/2015 0406   ALT 15 11/22/2012 1316   ALKPHOS 47 01/07/2015 0406   ALKPHOS 100 11/22/2012 1316   BILITOT 0.8  01/07/2015 0406   BILITOT 0.39 11/22/2012 1316   GFRNONAA >60 01/07/2015 0406   GFRAA >60 01/07/2015 0406   Lipase     Component Value Date/Time   LIPASE 19* 01/06/2015 1508       Studies/Results: Ct Abdomen Pelvis W Contrast  01/06/2015   CLINICAL DATA:  Nausea, vomiting and diarrhea. Status post hemorrhoid surgery on 12/25/2014.  EXAM: CT ABDOMEN AND PELVIS WITH CONTRAST  TECHNIQUE: Multidetector CT imaging of the abdomen and pelvis was performed using the standard protocol following bolus administration of intravenous contrast.  CONTRAST:  143mL OMNIPAQUE IOHEXOL 300 MG/ML  SOLN  COMPARISON:  12/05/2014.  FINDINGS: Small sliding hiatal hernia. Stable left renal cyst. Multiple colonic diverticula without evidence of diverticulitis. Normal appearing appendix.  There is an oval, mildly heterogeneous, medium to high density mass in the inferior, posterior left pelvis. This measures 9.1 x 6.7 cm on coronal image number 76 and 7.4 cm in width on axial image number 62. This has asymmetrical superior extension on the left, medial to the left ovary. No soft tissue gas or peripheral rim enhancement.  The uterus is surgically absent. Normal appearing ovaries, urinary bladder, ureters, right  kidney, adrenal glands, pancreas and spleen. Mild diffuse low density of the liver relative to the spleen. No small bowel abnormalities.  The lungs are mildly hyperexpanded and clear at the lung bases. There is minimal linear density at both lung bases, compatible with scarring. Lumbar and lower thoracic spine degenerative changes. Atheromatous arterial calcifications.  IMPRESSION: 1. 9.1 x 7.4 x 6.7 cm left inferior, posterior pelvic mass not displacing the rectum to the right. This has appearance most compatible with a postoperative hematoma. 2. Colonic diverticulosis. 3. Small sliding hiatal hernia. 4. Mild diffuse hepatic steatosis. 5. Mild changes of COPD.   Electronically Signed   By: Claudie Revering M.D.   On:  01/06/2015 19:21    Anti-infectives: Anti-infectives    Start     Dose/Rate Route Frequency Ordered Stop   01/06/15 1630  metroNIDAZOLE (FLAGYL) IVPB 500 mg     500 mg 100 mL/hr over 60 Minutes Intravenous  Once 01/06/15 1615 01/06/15 1747       Assessment/Plan   Status post hemorrhoidopexy x 3 on 12/25/2014, now with left pelvic hematoma, by Dr. Joyice Faster -no acute surgical intervention needed currently for hematoma.  This will resolve on it's own.  hgb is down slightly, but likely dilutional. -nausea and diarrhea likely unrelated to surgery -Dr. Marcello Moores to likely follow.  LOS: 1 day    Decklan Mau E 01/07/2015, 8:20 AM Pager: [phone]

## 2015-01-08 LAB — MAGNESIUM: Magnesium: 1.8 mg/dL (ref 1.7–2.4)

## 2015-01-08 LAB — BASIC METABOLIC PANEL
Anion gap: 4 — ABNORMAL LOW (ref 5–15)
BUN: 6 mg/dL (ref 6–20)
CO2: 25 mmol/L (ref 22–32)
CREATININE: 0.77 mg/dL (ref 0.44–1.00)
Calcium: 8.2 mg/dL — ABNORMAL LOW (ref 8.9–10.3)
Chloride: 109 mmol/L (ref 101–111)
GFR calc Af Amer: >60 mL/min (ref >60)
GLUCOSE: 108 mg/dL — AB (ref 65–99)
Potassium: 3.9 mmol/L (ref 3.5–5.1)
Sodium: 138 mmol/L (ref 135–145)

## 2015-01-08 MED ORDER — SODIUM CHLORIDE 0.9 % IV SOLN
INTRAVENOUS | Status: AC
  Administered 2015-01-08: 11:00:00 via INTRAVENOUS
  Administered 2015-01-09: 1000 mL via INTRAVENOUS

## 2015-01-08 NOTE — Progress Notes (Signed)
Chaplain referred by CSW.  Daughter of patient requested Advance Directives. Chaplain and care giver assisted patient with Advance Directives.   01/08/15 1600  Clinical Encounter Type  Visited With Patient;Other (Comment)  Visit Type Initial;Spiritual support;Social support  Referral From Social work

## 2015-01-08 NOTE — Progress Notes (Signed)
CSW received referral for Advanced Directives.  CSW notified [place] of referral and Chaplain planned to follow up to address Advanced Directives.   No social work needs identified at this time.  CSW signing off.   Alison Murray, MSW, [facility] [phone]

## 2015-01-08 NOTE — Evaluation (Signed)
Physical Therapy Evaluation Patient Details Name: Kimberly Acevedo MRN: [id number] DOB: [birth year] Today's Date: 01/08/2015   History of Present Illness  81 yo female admitted with s/p hemorrhoidopexy, N/V/D. Hx of dementia, HTN, fibromyalgia. Pt is from home with caregivers.  Clinical Impression  On eval, pt required Min assist for mobility-able to ambulate ~15'x2 with RW/no assistive device. Pt is weak and unsteady. Fatigued with minimal ambulation distance.     Follow Up Recommendations Home health PT;Supervision/Assistance - 24 hour (if family/caregiver agreeable)    Equipment Recommendations  None recommended by PT    Recommendations for Other Services       Precautions / Restrictions Precautions Precautions: Fall Restrictions Weight Bearing Restrictions: No      Mobility  Bed Mobility Overal bed mobility: Needs Assistance Bed Mobility: Supine to Sit     Supine to sit: Min assist     General bed mobility comments: Assist for trunk to upright  Transfers Overall transfer level: Needs assistance   Transfers: Sit to/from Stand Sit to Stand: Min assist         General transfer comment: assist to steady  Ambulation/Gait Ambulation/Gait assistance: Min assist Ambulation Distance (Feet): 15 Feet (x2) Assistive device: Rolling Edberg (2 wheeled);None Gait Pattern/deviations: Step-through pattern     General Gait Details: unsteady, weak. pt was fatigued with minimal ambulation distance. Pt declined ambulation in hallway on today.   Stairs            Wheelchair Mobility    Modified Rankin (Stroke Patients Only)       Balance Overall balance assessment: Needs assistance         Standing balance support: During functional activity Standing balance-Leahy Scale: Fair                               Pertinent Vitals/Pain Pain Assessment: Faces Faces Pain Scale: Hurts little more Pain Location: bottome Pain Descriptors / Indicators:  Sore Pain Intervention(s): Monitored during session    Home Living Family/patient expects to be discharged to:: Private residence Living Arrangements: Alone Available Help at Discharge: Available 24 hours/day             Additional Comments: unable to get full PLOF info-caregiver on phone entire session    Prior Function Level of Independence: Independent               Hand Dominance        Extremity/Trunk Assessment   Upper Extremity Assessment: Generalized weakness           Lower Extremity Assessment: Generalized weakness      Cervical / Trunk Assessment: Normal  Communication   Communication: No difficulties  Cognition Arousal/Alertness: Awake/alert Behavior During Therapy: WFL for tasks assessed/performed Overall Cognitive Status: History of cognitive impairments - at baseline                      General Comments      Exercises        Assessment/Plan    PT Assessment Patient needs continued PT services  PT Diagnosis Difficulty walking;Generalized weakness;Acute pain   PT Problem List Decreased strength;Decreased activity tolerance;Decreased balance;Decreased mobility;Pain;Decreased cognition  PT Treatment Interventions DME instruction;Gait training;Functional mobility training;Therapeutic activities;Patient/family education;Balance training;Therapeutic exercise   PT Goals (Current goals can be found in the Care Plan section) Acute Rehab PT Goals Patient Stated Goal: to feel better PT Goal Formulation: Patient unable to participate in  goal setting Time For Goal Achievement: 01/22/15 Potential to Achieve Goals: Good    Frequency Min 3X/week   Barriers to discharge        Co-evaluation               End of Session   Activity Tolerance: Patient limited by fatigue Patient left: in bed;with call bell/phone within reach;with bed alarm set;with family/visitor present           Time: 0131-4388 PT Time Calculation  (min) (ACUTE ONLY): 19 min   Charges:   PT Evaluation $Initial PT Evaluation Tier I: 1 Procedure     PT G Codes:        [name], MPT Pager: [phone]

## 2015-01-08 NOTE — Progress Notes (Signed)
PROGRESS NOTE  Kimberly Acevedo KNL:[id number] DOB: 1928/11/16 DOA: 01/06/2015 PCP: [name], MD  HPI/Recap of past 24 hours:  Feeling better,  Diarrhea slowed down, denies nausea, no vomiting  Assessment/Plan: Principal Problem:   Nausea vomiting and diarrhea Active Problems:   Hypertension   Hypokalemia   Dementia   Intra-abdominal hematoma   Normocytic normochromic anemia  1. Nausea vomiting and diarrhea - stool for C. difficile has been negative. GI pathogens by pcr pending. Continue with IV hydration and replace electrolytes. 2. Abdominal hematoma with recent surgery - appreciate general surgery input, no indication for surgery, denies ab pain  or distention. Follow CBC. 3. Hypertension - hold HCTZ. decrease losartan due to low normal bp 4. Hypokalemia and hypomagnesemia - probably from diarrhea  And hctz, Replace and recheck. 5. Dementia - no acute issues. Pleasantly demented, oriented to person only.  Code Status: DNR  Family Communication: patient and personal sitter in room  Disposition Plan: remain in the hospital, likely d/c in am pending GI pathogen result   Consultants:  General surgery  Procedures:  none  Antibiotics:  none   Objective: BP 132/50 mmHg  Pulse 73  Temp(Src) 97.4 F (36.3 C) (Oral)  Resp 16  Ht 5\' 11"  (1.803 m)  Wt 73.1 kg (161 lb 2.5 oz)  BMI 22.49 kg/m2  SpO2 97%  Intake/Output Summary (Last 24 hours) at 01/08/15 1104 Last data filed at 01/08/15 0400  Gross per 24 hour  Intake    120 ml  Output   1000 ml  Net   -880 ml   Filed Weights   01/06/15 2321  Weight: 73.1 kg (161 lb 2.5 oz)    Exam:   General:  NAD  Cardiovascular: RRR  Respiratory: CTABL  Abdomen: Soft/ND/NT, positive BS  Musculoskeletal: No Edema  Neuro: baseline dementia  Data Reviewed: Basic Metabolic Panel:  Recent Labs Lab 01/06/15 1508 01/06/15 2023 01/07/15 0406 01/08/15 0403  NA 133*  --  138 138  K 3.0*  --  3.7  3.9  CL 98*  --  107 109  CO2 24  --  25 25  GLUCOSE 120*  --  107* 108*  BUN 13  --  10 6  CREATININE 0.98  --  0.78 0.77  CALCIUM 9.2  --  8.3* 8.2*  MG  --  1.4* 2.0 1.8   Liver Function Tests:  Recent Labs Lab 01/06/15 1508 01/07/15 0406  AST 32 27  ALT 20 16  ALKPHOS 58 47  BILITOT 1.1 0.8  PROT 7.4 6.1*  ALBUMIN 3.6 2.9*    Recent Labs Lab 01/06/15 1508  LIPASE 19*   No results for input(s): AMMONIA in the last 168 hours. CBC:  Recent Labs Lab 01/06/15 1508 01/07/15 0406  WBC 9.1 7.7  NEUTROABS  --  4.8  HGB 11.8* 10.3*  HCT 35.6* 30.9*  MCV 94.4 95.1  PLT 458* 370   Cardiac Enzymes:   No results for input(s): CKTOTAL, CKMB, CKMBINDEX, TROPONINI in the last 168 hours. BNP (last 3 results) No results for input(s): BNP in the last 8760 hours.  ProBNP (last 3 results) No results for input(s): PROBNP in the last 8760 hours.  CBG: No results for input(s): GLUCAP in the last 168 hours.  Recent Results (from the past 240 hour(s))  Stool culture     Status: None (Preliminary result)   Collection Time: 01/06/15  5:51 PM  Result Value Ref Range Status   Specimen Description STOOL  Final   Special Requests NONE  Final   Culture   Final    Culture reincubated for better growth Performed at [facility]    Report Status PENDING  Incomplete  Clostridium Difficile by PCR (not at [facility])     Status: None   Collection Time: 01/06/15  6:20 PM  Result Value Ref Range Status   Toxigenic C Difficile by pcr NEGATIVE NEGATIVE Final     Studies: No results found.  Scheduled Meds: . cholecalciferol  2,000 Units Oral Daily  . losartan  50 mg Oral Daily  . sertraline  100 mg Oral q morning - 10a  . vitamin B-12  1,000 mcg Oral Daily    Continuous Infusions: . sodium chloride 75 mL/hr at 01/08/15 1038     Time spent: 40mins  Si Jachim MD, PhD  [facility] Pager [phone]. If 7PM-7AM, please contact night-coverage at [email or web address], password  [facility] 01/08/2015, 11:04 AM  LOS: 2 days

## 2015-01-08 NOTE — Care Management Important Message (Signed)
Important Message  Patient Details Name: REBECCA CAIRNS IM Letter given Janett Billow tp present to patientImportant Message  Patient Details  Name: DEKOTA KIRLIN MRN: [id number] Date of Birth: 05/30/1929   Medicare Important Message Given:  [facility] notification given    Camillo Flaming 01/08/2015, 12:50 PM MRN: [id number] Date of Birth: 02-09-1929   Medicare Important Message Given:  Yes-second notification given    Camillo Flaming 01/08/2015, 12:49 PM

## 2015-01-09 LAB — BASIC METABOLIC PANEL
ANION GAP: 7 (ref 5–15)
BUN: 7 mg/dL (ref 6–20)
CHLORIDE: 109 mmol/L (ref 101–111)
CO2: 26 mmol/L (ref 22–32)
Calcium: 8.5 mg/dL — ABNORMAL LOW (ref 8.9–10.3)
Creatinine, Ser: 0.77 mg/dL (ref 0.44–1.00)
GFR calc Af Amer: >60 mL/min (ref >60)
Glucose, Bld: 105 mg/dL — ABNORMAL HIGH (ref 65–99)
POTASSIUM: 4.1 mmol/L (ref 3.5–5.1)
Sodium: 142 mmol/L (ref 135–145)

## 2015-01-09 LAB — CBC
HCT: 30 % — ABNORMAL LOW (ref 36.0–46.0)
HEMOGLOBIN: 9.6 g/dL — AB (ref 12.0–15.0)
MCH: 31.5 pg (ref 26.0–34.0)
MCHC: 32 g/dL (ref 30.0–36.0)
MCV: 98.4 fL (ref 78.0–100.0)
Platelets: 341 10*3/uL (ref 150–400)
RBC: 3.05 MIL/uL — ABNORMAL LOW (ref 3.87–5.11)
RDW: 14 % (ref 11.5–15.5)
WBC: 5.7 10*3/uL (ref 4.0–10.5)

## 2015-01-09 LAB — MAGNESIUM: MAGNESIUM: 1.7 mg/dL (ref 1.7–2.4)

## 2015-01-09 NOTE — Progress Notes (Signed)
PROGRESS NOTE  Kimberly Acevedo OMV:[id number] DOB: [birth year] DOA: 01/06/2015 PCP: [name], MD  HPI/Recap of past 24 hours:   denies nausea, no vomiting, tolerating diet, per caregiver, patient still has watery diarrhea, patient still report " sore" in her abdomen.  Assessment/Plan: Principal Problem:   Nausea vomiting and diarrhea Active Problems:   Hypertension   Hypokalemia   Dementia   Intra-abdominal hematoma   Normocytic normochromic anemia  1. Nausea vomiting and diarrhea - stool for C. difficile has been negative. GI pathogens by pcr pending. Continue with IV hydration and replace electrolytes. N/V has resolved, persistent diarrhea 2. Abdominal hematoma with recent surgery - appreciate general surgery input, no indication for surgery, reported lower abdominal being "sore" since hemorrhoids surgery, no distention. Follow CBC. 3. Hypertension - hold HCTZ. decrease losartan due to low normal bp. 4. Hypokalemia and hypomagnesemia - probably from diarrhea  And hctz, Replace and recheck. better 5. Dementia - no acute issues. Pleasantly demented, oriented to person only.  Code Status: DNR  Family Communication: patient and personal sitter in room  Disposition Plan: remain in the hospital, likely d/c in am pending GI pathogen result   Consultants:  General surgery  Procedures:  none  Antibiotics:  none   Objective: BP 124/58 mmHg  Pulse 70  Temp(Src) 98.2 F (36.8 C) (Oral)  Resp 18  Ht 5\' 11"  (1.803 m)  Wt 73.1 kg (161 lb 2.5 oz)  BMI 22.49 kg/m2  SpO2 96%  Intake/Output Summary (Last 24 hours) at 01/09/15 1113 Last data filed at 01/09/15 0515  Gross per 24 hour  Intake   1554 ml  Output    500 ml  Net   1054 ml   Filed Weights   01/06/15 2321  Weight: 73.1 kg (161 lb 2.5 oz)    Exam:   General:  NAD  Cardiovascular: RRR  Respiratory: CTABL  Abdomen: Soft/ND/NT, positive BS  Musculoskeletal: No Edema  Neuro: baseline  dementia  Data Reviewed: Basic Metabolic Panel:  Recent Labs Lab 01/06/15 1508 01/06/15 2023 01/07/15 0406 01/08/15 0403 01/09/15 0405  NA 133*  --  138 138 142  K 3.0*  --  3.7 3.9 4.1  CL 98*  --  107 109 109  CO2 24  --  25 25 26   GLUCOSE 120*  --  107* 108* 105*  BUN 13  --  10 6 7   CREATININE 0.98  --  0.78 0.77 0.77  CALCIUM 9.2  --  8.3* 8.2* 8.5*  MG  --  1.4* 2.0 1.8 1.7   Liver Function Tests:  Recent Labs Lab 01/06/15 1508 01/07/15 0406  AST 32 27  ALT 20 16  ALKPHOS 58 47  BILITOT 1.1 0.8  PROT 7.4 6.1*  ALBUMIN 3.6 2.9*    Recent Labs Lab 01/06/15 1508  LIPASE 19*   No results for input(s): AMMONIA in the last 168 hours. CBC:  Recent Labs Lab 01/06/15 1508 01/07/15 0406 01/09/15 0405  WBC 9.1 7.7 5.7  NEUTROABS  --  4.8  --   HGB 11.8* 10.3* 9.6*  HCT 35.6* 30.9* 30.0*  MCV 94.4 95.1 98.4  PLT 458* 370 341   Cardiac Enzymes:   No results for input(s): CKTOTAL, CKMB, CKMBINDEX, TROPONINI in the last 168 hours. BNP (last 3 results) No results for input(s): BNP in the last 8760 hours.  ProBNP (last 3 results) No results for input(s): PROBNP in the last 8760 hours.  CBG: No results for input(s): GLUCAP in  the last 168 hours.  Recent Results (from the past 240 hour(s))  Stool culture     Status: None (Preliminary result)   Collection Time: 01/06/15  5:51 PM  Result Value Ref Range Status   Specimen Description STOOL  Final   Special Requests NONE  Final   Culture   Final    NO SUSPICIOUS COLONIES, CONTINUING TO HOLD Performed at [facility]    Report Status PENDING  Incomplete  Clostridium Difficile by PCR (not at [facility])     Status: None   Collection Time: 01/06/15  6:20 PM  Result Value Ref Range Status   Toxigenic C Difficile by pcr NEGATIVE NEGATIVE Final     Studies: No results found.  Scheduled Meds: . cholecalciferol  2,000 Units Oral Daily  . losartan  50 mg Oral Daily  . sertraline  100 mg Oral q morning  - 10a  . vitamin B-12  1,000 mcg Oral Daily    Continuous Infusions:     Time spent: 55mins  Kimberly Schlag MD, PhD  [facility] Pager [phone]. If 7PM-7AM, please contact night-coverage at [email or web address], password [facility] 01/09/2015, 11:13 AM  LOS: 3 days

## 2015-01-09 NOTE — Progress Notes (Signed)
Physical Therapy Treatment Patient Details Name: KEYAIRA CLAPHAM MRN: [id number] DOB: September 21, 1928 Today's Date: 01/09/2015    History of Present Illness 81 yo female admitted with s/p hemorrhoidopexy, N/V/D. Hx of dementia, HTN, fibromyalgia. Pt is from home with caregivers.    PT Comments    Progressing slowly with mobility. Pt agreeable to ambulation in room-limited by pain, frequent loose stools (pt in bathroom to toilet x 2 during session). For this reason, did not take pt out of room. After 2nd trip into bathroom, caregiver politely asked PT to defer any further activity (and pt requesting to return to bed as well). Caregiver stated she would assist pt back to bed once finished in bathroom.   Follow Up Recommendations  Home health PT;Supervision/Assistance - 24 hour (if family/caregiver agreeable)     Equipment Recommendations  None recommended by PT    Recommendations for Other Services       Precautions / Restrictions Precautions Precautions: Fall Restrictions Weight Bearing Restrictions: No    Mobility  Bed Mobility Overal bed mobility: Needs Assistance Bed Mobility: Supine to Sit     Supine to sit: Min assist     General bed mobility comments: Assist for trunk to upright  Transfers Overall transfer level: Needs assistance Equipment used: 1 person hand held assist Transfers: Sit to/from Stand Sit to Stand: Min assist         General transfer comment: assist to steady  Ambulation/Gait Ambulation/Gait assistance: Min assist Ambulation Distance (Feet): 35 Feet (35'x1, 12'x1) Assistive device: 1 person hand held assist (vs IV pole) Gait Pattern/deviations: Decreased stride length;Step-through pattern     General Gait Details: unsteady, weak. walked to bathroom then walked around room until pt had to return to bathroom. Sitter/caregiver asked PT to defer any more activity and stated she would assist pt back to bed after finished in bathroom.   Stairs             Wheelchair Mobility    Modified Rankin (Stroke Patients Only)       Balance           Standing balance support: During functional activity Standing balance-Leahy Scale: Fair                      Cognition Arousal/Alertness: Awake/alert Behavior During Therapy: WFL for tasks assessed/performed Overall Cognitive Status: History of cognitive impairments - at baseline                      Exercises      General Comments        Pertinent Vitals/Pain Pain Assessment: Faces Faces Pain Scale: Hurts whole lot Pain Location: bottome Pain Descriptors / Indicators: Sore Pain Intervention(s): Limited activity within patient's tolerance    Home Living                      Prior Function            PT Goals (current goals can now be found in the care plan section) Progress towards PT goals: Progressing toward goals (slowly. limited by pain, diarrhea)    Frequency  Min 3X/week    PT Plan Current plan remains appropriate    Co-evaluation             End of Session   Activity Tolerance: Patient limited by fatigue;Patient limited by pain Patient left:  (in bathroom with caregiver who stated she would assist pt back to bed )  Time: 1537-9432 PT Time Calculation (min) (ACUTE ONLY): 14 min  Charges:  $Gait Training: 8-22 mins                    G Codes:      [name], MPT Pager: [phone]

## 2015-01-09 NOTE — Progress Notes (Signed)
Leda Roys requested to speak with Spiritual Care due to concerns about the scope of her Advance Directive   Concerned that daughter would be able to use [facility] to place Mrs. Sievers in nursing home against her wishes.  Mrs Castilla wishes to stay in her house as long as possible.   Chaplain spoke with pt and caregiver about scope of advance directive.  Explained that Advance Directive only comes into effect if Mrs Fiebelkorn is unable to speak or make decisions for herself.  HCPOA does not allow family member to override Mrs. Azzarello's voice if she is competent to make decisions.  Spoke with Mrs Sipos and caregiver about Mrs Lamarche history of dementia, reminding that she could be declared not competent to make decisions due to progression of dementia.   Chaplain educated Mrs Germano and caregiver around default power of attorney in [place].  Mrs Thornberry understood that if she were to void HCPOA, her daughter would be default power of attorney due to her status as closest relative.  Mrs. Novosad would need to file new paperwork to change HCPOA.    Mrs Art was satisfied with scope of HCPOA and decided to leave advance directive in place.     Jerene Pitch [place]

## 2015-01-09 NOTE — Progress Notes (Signed)
Patient ID: Kimberly Acevedo, female   DOB: Apr 30, 1929, 81 y.o.   MRN: [id number]    Subjective: Pt denies nausea or pain.  Still having some loose stools.  Denies rectal bleeding  Objective: Vital signs in last 24 hours: Temp:  [98.2 F (36.8 C)-98.5 F (36.9 C)] 98.3 F (36.8 C) (08/04 1258) Pulse Rate:  [70-82] 71 (08/04 1258) Resp:  [16-18] 16 (08/04 1258) BP: (96-134)/(48-60) 134/48 mmHg (08/04 1258) SpO2:  [96 %-98 %] 98 % (08/04 1258) Last BM Date: 01/09/15  Intake/Output from previous day: 08/03 0701 - 08/04 0700 In: 1554 [P.O.:180; I.V.:1374] Out: 500 [Urine:500] Intake/Output this shift:    PE: Abd: less tender in LLQ, otherwise unremarkable, +BS Heart: regular  Lab Results:   Recent Labs  01/07/15 0406 01/09/15 0405  WBC 7.7 5.7  HGB 10.3* 9.6*  HCT 30.9* 30.0*  PLT 370 341   BMET  Recent Labs  01/08/15 0403 01/09/15 0405  NA 138 142  K 3.9 4.1  CL 109 109  CO2 25 26  GLUCOSE 108* 105*  BUN 6 7  CREATININE 0.77 0.77  CALCIUM 8.2* 8.5*   PT/INR No results for input(s): LABPROT, INR in the last 72 hours. CMP     Component Value Date/Time   NA 142 01/09/2015 0405   NA 143 11/22/2012 1316   K 4.1 01/09/2015 0405   K 3.7 11/22/2012 1316   CL 109 01/09/2015 0405   CL 103 11/22/2012 1316   CO2 26 01/09/2015 0405   CO2 29 11/22/2012 1316   GLUCOSE 105* 01/09/2015 0405   GLUCOSE 133* 11/22/2012 1316   BUN 7 01/09/2015 0405   BUN 8.7 11/22/2012 1316   CREATININE 0.77 01/09/2015 0405   CREATININE 0.8 11/22/2012 1316   CALCIUM 8.5* 01/09/2015 0405   CALCIUM 10.3 11/22/2012 1316   PROT 6.1* 01/07/2015 0406   PROT 7.3 11/22/2012 1316   ALBUMIN 2.9* 01/07/2015 0406   ALBUMIN 3.5 11/22/2012 1316   AST 27 01/07/2015 0406   AST 17 11/22/2012 1316   ALT 16 01/07/2015 0406   ALT 15 11/22/2012 1316   ALKPHOS 47 01/07/2015 0406   ALKPHOS 100 11/22/2012 1316   BILITOT 0.8 01/07/2015 0406   BILITOT 0.39 11/22/2012 1316   GFRNONAA >60 01/09/2015  0405   GFRAA >60 01/09/2015 0405   Lipase     Component Value Date/Time   LIPASE 19* 01/06/2015 1508       Studies/Results: No results found.  Anti-infectives: Anti-infectives    Start     Dose/Rate Route Frequency Ordered Stop   01/06/15 1630  metroNIDAZOLE (FLAGYL) IVPB 500 mg     500 mg 100 mL/hr over 60 Minutes Intravenous  Once 01/06/15 1615 01/06/15 1747       Assessment/Plan   Status post hemorrhoidopexy x 3 on 12/25/2014, now with pelvic hematoma -no acute surgical intervention needed currently for hematoma.  This will resolve on it's own.  hgb is down, but likely dilutional and not active bleeding. -nausea and diarrhea likely unrelated to surgery, defer to primary team for management -will see as needed, please call me if anything changes.   -would expect some incontinence/urgency after surgery  LOS: 3 days    Leviathan Macera C. 12/10/6431, 2:95 PM

## 2015-01-09 NOTE — Plan of Care (Signed)
Problem: Phase I Progression Outcomes Goal: OOB as tolerated unless otherwise ordered Outcome: Progressing oob to [facility] and to bathroom

## 2015-01-10 DIAGNOSIS — I10 Essential (primary) hypertension: Secondary | ICD-10-CM

## 2015-01-10 LAB — BASIC METABOLIC PANEL
ANION GAP: 8 (ref 5–15)
BUN: 7 mg/dL (ref 6–20)
CO2: 25 mmol/L (ref 22–32)
Calcium: 8.5 mg/dL — ABNORMAL LOW (ref 8.9–10.3)
Chloride: 108 mmol/L (ref 101–111)
Creatinine, Ser: 0.7 mg/dL (ref 0.44–1.00)
GFR calc Af Amer: >60 mL/min (ref >60)
GFR calc non Af Amer: >60 mL/min (ref >60)
Glucose, Bld: 117 mg/dL — ABNORMAL HIGH (ref 65–99)
Potassium: 3.9 mmol/L (ref 3.5–5.1)
Sodium: 141 mmol/L (ref 135–145)

## 2015-01-10 LAB — GI PATHOGEN PANEL BY PCR, STOOL
C difficile toxin A/B: NOT DETECTED
Campylobacter by PCR: NOT DETECTED
Cryptosporidium by PCR: NOT DETECTED
E coli (ETEC) LT/ST: NOT DETECTED
E coli (STEC): NOT DETECTED
E coli 0157 by PCR: NOT DETECTED
G lamblia by PCR: NOT DETECTED
Norovirus GI/GII: NOT DETECTED
Rotavirus A by PCR: NOT DETECTED
Salmonella by PCR: NOT DETECTED
Shigella by PCR: NOT DETECTED

## 2015-01-10 LAB — STOOL CULTURE

## 2015-01-10 LAB — MAGNESIUM: MAGNESIUM: 1.8 mg/dL (ref 1.7–2.4)

## 2015-01-10 MED ORDER — HYDROCHLOROTHIAZIDE 12.5 MG PO TABS: 12.5000 mg | ORAL_TABLET | Freq: Every day | ORAL | Status: DC | PRN

## 2015-01-10 MED ORDER — SENNOSIDES-DOCUSATE SODIUM 8.6-50 MG PO TABS: 1.0000 | ORAL_TABLET | Freq: Every day | ORAL | Status: DC

## 2015-01-10 MED ORDER — LOSARTAN POTASSIUM 50 MG PO TABS
100.0000 mg | ORAL_TABLET | Freq: Every day | ORAL | Status: DC
  Administered 2015-01-11: 100 mg via ORAL
  Filled 2015-01-10: qty 2

## 2015-01-10 MED ORDER — SENNOSIDES-DOCUSATE SODIUM 8.6-50 MG PO TABS
1.0000 | ORAL_TABLET | Freq: Two times a day (BID) | ORAL | Status: DC
  Administered 2015-01-10 – 2015-01-11 (×3): 1 via ORAL
  Filled 2015-01-10 (×5): qty 1

## 2015-01-10 MED ORDER — POLYETHYLENE GLYCOL 3350 17 G PO PACK
17.0000 g | PACK | Freq: Every day | ORAL | Status: DC
  Administered 2015-01-10 – 2015-01-11 (×2): 17 g via ORAL
  Filled 2015-01-10 (×3): qty 1

## 2015-01-10 MED ORDER — LOSARTAN POTASSIUM 100 MG PO TABS
100.0000 mg | ORAL_TABLET | Freq: Every day | ORAL | Status: DC
Start: 2015-01-11 — End: 2015-01-13

## 2015-01-10 NOTE — Progress Notes (Signed)
Physical Therapy Treatment Patient Details Name: Kimberly Acevedo MRN: [id number] DOB: 12-24-1928 Today's Date: 01/10/2015    History of Present Illness 81 yo female admitted with s/p hemorrhoidopexy, N/V/D. Hx of dementia, HTN, fibromyalgia. Pt is from home with caregivers.    PT Comments    Progressing with mobility.   Follow Up Recommendations  Supervision/Assistance - 24 hour;No PT follow up     Equipment Recommendations  None recommended by PT    Recommendations for Other Services       Precautions / Restrictions Precautions Precautions: Fall Restrictions Weight Bearing Restrictions: No    Mobility  Bed Mobility Overal bed mobility: Needs Assistance Bed Mobility: Supine to Sit;Sit to Supine     Supine to sit: Min assist Sit to supine: Min guard   General bed mobility comments: Assist for trunk to upright  Transfers Overall transfer level: Needs assistance Equipment used: 1 person hand held assist Transfers: Sit to/from Stand Sit to Stand: Min assist         General transfer comment: assist to rise, steady  Ambulation/Gait Ambulation/Gait assistance: Min assist Ambulation Distance (Feet): 150 Feet Assistive device: 1 person hand held assist Gait Pattern/deviations: Step-through pattern;Decreased stride length     General Gait Details: slow gait speed. unsteady but no overt LOB. 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Rankin (Stroke Patients Only)       Balance           Standing balance support: During functional activity Standing balance-Leahy Scale: Fair                      Cognition Arousal/Alertness: Awake/alert Behavior During Therapy: WFL for tasks assessed/performed Overall Cognitive Status: History of cognitive impairments - at baseline                      Exercises      General Comments        Pertinent Vitals/Pain Pain Assessment: Faces Faces Pain Scale:  Hurts little more Pain Location: bottom Pain Descriptors / Indicators: Sore Pain Intervention(s): Monitored during session    Home Living                      Prior Function            PT Goals (current goals can now be found in the care plan section) Progress towards PT goals: Progressing toward goals    Frequency  Min 3X/week    PT Plan Current plan remains appropriate    Co-evaluation             End of Session   Activity Tolerance: Patient limited by fatigue Patient left: in bed;with call bell/phone within reach;with family/visitor present     Time: 1035-1056 PT Time Calculation (min) (ACUTE ONLY): 21 min  Charges:  $Gait Training: 8-22 mins                    G Codes:      [name], MPT Pager: [phone]

## 2015-01-10 NOTE — Progress Notes (Addendum)
PROGRESS NOTE  Kimberly Acevedo VOZ:[id number] DOB: 1928-08-13 DOA: 01/06/2015 PCP: [name], MD  HPI/Recap of past 24 hours:   denies nausea, no vomiting, tolerating diet, per caregiver, diarrhea has slowed down,  patient  " sore" and distention in her abdomen , report it is better compare to before Personal care giver in room  Assessment/Plan: Principal Problem:   Nausea vomiting and diarrhea Active Problems:   Hypertension   Hypokalemia   Dementia   Intra-abdominal hematoma   Normocytic normochromic anemia  1. Nausea vomiting and diarrhea - stool for C. difficile has been negative. GI pathogens negative. N/V has resolved, less diarrhea, better oral intake, d/c ivf.  2. Abdominal hematoma with recent surgery - appreciate general surgery input, no indication for surgery, reported lower abdominal being "sore" since hemorrhoids surgery, no distention. Follow CBC. 3. Hypertension - hold HCTZ.  losartan initially decreased due to low normal bp. bp better, restart home dose losartan, continue hold HCTZ. 4. Hypokalemia and hypomagnesemia - probably from diarrhea  And hctz, Replace and recheck. better 5. Dementia - no acute issues. Pleasantly demented, oriented to person only. 6. Diarrhea has seems to resolved, now complaining straining, not able to move bowel, start stool softener  Code Status: DNR  Family Communication: patient and personal sitter in room  Disposition Plan:  d/c in am , pmd need to f/u with final gi panel result, has called [facility], they states the result won't be back until 8/10.   Consultants:  General surgery  Procedures:  none  Antibiotics:  none   Objective: BP 157/61 mmHg  Pulse 73  Temp(Src) 97.6 F (36.4 C) (Oral)  Resp 18  Ht 5\' 11"  (1.803 m)  Wt 73.1 kg (161 lb 2.5 oz)  BMI 22.49 kg/m2  SpO2 97%  Intake/Output Summary (Last 24 hours) at 01/10/15 1150 Last data filed at 01/10/15 0715  Gross per 24 hour  Intake 2155.75 ml   Output   1075 ml  Net 1080.75 ml   Filed Weights   01/06/15 2321  Weight: 73.1 kg (161 lb 2.5 oz)    Exam:   General:  NAD  Cardiovascular: RRR  Respiratory: CTABL  Abdomen: Soft/ND/NT, positive BS  Musculoskeletal: No Edema  Neuro: baseline dementia  Data Reviewed: Basic Metabolic Panel:  Recent Labs Lab 01/06/15 1508 01/06/15 2023 01/07/15 0406 01/08/15 0403 01/09/15 0405 01/10/15 0415  NA 133*  --  138 138 142 141  K 3.0*  --  3.7 3.9 4.1 3.9  CL 98*  --  107 109 109 108  CO2 24  --  25 25 26 25   GLUCOSE 120*  --  107* 108* 105* 117*  BUN 13  --  10 6 7 7   CREATININE 0.98  --  0.78 0.77 0.77 0.70  CALCIUM 9.2  --  8.3* 8.2* 8.5* 8.5*  MG  --  1.4* 2.0 1.8 1.7 1.8   Liver Function Tests:  Recent Labs Lab 01/06/15 1508 01/07/15 0406  AST 32 27  ALT 20 16  ALKPHOS 58 47  BILITOT 1.1 0.8  PROT 7.4 6.1*  ALBUMIN 3.6 2.9*    Recent Labs Lab 01/06/15 1508  LIPASE 19*   No results for input(s): AMMONIA in the last 168 hours. CBC:  Recent Labs Lab 01/06/15 1508 01/07/15 0406 01/09/15 0405  WBC 9.1 7.7 5.7  NEUTROABS  --  4.8  --   HGB 11.8* 10.3* 9.6*  HCT 35.6* 30.9* 30.0*  MCV 94.4 95.1 98.4  PLT  458* 370 341   Cardiac Enzymes:   No results for input(s): CKTOTAL, CKMB, CKMBINDEX, TROPONINI in the last 168 hours. BNP (last 3 results) No results for input(s): BNP in the last 8760 hours.  ProBNP (last 3 results) No results for input(s): PROBNP in the last 8760 hours.  CBG: No results for input(s): GLUCAP in the last 168 hours.  Recent Results (from the past 240 hour(s))  Stool culture     Status: None   Collection Time: 01/06/15  5:51 PM  Result Value Ref Range Status   Specimen Description STOOL  Final   Special Requests NONE  Final   Culture   Final    NO SALMONELLA, SHIGELLA, CAMPYLOBACTER, YERSINIA, OR E.COLI 0157:H7 ISOLATED Performed at [facility]    Report Status 01/10/2015 FINAL  Final  Clostridium  Difficile by PCR (not at [facility])     Status: None   Collection Time: 01/06/15  6:20 PM  Result Value Ref Range Status   Toxigenic C Difficile by pcr NEGATIVE NEGATIVE Final     Studies: No results found.  Scheduled Meds: . cholecalciferol  2,000 Units Oral Daily  . losartan  50 mg Oral Daily  . sertraline  100 mg Oral q morning - 10a  . vitamin B-12  1,000 mcg Oral Daily    Continuous Infusions:     Time spent: 75mins  [name] MD, PhD  [facility] Pager [phone]. If 7PM-7AM, please contact night-coverage at [email or web address], password [facility] 01/10/2015, 11:50 AM  LOS: 4 days

## 2015-01-10 NOTE — Discharge Summary (Signed)
Discharge Summary  Kimberly Acevedo HER:[id number] DOB: May 30, 1929  PCP: [name], MD  Admit date: 01/06/2015 Discharge date: 01/13/2015  Time spent: <75mins  Recommendations for Outpatient Follow-up:  1. F/u with PMD within one week, repeat cbc/bmp at follow up 2. D/c home with home health  Discharge Diagnoses:  Active Hospital Problems   Diagnosis Date Noted  . Nausea vomiting and diarrhea 01/06/2015  . Intra-abdominal hematoma 01/06/2015  . Normocytic normochromic anemia 01/06/2015  . Dementia 06/15/2014  . Hypokalemia 05/21/2013  . Hypertension     Resolved Hospital Problems   Diagnosis Date Noted Date Resolved  No resolved problems to display.    Discharge Condition: stable  Diet recommendation: heart healthy  Filed Weights   01/06/15 2321  Weight: 73.1 kg (161 lb 2.5 oz)    History of present illness:  Kimberly Acevedo is a 81 y.o. female with history of dementia and hypertension who has had a recent hemorrhoidectomy was brought to the ER after patient had persistent diarrhea with nausea and vomiting and abdominal pain. Patient's abdominal pain is mostly in the lower quadrant. In the ER patient had CT abdomen and pelvis which shows large hematoma and on-call surgeon Dr. Harlow Asa was consulted and at this time patient has been admitted for further management for the hematoma and diarrhea. On exam patient is not in distress. In addition patient's labs also shows hypokalemia and hypomagnesemia.   Hospital Course:  Principal Problem:   Nausea vomiting and diarrhea Active Problems:   Hypertension   Hypokalemia   Dementia   Intra-abdominal hematoma   Normocytic normochromic anemia  1. UTI?: did report dysuria on 8/6, urine culture pending, treated with IV levqauin, no fever, no leukocytosis, dysuria has resolved, discharge with oral levaquin for two more days, follow up with pmd in a week. 2. Nausea vomiting and diarrhea - stool for C. difficile  negative. GI  pathogens negative. Resolved 3. Abdominal hematoma with recent surgery - appreciate general surgery input, no indication for surgery, reported lower abdominal being "sore" since hemorrhoids surgery, no distention. CBC stable. 4. Hypertension - hold HCTZ. losartan dose adjusted per bp changes, d/c HCTZ. 5. Hypokalemia and hypomagnesemia - probably from diarrhea and hctz, Replace, resolved, d/c hctz. 6. Dementia - no acute issues. Pleasantly demented, oriented to person only.  Code Status: DNR  Family Communication: patient and personal sitter in room  Procedures:  none  Consultations:  [facility]   Discharge Exam: BP 118/48 mmHg  Pulse 66  Temp(Src) 98.6 F (37 C) (Oral)  Resp 16  Ht 5\' 11"  (1.803 m)  Wt 73.1 kg (161 lb 2.5 oz)  BMI 22.49 kg/m2  SpO2 95%  General: NAD Cardiovascular: RRR Respiratory: CTABL Abdomen: Soft/ND/NT, positive BS Musculoskeletal: No Edema Neuro: baseline dementia    Discharge Instructions You were cared for by a hospitalist during your hospital stay. If you have any questions about your discharge medications or the care you received while you were in the hospital after you are discharged, you can call the unit and asked to speak with the hospitalist on call if the hospitalist that took care of you is not available. Once you are discharged, your primary care physician will handle any further medical issues. Please note that NO REFILLS for any discharge medications will be authorized once you are discharged, as it is imperative that you return to your primary care physician (or establish a relationship with a primary care physician if you do not have one) for your  aftercare needs so that they can reassess your need for medications and monitor your lab values.      Discharge Instructions    Diet - low sodium heart healthy    Complete by:  As directed      Diet - low sodium heart healthy    Complete by:  As directed       Face-to-face encounter (required for Medicare/Medicaid patients)    Complete by:  As directed   I Adelai Achey certify that this patient is under my care and that I, or a nurse practitioner or physician's assistant working with me, had a face-to-face encounter that meets the physician face-to-face encounter requirements with this patient on 01/09/2015. The encounter with the patient was in whole, or in part for the following medical condition(s) which is the primary reason for home health care (List medical condition): FTT/dementia  The encounter with the patient was in whole, or in part, for the following medical condition, which is the primary reason for home health care:  FTT/ dementia  I certify that, based on my findings, the following services are medically necessary home health services:  Physical therapy  Reason for Medically Necessary Home Health Services:  Skilled Nursing- Change/Decline in Patient Status  My clinical findings support the need for the above services:  Cognitive impairments, dementia, or mental confusion  that make it unsafe to leave home  Further, I certify that my clinical findings support that this patient is homebound due to:  Mental confusion     Home Health    Complete by:  As directed   To provide the following care/treatments:  PT     Increase activity slowly    Complete by:  As directed      Increase activity slowly    Complete by:  As directed             Medication List    STOP taking these medications        doxycycline 100 MG tablet  Commonly known as:  VIBRA-TABS     losartan-hydrochlorothiazide 100-25 MG per tablet  Commonly known as:  HYZAAR     metroNIDAZOLE 500 MG tablet  Commonly known as:  FLAGYL      TAKE these medications        acetaminophen 325 MG tablet  Commonly known as:  TYLENOL  Take 650 mg by mouth every 6 (six) hours as needed.     ALIGN 4 MG Caps  Take 1 capsule by mouth daily.     diltiazem 2 % Gel  Apply 1 application  topically 3 (three) times daily.     levofloxacin 750 MG tablet  Commonly known as:  LEVAQUIN  Take 1 tablet (750 mg total) by mouth daily.     lidocaine 2 % jelly  Commonly known as:  XYLOCAINE  Apply 1 application topically as needed. Apply 4 times daily to the affected area.     loperamide 2 MG tablet  Commonly known as:  IMODIUM A-D  Take 2 mg by mouth 4 (four) times daily as needed for diarrhea or loose stools.     losartan 50 MG tablet  Commonly known as:  COZAAR  Take 1 tablet (50 mg total) by mouth daily.     oxyCODONE 5 MG immediate release tablet  Commonly known as:  Oxy IR/ROXICODONE  Take 1 tablet (5 mg total) by mouth every 6 (six) hours as needed for severe pain.     polyethylene glycol packet  Commonly known as:  MIRALAX / GLYCOLAX  Take 17 g by mouth daily as needed for mild constipation or moderate constipation. Use as directed until your bowel movements are soft and coming daily, then you may decrease to every other day to continue having daily soft BMs     sertraline 100 MG tablet  Commonly known as:  ZOLOFT  Take 100 mg by mouth every morning.     vitamin B-12 1000 MCG tablet  Commonly known as:  CYANOCOBALAMIN  Take 1,000 mcg by mouth daily.     Vitamin D 2000 UNITS Caps  Take 1 capsule by mouth daily.       Allergies  Allergen Reactions  . Penicillins Swelling and Rash  . Statins   . Sulfa Drugs Cross Reactors Shortness Of Breath  . Tramadol Hcl Anaphylaxis  . Aspirin Swelling  . Codeine Other (See Comments)    unknown  . Morphine And Related Other (See Comments)    Hypoglycemia   Follow-up Information    Follow up with [name], MD In 1 week.   Specialty:  Internal Medicine   Why:  hospital discharge follow up, repeat cbc/bmp at follow up   Contact information:   [place] [address] [phone]       Follow up with [facility].   Why:  Home Health Physical Therapy   Contact  information:   [place] [address] [phone]        The results of significant diagnostics from this hospitalization (including imaging, microbiology, ancillary and laboratory) are listed below for reference.    Significant Diagnostic Studies: Ct Abdomen Pelvis W Contrast  01/06/2015   CLINICAL DATA:  Nausea, vomiting and diarrhea. Status post hemorrhoid surgery on 12/25/2014.  EXAM: CT ABDOMEN AND PELVIS WITH CONTRAST  TECHNIQUE: Multidetector CT imaging of the abdomen and pelvis was performed using the standard protocol following bolus administration of intravenous contrast.  CONTRAST:  18mL OMNIPAQUE IOHEXOL 300 MG/ML  SOLN  COMPARISON:  12/05/2014.  FINDINGS: Small sliding hiatal hernia. Stable left renal cyst. Multiple colonic diverticula without evidence of diverticulitis. Normal appearing appendix.  There is an oval, mildly heterogeneous, medium to high density mass in the inferior, posterior left pelvis. This measures 9.1 x 6.7 cm on coronal image number 76 and 7.4 cm in width on axial image number 62. This has asymmetrical superior extension on the left, medial to the left ovary. No soft tissue gas or peripheral rim enhancement.  The uterus is surgically absent. Normal appearing ovaries, urinary bladder, ureters, right kidney, adrenal glands, pancreas and spleen. Mild diffuse low density of the liver relative to the spleen. No small bowel abnormalities.  The lungs are mildly hyperexpanded and clear at the lung bases. There is minimal linear density at both lung bases, compatible with scarring. Lumbar and lower thoracic spine degenerative changes. Atheromatous arterial calcifications.  IMPRESSION: 1. 9.1 x 7.4 x 6.7 cm left inferior, posterior pelvic mass not displacing the rectum to the right. This has appearance most compatible with a postoperative hematoma. 2. Colonic diverticulosis. 3. Small sliding hiatal hernia. 4. Mild diffuse hepatic steatosis. 5. Mild  changes of COPD.   Electronically Signed   By: Claudie Revering M.D.   On: 01/06/2015 19:21   Dg Abd Portable 1v  12/25/2014   CLINICAL DATA:  Status post hemorrhoidectomy  EXAM: PORTABLE ABDOMEN - 1 VIEW  COMPARISON:  12/05/2014  FINDINGS: Scattered large and small bowel gas  is noted. Decubitus views were obtained and demonstrate no evidence of free air. No acute abnormality is noted.  IMPRESSION: No evidence of free air.   Electronically Signed   By: Inez Catalina M.D.   On: 12/25/2014 14:14    Microbiology: Recent Results (from the past 240 hour(s))  Stool culture     Status: None   Collection Time: 01/06/15  5:51 PM  Result Value Ref Range Status   Specimen Description STOOL  Final   Special Requests NONE  Final   Culture   Final    NO SALMONELLA, SHIGELLA, CAMPYLOBACTER, YERSINIA, OR E.COLI 0157:H7 ISOLATED Performed at [facility]    Report Status 01/10/2015 FINAL  Final  Clostridium Difficile by PCR (not at [facility])     Status: None   Collection Time: 01/06/15  6:20 PM  Result Value Ref Range Status   Toxigenic C Difficile by pcr NEGATIVE NEGATIVE Final     Labs: Basic Metabolic Panel:  Recent Labs Lab 01/08/15 0403 01/09/15 0405 01/10/15 0415 01/11/15 0417 01/12/15 0441 01/13/15 0426  NA 138 142 141 141 139 138  K 3.9 4.1 3.9 3.9 4.3 4.1  CL 109 109 108 106 106 105  CO2 25 26 25 26 29 26   GLUCOSE 108* 105* 117* 106* 101* 129*  BUN 6 7 7 8 7 13   CREATININE 0.77 0.77 0.70 0.78 0.81 0.80  CALCIUM 8.2* 8.5* 8.5* 8.7* 8.6* 8.5*  MG 1.8 1.7 1.8 1.8 2.4  --    Liver Function Tests:  Recent Labs Lab 01/06/15 1508 01/07/15 0406 01/11/15 0417 01/13/15 0426  AST 32 27 25 22   ALT 20 16 17  13*  ALKPHOS 58 47 45 58  BILITOT 1.1 0.8 0.6 0.5  PROT 7.4 6.1* 5.9* 5.8*  ALBUMIN 3.6 2.9* 3.1* 3.0*    Recent Labs Lab 01/06/15 1508  LIPASE 19*   No results for input(s): AMMONIA in the last 168 hours. CBC:  Recent Labs Lab 01/06/15 1508 01/07/15 0406  01/09/15 0405 01/11/15 1037 01/13/15 0426  WBC 9.1 7.7 5.7 9.2 5.7  NEUTROABS  --  4.8  --   --   --   HGB 11.8* 10.3* 9.6* 11.2* 10.3*  HCT 35.6* 30.9* 30.0* 34.9* 32.2*  MCV 94.4 95.1 98.4 96.7 98.8  PLT 458* 370 341 333 282   Cardiac Enzymes: No results for input(s): CKTOTAL, CKMB, CKMBINDEX, TROPONINI in the last 168 hours. BNP: BNP (last 3 results) No results for input(s): BNP in the last 8760 hours.  ProBNP (last 3 results) No results for input(s): PROBNP in the last 8760 hours.  CBG: No results for input(s): GLUCAP in the last 168 hours.     SignedFlorencia Reasons MD, PhD  [facility] 01/13/2015, 10:22 AM

## 2015-01-11 DIAGNOSIS — N390 Urinary tract infection, site not specified: Principal | ICD-10-CM

## 2015-01-11 LAB — COMPREHENSIVE METABOLIC PANEL
ALK PHOS: 45 U/L (ref 38–126)
ALT: 17 U/L (ref 14–54)
AST: 25 U/L (ref 15–41)
Albumin: 3.1 g/dL — ABNORMAL LOW (ref 3.5–5.0)
Anion gap: 9 (ref 5–15)
BUN: 8 mg/dL (ref 6–20)
CO2: 26 mmol/L (ref 22–32)
CREATININE: 0.78 mg/dL (ref 0.44–1.00)
Calcium: 8.7 mg/dL — ABNORMAL LOW (ref 8.9–10.3)
Chloride: 106 mmol/L (ref 101–111)
Glucose, Bld: 106 mg/dL — ABNORMAL HIGH (ref 65–99)
Potassium: 3.9 mmol/L (ref 3.5–5.1)
Sodium: 141 mmol/L (ref 135–145)
Total Bilirubin: 0.6 mg/dL (ref 0.3–1.2)
Total Protein: 5.9 g/dL — ABNORMAL LOW (ref 6.5–8.1)

## 2015-01-11 LAB — URINALYSIS, ROUTINE W REFLEX MICROSCOPIC
BILIRUBIN URINE: NEGATIVE
Glucose, UA: NEGATIVE mg/dL
Ketones, ur: NEGATIVE mg/dL
NITRITE: NEGATIVE
PROTEIN: 100 mg/dL — AB
Specific Gravity, Urine: 1.016 (ref 1.005–1.030)
Urobilinogen, UA: 1 mg/dL (ref 0.0–1.0)
pH: 8 (ref 5.0–8.0)

## 2015-01-11 LAB — URINE MICROSCOPIC-ADD ON

## 2015-01-11 LAB — CBC
HEMATOCRIT: 34.9 % — AB (ref 36.0–46.0)
Hemoglobin: 11.2 g/dL — ABNORMAL LOW (ref 12.0–15.0)
MCH: 31 pg (ref 26.0–34.0)
MCHC: 32.1 g/dL (ref 30.0–36.0)
MCV: 96.7 fL (ref 78.0–100.0)
Platelets: 333 10*3/uL (ref 150–400)
RBC: 3.61 MIL/uL — AB (ref 3.87–5.11)
RDW: 14 % (ref 11.5–15.5)
WBC: 9.2 10*3/uL (ref 4.0–10.5)

## 2015-01-11 LAB — MAGNESIUM: MAGNESIUM: 1.8 mg/dL (ref 1.7–2.4)

## 2015-01-11 MED ORDER — LEVOFLOXACIN IN D5W 500 MG/100ML IV SOLN
500.0000 mg | INTRAVENOUS | Status: DC
  Administered 2015-01-11 – 2015-01-12 (×2): 500 mg via INTRAVENOUS
  Filled 2015-01-11 (×3): qty 100

## 2015-01-11 MED ORDER — MAGNESIUM HYDROXIDE 400 MG/5ML PO SUSP
30.0000 mL | Freq: Two times a day (BID) | ORAL | Status: AC
  Administered 2015-01-11 (×2): 30 mL via ORAL
  Filled 2015-01-11 (×2): qty 30

## 2015-01-11 MED ORDER — SODIUM CHLORIDE 0.9 % IV BOLUS (SEPSIS): 1000.0000 mL | INTRAVENOUS | Status: DC | PRN

## 2015-01-11 MED ORDER — LOSARTAN POTASSIUM 50 MG PO TABS
50.0000 mg | ORAL_TABLET | Freq: Every day | ORAL | Status: DC
  Administered 2015-01-12 – 2015-01-13 (×2): 50 mg via ORAL
  Filled 2015-01-11 (×2): qty 1

## 2015-01-11 MED ORDER — SODIUM CHLORIDE 0.9 % IV SOLN
INTRAVENOUS | Status: AC
  Administered 2015-01-11 – 2015-01-12 (×2): via INTRAVENOUS

## 2015-01-11 MED ORDER — PHENAZOPYRIDINE HCL 100 MG PO TABS
100.0000 mg | ORAL_TABLET | Freq: Three times a day (TID) | ORAL | Status: DC | PRN
  Administered 2015-01-11: 100 mg via ORAL
  Filled 2015-01-11 (×5): qty 1

## 2015-01-11 NOTE — Progress Notes (Signed)
ANTIBIOTIC CONSULT NOTE - INITIAL  Pharmacy Consult for levofloxacin Indication: UTI  Allergies  Allergen Reactions  . Penicillins Swelling and Rash  . Statins   . Sulfa Drugs Cross Reactors Shortness Of Breath  . Tramadol Hcl Anaphylaxis  . Aspirin Swelling  . Codeine Other (See Comments)    unknown  . Morphine And Related Other (See Comments)    Hypoglycemia    Patient Measurements: Height: 5\' 11"  (180.3 cm) Weight: 161 lb 2.5 oz (73.1 kg) IBW/kg (Calculated) : 70.8 Adjusted Body Weight:   Vital Signs: Temp: 98.3 F (36.8 C) (08/06 0520) Temp Source: Oral (08/06 0520) BP: 97/71 mmHg (08/06 1102) Pulse Rate: 88 (08/06 1102) Intake/Output from previous day: 08/05 0701 - 08/06 0700 In: 2671 [P.O.:1230] Out: -  Intake/Output from this shift:    Labs:  Recent Labs  01/09/15 0405 01/10/15 0415 01/11/15 0417 01/11/15 1037  WBC 5.7  --   --  9.2  HGB 9.6*  --   --  11.2*  PLT 341  --   --  333  CREATININE 0.77 0.70 0.78  --    Estimated Creatinine Clearance: 58.5 mL/min (by C-G formula based on Cr of 0.78). No results for input(s): VANCOTROUGH, VANCOPEAK, VANCORANDOM, GENTTROUGH, GENTPEAK, GENTRANDOM, TOBRATROUGH, TOBRAPEAK, TOBRARND, AMIKACINPEAK, AMIKACINTROU, AMIKACIN in the last 72 hours.   Microbiology: Recent Results (from the past 720 hour(s))  Stool culture     Status: None   Collection Time: 01/06/15  5:51 PM  Result Value Ref Range Status   Specimen Description STOOL  Final   Special Requests NONE  Final   Culture   Final    NO SALMONELLA, SHIGELLA, CAMPYLOBACTER, YERSINIA, OR E.COLI 0157:H7 ISOLATED Performed at [facility]    Report Status 01/10/2015 FINAL  Final  Clostridium Difficile by PCR (not at [facility])     Status: None   Collection Time: 01/06/15  6:20 PM  Result Value Ref Range Status   Toxigenic C Difficile by pcr NEGATIVE NEGATIVE Final    Medical History: Past Medical History  Diagnosis Date  . Fibromyalgia   .  Dyslipidemia   . Hypertension   . Hemorrhoids   . Arthritis   . OSA on CPAP   . History of diverticulitis of colon   . GERD (gastroesophageal reflux disease)   . History of breast cancer oncologist-  dr Humphrey Rolls--- no recurrence    dx 05-19-2012  Stage I , Invasive Lobular/ LCIS & microinvasive ductal and DCIS (T1 A NX M0)--  s/p  left partial mastectomy 07-04-2012 and radiation  . History of radiation therapy 08-10-2012 to 09-08-2012    Left breast 750 cGy in 3 sessions  . Prolapsed internal hemorrhoids, grade 3   . Varicose veins   . Cognitive dysfunction     DUE TO DEMENTIA--  PER NEUROLOGIST NOTE SEVERE  . Dementia with behavioral disturbance     PER NEUROLOGIST NOTE PT ABLE TO DO ADL'S BUT UNABLE TO CARE FOR HOME AND NEEDED AND PT HAS 24 HOUR CARE  . History of squamous cell carcinoma excision     NOSE  . Bilateral edema of lower extremity   . Anal fissure    Assessment: 61 YOF presents with dysuria.  Pharmacy asked to dose levofloxacin for UTI.   PCN allergy: swelling/rash  8/6 >>levofloxacin  >>  / urine (+ pyuria on UA):   Renal: Scr WNL WBC WNL afebrile  Dose changes/levels:   Goal of Therapy:  Dose for indication and for patient-specific  parameters  Plan:   Levofloxacin 500mg  IV q24h for UTI  Await cultures and narrow as able  Doreene Eland, PharmD, BCPS.   Pager: [phone] 01/11/2015,12:33 PM

## 2015-01-11 NOTE — Care Management Note (Signed)
Case Management Note  Patient Details  Name: ALOHA Acevedo MRN: [id number] Date of Birth: 1928-09-05  Subjective/Objective:          Anemia          Action/Plan: Home Health  Expected Discharge Date:  01/11/2015               Expected Discharge Plan:  [place]  In-House Referral:  Clinical Social Work  Discharge planning Services  CM Consult  HH Arranged:  PT [facility] Agency:  [place]  Status of Service:  Completed, signed off  Medicare Important Message Given:  Yes-second notification given Date Medicare IM Given:    Medicare IM give by:    Date Additional Medicare IM Given:    Additional Medicare Important Message give by:     If discussed at [place] of Stay Meetings, dates discussed:    Additional Comments: NCM spoke to pt and gave permission to speak to Caregiver, Kimberly Acevedo. Kimberly Acevedo reports she has 24 hour caregivers at home. She is independent with walking and taking medications. The caregivers cooks and cleans in the home and assist with dressing and bathing. Offered choice for [facility] PT. Agreeable to [facility] for [facility]. Pt states she does not usually use a RW at home but has RW at home. Kimberly Acevedo with new referral.  Kimberly Rasher, RN 01/11/2015, 11:03 AM

## 2015-01-11 NOTE — Progress Notes (Signed)
PROGRESS NOTE  Kimberly Acevedo WGN:[id number] DOB: 17-May-1929 DOA: 01/06/2015 PCP: [name], MD  HPI/Recap of past 24 hours:   reported not feeling well this am, reported burning when urinate,  denies nausea, no vomiting, diarrhea has resolved, no seems constipated.  Personal care giver in room  Assessment/Plan: Principal Problem:   Nausea vomiting and diarrhea Active Problems:   Hypertension   Hypokalemia   Dementia   Intra-abdominal hematoma   Normocytic normochromic anemia  1. UTI: urine culture pending, start IV levqauin, ivf 2. Nausea vomiting and diarrhea - stool for C. difficile has been negative. GI pathogens negative. Resolved 3. Abdominal hematoma with recent surgery - appreciate general surgery input, no indication for surgery, reported lower abdominal being "sore" since hemorrhoids surgery, no distention. Follow CBC. 4. Hypertension - hold HCTZ.  losartan dose adjusted per bp changes, continue hold HCTZ. 5. Hypokalemia and hypomagnesemia - probably from diarrhea  And hctz, Replace and recheck. better 6. Dementia - no acute issues. Pleasantly demented, oriented to person only. 7. Diarrhea has seems to resolved, now complaining straining, not able to move bowel, start stool softener  Code Status: DNR  Family Communication: patient and personal sitter in room  Disposition Plan:  Remain in patient   Consultants:  General surgery  Procedures:  none  Antibiotics:  levaquin from 8/6.   Objective: BP 97/71 mmHg  Pulse 88  Temp(Src) 98.3 F (36.8 C) (Oral)  Resp 18  Ht 5\' 11"  (1.803 m)  Wt 73.1 kg (161 lb 2.5 oz)  BMI 22.49 kg/m2  SpO2 94%  Intake/Output Summary (Last 24 hours) at 01/11/15 1126 Last data filed at 01/11/15 0520  Gross per 24 hour  Intake    930 ml  Output      0 ml  Net    930 ml   Filed Weights   01/06/15 2321  Weight: 73.1 kg (161 lb 2.5 oz)    Exam:   General:  frail  Cardiovascular: RRR  Respiratory:  CTABL  Abdomen: Soft/ND/NT, positive BS  Musculoskeletal: No Edema  Neuro: baseline dementia  Data Reviewed: Basic Metabolic Panel:  Recent Labs Lab 01/07/15 0406 01/08/15 0403 01/09/15 0405 01/10/15 0415 01/11/15 0417  NA 138 138 142 141 141  K 3.7 3.9 4.1 3.9 3.9  CL 107 109 109 108 106  CO2 25 25 26 25 26   GLUCOSE 107* 108* 105* 117* 106*  BUN 10 6 7 7 8   CREATININE 0.78 0.77 0.77 0.70 0.78  CALCIUM 8.3* 8.2* 8.5* 8.5* 8.7*  MG 2.0 1.8 1.7 1.8 1.8   Liver Function Tests:  Recent Labs Lab 01/06/15 1508 01/07/15 0406 01/11/15 0417  AST 32 27 25  ALT 20 16 17   ALKPHOS 58 47 45  BILITOT 1.1 0.8 0.6  PROT 7.4 6.1* 5.9*  ALBUMIN 3.6 2.9* 3.1*    Recent Labs Lab 01/06/15 1508  LIPASE 19*   No results for input(s): AMMONIA in the last 168 hours. CBC:  Recent Labs Lab 01/06/15 1508 01/07/15 0406 01/09/15 0405 01/11/15 1037  WBC 9.1 7.7 5.7 9.2  NEUTROABS  --  4.8  --   --   HGB 11.8* 10.3* 9.6* 11.2*  HCT 35.6* 30.9* 30.0* 34.9*  MCV 94.4 95.1 98.4 96.7  PLT 458* 370 341 333   Cardiac Enzymes:   No results for input(s): CKTOTAL, CKMB, CKMBINDEX, TROPONINI in the last 168 hours. BNP (last 3 results) No results for input(s): BNP in the last 8760 hours.  ProBNP (last  3 results) No results for input(s): PROBNP in the last 8760 hours.  CBG: No results for input(s): GLUCAP in the last 168 hours.  Recent Results (from the past 240 hour(s))  Stool culture     Status: None   Collection Time: 01/06/15  5:51 PM  Result Value Ref Range Status   Specimen Description STOOL  Final   Special Requests NONE  Final   Culture   Final    NO SALMONELLA, SHIGELLA, CAMPYLOBACTER, YERSINIA, OR E.COLI 0157:H7 ISOLATED Performed at [facility]    Report Status 01/10/2015 FINAL  Final  Clostridium Difficile by PCR (not at [facility])     Status: None   Collection Time: 01/06/15  6:20 PM  Result Value Ref Range Status   Toxigenic C Difficile by pcr NEGATIVE  NEGATIVE Final     Studies: No results found.  Scheduled Meds: . cholecalciferol  2,000 Units Oral Daily  . losartan  100 mg Oral Daily  . magnesium hydroxide  30 mL Oral BID  . polyethylene glycol  17 g Oral Daily  . senna-docusate  1 tablet Oral BID  . sertraline  100 mg Oral q morning - 10a  . vitamin B-12  1,000 mcg Oral Daily    Continuous Infusions: . sodium chloride       Time spent: 32mins  [name] MD, PhD  [facility] Pager [phone]. If 7PM-7AM, please contact night-coverage at [email or web address], password [facility] 01/11/2015, 11:26 AM  LOS: 5 days

## 2015-01-12 LAB — BASIC METABOLIC PANEL
ANION GAP: 4 — AB (ref 5–15)
BUN: 7 mg/dL (ref 6–20)
CO2: 29 mmol/L (ref 22–32)
CREATININE: 0.81 mg/dL (ref 0.44–1.00)
Calcium: 8.6 mg/dL — ABNORMAL LOW (ref 8.9–10.3)
Chloride: 106 mmol/L (ref 101–111)
GLUCOSE: 101 mg/dL — AB (ref 65–99)
Potassium: 4.3 mmol/L (ref 3.5–5.1)
SODIUM: 139 mmol/L (ref 135–145)

## 2015-01-12 LAB — MAGNESIUM: Magnesium: 2.4 mg/dL (ref 1.7–2.4)

## 2015-01-12 MED ORDER — SENNOSIDES-DOCUSATE SODIUM 8.6-50 MG PO TABS
1.0000 | ORAL_TABLET | Freq: Every day | ORAL | Status: DC
  Filled 2015-01-12: qty 1

## 2015-01-12 NOTE — Progress Notes (Signed)
PROGRESS NOTE  Kimberly Acevedo HRC:[id number] DOB: 07/01/1928 DOA: 01/06/2015 PCP: [name], MD  HPI/Recap of past 24 hours:   reported dysuria has resolved, has loose stool but start to form, not watery  Personal care giver in room  Assessment/Plan: Principal Problem:   Nausea vomiting and diarrhea Active Problems:   Hypertension   Hypokalemia   Dementia   Intra-abdominal hematoma   Normocytic normochromic anemia  1. UTI: urine culture pending, start IV levqauin, ivf 2. Nausea vomiting and diarrhea - stool for C. difficile has been negative. GI pathogens negative. Resolved 3. Abdominal hematoma with recent surgery - appreciate general surgery input, no indication for surgery, reported lower abdominal being "sore" since hemorrhoids surgery, no distention. Follow CBC. 4. Hypertension - hold HCTZ.  losartan dose adjusted per bp changes, continue hold HCTZ. 5. Hypokalemia and hypomagnesemia - probably from diarrhea  And hctz, Replace and recheck. better 6. Dementia - no acute issues. Pleasantly demented, oriented to person only. 7. Diarrhea has seems to resolved, now complaining straining, not able to move bowel, start stool softener  Code Status: DNR  Family Communication: patient and personal sitter in room  Disposition Plan:  Remain in patient   Consultants:  General surgery  Procedures:  none  Antibiotics:  levaquin from 8/6.   Objective: BP 107/38 mmHg  Pulse 67  Temp(Src) 98.3 F (36.8 C) (Oral)  Resp 16  Ht 5\' 11"  (1.803 m)  Wt 73.1 kg (161 lb 2.5 oz)  BMI 22.49 kg/m2  SpO2 92%  Intake/Output Summary (Last 24 hours) at 01/12/15 1022 Last data filed at 01/11/15 2106  Gross per 24 hour  Intake    240 ml  Output    700 ml  Net   -460 ml   Filed Weights   01/06/15 2321  Weight: 73.1 kg (161 lb 2.5 oz)    Exam:   General:  frail  Cardiovascular: RRR  Respiratory: CTABL  Abdomen: Soft/ND/NT, positive BS  Musculoskeletal: No  Edema  Neuro: baseline dementia  Data Reviewed: Basic Metabolic Panel:  Recent Labs Lab 01/08/15 0403 01/09/15 0405 01/10/15 0415 01/11/15 0417 01/12/15 0441  NA 138 142 141 141 139  K 3.9 4.1 3.9 3.9 4.3  CL 109 109 108 106 106  CO2 25 26 25 26 29   GLUCOSE 108* 105* 117* 106* 101*  BUN 6 7 7 8 7   CREATININE 0.77 0.77 0.70 0.78 0.81  CALCIUM 8.2* 8.5* 8.5* 8.7* 8.6*  MG 1.8 1.7 1.8 1.8 2.4   Liver Function Tests:  Recent Labs Lab 01/06/15 1508 01/07/15 0406 01/11/15 0417  AST 32 27 25  ALT 20 16 17   ALKPHOS 58 47 45  BILITOT 1.1 0.8 0.6  PROT 7.4 6.1* 5.9*  ALBUMIN 3.6 2.9* 3.1*    Recent Labs Lab 01/06/15 1508  LIPASE 19*   No results for input(s): AMMONIA in the last 168 hours. CBC:  Recent Labs Lab 01/06/15 1508 01/07/15 0406 01/09/15 0405 01/11/15 1037  WBC 9.1 7.7 5.7 9.2  NEUTROABS  --  4.8  --   --   HGB 11.8* 10.3* 9.6* 11.2*  HCT 35.6* 30.9* 30.0* 34.9*  MCV 94.4 95.1 98.4 96.7  PLT 458* 370 341 333   Cardiac Enzymes:   No results for input(s): CKTOTAL, CKMB, CKMBINDEX, TROPONINI in the last 168 hours. BNP (last 3 results) No results for input(s): BNP in the last 8760 hours.  ProBNP (last 3 results) No results for input(s): PROBNP in the last 8760  hours.  CBG: No results for input(s): GLUCAP in the last 168 hours.  Recent Results (from the past 240 hour(s))  Stool culture     Status: None   Collection Time: 01/06/15  5:51 PM  Result Value Ref Range Status   Specimen Description STOOL  Final   Special Requests NONE  Final   Culture   Final    NO SALMONELLA, SHIGELLA, CAMPYLOBACTER, YERSINIA, OR E.COLI 0157:H7 ISOLATED Performed at [facility]    Report Status 01/10/2015 FINAL  Final  Clostridium Difficile by PCR (not at [facility])     Status: None   Collection Time: 01/06/15  6:20 PM  Result Value Ref Range Status   Toxigenic C Difficile by pcr NEGATIVE NEGATIVE Final     Studies: No results found.  Scheduled  Meds: . cholecalciferol  2,000 Units Oral Daily  . levofloxacin (LEVAQUIN) IV  500 mg Intravenous Q24H  . losartan  50 mg Oral Daily  . [START ON 01/13/2015] senna-docusate  1 tablet Oral QHS  . sertraline  100 mg Oral q morning - 10a  . vitamin B-12  1,000 mcg Oral Daily    Continuous Infusions: . sodium chloride 75 mL/hr at 01/12/15 0300     Time spent: 12mins  Laderius Valbuena MD, PhD  [facility] Pager [phone]. If 7PM-7AM, please contact night-coverage at [email or web address], password [facility] 01/12/2015, 10:22 AM  LOS: 6 days

## 2015-01-13 LAB — CBC
HCT: 32.2 % — ABNORMAL LOW (ref 36.0–46.0)
Hemoglobin: 10.3 g/dL — ABNORMAL LOW (ref 12.0–15.0)
MCH: 31.6 pg (ref 26.0–34.0)
MCHC: 32 g/dL (ref 30.0–36.0)
MCV: 98.8 fL (ref 78.0–100.0)
Platelets: 282 10*3/uL (ref 150–400)
RBC: 3.26 MIL/uL — AB (ref 3.87–5.11)
RDW: 14.4 % (ref 11.5–15.5)
WBC: 5.7 10*3/uL (ref 4.0–10.5)

## 2015-01-13 LAB — COMPREHENSIVE METABOLIC PANEL
ALBUMIN: 3 g/dL — AB (ref 3.5–5.0)
ALT: 13 U/L — ABNORMAL LOW (ref 14–54)
AST: 22 U/L (ref 15–41)
Alkaline Phosphatase: 58 U/L (ref 38–126)
Anion gap: 7 (ref 5–15)
BILIRUBIN TOTAL: 0.5 mg/dL (ref 0.3–1.2)
BUN: 13 mg/dL (ref 6–20)
CALCIUM: 8.5 mg/dL — AB (ref 8.9–10.3)
CHLORIDE: 105 mmol/L (ref 101–111)
CO2: 26 mmol/L (ref 22–32)
CREATININE: 0.8 mg/dL (ref 0.44–1.00)
GFR calc Af Amer: >60 mL/min (ref >60)
GFR calc non Af Amer: >60 mL/min (ref >60)
GLUCOSE: 129 mg/dL — AB (ref 65–99)
Potassium: 4.1 mmol/L (ref 3.5–5.1)
Sodium: 138 mmol/L (ref 135–145)
Total Protein: 5.8 g/dL — ABNORMAL LOW (ref 6.5–8.1)

## 2015-01-13 MED ORDER — LOSARTAN POTASSIUM 50 MG PO TABS: 50.0000 mg | ORAL_TABLET | Freq: Every day | ORAL | Status: DC

## 2015-01-13 MED ORDER — LEVOFLOXACIN 750 MG PO TABS: 750.0000 mg | ORAL_TABLET | Freq: Every day | ORAL | Status: DC

## 2015-01-13 NOTE — Progress Notes (Signed)
Physical Therapy Treatment Patient Details Name: BRENDALIZ KUK MRN: [id number] DOB: 1928/08/22 Today's Date: 01/13/2015    History of Present Illness 81 yo female admitted with s/p hemorrhoidopexy, N/V/D. Hx of dementia, HTN, fibromyalgia. Pt is from home with caregivers.    PT Comments    Progressing slowly with mobility likely due to pt not ambulating in hallways outside of PT visits.   Follow Up Recommendations  Supervision/Assistance - 24 hour;No PT follow up     Equipment Recommendations  None recommended by PT    Recommendations for Other Services       Precautions / Restrictions Precautions Precautions: Fall Restrictions Weight Bearing Restrictions: No    Mobility  Bed Mobility Overal bed mobility: Needs Assistance       Supine to sit: Min assist Sit to supine: Min guard   General bed mobility comments: Assist for trunk to upright  Transfers Overall transfer level: Needs assistance   Transfers: Sit to/from Stand Sit to Stand: Min assist         General transfer comment: assist to rise, steady  Ambulation/Gait Ambulation/Gait assistance: Min assist Ambulation Distance (Feet): 165 Feet Assistive device: 1 person hand held assist       General Gait Details: slow gait speed. unsteady but no overt LOB. 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[employer] (Stroke Patients Only)       Balance                                    Cognition Arousal/Alertness: Awake/alert   Overall Cognitive Status: History of cognitive impairments - at baseline                      Exercises      General Comments        Pertinent Vitals/Pain Pain Assessment: Faces Faces Pain Scale: Hurts little more Pain Location: bil sides, bottom Pain Descriptors / Indicators: Sore Pain Intervention(s): Monitored during session;Repositioned    Home Living                      Prior Function             PT Goals (current goals can now be found in the care plan section) Progress towards PT goals: Progressing toward goals    Frequency  Min 3X/week    PT Plan Current plan remains appropriate    Co-evaluation             End of Session Equipment Utilized During Treatment: Gait belt Activity Tolerance: Patient limited by fatigue Patient left: in bed;with call bell/phone within reach;with bed alarm set     Time: 1123-1141 PT Time Calculation (min) (ACUTE ONLY): 18 min  Charges:  $Gait Training: 8-22 mins                    G Codes:      [name], MPT Pager: [phone]

## 2015-01-13 NOTE — Care Management Important Message (Signed)
Important Message  Patient Details  Name: TRANIKA SCHOLLER MRN: [id number] Date of Birth: 11/10/1928   Medicare Important Message Given:  Yes-third notification given    Camillo Flaming 01/13/2015, 12:47 [place] Message  Patient Details  Name: GUSTIE BOBB MRN: [id number] Date of Birth: [birth year]   Medicare Important Message Given:  Yes-third notification given    Camillo Flaming 01/13/2015, 12:47 PM

## 2015-01-15 LAB — URINE CULTURE: Culture: >=100000

## 2015-08-15 DIAGNOSIS — H524 Presbyopia: Secondary | ICD-10-CM | POA: Diagnosis not present

## 2015-08-18 ENCOUNTER — Other Ambulatory Visit: Payer: Self-pay | Admitting: Internal Medicine

## 2015-08-18 DIAGNOSIS — Z853 Personal history of malignant neoplasm of breast: Secondary | ICD-10-CM

## 2015-08-19 DIAGNOSIS — L0291 Cutaneous abscess, unspecified: Secondary | ICD-10-CM | POA: Diagnosis not present

## 2015-08-19 DIAGNOSIS — L820 Inflamed seborrheic keratosis: Secondary | ICD-10-CM | POA: Diagnosis not present

## 2015-08-25 ENCOUNTER — Inpatient Hospital Stay: Admission: RE | Admit: 2015-08-25 | Payer: Medicare Other | Source: Ambulatory Visit

## 2015-08-25 DIAGNOSIS — L820 Inflamed seborrheic keratosis: Secondary | ICD-10-CM | POA: Diagnosis not present

## 2015-09-29 DIAGNOSIS — G4700 Insomnia, unspecified: Secondary | ICD-10-CM | POA: Diagnosis not present

## 2015-09-29 DIAGNOSIS — F419 Anxiety disorder, unspecified: Secondary | ICD-10-CM | POA: Diagnosis not present

## 2015-10-01 ENCOUNTER — Ambulatory Visit
Admission: RE | Admit: 2015-10-01 | Discharge: 2015-10-01 | Disposition: A | Discharge status: Home / Self Care | Payer: Medicare Other | Source: Ambulatory Visit | Attending: Internal Medicine | Admitting: Internal Medicine

## 2015-10-01 DIAGNOSIS — Z853 Personal history of malignant neoplasm of breast: Secondary | ICD-10-CM

## 2015-10-01 DIAGNOSIS — R921 Mammographic calcification found on diagnostic imaging of breast: Secondary | ICD-10-CM | POA: Diagnosis not present

## 2015-10-02 ENCOUNTER — Inpatient Hospital Stay: Admission: RE | Admit: 2015-10-02 | Payer: Medicare Other | Source: Ambulatory Visit

## 2015-11-04 DIAGNOSIS — L089 Local infection of the skin and subcutaneous tissue, unspecified: Secondary | ICD-10-CM | POA: Diagnosis not present

## 2015-11-04 DIAGNOSIS — L03031 Cellulitis of right toe: Secondary | ICD-10-CM | POA: Diagnosis not present

## 2015-11-12 DIAGNOSIS — M79671 Pain in right foot: Secondary | ICD-10-CM | POA: Diagnosis not present

## 2015-11-12 DIAGNOSIS — L03031 Cellulitis of right toe: Secondary | ICD-10-CM | POA: Diagnosis not present

## 2015-11-20 ENCOUNTER — Emergency Department (HOSPITAL_COMMUNITY): Payer: Medicare Other

## 2015-11-20 ENCOUNTER — Inpatient Hospital Stay (HOSPITAL_COMMUNITY)
Admission: EM | Admit: 2015-11-20 | Discharge: 2015-11-23 | DRG: 378 | Disposition: A | Discharge status: Home / Self Care | Payer: Medicare Other | Attending: Internal Medicine | Admitting: Internal Medicine

## 2015-11-20 ENCOUNTER — Encounter (HOSPITAL_COMMUNITY): Payer: Self-pay | Admitting: Emergency Medicine

## 2015-11-20 DIAGNOSIS — Z8261 Family history of arthritis: Secondary | ICD-10-CM

## 2015-11-20 DIAGNOSIS — Z90710 Acquired absence of both cervix and uterus: Secondary | ICD-10-CM

## 2015-11-20 DIAGNOSIS — K5733 Diverticulitis of large intestine without perforation or abscess with bleeding: Principal | ICD-10-CM | POA: Diagnosis present

## 2015-11-20 DIAGNOSIS — Z781 Physical restraint status: Secondary | ICD-10-CM

## 2015-11-20 DIAGNOSIS — K625 Hemorrhage of anus and rectum: Secondary | ICD-10-CM | POA: Diagnosis not present

## 2015-11-20 DIAGNOSIS — K922 Gastrointestinal hemorrhage, unspecified: Secondary | ICD-10-CM | POA: Diagnosis not present

## 2015-11-20 DIAGNOSIS — Z853 Personal history of malignant neoplasm of breast: Secondary | ICD-10-CM

## 2015-11-20 DIAGNOSIS — D509 Iron deficiency anemia, unspecified: Secondary | ICD-10-CM | POA: Diagnosis not present

## 2015-11-20 DIAGNOSIS — K5732 Diverticulitis of large intestine without perforation or abscess without bleeding: Secondary | ICD-10-CM | POA: Insufficient documentation

## 2015-11-20 DIAGNOSIS — K5793 Diverticulitis of intestine, part unspecified, without perforation or abscess with bleeding: Secondary | ICD-10-CM | POA: Diagnosis not present

## 2015-11-20 DIAGNOSIS — Z79899 Other long term (current) drug therapy: Secondary | ICD-10-CM | POA: Diagnosis not present

## 2015-11-20 DIAGNOSIS — E869 Volume depletion, unspecified: Secondary | ICD-10-CM | POA: Diagnosis present

## 2015-11-20 DIAGNOSIS — E785 Hyperlipidemia, unspecified: Secondary | ICD-10-CM | POA: Diagnosis not present

## 2015-11-20 DIAGNOSIS — M797 Fibromyalgia: Secondary | ICD-10-CM | POA: Diagnosis not present

## 2015-11-20 DIAGNOSIS — G4733 Obstructive sleep apnea (adult) (pediatric): Secondary | ICD-10-CM | POA: Diagnosis present

## 2015-11-20 DIAGNOSIS — Z85828 Personal history of other malignant neoplasm of skin: Secondary | ICD-10-CM

## 2015-11-20 DIAGNOSIS — F0391 Unspecified dementia with behavioral disturbance: Secondary | ICD-10-CM | POA: Diagnosis not present

## 2015-11-20 DIAGNOSIS — K219 Gastro-esophageal reflux disease without esophagitis: Secondary | ICD-10-CM | POA: Diagnosis not present

## 2015-11-20 DIAGNOSIS — K5791 Diverticulosis of intestine, part unspecified, without perforation or abscess with bleeding: Secondary | ICD-10-CM

## 2015-11-20 DIAGNOSIS — F0390 Unspecified dementia without behavioral disturbance: Secondary | ICD-10-CM | POA: Diagnosis not present

## 2015-11-20 DIAGNOSIS — I10 Essential (primary) hypertension: Secondary | ICD-10-CM | POA: Diagnosis not present

## 2015-11-20 DIAGNOSIS — Z808 Family history of malignant neoplasm of other organs or systems: Secondary | ICD-10-CM | POA: Diagnosis not present

## 2015-11-20 DIAGNOSIS — K5730 Diverticulosis of large intestine without perforation or abscess without bleeding: Secondary | ICD-10-CM | POA: Diagnosis not present

## 2015-11-20 DIAGNOSIS — R1030 Lower abdominal pain, unspecified: Secondary | ICD-10-CM | POA: Diagnosis not present

## 2015-11-20 DIAGNOSIS — Z9049 Acquired absence of other specified parts of digestive tract: Secondary | ICD-10-CM | POA: Diagnosis not present

## 2015-11-20 DIAGNOSIS — Z923 Personal history of irradiation: Secondary | ICD-10-CM | POA: Diagnosis not present

## 2015-11-20 LAB — COMPREHENSIVE METABOLIC PANEL
ALBUMIN: 3.7 g/dL (ref 3.5–5.0)
ALT: 13 U/L — ABNORMAL LOW (ref 14–54)
ANION GAP: 9 (ref 5–15)
AST: 36 U/L (ref 15–41)
Alkaline Phosphatase: 84 U/L (ref 38–126)
BUN: 16 mg/dL (ref 6–20)
CALCIUM: 8.8 mg/dL — AB (ref 8.9–10.3)
CHLORIDE: 106 mmol/L (ref 101–111)
CO2: 22 mmol/L (ref 22–32)
Creatinine, Ser: 0.94 mg/dL (ref 0.44–1.00)
GFR calc non Af Amer: 54 mL/min — ABNORMAL LOW (ref >60)
GLUCOSE: 147 mg/dL — AB (ref 65–99)
POTASSIUM: 4.6 mmol/L (ref 3.5–5.1)
SODIUM: 137 mmol/L (ref 135–145)
Total Bilirubin: 1.1 mg/dL (ref 0.3–1.2)
Total Protein: 6.7 g/dL (ref 6.5–8.1)

## 2015-11-20 LAB — CBC
HEMATOCRIT: 42.1 % (ref 36.0–46.0)
HEMOGLOBIN: 14.3 g/dL (ref 12.0–15.0)
MCH: 31.4 pg (ref 26.0–34.0)
MCHC: 34 g/dL (ref 30.0–36.0)
MCV: 92.5 fL (ref 78.0–100.0)
Platelets: 198 10*3/uL (ref 150–400)
RBC: 4.55 MIL/uL (ref 3.87–5.11)
RDW: 12.7 % (ref 11.5–15.5)
WBC: 8.6 10*3/uL (ref 4.0–10.5)

## 2015-11-20 LAB — MAGNESIUM: Magnesium: 1.8 mg/dL (ref 1.7–2.4)

## 2015-11-20 LAB — TYPE AND SCREEN
ABO/RH(D): O POS
Antibody Screen: NEGATIVE

## 2015-11-20 LAB — HEMOGLOBIN AND HEMATOCRIT, BLOOD
HCT: 36.9 % (ref 36.0–46.0)
Hemoglobin: 12.5 g/dL (ref 12.0–15.0)

## 2015-11-20 LAB — PHOSPHORUS: Phosphorus: 4.2 mg/dL (ref 2.5–4.6)

## 2015-11-20 MED ORDER — IOPAMIDOL (ISOVUE-300) INJECTION 61%
100.0000 mL | Freq: Once | INTRAVENOUS | Status: AC | PRN
  Administered 2015-11-20: 100 mL via INTRAVENOUS

## 2015-11-20 MED ORDER — ONDANSETRON HCL 4 MG/2ML IJ SOLN
4.0000 mg | Freq: Four times a day (QID) | INTRAMUSCULAR | Status: DC | PRN
  Administered 2015-11-20: 4 mg via INTRAVENOUS
  Filled 2015-11-20: qty 2

## 2015-11-20 MED ORDER — SODIUM CHLORIDE 0.9 % IV SOLN: INTRAVENOUS | Status: AC

## 2015-11-20 MED ORDER — LOSARTAN POTASSIUM 50 MG PO TABS
50.0000 mg | ORAL_TABLET | Freq: Every day | ORAL | Status: DC
  Administered 2015-11-20 – 2015-11-23 (×4): 50 mg via ORAL
  Filled 2015-11-20 (×4): qty 1

## 2015-11-20 MED ORDER — DIPHENHYDRAMINE HCL 12.5 MG/5ML PO ELIX
12.5000 mg | ORAL_SOLUTION | Freq: Every evening | ORAL | Status: DC | PRN
  Administered 2015-11-20 – 2015-11-22 (×3): 12.5 mg via ORAL
  Filled 2015-11-20 (×3): qty 5

## 2015-11-20 MED ORDER — VITAMIN D 1000 UNITS PO TABS
2000.0000 [IU] | ORAL_TABLET | Freq: Every day | ORAL | Status: DC
  Administered 2015-11-20 – 2015-11-23 (×4): 2000 [IU] via ORAL
  Filled 2015-11-20 (×4): qty 2

## 2015-11-20 MED ORDER — METRONIDAZOLE IN NACL 5-0.79 MG/ML-% IV SOLN
500.0000 mg | Freq: Once | INTRAVENOUS | Status: AC
  Administered 2015-11-20: 500 mg via INTRAVENOUS
  Filled 2015-11-20: qty 100

## 2015-11-20 MED ORDER — CIPROFLOXACIN IN D5W 400 MG/200ML IV SOLN
400.0000 mg | Freq: Two times a day (BID) | INTRAVENOUS | Status: DC
  Administered 2015-11-20 – 2015-11-23 (×6): 400 mg via INTRAVENOUS
  Filled 2015-11-20 (×7): qty 200

## 2015-11-20 MED ORDER — CIPROFLOXACIN IN D5W 400 MG/200ML IV SOLN
400.0000 mg | Freq: Once | INTRAVENOUS | Status: AC
  Administered 2015-11-20: 400 mg via INTRAVENOUS
  Filled 2015-11-20: qty 200

## 2015-11-20 MED ORDER — DIATRIZOATE MEGLUMINE & SODIUM 66-10 % PO SOLN
15.0000 mL | Freq: Once | ORAL | Status: AC
Start: 2015-11-20 — End: 2015-11-20
  Administered 2015-11-20: 15 mL via ORAL

## 2015-11-20 MED ORDER — METRONIDAZOLE IN NACL 5-0.79 MG/ML-% IV SOLN
500.0000 mg | Freq: Three times a day (TID) | INTRAVENOUS | Status: DC
  Administered 2015-11-20 – 2015-11-23 (×8): 500 mg via INTRAVENOUS
  Filled 2015-11-20 (×10): qty 100

## 2015-11-20 MED ORDER — VITAMIN B-12 1000 MCG PO TABS
1000.0000 ug | ORAL_TABLET | Freq: Every day | ORAL | Status: DC
  Administered 2015-11-20 – 2015-11-23 (×4): 1000 ug via ORAL
  Filled 2015-11-20 (×4): qty 1

## 2015-11-20 MED ORDER — SODIUM CHLORIDE 0.9 % IV SOLN
INTRAVENOUS | Status: DC
  Administered 2015-11-20 – 2015-11-22 (×3): via INTRAVENOUS

## 2015-11-20 MED ORDER — ACETAMINOPHEN 325 MG PO TABS
650.0000 mg | ORAL_TABLET | Freq: Four times a day (QID) | ORAL | Status: DC | PRN
  Administered 2015-11-20: 650 mg via ORAL
  Filled 2015-11-20: qty 2

## 2015-11-20 MED ORDER — SERTRALINE HCL 50 MG PO TABS
100.0000 mg | ORAL_TABLET | Freq: Every morning | ORAL | Status: DC
  Administered 2015-11-20 – 2015-11-23 (×4): 100 mg via ORAL
  Filled 2015-11-20 (×4): qty 2

## 2015-11-20 NOTE — Care Management Note (Signed)
Case Management Note  Patient Details  Name: Kimberly Acevedo MRN: [id number] Date of Birth: Jun 14, 1928  Subjective/Objective:  82 y/o f admitted w/Diverticulitis. From home alone. PT cons-await recc.                  Action/Plan:d/c plan home.   Expected Discharge Date:   (unknown)               Expected Discharge Plan:  [place]  In-House Referral:     Discharge planning Services  CM Consult  Post Acute Care Choice:    Choice offered to:     DME Arranged:    DME Agency:     HH Arranged:    HH Agency:     Status of Service:  In process, will continue to follow  Medicare Important Message Given:    Date Medicare IM Given:    Medicare IM give by:    Date Additional Medicare IM Given:    Additional Medicare Important Message give by:     If discussed at [place] of Stay Meetings, dates discussed:    Additional Comments:  Dessa Phi, RN 11/20/2015, 3:19 PM

## 2015-11-20 NOTE — ED Notes (Signed)
Attempted to call report, RN unavailable.

## 2015-11-20 NOTE — ED Notes (Signed)
Bed: WA02 Expected date:  Expected time:  Means of arrival:  Comments: 1F/rectal bleeding

## 2015-11-20 NOTE — H&P (Signed)
History and Physical  Kimberly Acevedo [id number] DOB: Feb 05, 1929 DOA: 11/20/2015  Referring physician: ER Physician PCP: [name], MD  Outpatient Specialists:    Patient coming from: Home  Chief Complaint: Bright red blood per rectum.  HPI: 82 year old female with history of diverticulosis, diverticulitis, dementia and hyperlipidemia. Patient is not able to give any history due to dementing illness. Patient was seen alongside patient's care giver. The history has come from the caregiver who also was given second hand information. Patient presents with bright red blood per rectum that started this morning. No associated abdominal pain, no syncope or dizziness, no other associated constitutional symptoms. Vitals are stable. Hemoglobin is 14.3g/dl. CT scan done on admission revealed colonic diverticulosis and sigmoid diverticulitis. ER Physician has already consulted the GI Team.  ED Course: IV antibiotics and IVF.  Pertinent labs: CT abdomen finding.  Review of Systems: Unobtainable   Past Medical History  Diagnosis Date  . Fibromyalgia   . Dyslipidemia   . Hypertension   . Hemorrhoids   . Arthritis   . OSA on CPAP   . History of diverticulitis of colon   . GERD (gastroesophageal reflux disease)   . History of breast cancer oncologist-  dr Humphrey Rolls--- no recurrence    dx 05-19-2012  Stage I , Invasive Lobular/ LCIS & microinvasive ductal and DCIS (T1 A NX M0)--  s/p  left partial mastectomy 07-04-2012 and radiation  . History of radiation therapy 08-10-2012 to 09-08-2012    Left breast 750 cGy in 3 sessions  . Prolapsed internal hemorrhoids, grade 3   . Varicose veins   . Cognitive dysfunction     DUE TO DEMENTIA--  PER NEUROLOGIST NOTE SEVERE  . Dementia with behavioral disturbance     PER NEUROLOGIST NOTE PT ABLE TO DO ADL'S BUT UNABLE TO CARE FOR HOME AND NEEDED AND PT HAS 24 HOUR CARE  . History of squamous cell carcinoma excision     NOSE  . Bilateral edema of  lower extremity   . Anal fissure     Past Surgical History  Procedure Laterality Date  . Rectocele repair    . Esophageal dilation    . Cholecystectomy  2007  . Colonoscopy    . Breast lumpectomy with needle localization  07/04/2012    Procedure: BREAST LUMPECTOMY WITH NEEDLE LOCALIZATION;  Surgeon: Edward Jolly, MD;  Location: [place];  Service: General;  Laterality: Left;  left  . Varicose vein surgery  2010    left lower extremity  . Hemorrhoid banding  Mar & Apr 2016  . Total abdominal hysterectomy  1976  . Hemorrhoid surgery N/A 12/25/2014    Procedure: HEMORRHOIDOPEXY WITH ANAL EXAM;  Surgeon: Leighton Ruff, MD;  Location: [facility];  Service: General;  Laterality: N/A;     reports that she has never smoked. She has never used smokeless tobacco. She reports that she does not drink alcohol or use illicit drugs.  Allergies  Allergen Reactions  . Penicillins Swelling and Rash    Has patient had a PCN reaction causing immediate rash, facial/tongue/throat swelling, SOB or lightheadedness with hypotension: No Has patient had a PCN reaction causing severe rash involving mucus membranes or skin necrosis: No Has patient had a PCN reaction that required hospitalization No Has patient had a PCN reaction occurring within the last 10 years: No If all of the above answers are "NO", then may proceed with Cephalosporin use.  . Statins   .  Sulfa Drugs Cross Reactors Shortness Of Breath  . Tramadol Hcl Anaphylaxis  . Aspirin Swelling  . Codeine Other (See Comments)    unknown  . Morphine And Related Other (See Comments)    Hypoglycemia    Family History  Problem Relation Age of Onset  . Cancer Mother 55    melanoma ca  . Arthritis Father   . Dementia Neg Hx      Prior to Admission medications   Medication Sig Start Date End Date Taking? Authorizing Provider  Cholecalciferol (VITAMIN D) 2000 UNITS CAPS Take 1 capsule by mouth daily.   Yes  Historical Provider, MD  losartan (COZAAR) 50 MG tablet Take 1 tablet (50 mg total) by mouth daily. 01/13/15  Yes Florencia Reasons, MD  sertraline (ZOLOFT) 100 MG tablet Take 100 mg by mouth every morning.    Yes Historical Provider, MD  vitamin B-12 (CYANOCOBALAMIN) 1000 MCG tablet Take 1,000 mcg by mouth daily.   Yes Historical Provider, MD    Physical Exam: Filed Vitals:   11/20/15 0746  BP: 129/71  Pulse: 81  Temp: 97.6 F (36.4 C)  TempSrc: Oral  Resp: 16  SpO2: 95%   Constitutional:  . Appears calm and comfortable Eyes:  . No pallor. No jaundice.  ENMT:  . Dry buccal Mucosa.  Neck:  . Neck is supple. No JVD Respiratory:  . CTA bilaterally, no w/r/r.  . Respiratory effort normal. No retractions or accessory muscle use Cardiovascular:  . S1S2 . No LE extremity edema   Abdomen:  . Abdomen is soft and non tender. Organs are difficult to assess. Neurologic:  . Awake and alert. . Moves all limbs.  Wt Readings from Last 3 Encounters:  01/06/15 73.1 kg (161 lb 2.5 oz)  12/25/14 75.751 kg (167 lb)  12/19/14 73.936 kg (163 lb)    I have personally reviewed following labs.  Labs on Admission:  CBC:  Recent Labs Lab 11/20/15 0800  WBC 8.6  HGB 14.3  HCT 42.1  MCV 92.5  PLT 99991111   Basic Metabolic Panel:  Recent Labs Lab 11/20/15 0800  NA 137  K 4.6  CL 106  CO2 22  GLUCOSE 147*  BUN 16  CREATININE 0.94  CALCIUM 8.8*   Liver Function Tests:  Recent Labs Lab 11/20/15 0800  AST 36  ALT 13*  ALKPHOS 84  BILITOT 1.1  PROT 6.7  ALBUMIN 3.7   No results for input(s): LIPASE, AMYLASE in the last 168 hours. No results for input(s): AMMONIA in the last 168 hours. Coagulation Profile: No results for input(s): INR, PROTIME in the last 168 hours. Cardiac Enzymes: No results for input(s): CKTOTAL, CKMB, CKMBINDEX, TROPONINI in the last 168 hours. BNP (last 3 results) No results for input(s): PROBNP in the last 8760 hours. HbA1C: No results for input(s):  HGBA1C in the last 72 hours. CBG: No results for input(s): GLUCAP in the last 168 hours. Lipid Profile: No results for input(s): CHOL, HDL, LDLCALC, TRIG, CHOLHDL, LDLDIRECT in the last 72 hours. Thyroid Function Tests: No results for input(s): TSH, T4TOTAL, FREET4, T3FREE, THYROIDAB in the last 72 hours. Anemia Panel: No results for input(s): VITAMINB12, FOLATE, FERRITIN, TIBC, IRON, RETICCTPCT in the last 72 hours. Urine analysis:    Component Value Date/Time   COLORURINE YELLOW 01/11/2015 1030   APPEARANCEUR TURBID* 01/11/2015 1030   LABSPEC 1.016 01/11/2015 1030   PHURINE 8.0 01/11/2015 1030   GLUCOSEU NEGATIVE 01/11/2015 1030   HGBUR LARGE* 01/11/2015 1030   BILIRUBINUR NEGATIVE  01/11/2015 1030   KETONESUR NEGATIVE 01/11/2015 1030   PROTEINUR 100* 01/11/2015 1030   UROBILINOGEN 1.0 01/11/2015 1030   NITRITE NEGATIVE 01/11/2015 1030   LEUKOCYTESUR LARGE* 01/11/2015 1030   Sepsis Labs: @LABRCNTIP (procalcitonin:4,lacticidven:4) )No results found for this or any previous visit (from the past 240 hour(s)).    Radiological Exams on Admission: Ct Abdomen Pelvis W Contrast  11/20/2015  CLINICAL DATA:  Per [facility] pt from home, pt. C/o bright red rectal bleeding with clots Per care giver, started 1 hour ago. Pt reports weakness and near syncope, Hx of dementia, IBS, hemorrhoids, anal skin tags, and diverticulitis EXAM: CT ABDOMEN AND PELVIS WITH CONTRAST TECHNIQUE: Multidetector CT imaging of the abdomen and pelvis was performed using the standard protocol following bolus administration of intravenous contrast. CONTRAST:  147mL ISOVUE-300 IOPAMIDOL (ISOVUE-300) INJECTION 61% COMPARISON:  01/06/2015 FINDINGS: Lower chest: There is mild subsegmental pleural-based atelectasis or scarring at the bases, right greater than left. No suspicious pulmonary nodules or pleural effusions. Trace pericardial effusion noted. Hepatobiliary: No focal abnormality identified within the liver. Gallbladder is  surgically absent. Pancreas: No focal abnormality identified within the pancreas. Spleen: Spleen is homogeneous and normal in appearance. Adrenals/Urinary Tract: Adrenal glands are normal in appearance. There is no hydronephrosis. Upper pole left renal cyst is present. Stomach/Bowel: Moderate hiatal hernia is present. The stomach and small bowel loops are normal in appearance. The appendix is well seen and has a normal appearance. There are significant colonic diverticula. There is thickening of the peritoneal reflection adjacent to the left pericolic gutter and mild inflammatory change surrounding the descending colon, consistent with acute diverticulitis. No associated perforation or abscess present. Vascular/Lymphatic: There is extensive atherosclerotic calcification of the abdominal aorta. The no aneurysm. Reproductive: Status post hysterectomy. No adnexal mass. No free pelvic fluid. Other: Within the left ischiorectal space, there is a 1.7 x 1.2 centimeter residual collections following presumed previous hematoma which previously measured greater than 7 centimeters. No new or suspicious mass identified in this region. Musculoskeletal: Degenerative changes are identified in the lower thoracic and lumbar spine. No suspicious lytic or blastic lesions are identified. IMPRESSION: 1. Colonic diverticulosis and sigmoid diverticulitis not associated with abscess or perforation. 2. Significant improvement and left perirectal hematoma since prior study, now measuring 1.7 x 1.2 centimeters. 3. Trace pericardial effusion. 4. Status post cholecystectomy. 5. Left renal cyst. 6. Moderate hiatal hernia. Electronically Signed   By: Nolon Nations M.D.   On: 11/20/2015 10:47    Active Problems:   GI bleed   Assessment/Plan 1. Rectal bleed, likely diverticular bleed 2. Sigmoid diverticulitis 3. Volume depletion 4. Dementia   Admit patient to telemetry unit  Monitor H/H  NPO  IV antibiotics  IVF  GI  already consulted  Further management will depend on hospital course  DVT prophylaxis: SCD Code Status: Full until reviewed further Family Communication:  Disposition Plan: Home eventually   Consults called: GI already consulted by ER Physician   Admission status: Inpatient    Time spent: 60 minutes  Dana Allan, MD  [facility] Pager #: [phone] 7PM-7AM contact night coverage as above   11/20/2015, 11:41 AM

## 2015-11-20 NOTE — Consult Note (Signed)
Consultation  Referring Provider: Dr. Marthenia Rolling     Primary Care Physician:  [name], MD Primary Gastroenterologist:  Dr. Ardis Hughs       Reason for Consultation: Rectal Bleeding              HPI:   Kimberly Acevedo is a 82 y.o. Caucasian female with history of dementia, diverticulosis, diverticulitis, HLD, anal fissure and chronic constipation. Patient is unable to give history due to dementia. History is garnered from previous notes and caregiver who is at pt bedside. Apparently, pt presented with bright red blood per rectum that started this morning. The caregiver denies any change in bowel habits or abdominal pain leading up to this morning. This is the first time she has had any bleeding in "almost a year". She does discuss her previous history of hemorrhoids. The denies any other associated symptoms and explains that the patient has been "fine" at home in the recent past.  ER Course: CT done at time of admission (compared with previous on 01/06/15)- showed:  diverticulosis and sigmoid diverticulitis not associated with abscess or perforation, significant improvement in left perirectal hematoma since prior study, now measuring 1.7 x 1.2 cm, trace pericardial effusion, s/p cholecystectomy, left renal cyst and mod hiatal hernia. Vitals stable. Hgb 14.3.WBC normal at 8.6.   Previous Workup: 12/19/14-OV-Amy Esterwood PA: Still reported some occasional rectal bleeding, thought to have been from anal fissure-referral to Dr. Marcello Moores 12/25/14-Dr. Thomas-hemorrhoidoplexy x3  Past Medical History  Diagnosis Date  . Fibromyalgia   . Dyslipidemia   . Hypertension   . Hemorrhoids   . Arthritis   . OSA on CPAP   . History of diverticulitis of colon   . GERD (gastroesophageal reflux disease)   . History of breast cancer oncologist-  dr Humphrey Rolls--- no recurrence    dx 05-19-2012  Stage I , Invasive Lobular/ LCIS & microinvasive ductal and DCIS (T1 A NX M0)--  s/p  left partial mastectomy  07-04-2012 and radiation  . History of radiation therapy 08-10-2012 to 09-08-2012    Left breast 750 cGy in 3 sessions  . Prolapsed internal hemorrhoids, grade 3   . Varicose veins   . Cognitive dysfunction     DUE TO DEMENTIA--  PER NEUROLOGIST NOTE SEVERE  . Dementia with behavioral disturbance     PER NEUROLOGIST NOTE PT ABLE TO DO ADL'S BUT UNABLE TO CARE FOR HOME AND NEEDED AND PT HAS 24 HOUR CARE  . History of squamous cell carcinoma excision     NOSE  . Bilateral edema of lower extremity   . Anal fissure     Past Surgical History  Procedure Laterality Date  . Rectocele repair    . Esophageal dilation    . Cholecystectomy  2007  . Colonoscopy    . Breast lumpectomy with needle localization  07/04/2012    Procedure: BREAST LUMPECTOMY WITH NEEDLE LOCALIZATION;  Surgeon: Edward Jolly, MD;  Location: [place];  Service: General;  Laterality: Left;  left  . Varicose vein surgery  2010    left lower extremity  . Hemorrhoid banding  Mar & Apr 2016  . Total abdominal hysterectomy  1976  . Hemorrhoid surgery N/A 12/25/2014    Procedure: HEMORRHOIDOPEXY WITH ANAL EXAM;  Surgeon: Leighton Ruff, MD;  Location: [place];  Service: General;  Laterality: N/A;    Family History  Problem Relation Age of Onset  . Cancer Mother 42    melanoma ca  .  Arthritis Father   . Dementia Neg Hx     Social History  Substance Use Topics  . Smoking status: Never Smoker   . Smokeless tobacco: Never Used  . Alcohol Use: No    Prior to Admission medications   Medication Sig Start Date End Date Taking? Authorizing Provider  Cholecalciferol (VITAMIN D) 2000 UNITS CAPS Take 1 capsule by mouth daily.   Yes Historical Provider, MD  losartan (COZAAR) 50 MG tablet Take 1 tablet (50 mg total) by mouth daily. 01/13/15  Yes Florencia Reasons, MD  sertraline (ZOLOFT) 100 MG tablet Take 100 mg by mouth every morning.    Yes Historical Provider, MD  vitamin B-12 (CYANOCOBALAMIN)  1000 MCG tablet Take 1,000 mcg by mouth daily.   Yes Historical Provider, MD    Current Facility-Administered Medications  Medication Dose Route Frequency Provider Last Rate Last Dose  . 0.9 %  sodium chloride infusion   Intravenous STAT Carmin Muskrat, MD      . 0.9 %  sodium chloride infusion   Intravenous Continuous Bonnell Public, MD      . cholecalciferol (VITAMIN D) tablet 2,000 Units  2,000 Units Oral Daily Bonnell Public, MD      . ciprofloxacin (CIPRO) IVPB 400 mg  400 mg Intravenous Q12H Bonnell Public, MD      . losartan (COZAAR) tablet 50 mg  50 mg Oral Daily Bonnell Public, MD      . metroNIDAZOLE (FLAGYL) IVPB 500 mg  500 mg Intravenous Q8H Bonnell Public, MD      . sertraline (ZOLOFT) tablet 100 mg  100 mg Oral q morning - 10a Bonnell Public, MD      . vitamin B-12 (CYANOCOBALAMIN) tablet 1,000 mcg  1,000 mcg Oral Daily Bonnell Public, MD        Allergies as of 11/20/2015 - Review Complete 11/20/2015  Allergen Reaction Noted  . Penicillins Swelling and Rash 09/29/2010  . Statins  08/30/2011  . Sulfa drugs cross reactors Shortness Of Breath 09/29/2010  . Tramadol hcl Anaphylaxis 05/03/2011  . Aspirin Swelling 09/29/2010  . Codeine Other (See Comments) 09/29/2010  . Morphine and related Other (See Comments) 05/25/2014     Review of Systems:    Unable to obtain fur to mental status   Physical Exam:  Vital signs in last 24 hours: Temp:  [97.6 F (36.4 C)-97.9 F (36.6 C)] 97.9 F (36.6 C) (06/15 1142) Pulse Rate:  [66-81] 74 (06/15 1142) Resp:  [13-18] 18 (06/15 1000) BP: (122-162)/(70-104) 162/71 mmHg (06/15 1142) SpO2:  [95 %-99 %] 98 % (06/15 1142)   General:   Pleasant demented Caucasian female appears to be in NAD, Well developed, Well nourished, alert and cooperative Head:  Normocephalic and atraumatic. Eyes:   PEERL, EOMI. No icterus. Conjunctiva pink. Ears:  Normal auditory acuity. Neck:  Supple Throat: Oral cavity and  pharynx without inflammation, swelling or lesion. Lungs: Respirations even and unlabored. Lungs clear to auscultation bilaterally.   No wheezes, crackles, or rhonchi.  Heart: Normal S1, S2. No MRG. Regular rate and rhythm. No peripheral edema, cyanosis or pallor.  Abdomen:  Soft, nondistended, TTP LLQ with guarding  Normal bowel sounds. No appreciable masses or hepatomegaly. Rectal:  Not performed as patient using restroom at time of my interview Msk:  Symmetrical without gross deformities. Peripheral pulses intact.  Extremities:  Without edema, no deformity or joint abnormality. Normal ROM, normal sensation. Neurologic:  Alert and  oriented x4;  grossly  normal neurologically. CN II-XII intact.  Skin:   Dry and intact without significant lesions or rashes. Psychiatric: Demented   LAB RESULTS:  Recent Labs  11/20/15 0800 11/20/15 1207  WBC 8.6  --   HGB 14.3 12.5  HCT 42.1 36.9  PLT 198  --    BMET  Recent Labs  11/20/15 0800  NA 137  K 4.6  CL 106  CO2 22  GLUCOSE 147*  BUN 16  CREATININE 0.94  CALCIUM 8.8*   LFT  Recent Labs  11/20/15 0800  PROT 6.7  ALBUMIN 3.7  AST 36  ALT 13*  ALKPHOS 84  BILITOT 1.1   PT/INR No results for input(s): LABPROT, INR in the last 72 hours.  STUDIES: Ct Abdomen Pelvis W Contrast  11/20/2015  CLINICAL DATA:  Per [facility] pt from home, pt. C/o bright red rectal bleeding with clots Per care giver, started 1 hour ago. Pt reports weakness and near syncope, Hx of dementia, IBS, hemorrhoids, anal skin tags, and diverticulitis EXAM: CT ABDOMEN AND PELVIS WITH CONTRAST TECHNIQUE: Multidetector CT imaging of the abdomen and pelvis was performed using the standard protocol following bolus administration of intravenous contrast. CONTRAST:  169mL ISOVUE-300 IOPAMIDOL (ISOVUE-300) INJECTION 61% COMPARISON:  01/06/2015 FINDINGS: Lower chest: There is mild subsegmental pleural-based atelectasis or scarring at the bases, right greater than left. No  suspicious pulmonary nodules or pleural effusions. Trace pericardial effusion noted. Hepatobiliary: No focal abnormality identified within the liver. Gallbladder is surgically absent. Pancreas: No focal abnormality identified within the pancreas. Spleen: Spleen is homogeneous and normal in appearance. Adrenals/Urinary Tract: Adrenal glands are normal in appearance. There is no hydronephrosis. Upper pole left renal cyst is present. Stomach/Bowel: Moderate hiatal hernia is present. The stomach and small bowel loops are normal in appearance. The appendix is well seen and has a normal appearance. There are significant colonic diverticula. There is thickening of the peritoneal reflection adjacent to the left pericolic gutter and mild inflammatory change surrounding the descending colon, consistent with acute diverticulitis. No associated perforation or abscess present. Vascular/Lymphatic: There is extensive atherosclerotic calcification of the abdominal aorta. The no aneurysm. Reproductive: Status post hysterectomy. No adnexal mass. No free pelvic fluid. Other: Within the left ischiorectal space, there is a 1.7 x 1.2 centimeter residual collections following presumed previous hematoma which previously measured greater than 7 centimeters. No new or suspicious mass identified in this region. Musculoskeletal: Degenerative changes are identified in the lower thoracic and lumbar spine. No suspicious lytic or blastic lesions are identified. IMPRESSION: 1. Colonic diverticulosis and sigmoid diverticulitis not associated with abscess or perforation. 2. Significant improvement and left perirectal hematoma since prior study, now measuring 1.7 x 1.2 centimeters. 3. Trace pericardial effusion. 4. Status post cholecystectomy. 5. Left renal cyst. 6. Moderate hiatal hernia. Electronically Signed   By: Nolon Nations M.D.   On: 11/20/2015 10:47     PREVIOUS ENDOSCOPIES:            09/14/94- Colonoscopy- [facility]: diverticula and colon  polyps   Impression / Plan:  Impression: 1. Hematochezia: Per report from caregiver brbpr this morning and LLQ pain, consistent with diverticulitis seen on recent CT-pt continues with brbpr at time of my exam 2. Sigmoid diverticulitis: confirmed on CT abd/pelvis 3. LLQ abdominal pain: see above   Plan: 1. Agree with antibiotics 2. Continue supportive measures 3. Will discuss above with Dr. Havery Moros, please await any further recs  Thank you for your kind consultation, we will continue to follow.  Lavone Nian  Sherene Plancarte  11/20/2015, 12:36 PM Pager #: [phone]

## 2015-11-20 NOTE — ED Notes (Signed)
Per [facility] pt from home , co RLQ pain and bright red rectal bleeding with clots  Per care giver, started 1 hour ago. Pt reports weakness and near syncope. Alert and oriented to self,place, and situation yet not to time, Hx dementia.  Obvious large bright red bleeding and clots upon moving pt on bed. Hx IBS, hemorrhoids, anal skin tags, and diverticulitis.

## 2015-11-20 NOTE — ED Provider Notes (Signed)
CSN: [id number]     Arrival date & time 11/20/15  0746 History   First MD Initiated Contact with Patient 11/20/15 [phone]     Chief Complaint  Patient presents with  . Rectal Bleeding    HPI  Patient presents with caregiver who assists with the history of present illness. Patient has dementia, though she seems to relate her recent history well. Patient denies complaints, beyond rectal bleeding. Over the past few hours patient had multiple episodes of bright red blood per rectum No lightheadedness, no nausea, no syncope, no chest pain. Patient states that this episode is similar to when she experienced in the past, requiring hospitalization. Caregiver corroborates that the patient was generally well until today.   Past Medical History  Diagnosis Date  . Fibromyalgia   . Dyslipidemia   . Hypertension   . Hemorrhoids   . Arthritis   . OSA on CPAP   . History of diverticulitis of colon   . GERD (gastroesophageal reflux disease)   . History of breast cancer oncologist-  dr Humphrey Rolls--- no recurrence    dx 05-19-2012  Stage I , Invasive Lobular/ LCIS & microinvasive ductal and DCIS (T1 A NX M0)--  s/p  left partial mastectomy 07-04-2012 and radiation  . History of radiation therapy 08-10-2012 to 09-08-2012    Left breast 750 cGy in 3 sessions  . Prolapsed internal hemorrhoids, grade 3   . Varicose veins   . Cognitive dysfunction     DUE TO DEMENTIA--  PER NEUROLOGIST NOTE SEVERE  . Dementia with behavioral disturbance     PER NEUROLOGIST NOTE PT ABLE TO DO ADL'S BUT UNABLE TO CARE FOR HOME AND NEEDED AND PT HAS 24 HOUR CARE  . History of squamous cell carcinoma excision     NOSE  . Bilateral edema of lower extremity   . Anal fissure    Past Surgical History  Procedure Laterality Date  . Rectocele repair    . Esophageal dilation    . Cholecystectomy  2007  . Colonoscopy    . Breast lumpectomy with needle localization  07/04/2012    Procedure: BREAST LUMPECTOMY WITH NEEDLE  LOCALIZATION;  Surgeon: Edward Jolly, MD;  Location: [place];  Service: General;  Laterality: Left;  left  . Varicose vein surgery  2010    left lower extremity  . Hemorrhoid banding  Mar & Apr 2016  . Total abdominal hysterectomy  1976  . Hemorrhoid surgery N/A 12/25/2014    Procedure: HEMORRHOIDOPEXY WITH ANAL EXAM;  Surgeon: Leighton Ruff, MD;  Location: [place];  Service: General;  Laterality: N/A;   Family History  Problem Relation Age of Onset  . Cancer Mother 38    melanoma ca  . Arthritis Father   . Dementia Neg Hx    Social History  Substance Use Topics  . Smoking status: Never Smoker   . Smokeless tobacco: Never Used  . Alcohol Use: No   OB History    No data available     Review of Systems  Constitutional:       Per HPI, otherwise negative  HENT:       Per HPI, otherwise negative  Respiratory:       Per HPI, otherwise negative  Cardiovascular:       Per HPI, otherwise negative  Gastrointestinal: Positive for blood in stool. Negative for vomiting, diarrhea, constipation and rectal pain.  Endocrine:       Negative aside from HPI  Genitourinary:       Neg aside from HPI   Musculoskeletal:       Per HPI, otherwise negative  Skin: Negative.   Neurological: Negative for syncope and weakness.      Allergies  Penicillins; Statins; Sulfa drugs cross reactors; Tramadol hcl; Aspirin; Codeine; and Morphine and related  Home Medications   Prior to Admission medications   Medication Sig Start Date End Date Taking? Authorizing Provider  acetaminophen (TYLENOL) 325 MG tablet Take 650 mg by mouth every 6 (six) hours as needed.    Historical Provider, MD  Cholecalciferol (VITAMIN D) 2000 UNITS CAPS Take 1 capsule by mouth daily.    Historical Provider, MD  diltiazem 2 % GEL Apply 1 application topically 3 (three) times daily.    Historical Provider, MD  levofloxacin (LEVAQUIN) 750 MG tablet Take 1 tablet (750 mg total) by  mouth daily. 01/13/15   Florencia Reasons, MD  lidocaine (XYLOCAINE) 2 % jelly Apply 1 application topically as needed. Apply 4 times daily to the affected area. 12/19/14   Amy S Esterwood, PA-C  loperamide (IMODIUM A-D) 2 MG tablet Take 2 mg by mouth 4 (four) times daily as needed for diarrhea or loose stools.    Historical Provider, MD  losartan (COZAAR) 50 MG tablet Take 1 tablet (50 mg total) by mouth daily. 01/13/15   Florencia Reasons, MD  oxyCODONE (OXY IR/ROXICODONE) 5 MG immediate release tablet Take 1 tablet (5 mg total) by mouth every 6 (six) hours as needed for severe pain. [id number]   Leighton Ruff, MD  polyethylene glycol [facility] Flor[facility]aveling) packet Take 17 g by mouth daily as needed for mild constipation or moderate constipation. Use as directed until your bowel movements are soft and coming daily, then you may decrease to every other day to continue having daily soft BMs 05/22/14   Mercedes Camprubi-Soms, PA-C  Probiotic Product (ALIGN) 4 MG CAPS Take 1 capsule by mouth daily.    Historical Provider, MD  sertraline (ZOLOFT) 100 MG tablet Take 100 mg by mouth every morning.     Historical Provider, MD  vitamin B-12 (CYANOCOBALAMIN) 1000 MCG tablet Take 1,000 mcg by mouth daily.    Historical Provider, MD   BP 129/71 mmHg  Pulse 81  Temp(Src) 97.6 F (36.4 C) (Oral)  Resp 16  SpO2 95% Physical Exam  Constitutional: She is oriented to person, place, and time. She appears well-developed and well-nourished. No distress.  HENT:  Head: Normocephalic and atraumatic.  Eyes: Conjunctivae and EOM are normal.  Cardiovascular: Normal rate and regular rhythm.   Pulmonary/Chest: Effort normal and breath sounds normal. No stridor. No respiratory distress.  Abdominal: She exhibits no distension. There is tenderness in the left lower quadrant. There is guarding. There is no rigidity.  Genitourinary:  Bright red blood visible  Musculoskeletal: She exhibits no edema.  Neurological: She is alert and oriented to person,  place, and time. No cranial nerve deficit.  Skin: Skin is warm and dry.  Psychiatric: She has a normal mood and affect.  Nursing note and vitals reviewed.   ED Course  Procedures (including critical care time) Labs Review Labs Reviewed  COMPREHENSIVE METABOLIC PANEL - Abnormal; Notable for the following:    Glucose, Bld 147 (*)    Calcium 8.8 (*)    ALT 13 (*)    GFR calc non Af Amer 54 (*)    All other components within normal limits  CBC  TYPE AND SCREEN    Imaging Review  Ct Abdomen Pelvis W Contrast  11/20/2015  CLINICAL DATA:  Per [facility] pt from home, pt. C/o bright red rectal bleeding with clots Per care giver, started 1 hour ago. Pt reports weakness and near syncope, Hx of dementia, IBS, hemorrhoids, anal skin tags, and diverticulitis EXAM: CT ABDOMEN AND PELVIS WITH CONTRAST TECHNIQUE: Multidetector CT imaging of the abdomen and pelvis was performed using the standard protocol following bolus administration of intravenous contrast. CONTRAST:  171mL ISOVUE-300 IOPAMIDOL (ISOVUE-300) INJECTION 61% COMPARISON:  01/06/2015 FINDINGS: Lower chest: There is mild subsegmental pleural-based atelectasis or scarring at the bases, right greater than left. No suspicious pulmonary nodules or pleural effusions. Trace pericardial effusion noted. Hepatobiliary: No focal abnormality identified within the liver. Gallbladder is surgically absent. Pancreas: No focal abnormality identified within the pancreas. Spleen: Spleen is homogeneous and normal in appearance. Adrenals/Urinary Tract: Adrenal glands are normal in appearance. There is no hydronephrosis. Upper pole left renal cyst is present. Stomach/Bowel: Moderate hiatal hernia is present. The stomach and small bowel loops are normal in appearance. The appendix is well seen and has a normal appearance. There are significant colonic diverticula. There is thickening of the peritoneal reflection adjacent to the left pericolic gutter and mild inflammatory  change surrounding the descending colon, consistent with acute diverticulitis. No associated perforation or abscess present. Vascular/Lymphatic: There is extensive atherosclerotic calcification of the abdominal aorta. The no aneurysm. Reproductive: Status post hysterectomy. No adnexal mass. No free pelvic fluid. Other: Within the left ischiorectal space, there is a 1.7 x 1.2 centimeter residual collections following presumed previous hematoma which previously measured greater than 7 centimeters. No new or suspicious mass identified in this region. Musculoskeletal: Degenerative changes are identified in the lower thoracic and lumbar spine. No suspicious lytic or blastic lesions are identified. IMPRESSION: 1. Colonic diverticulosis and sigmoid diverticulitis not associated with abscess or perforation. 2. Significant improvement and left perirectal hematoma since prior study, now measuring 1.7 x 1.2 centimeters. 3. Trace pericardial effusion. 4. Status post cholecystectomy. 5. Left renal cyst. 6. Moderate hiatal hernia. Electronically Signed   By: Nolon Nations M.D.   On: 11/20/2015 10:47   I have personally reviewed and evaluated these images and lab results as part of my medical decision-making.  Chart review notable for hemorrhoid surgery one year ago.  11:04 AM Patient continues to have bright red blood per rectum, but vital signs remain similar, unremarkable. We discussed all findings, including CT results.   I discussed patient's case with GI, who will consult.  MDM  Elderly femaleWith a history of diverticulitis presents with left lower quadrant abdominal pain, rectal bleeding. Here she is awake, alert, hemodynamically stable, but does have several episodes of bright red blood per rectum. Patient's evaluation consistent with diverticulitis, diverticulosis, and with ongoing bleeding I discussed her case with her gastroenterology team. Patient admitted for further evaluation, management after  initiation of antibiotics.   Carmin Muskrat, MD 11/20/15 [phone]

## 2015-11-21 ENCOUNTER — Telehealth: Payer: Self-pay

## 2015-11-21 DIAGNOSIS — E869 Volume depletion, unspecified: Secondary | ICD-10-CM | POA: Insufficient documentation

## 2015-11-21 LAB — CBC
HCT: 31 % — ABNORMAL LOW (ref 36.0–46.0)
Hemoglobin: 10.5 g/dL — ABNORMAL LOW (ref 12.0–15.0)
MCH: 30.7 pg (ref 26.0–34.0)
MCHC: 33.9 g/dL (ref 30.0–36.0)
MCV: 90.6 fL (ref 78.0–100.0)
Platelets: 154 10*3/uL (ref 150–400)
RBC: 3.42 MIL/uL — ABNORMAL LOW (ref 3.87–5.11)
RDW: 12.8 % (ref 11.5–15.5)
WBC: 6.1 10*3/uL (ref 4.0–10.5)

## 2015-11-21 LAB — BASIC METABOLIC PANEL
Anion gap: 5 (ref 5–15)
BUN: 11 mg/dL (ref 6–20)
CO2: 27 mmol/L (ref 22–32)
Calcium: 8.3 mg/dL — ABNORMAL LOW (ref 8.9–10.3)
Chloride: 106 mmol/L (ref 101–111)
Creatinine, Ser: 0.78 mg/dL (ref 0.44–1.00)
GFR calc Af Amer: >60 mL/min (ref >60)
GFR calc non Af Amer: >60 mL/min (ref >60)
Glucose, Bld: 113 mg/dL — ABNORMAL HIGH (ref 65–99)
Potassium: 3.9 mmol/L (ref 3.5–5.1)
Sodium: 138 mmol/L (ref 135–145)

## 2015-11-21 MED ORDER — HALOPERIDOL LACTATE 5 MG/ML IJ SOLN
1.0000 mg | INTRAMUSCULAR | Status: DC | PRN
  Administered 2015-11-21 – 2015-11-22 (×2): 1 mg via INTRAVENOUS
  Filled 2015-11-21: qty 1

## 2015-11-21 MED ORDER — HALOPERIDOL LACTATE 5 MG/ML IJ SOLN
0.5000 mg | Freq: Four times a day (QID) | INTRAMUSCULAR | Status: DC | PRN
  Administered 2015-11-21: 0.5 mg via INTRAVENOUS

## 2015-11-21 MED ORDER — LORAZEPAM 0.5 MG PO TABS
0.5000 mg | ORAL_TABLET | Freq: Once | ORAL | Status: AC
  Administered 2015-11-21: 0.5 mg via ORAL
  Filled 2015-11-21: qty 1

## 2015-11-21 MED ORDER — HALOPERIDOL LACTATE 5 MG/ML IJ SOLN
INTRAMUSCULAR | Status: AC
  Filled 2015-11-21: qty 1

## 2015-11-21 MED ORDER — LORAZEPAM 0.5 MG PO TABS
0.5000 mg | ORAL_TABLET | Freq: Four times a day (QID) | ORAL | Status: DC | PRN
  Administered 2015-11-21 – 2015-11-23 (×3): 0.5 mg via ORAL
  Filled 2015-11-21 (×3): qty 1

## 2015-11-21 MED ORDER — TRAZODONE HCL 50 MG PO TABS
50.0000 mg | ORAL_TABLET | Freq: Every day | ORAL | Status: DC
  Administered 2015-11-21 – 2015-11-22 (×2): 50 mg via ORAL
  Filled 2015-11-21 (×3): qty 1

## 2015-11-21 NOTE — Progress Notes (Signed)
Pt remains in soft wrist and ankle restraints. Pt continuously attempting to get out of restraints. Restraints removed and range of motion performed. Mild redness noted. Pt offered fluids and food. Pt declined toileting. Pt personal caregiver remains at bedside, does not follow any commands. Will continue to monitor. Kizzie Ide, RN

## 2015-11-21 NOTE — Evaluation (Signed)
Physical Therapy Evaluation Patient Details Name: Kimberly Acevedo MRN: [id number] DOB: 1929/01/21 Today's Date: 11/21/2015   History of Present Illness  Pt admitted from home with rectal bleed and hx of dementia and fibromyalgia.  Pt from home with 24/7 caregivers  Clinical Impression  Pt admitted as above and presenting with functional mobility limitations 2* generalized weakness, ambulatory balance deficits and dementia related cognitive deficits.  Pt should progress to dc home with 24/7 caregivers.    Follow Up Recommendations No PT follow up    Equipment Recommendations  None recommended by PT    Recommendations for Other Services       Precautions / Restrictions Precautions Precautions: Fall Restrictions Weight Bearing Restrictions: No      Mobility  Bed Mobility Overal bed mobility: Needs Assistance Bed Mobility: Supine to Sit     Supine to sit: Min assist     General bed mobility comments: min assist and cues to initiate movement  Transfers Overall transfer level: Needs assistance Equipment used: 1 person hand held assist Transfers: Sit to/from Stand Sit to Stand: Min assist         General transfer comment: min assist to initiate movement and steady in initial standing  Ambulation/Gait Ambulation/Gait assistance: Min assist;Min guard Ambulation Distance (Feet): 450 Feet Assistive device: 1 person hand held assist;None Gait Pattern/deviations: Step-through pattern;Decreased step length - right;Decreased step length - left;Shuffle;Trunk flexed;Wide base of support Gait velocity: decr Gait velocity interpretation: Below normal speed for age/gender General Gait Details: initial HHA for stability but progressed to min guard with increased distance ambulated  Stairs            Wheelchair Mobility    Modified Rankin (Stroke Patients Only)       Balance Overall balance assessment: Needs assistance Sitting-balance support: No upper extremity  supported;Feet supported Sitting balance-Leahy Scale: Good     Standing balance support: No upper extremity supported Standing balance-Leahy Scale: Fair                               Pertinent Vitals/Pain Pain Assessment: No/denies pain    Home Living Family/patient expects to be discharged to:: Private residence Living Arrangements: Other (Comment) (24/7 caregivers) Available Help at Discharge: Available 24 hours/day Type of Home: House Home Access: Stairs to enter Entrance Stairs-Rails: Right Entrance Stairs-Number of Steps: 6 Home Layout: Two level;Able to live on main level with bedroom/bathroom Home Equipment: [name] - single point Additional Comments: caregiver present for first part of session and providing information    Prior Function Level of Independence: Needs assistance   Gait / Transfers Assistance Needed: caregiver states min assist and cues to get pt to initiate movement  ADL's / Homemaking Assistance Needed: caregivers  Comments: occasional use of cane     Hand Dominance        Extremity/Trunk Assessment   Upper Extremity Assessment: Generalized weakness           Lower Extremity Assessment: Generalized weakness         Communication   Communication: No difficulties  Cognition Arousal/Alertness: Awake/alert Behavior During Therapy: WFL for tasks assessed/performed Overall Cognitive Status: History of cognitive impairments - at baseline       Memory: Decreased short-term memory              General Comments      Exercises        Assessment/Plan    PT Assessment Patient  needs continued PT services  PT Diagnosis Difficulty walking   PT Problem List Decreased strength;Decreased activity tolerance;Decreased balance;Decreased mobility;Decreased cognition;Decreased knowledge of use of DME;Obesity  PT Treatment Interventions DME instruction;Gait training;Stair training;Functional mobility training;Therapeutic  activities;Therapeutic exercise;Patient/family education;Cognitive remediation   PT Goals (Current goals can be found in the Care Plan section) Acute Rehab PT Goals Patient Stated Goal: Get stronger - " I feel weak, my legs feel weak" PT Goal Formulation: With patient Time For Goal Achievement: 12/05/15 Potential to Achieve Goals: Good    Frequency Min 3X/week   Barriers to discharge        Co-evaluation               End of Session Equipment Utilized During Treatment: Gait belt Activity Tolerance: Patient tolerated treatment well Patient left: in chair;with call bell/phone within reach;with chair alarm set Nurse Communication: Mobility status         Time: 0935-1001 PT Time Calculation (min) (ACUTE ONLY): 26 min   Charges:   PT Evaluation $PT Eval Low Complexity: 1 Procedure PT Treatments $Gait Training: 8-22 mins   PT G Codes:        Kimberly Acevedo 12/10/2015, 11:28 AM

## 2015-11-21 NOTE — Progress Notes (Signed)
Pt very agitated, getting up out of bed continuously w/ home caregiver in room.  Non redirectable from floor staff. MD paged for PRN intervention.  Orders received. Will continue to monitor. Kizzie Ide, RN

## 2015-11-21 NOTE — Progress Notes (Signed)
Pt caregiver stated the pt was given 0.5mg  ativan (home supply) at 0700. Pt caregiver informed to take home medications back home. Caregiver agreed and left w/ pt home meds. Pt alert, disoriented at baseline. Will continue to monitor. Kizzie Ide, RN

## 2015-11-21 NOTE — Telephone Encounter (Signed)
-----   Message from Manus Gunning, MD sent at 11/21/2015  3:07 PM EDT ----- This patient is likely going to be discharged over the weekend. She should follow up with Dr. Carlean Purl in the next 1-2 months for reassessment, he is her primary GI. Thanks

## 2015-11-21 NOTE — Progress Notes (Signed)
Pt remains agitated, combative and verbally aggressive towards staff. Pt continuously attempting to exit bed, non redirectable. MD paged. Non violent restraints ordered. Pt placed in non violent restraints. Will continue to monitor. Kizzie Ide, RN

## 2015-11-21 NOTE — Progress Notes (Signed)
PROGRESS NOTE    SHAVELLE TEP  [id number] DOB: 12-Jul-1928 DOA: 11/20/2015 PCP: [name], MD  Outpatient Specialists:   Brief Narrative: 82 year old female with history of diverticulosis, diverticulitis, dementia and hyperlipidemia. Patient is not able to give any history due to dementing illness. Patient was seen alongside patient's care giver. The history has come from the caregiver who also was given second hand information. Patient presents with bright red blood per rectum that started this morning. No associated abdominal pain, no syncope or dizziness, no other associated constitutional symptoms. Vitals are stable. Hemoglobin is 14.3g/dl. CT scan done on admission revealed colonic diverticulosis and sigmoid diverticulitis. ER Physician has already consulted the GI Team.  Assessment & Plan:   Active Problems:   GI bleed   Gastrointestinal hemorrhage associated with intestinal diverticulitis   Diverticulitis of colon  Assessment/Plan  Rectal bleed, likely diverticular bleed  Sigmoid diverticulitis  Volume depletion  Dementia   GI input is appreciated.  Continue IV antibiotics  Continue to monitor H/H.          Oral intake as per GI.   Further management will depend on hospital course  DVT prophylaxis: SCD Code Status: Full until reviewed further Family Communication:  Disposition Plan: Home eventually  Consults called: GI already consulted by ER Physician  Admission status: Inpatient   Subjective: No further bleeding overnight. Drop in H/H noted. Patient can be resistive to management  Objective: Filed Vitals:   11/20/15 1425 11/20/15 2002 11/21/15 0527 11/21/15 1417  BP: 126/95 117/78 139/46 140/65  Pulse: 80 71 72 78  Temp: 98.2 F (36.8 C) 98.5 F (36.9 C) 98 F (36.7 C) 97.7 F (36.5 C)  TempSrc: Oral Oral Oral Oral  Resp: 18 20 20 18   Height:      SpO2: 100% 97% 97% 94%    Intake/Output Summary (Last 24 hours) at 11/21/15  1508 Last data filed at 11/20/15 1842  Gross per 24 hour  Intake  632.5 ml  Output    450 ml  Net  182.5 ml   There were no vitals filed for this visit.  Examination:  Constitutional:   Appears calm and comfortable Eyes:   Mild pallor. No jaundice.  ENMT:   Dry buccal Mucosa. Neck:   Neck is supple. No JVD Respiratory:   CTA bilaterally, no w/r/r.   Respiratory effort normal. No retractions or accessory muscle use Cardiovascular:   S1S2  No LE extremity edema Abdomen:   Abdomen is soft and non tender. Organs are difficult to assess. Neurologic:   Awake and alert.  Moves all limbs.    Data Reviewed: I have personally reviewed following labs and imaging studies  CBC:  Recent Labs Lab 11/20/15 0800 11/20/15 1207 11/21/15 0458  WBC 8.6  --  6.1  HGB 14.3 12.5 10.5*  HCT 42.1 36.9 31.0*  MCV 92.5  --  90.6  PLT 198  --  123456   Basic Metabolic Panel:  Recent Labs Lab 11/20/15 0800 11/20/15 1207 11/21/15 0458  NA 137  --  138  K 4.6  --  3.9  CL 106  --  106  CO2 22  --  27  GLUCOSE 147*  --  113*  BUN 16  --  11  CREATININE 0.94  --  0.78  CALCIUM 8.8*  --  8.3*  MG  --  1.8  --   PHOS  --  4.2  --    GFR: CrCl cannot be calculated (  Unknown ideal weight.). Liver Function Tests:  Recent Labs Lab 11/20/15 0800  AST 36  ALT 13*  ALKPHOS 84  BILITOT 1.1  PROT 6.7  ALBUMIN 3.7   No results for input(s): LIPASE, AMYLASE in the last 168 hours. No results for input(s): AMMONIA in the last 168 hours. Coagulation Profile: No results for input(s): INR, PROTIME in the last 168 hours. Cardiac Enzymes: No results for input(s): CKTOTAL, CKMB, CKMBINDEX, TROPONINI in the last 168 hours. BNP (last 3 results) No results for input(s): PROBNP in the last 8760 hours. HbA1C: No results for input(s): HGBA1C in the last 72 hours. CBG: No results for input(s): GLUCAP in the last 168 hours. Lipid Profile: No results for input(s): CHOL,  HDL, LDLCALC, TRIG, CHOLHDL, LDLDIRECT in the last 72 hours. Thyroid Function Tests: No results for input(s): TSH, T4TOTAL, FREET4, T3FREE, THYROIDAB in the last 72 hours. Anemia Panel: No results for input(s): VITAMINB12, FOLATE, FERRITIN, TIBC, IRON, RETICCTPCT in the last 72 hours. Urine analysis:    Component Value Date/Time   COLORURINE YELLOW 01/11/2015 1030   APPEARANCEUR TURBID* 01/11/2015 1030   LABSPEC 1.016 01/11/2015 1030   PHURINE 8.0 01/11/2015 1030   GLUCOSEU NEGATIVE 01/11/2015 1030   HGBUR LARGE* 01/11/2015 1030   BILIRUBINUR NEGATIVE 01/11/2015 1030   KETONESUR NEGATIVE 01/11/2015 1030   PROTEINUR 100* 01/11/2015 1030   UROBILINOGEN 1.0 01/11/2015 1030   NITRITE NEGATIVE 01/11/2015 1030   LEUKOCYTESUR LARGE* 01/11/2015 1030   Sepsis Labs: @LABRCNTIP (procalcitonin:4,lacticidven:4)  )No results found for this or any previous visit (from the past 240 hour(s)).       Radiology Studies: Ct Abdomen Pelvis W Contrast  11/20/2015  CLINICAL DATA:  Per [facility] pt from home, pt. C/o bright red rectal bleeding with clots Per care giver, started 1 hour ago. Pt reports weakness and near syncope, Hx of dementia, IBS, hemorrhoids, anal skin tags, and diverticulitis EXAM: CT ABDOMEN AND PELVIS WITH CONTRAST TECHNIQUE: Multidetector CT imaging of the abdomen and pelvis was performed using the standard protocol following bolus administration of intravenous contrast. CONTRAST:  181mL ISOVUE-300 IOPAMIDOL (ISOVUE-300) INJECTION 61% COMPARISON:  01/06/2015 FINDINGS: Lower chest: There is mild subsegmental pleural-based atelectasis or scarring at the bases, right greater than left. No suspicious pulmonary nodules or pleural effusions. Trace pericardial effusion noted. Hepatobiliary: No focal abnormality identified within the liver. Gallbladder is surgically absent. Pancreas: No focal abnormality identified within the pancreas. Spleen: Spleen is homogeneous and normal in appearance.  Adrenals/Urinary Tract: Adrenal glands are normal in appearance. There is no hydronephrosis. Upper pole left renal cyst is present. Stomach/Bowel: Moderate hiatal hernia is present. The stomach and small bowel loops are normal in appearance. The appendix is well seen and has a normal appearance. There are significant colonic diverticula. There is thickening of the peritoneal reflection adjacent to the left pericolic gutter and mild inflammatory change surrounding the descending colon, consistent with acute diverticulitis. No associated perforation or abscess present. Vascular/Lymphatic: There is extensive atherosclerotic calcification of the abdominal aorta. The no aneurysm. Reproductive: Status post hysterectomy. No adnexal mass. No free pelvic fluid. Other: Within the left ischiorectal space, there is a 1.7 x 1.2 centimeter residual collections following presumed previous hematoma which previously measured greater than 7 centimeters. No new or suspicious mass identified in this region. Musculoskeletal: Degenerative changes are identified in the lower thoracic and lumbar spine. No suspicious lytic or blastic lesions are identified. IMPRESSION: 1. Colonic diverticulosis and sigmoid diverticulitis not associated with abscess or perforation. 2. Significant improvement and left perirectal  hematoma since prior study, now measuring 1.7 x 1.2 centimeters. 3. Trace pericardial effusion. 4. Status post cholecystectomy. 5. Left renal cyst. 6. Moderate hiatal hernia. Electronically Signed   By: Nolon Nations M.D.   On: 11/20/2015 10:47        Scheduled Meds: . cholecalciferol  2,000 Units Oral Daily  . ciprofloxacin  400 mg Intravenous Q12H  . losartan  50 mg Oral Daily  . metronidazole  500 mg Intravenous Q8H  . sertraline  100 mg Oral q morning - 10a  . traZODone  50 mg Oral QHS  . vitamin B-12  1,000 mcg Oral Daily   Continuous Infusions: . sodium chloride 50 mL/hr at 11/21/15 1000     LOS: 1 day     Time spent: 25 Minutes.    Dana Allan, MD  [facility] Pager #: [phone] 7PM-7AM contact night coverage as above

## 2015-11-21 NOTE — Progress Notes (Signed)
Progress Note   Subjective  Kimberly Acevedo is an 82 y/o Caucasian female who was admitted to the hospital on 11/20/15 with diverticulitis. This morning, the patient is found sitting in bed, per her caregiver who is by her side, she did very well overnight and has already started to improve. She has not had a bloody bowel movement since 2pm yesterday afternoon. This was confirmed by nursing.   Patient denies new complaints or concerns.  Per hospitalist, pt was resisting IV placement this morning, but at time of my interview one is in place.   Objective   Vital signs in last 24 hours: Temp:  [97.8 F (36.6 C)-98.5 F (36.9 C)] 98 F (36.7 C) (06/16 0527) Pulse Rate:  [71-80] 72 (06/16 0527) Resp:  [16-20] 20 (06/16 0527) BP: (117-162)/(46-104) 139/46 mmHg (06/16 0527) SpO2:  [97 %-100 %] 97 % (06/16 0527) Last BM Date: 11/20/15 General:Caucasian female in NAD Heart:  Regular rate and rhythm; no murmurs Lungs: Respirations even and unlabored, lungs CTA bilaterally Abdomen:  Soft, Moderate ttp in LLQ with guarding and nondistended. Normal bowel sounds. Extremities:  Without edema. Neurologic:  Alert and oriented,  grossly normal neurologically. Dementia Psych:  Cooperative. Normal mood and affect.  Intake/Output from previous day: 06/15 0701 - 06/16 0700 In: 872.5 [P.O.:480; I.V.:292.5; IV Piggyback:100] Out: 450 [Urine:450]  Lab Results:  Recent Labs  11/20/15 0800 11/20/15 1207 11/21/15 0458  WBC 8.6  --  6.1  HGB 14.3 12.5 10.5*  HCT 42.1 36.9 31.0*  PLT 198  --  154   BMET  Recent Labs  11/20/15 0800 11/21/15 0458  NA 137 138  K 4.6 3.9  CL 106 106  CO2 22 27  GLUCOSE 147* 113*  BUN 16 11  CREATININE 0.94 0.78  CALCIUM 8.8* 8.3*   LFT  Recent Labs  11/20/15 0800  PROT 6.7  ALBUMIN 3.7  AST 36  ALT 13*  ALKPHOS 84  BILITOT 1.1   PT/INR No results for input(s): LABPROT, INR in the last 72 hours.  Studies/Results: Ct Abdomen Pelvis W  Contrast  11/20/2015  CLINICAL DATA:  Per [facility] pt from home, pt. C/o bright red rectal bleeding with clots Per care giver, started 1 hour ago. Pt reports weakness and near syncope, Hx of dementia, IBS, hemorrhoids, anal skin tags, and diverticulitis EXAM: CT ABDOMEN AND PELVIS WITH CONTRAST TECHNIQUE: Multidetector CT imaging of the abdomen and pelvis was performed using the standard protocol following bolus administration of intravenous contrast. CONTRAST:  165mL ISOVUE-300 IOPAMIDOL (ISOVUE-300) INJECTION 61% COMPARISON:  01/06/2015 FINDINGS: Lower chest: There is mild subsegmental pleural-based atelectasis or scarring at the bases, right greater than left. No suspicious pulmonary nodules or pleural effusions. Trace pericardial effusion noted. Hepatobiliary: No focal abnormality identified within the liver. Gallbladder is surgically absent. Pancreas: No focal abnormality identified within the pancreas. Spleen: Spleen is homogeneous and normal in appearance. Adrenals/Urinary Tract: Adrenal glands are normal in appearance. There is no hydronephrosis. Upper pole left renal cyst is present. Stomach/Bowel: Moderate hiatal hernia is present. The stomach and small bowel loops are normal in appearance. The appendix is well seen and has a normal appearance. There are significant colonic diverticula. There is thickening of the peritoneal reflection adjacent to the left pericolic gutter and mild inflammatory change surrounding the descending colon, consistent with acute diverticulitis. No associated perforation or abscess present. Vascular/Lymphatic: There is extensive atherosclerotic calcification of the abdominal aorta. The no aneurysm. Reproductive: Status post hysterectomy. No adnexal mass. No  free pelvic fluid. Other: Within the left ischiorectal space, there is a 1.7 x 1.2 centimeter residual collections following presumed previous hematoma which previously measured greater than 7 centimeters. No new or suspicious  mass identified in this region. Musculoskeletal: Degenerative changes are identified in the lower thoracic and lumbar spine. No suspicious lytic or blastic lesions are identified. IMPRESSION: 1. Colonic diverticulosis and sigmoid diverticulitis not associated with abscess or perforation. 2. Significant improvement and left perirectal hematoma since prior study, now measuring 1.7 x 1.2 centimeters. 3. Trace pericardial effusion. 4. Status post cholecystectomy. 5. Left renal cyst. 6. Moderate hiatal hernia. Electronically Signed   By: Nolon Nations M.D.   On: 11/20/2015 10:47       Assessment / Plan:   Impression: 1. Hematochezia: Started with hematochezia and LLQ pain on 11/20/15, day of admission-consistent with diverticulitis seen on recent CT-pt continued with hematochezia yesterday with last stool around 2pm on 11/20/15, none since then. Hgb did drop from 14.3 to 10.5 this morning, would suspect this will stabilize since pt is no longer experiencing hematochezia 2. Sigmoid diverticulitis: confirmed on CT abd/pelvis 3. LLQ abdominal pain: see above   Plan: 1. Continue IV Cipro and Flagyl 2. Continue supportive measures 3. Will advance pt to full liquid diet today, can start low fiber low residue tomorrow if continues to do well 4. Will discuss above with Dr. Havery Moros, please await any further recs  Thank you for your kind consultation, we will continue to follow.  Active Problems:   GI bleed   Gastrointestinal hemorrhage associated with intestinal diverticulitis   Diverticulitis of colon     LOS: 1 day   Levin Erp  11/21/2015, 8:45 AM  Pager # [phone]

## 2015-11-21 NOTE — Progress Notes (Signed)
Pt. Unable to sleep due to lights on the bed and movement of bed. Pt. Was given benadryl earlier in evening for sleep. Pt and her 24hr caregiver requested to have bed unplugged in order for the pt. To go to sleep. Pt. And caregiver instructed to call for assistance when exiting the bed.

## 2015-11-21 NOTE — Telephone Encounter (Signed)
Pt scheduled to see Dr. Carlean Purl 02/02/16@10 :45am. Pt aware of appt.

## 2015-11-22 MED ORDER — MENTHOL 3 MG MT LOZG
1.0000 | LOZENGE | OROMUCOSAL | Status: DC | PRN
Start: 2015-11-22 — End: 2015-11-23
  Filled 2015-11-22 (×2): qty 9

## 2015-11-22 MED ORDER — QUETIAPINE FUMARATE 25 MG PO TABS
25.0000 mg | ORAL_TABLET | Freq: Every day | ORAL | Status: DC
  Administered 2015-11-22: 25 mg via ORAL
  Filled 2015-11-22 (×2): qty 1

## 2015-11-22 NOTE — Progress Notes (Signed)
Physical Therapy Treatment Patient Details Name: Kimberly Acevedo MRN: [id number] DOB: January 10, 1929 Today's Date: 11/22/2015    History of Present Illness Pt admitted from home with rectal bleed and hx of dementia and fibromyalgia.  Pt from home with 24/7 caregivers    PT Comments    Pt requiring more assist today overall, only able to amb ~55', too fatigued to go further;  pt has been in restraints since yesterday although she was quite calm after PT session; RN made aware  pt personal caregiver present and restraints not placed  Follow Up Recommendations  No PT follow up;Supervision/Assistance - 24 hour     Equipment Recommendations  None recommended by PT    Recommendations for Other Services       Precautions / Restrictions Precautions Precautions: Fall Precaution Comments: pt in 4point restraints on PT arrival with personal care aide present    Mobility  Bed Mobility Overal bed mobility: Needs Assistance Bed Mobility: Supine to Sit     Supine to sit: Mod assist     General bed mobility comments: assist with trunk, incr time, assist with LEs onto bed, multi-modal cues   Transfers Overall transfer level: Needs assistance Equipment used: 1 person hand held assist Transfers: Sit to/from Stand Sit to Stand: Min assist         General transfer comment: min assist to initiate movement and steady in initial standing; cues for safety and hand placement  for toilet transfer  Ambulation/Gait Ambulation/Gait assistance: Mod assist Ambulation Distance (Feet): 55 Feet (12') Assistive device: 1 person hand held assist (IV pole at times) Gait Pattern/deviations: Step-through pattern;Decreased stride length;Trunk flexed;Shuffle;Wide base of support Gait velocity: decr   General Gait Details: pt requiring incr assist today, posterior lean during gait; multi-modal cues for safety, direction, avoiding obstacles in hallway; pt fatigues more quickly today   Stairs            Wheelchair Mobility    Modified Rankin (Stroke Patients Only)       Balance     Sitting balance-Leahy Scale: Fair     Standing balance support: Single extremity supported;During functional activity Standing balance-Leahy Scale: Poor                      Cognition Arousal/Alertness: Awake/alert Behavior During Therapy: Restless (mildly) Overall Cognitive Status: History of cognitive impairments - at baseline       Memory: Decreased short-term memory              Exercises      General Comments        Pertinent Vitals/Pain Pain Assessment: No/denies pain    Home Living                      Prior Function            PT Goals (current goals can now be found in the care plan section) Acute Rehab PT Goals Patient Stated Goal: Get stronger - " I feel weak, my legs feel weak" Time For Goal Achievement: 12/05/15 Potential to Achieve Goals: Good Progress towards PT goals: Progressing toward goals    Frequency  Min 3X/week    PT Plan Current plan remains appropriate    Co-evaluation             End of Session Equipment Utilized During Treatment: Gait belt Activity Tolerance: Patient tolerated treatment well;Patient limited by fatigue Patient left: in bed;with call bell/phone within reach;with bed  alarm set;with family/visitor present (personal sitter present)     Time: [id number] PT Time Calculation (min) (ACUTE ONLY): 28 min  Charges:  $Gait Training: 23-37 mins                    G Codes:      Kimberly Acevedo 12-19-15, 1:43 PM

## 2015-11-22 NOTE — Progress Notes (Signed)
CROSS COVER [facility] Subjective: Patient admitted for a GI bleed. Bleeding seems to have resolved. She denies having any nausea, vomiting or abdominal pain. Hb stable at 10.5  Objective: Vital signs in last 24 hours: Temp:  [97.7 F (36.5 C)-98.7 F (37.1 C)] 97.9 F (36.6 C) (06/17 0519) Pulse Rate:  [78-115] 99 (06/17 0519) Resp:  [18-20] 20 (06/17 0519) BP: (140-186)/(65-94) 141/69 mmHg (06/17 0519) SpO2:  [94 %-100 %] 100 % (06/17 0519) Last BM Date: 11/20/15  Intake/Output from previous day: 06/16 0701 - 06/17 0700 In: 360 [P.O.:360] Out: -  Intake/Output this shift:    General appearance: alert Resp: clear to auscultation bilaterally Cardio: regular rate and rhythm, S1, S2 normal, no murmur, click, rub or gallop GI: soft, non-tender; bowel sounds normal; no masses,  no organomegaly  Lab Results:  Recent Labs  11/20/15 0800 11/20/15 1207 11/21/15 0458  WBC 8.6  --  6.1  HGB 14.3 12.5 10.5*  HCT 42.1 36.9 31.0*  PLT 198  --  154   BMET  Recent Labs  11/20/15 0800 11/21/15 0458  NA 137 138  K 4.6 3.9  CL 106 106  CO2 22 27  GLUCOSE 147* 113*  BUN 16 11  CREATININE 0.94 0.78  CALCIUM 8.8* 8.3*   LFT  Recent Labs  11/20/15 0800  PROT 6.7  ALBUMIN 3.7  AST 36  ALT 13*  ALKPHOS 84  BILITOT 1.1   Studies/Results: Ct Abdomen Pelvis W Contrast  11/20/2015  CLINICAL DATA:  Per [facility] pt from home, pt. C/o bright red rectal bleeding with clots Per care giver, started 1 hour ago. Pt reports weakness and near syncope, Hx of dementia, IBS, hemorrhoids, anal skin tags, and diverticulitis EXAM: CT ABDOMEN AND PELVIS WITH CONTRAST TECHNIQUE: Multidetector CT imaging of the abdomen and pelvis was performed using the standard protocol following bolus administration of intravenous contrast. CONTRAST:  176mL ISOVUE-300 IOPAMIDOL (ISOVUE-300) INJECTION 61% COMPARISON:  01/06/2015 FINDINGS: Lower chest: There is mild subsegmental pleural-based atelectasis or scarring at  the bases, right greater than left. No suspicious pulmonary nodules or pleural effusions. Trace pericardial effusion noted. Hepatobiliary: No focal abnormality identified within the liver. Gallbladder is surgically absent. Pancreas: No focal abnormality identified within the pancreas. Spleen: Spleen is homogeneous and normal in appearance. Adrenals/Urinary Tract: Adrenal glands are normal in appearance. There is no hydronephrosis. Upper pole left renal cyst is present. Stomach/Bowel: Moderate hiatal hernia is present. The stomach and small bowel loops are normal in appearance. The appendix is well seen and has a normal appearance. There are significant colonic diverticula. There is thickening of the peritoneal reflection adjacent to the left pericolic gutter and mild inflammatory change surrounding the descending colon, consistent with acute diverticulitis. No associated perforation or abscess present. Vascular/Lymphatic: There is extensive atherosclerotic calcification of the abdominal aorta. The no aneurysm. Reproductive: Status post hysterectomy. No adnexal mass. No free pelvic fluid. Other: Within the left ischiorectal space, there is a 1.7 x 1.2 centimeter residual collections following presumed previous hematoma which previously measured greater than 7 centimeters. No new or suspicious mass identified in this region. Musculoskeletal: Degenerative changes are identified in the lower thoracic and lumbar spine. No suspicious lytic or blastic lesions are identified. IMPRESSION: 1. Colonic diverticulosis and sigmoid diverticulitis not associated with abscess or perforation. 2. Significant improvement and left perirectal hematoma since prior study, now measuring 1.7 x 1.2 centimeters. 3. Trace pericardial effusion. 4. Status post cholecystectomy. 5. Left renal cyst. 6. Moderate hiatal hernia. Electronically Signed  By: Nolon Nations M.D.   On: 11/20/2015 10:47   Medications: I have reviewed the patient's  current medications.  Assessment/Plan: Diverticular bleed seems to have resolved. Continue present care.   LOS: 2 days   Estell Puccini 11/22/2015, 9:29 AM

## 2015-11-22 NOTE — Progress Notes (Signed)
PROGRESS NOTE    Kimberly Acevedo  [id number] DOB: [birth year] DOA: 11/20/2015 PCP: [name], MD  Outpatient Specialists:   Brief Narrative: 82 year old female with history of diverticulosis, diverticulitis, dementia and hyperlipidemia. Patient is not able to give any history due to dementing illness. Patient was seen alongside patient's care giver. The history has come from the caregiver who also was given second hand information. Patient presents with bright red blood per rectum that started this morning. No associated abdominal pain, no syncope or dizziness, no other associated constitutional symptoms. Vitals are stable. Hemoglobin is 14.3g/dl. CT scan done on admission revealed colonic diverticulosis and sigmoid diverticulitis.   Assessment & Plan:   Active Problems:   GI bleed   Gastrointestinal hemorrhage associated with intestinal diverticulitis   Diverticulitis of colon   Volume depletion  Assessment/Plan  1.  Probable lower GI bleed. -Kimberly Acevedo is an 82 year old female admitted to the medicine service on 11/20/2015 when she presented with hematochezia. -Initial lab work revealed stable hemoglobin of 14.3. Her hemoglobin has since dropped to 10.5. -CT scan of abdomen and pelvis with IV contrast performed on 11/20/2015 showing colonic diverticulosis and sigmoid diverticulitis without evidence of abscess or perforation. -GI was consulted as she was evaluated by Dr Havery Moros  -Bleed felt to be diverticular in origin. -Continue supportive care  2.  Acute diverticulitis -She originally presented with left lower quadrant abdominal pain and hematochezia. -CT scan of abdomen and pelvis performed on admission showing evidence of sigmoid diverticulitis. -Continue ciprofloxacin 400 mg IV twice a day and Flagyl 500 mg IV 3 times a day -She remains afebrile hemodynamically stable  3.  History of dementia -She currently resides in the community -Overnight she became  agitated and was given antipsychotics -Recommended to nursing staff to remove wrist restraints -Will attempt to mobilize. -Provide Seroquel 25 mg by mouth daily at bedtime and stop IV Haldol  4.  Hypertension -Blood pressures are stable -Continue losartan 50 mg by mouth daily  DVT prophylaxis: SCD's Code Status: Full code Family Communication: I spoke to her daughter Disposition Plan: Possible discharge home with home health services Consults called: GI Admission status: Inpatient   Subjective:  Nursing staff reporting she became agitated overnight and given antipsychotics. She is sedated however arousable  Objective: Filed Vitals:   11/21/15 0527 11/21/15 1417 11/21/15 2100 11/22/15 0519  BP: 139/46 140/65 186/94 141/69  Pulse: 72 78 115 99  Temp: 98 F (36.7 C) 97.7 F (36.5 C) 98.7 F (37.1 C) 97.9 F (36.6 C)  TempSrc: Oral Oral Oral Oral  Resp: 20 18 18 20   Height:      SpO2: 97% 94% 95% 100%   No intake or output data in the 24 hours ending 11/22/15 1357 There were no vitals filed for this visit.  Examination:  Constitutional:   Appears calm and comfortable Eyes:   Mild pallor. No jaundice.  ENMT:   Dry buccal Mucosa. Neck:   Neck is supple. No JVD Respiratory:   CTA bilaterally, no w/r/r.   Respiratory effort normal. No retractions or accessory muscle use Cardiovascular:   S1S2  No LE extremity edema Abdomen:   Abdomen is soft and non tender. Organs are difficult to assess. Neurologic:   Awake and alert.  Moves all limbs.    Data Reviewed: I have personally reviewed following labs and imaging studies  CBC:  Recent Labs Lab 11/20/15 0800 11/20/15 1207 11/21/15 0458  WBC 8.6  --  6.1  HGB 14.3  12.5 10.5*  HCT 42.1 36.9 31.0*  MCV 92.5  --  90.6  PLT 198  --  123456   Basic Metabolic Panel:  Recent Labs Lab 11/20/15 0800 11/20/15 1207 11/21/15 0458  NA 137  --  138  K 4.6  --  3.9  CL 106  --  106  CO2 22  --   27  GLUCOSE 147*  --  113*  BUN 16  --  11  CREATININE 0.94  --  0.78  CALCIUM 8.8*  --  8.3*  MG  --  1.8  --   PHOS  --  4.2  --    GFR: CrCl cannot be calculated (Unknown ideal weight.). Liver Function Tests:  Recent Labs Lab 11/20/15 0800  AST 36  ALT 13*  ALKPHOS 84  BILITOT 1.1  PROT 6.7  ALBUMIN 3.7   No results for input(s): LIPASE, AMYLASE in the last 168 hours. No results for input(s): AMMONIA in the last 168 hours. Coagulation Profile: No results for input(s): INR, PROTIME in the last 168 hours. Cardiac Enzymes: No results for input(s): CKTOTAL, CKMB, CKMBINDEX, TROPONINI in the last 168 hours. BNP (last 3 results) No results for input(s): PROBNP in the last 8760 hours. HbA1C: No results for input(s): HGBA1C in the last 72 hours. CBG: No results for input(s): GLUCAP in the last 168 hours. Lipid Profile: No results for input(s): CHOL, HDL, LDLCALC, TRIG, CHOLHDL, LDLDIRECT in the last 72 hours. Thyroid Function Tests: No results for input(s): TSH, T4TOTAL, FREET4, T3FREE, THYROIDAB in the last 72 hours. Anemia Panel: No results for input(s): VITAMINB12, FOLATE, FERRITIN, TIBC, IRON, RETICCTPCT in the last 72 hours. Urine analysis:    Component Value Date/Time   COLORURINE YELLOW 01/11/2015 1030   APPEARANCEUR TURBID* 01/11/2015 1030   LABSPEC 1.016 01/11/2015 1030   PHURINE 8.0 01/11/2015 1030   GLUCOSEU NEGATIVE 01/11/2015 1030   HGBUR LARGE* 01/11/2015 1030   BILIRUBINUR NEGATIVE 01/11/2015 1030   KETONESUR NEGATIVE 01/11/2015 1030   PROTEINUR 100* 01/11/2015 1030   UROBILINOGEN 1.0 01/11/2015 1030   NITRITE NEGATIVE 01/11/2015 1030   LEUKOCYTESUR LARGE* 01/11/2015 1030   Sepsis Labs: @LABRCNTIP (procalcitonin:4,lacticidven:4)  )No results found for this or any previous visit (from the past 240 hour(s)).       Radiology Studies: No results found.      Scheduled Meds: . cholecalciferol  2,000 Units Oral Daily  . ciprofloxacin  400  mg Intravenous Q12H  . losartan  50 mg Oral Daily  . metronidazole  500 mg Intravenous Q8H  . sertraline  100 mg Oral q morning - 10a  . traZODone  50 mg Oral QHS  . vitamin B-12  1,000 mcg Oral Daily   Continuous Infusions: . sodium chloride 50 mL/hr at 11/22/15 0918     LOS: 2 days    Time spent: 25 Minutes.    Dana Allan, MD  [facility] Pager #: [phone] 7PM-7AM contact night coverage as above

## 2015-11-23 DIAGNOSIS — E869 Volume depletion, unspecified: Secondary | ICD-10-CM

## 2015-11-23 MED ORDER — PHENOL 1.4 % MT LIQD
1.0000 | OROMUCOSAL | Status: DC | PRN
  Filled 2015-11-23: qty 177

## 2015-11-23 MED ORDER — METRONIDAZOLE 500 MG PO TABS: 500.0000 mg | ORAL_TABLET | Freq: Three times a day (TID) | ORAL | Status: DC

## 2015-11-23 MED ORDER — CIPROFLOXACIN HCL 500 MG PO TABS: 500.0000 mg | ORAL_TABLET | Freq: Two times a day (BID) | ORAL | Status: DC

## 2015-11-23 NOTE — Discharge Summary (Signed)
Physician Discharge Summary  Kimberly Acevedo [id number] DOB: 1928/07/10 DOA: 11/20/2015  PCP: [name], MD  Admit date: 11/20/2015 Discharge date: 11/23/2015  Admitted From: Home Disposition:  Home  Recommendations for Outpatient Follow-up:  1. Follow up with PCP in 1-2 weeks 2. Please obtain BMP/CBC in one week 3. Please follow up on the following pending results:  Home Health: No Equipment/Devices: None recommended by PT  Discharge Condition: Stable CODE STATUS: Full Code Diet recommendation: Heart Healthy  Brief/Interim History: 82 year old female with history of diverticulosis, diverticulitis, dementia and hyperlipidemia. Patient is not able to give any history due to dementing illness. Patient was seen alongside patient's care giver. The history has come from the caregiver who also was given second hand information. Patient presents with bright red blood per rectum that started this morning. No associated abdominal pain, no syncope or dizziness, no other associated constitutional symptoms. Vitals are stable. Hemoglobin is 14.3g/dl. CT scan done on admission revealed colonic diverticulosis and sigmoid diverticulitis.   Discharge Diagnoses:  Active Problems:   GI bleed   Gastrointestinal hemorrhage associated with intestinal diverticulitis   Diverticulitis of colon   Volume depletion   1. Probable lower GI bleed. -Kimberly Acevedo is an 82 year old female admitted to the medicine service on 11/20/2015 when she presented with hematochezia. -Initial lab work revealed stable hemoglobin of 14.3. Her hemoglobin has since dropped to 10.5. -CT scan of abdomen and pelvis with IV contrast performed on 11/20/2015 showing colonic diverticulosis and sigmoid diverticulitis without evidence of abscess or perforation. -GI was consulted as she was evaluated by Dr Havery Moros  -Bleed felt to be diverticular in origin, Recommended to monitor without intervention. -Bleeding appears to  be stopped, safe for discharge home.  2. Acute diverticulitis -She originally presented with left lower quadrant abdominal pain and hematochezia. -CT scan of abdomen and pelvis performed on admission showing evidence of sigmoid diverticulitis. -Shiley started on IV Cipro and Flagyl, and discharged on oral Cipro and Flagyl for 5 more days.  3. History of dementia -She currently resides in the community with caregiver stays with her all the time. -Overnight she became agitated and was given antipsychotics -Recommended to nursing staff to remove wrist restraints -Will attempt to mobilize. -Provide Seroquel 25 mg by mouth daily at bedtime and stop IV Haldol  4. Hypertension -Blood pressures are stable -Continue losartan 50 mg by mouth daily   Discharge Instructions  Discharge Instructions    Diet - low sodium heart healthy    Complete by:  As directed      Increase activity slowly    Complete by:  As directed             Medication List    TAKE these medications        ciprofloxacin 500 MG tablet  Commonly known as:  CIPRO  Take 1 tablet (500 mg total) by mouth 2 (two) times daily.     losartan 50 MG tablet  Commonly known as:  COZAAR  Take 1 tablet (50 mg total) by mouth daily.     metroNIDAZOLE 500 MG tablet  Commonly known as:  FLAGYL  Take 1 tablet (500 mg total) by mouth 3 (three) times daily.     sertraline 100 MG tablet  Commonly known as:  ZOLOFT  Take 100 mg by mouth every morning.     vitamin B-12 1000 MCG tablet  Commonly known as:  CYANOCOBALAMIN  Take 1,000 mcg by mouth daily.     Vitamin  D 2000 units Caps  Take 1 capsule by mouth daily.           Follow-up Information    Follow up with [name], MD In 1 week.   Specialty:  Internal Medicine   Contact information:   [place] [address] [phone]      Allergies  Allergen Reactions  . Penicillins Swelling and Rash    Has patient had a  PCN reaction causing immediate rash, facial/tongue/throat swelling, SOB or lightheadedness with hypotension: No Has patient had a PCN reaction causing severe rash involving mucus membranes or skin necrosis: No Has patient had a PCN reaction that required hospitalization No Has patient had a PCN reaction occurring within the last 10 years: No If all of the above answers are "NO", then may proceed with Cephalosporin use.  . Statins   . Sulfa Drugs Cross Reactors Shortness Of Breath  . Tramadol Hcl Anaphylaxis  . Aspirin Swelling  . Codeine Other (See Comments)    unknown  . Morphine And Related Other (See Comments)    Hypoglycemia    Consultations:  GI   Procedures/Studies: Ct Abdomen Pelvis W Contrast  11/20/2015  CLINICAL DATA:  [facility] pt from home, pt. C/o bright red rectal bleeding with clots Per care giver, started 1 hour ago. Pt reports weakness and near syncope, Hx of dementia, IBS, hemorrhoids, anal skin tags, and diverticulitis EXAM: CT ABDOMEN AND PELVIS WITH CONTRAST TECHNIQUE: Multidetector CT imaging of the abdomen and pelvis was performed using the standard protocol following bolus administration of intravenous contrast. CONTRAST:  117mL ISOVUE-300 IOPAMIDOL (ISOVUE-300) INJECTION 61% COMPARISON:  01/06/2015 FINDINGS: Lower chest: There is mild subsegmental pleural-based atelectasis or scarring at the bases, right greater than left. No suspicious pulmonary nodules or pleural effusions. Trace pericardial effusion noted. Hepatobiliary: No focal abnormality identified within the liver. Gallbladder is surgically absent. Pancreas: No focal abnormality identified within the pancreas. Spleen: Spleen is homogeneous and normal in appearance. Adrenals/Urinary Tract: Adrenal glands are normal in appearance. There is no hydronephrosis. Upper pole left renal cyst is present. Stomach/Bowel: Moderate hiatal hernia is present. The stomach and small bowel loops are normal in appearance. The  appendix is well seen and has a normal appearance. There are significant colonic diverticula. There is thickening of the peritoneal reflection adjacent to the left pericolic gutter and mild inflammatory change surrounding the descending colon, consistent with acute diverticulitis. No associated perforation or abscess present. Vascular/Lymphatic: There is extensive atherosclerotic calcification of the abdominal aorta. The no aneurysm. Reproductive: Status post hysterectomy. No adnexal mass. No free pelvic fluid. Other: Within the left ischiorectal space, there is a 1.7 x 1.2 centimeter residual collections following presumed previous hematoma which previously measured greater than 7 centimeters. No new or suspicious mass identified in this region. Musculoskeletal: Degenerative changes are identified in the lower thoracic and lumbar spine. No suspicious lytic or blastic lesions are identified. IMPRESSION: 1. Colonic diverticulosis and sigmoid diverticulitis not associated with abscess or perforation. 2. Significant improvement and left perirectal hematoma since prior study, now measuring 1.7 x 1.2 centimeters. 3. Trace pericardial effusion. 4. Status post cholecystectomy. 5. Left renal cyst. 6. Moderate hiatal hernia. Electronically Signed   By: Nolon Nations M.D.   On: 11/20/2015 10:47    (Echo, Carotid, EGD, Colonoscopy, ERCP)    Subjective:   Discharge Exam: Filed Vitals:   11/22/15 2206 11/23/15 0535  BP: 153/94 167/81  Pulse: 115 107  Temp: 98 F (36.7 C) 97.8  F (36.6 C)  Resp: 19 18   Filed Vitals:   11/22/15 0519 11/22/15 1523 11/22/15 2206 11/23/15 0535  BP: 141/69 166/82 153/94 167/81  Pulse: 99 100 115 107  Temp: 97.9 F (36.6 C) 98.3 F (36.8 C) 98 F (36.7 C) 97.8 F (36.6 C)  TempSrc: Oral Oral Oral Oral  Resp: 20 18 19 18   Height:      SpO2: 100% 96% 97% 96%    General: Pt is alert, awake, not in acute distress Cardiovascular: RRR, S1/S2 +, no rubs, no  gallops Respiratory: CTA bilaterally, no wheezing, no rhonchi Abdominal: Soft, NT, ND, bowel sounds + Extremities: no edema, no cyanosis    The results of significant diagnostics from this hospitalization (including imaging, microbiology, ancillary and laboratory) are listed below for reference.     Microbiology: No results found for this or any previous visit (from the past 240 hour(s)).   Labs: BNP (last 3 results) No results for input(s): BNP in the last 8760 hours. Basic Metabolic Panel:  Recent Labs Lab 11/20/15 0800 11/20/15 1207 11/21/15 0458  NA 137  --  138  K 4.6  --  3.9  CL 106  --  106  CO2 22  --  27  GLUCOSE 147*  --  113*  BUN 16  --  11  CREATININE 0.94  --  0.78  CALCIUM 8.8*  --  8.3*  MG  --  1.8  --   PHOS  --  4.2  --    Liver Function Tests:  Recent Labs Lab 11/20/15 0800  AST 36  ALT 13*  ALKPHOS 84  BILITOT 1.1  PROT 6.7  ALBUMIN 3.7   No results for input(s): LIPASE, AMYLASE in the last 168 hours. No results for input(s): AMMONIA in the last 168 hours. CBC:  Recent Labs Lab 11/20/15 0800 11/20/15 1207 11/21/15 0458  WBC 8.6  --  6.1  HGB 14.3 12.5 10.5*  HCT 42.1 36.9 31.0*  MCV 92.5  --  90.6  PLT 198  --  154   Cardiac Enzymes: No results for input(s): CKTOTAL, CKMB, CKMBINDEX, TROPONINI in the last 168 hours. BNP: Invalid input(s): POCBNP CBG: No results for input(s): GLUCAP in the last 168 hours. D-Dimer No results for input(s): DDIMER in the last 72 hours. Hgb A1c No results for input(s): HGBA1C in the last 72 hours. Lipid Profile No results for input(s): CHOL, HDL, LDLCALC, TRIG, CHOLHDL, LDLDIRECT in the last 72 hours. Thyroid function studies No results for input(s): TSH, T4TOTAL, T3FREE, THYROIDAB in the last 72 hours.  Invalid input(s): FREET3 Anemia work up No results for input(s): VITAMINB12, FOLATE, FERRITIN, TIBC, IRON, RETICCTPCT in the last 72 hours. Urinalysis    Component Value Date/Time    COLORURINE YELLOW 01/11/2015 1030   APPEARANCEUR TURBID* 01/11/2015 1030   LABSPEC 1.016 01/11/2015 1030   PHURINE 8.0 01/11/2015 1030   GLUCOSEU NEGATIVE 01/11/2015 1030   HGBUR LARGE* 01/11/2015 1030   BILIRUBINUR NEGATIVE 01/11/2015 1030   KETONESUR NEGATIVE 01/11/2015 1030   PROTEINUR 100* 01/11/2015 1030   UROBILINOGEN 1.0 01/11/2015 1030   NITRITE NEGATIVE 01/11/2015 1030   LEUKOCYTESUR LARGE* 01/11/2015 1030   Sepsis Labs Invalid input(s): PROCALCITONIN,  WBC,  LACTICIDVEN Microbiology No results found for this or any previous visit (from the past 240 hour(s)).   Time coordinating discharge: Over 30 minutes  SIGNED:   Birdie Hopes, MD  [facility] 11/23/2015, 12:12 PM Pager   If 7PM-7AM, please contact night-coverage [email or web address]  Password [email or web address]

## 2015-11-23 NOTE — Progress Notes (Signed)
Patient's VSS, no complaints of pain, tolerating diet well.  Discharge instructions reviewed with patient and caregiver.  Patient discharged to home.

## 2015-12-01 DIAGNOSIS — G4700 Insomnia, unspecified: Secondary | ICD-10-CM | POA: Diagnosis not present

## 2015-12-01 DIAGNOSIS — Z8719 Personal history of other diseases of the digestive system: Secondary | ICD-10-CM | POA: Diagnosis not present

## 2015-12-01 DIAGNOSIS — F419 Anxiety disorder, unspecified: Secondary | ICD-10-CM | POA: Diagnosis not present

## 2015-12-12 ENCOUNTER — Telehealth: Payer: Self-pay | Admitting: Internal Medicine

## 2015-12-15 ENCOUNTER — Other Ambulatory Visit: Payer: Self-pay

## 2015-12-15 DIAGNOSIS — D509 Iron deficiency anemia, unspecified: Secondary | ICD-10-CM

## 2015-12-15 NOTE — Telephone Encounter (Signed)
Patient with continued abdominal pain and diarrhea according to the caregiver.  She is scheduled for a follow up with Nicoletta Ba PA for 12/23/15

## 2015-12-22 ENCOUNTER — Other Ambulatory Visit (INDEPENDENT_AMBULATORY_CARE_PROVIDER_SITE_OTHER): Payer: Medicare Other

## 2015-12-22 ENCOUNTER — Encounter: Payer: Self-pay | Admitting: Physician Assistant

## 2015-12-22 ENCOUNTER — Ambulatory Visit (INDEPENDENT_AMBULATORY_CARE_PROVIDER_SITE_OTHER): Payer: Medicare Other | Admitting: Physician Assistant

## 2015-12-22 VITALS — BP 132/64 | HR 70 | Ht 67.0 in | Wt 171.0 lb

## 2015-12-22 DIAGNOSIS — D62 Acute posthemorrhagic anemia: Secondary | ICD-10-CM | POA: Diagnosis not present

## 2015-12-22 DIAGNOSIS — K5732 Diverticulitis of large intestine without perforation or abscess without bleeding: Secondary | ICD-10-CM

## 2015-12-22 MED ORDER — CIPROFLOXACIN HCL 500 MG PO TABS: 500.0000 mg | ORAL_TABLET | Freq: Two times a day (BID) | ORAL | Status: DC

## 2015-12-22 MED ORDER — METRONIDAZOLE 500 MG PO TABS: 500.0000 mg | ORAL_TABLET | Freq: Two times a day (BID) | ORAL | Status: DC

## 2015-12-22 NOTE — Patient Instructions (Addendum)
We sent a prescription to [facility] for Ciprofloxin and Flagyl ( Metronidazole).   Call us after you finish the antibiotics if your not feeling better.

## 2015-12-22 NOTE — Progress Notes (Signed)
Agree with Ms. Esterwood's assessment and plan. Wen Merced E. Jordany Russett, MD, FACG   

## 2015-12-22 NOTE — Progress Notes (Signed)
Patient ID: Kimberly Acevedo, female   DOB: [birth year], 82 y.o.   MRN: [id number]   Subjective:    Patient ID: Kimberly Acevedo, female    DOB: [birth year], 82 y.o.   MRN: [id number]  HPI Kimberly Acevedo is a pleasant 82 year old white female known to Dr. Carlean Acevedo who has history of recurrent diverticulitis. She comes in today for hospital follow-up. She had been admitted June 15 through 11/23/2015 with an episode of diverticulitis. She also had some bright red rectal bleeding during that admission which was felt diverticular in origin. She did not require transfusion and hemoglobin was 10.5 on June 16 at the time of discharge. She was treated with IV antibiotics for 3 days and given 5 days of Cipro and Flagyl on discharge. CT of the abdomen and pelvis done on 11/20/2015 showed significant colonic diverticuli and mild inflammatory changes surrounding the descending colon consistent with acute diverticulitis no associated perforation or abscess. She says that her abdominal discomfort has never resolved. She continues to have soreness across her lower abdomen which is been fairly constant. This is not been severe. She has been able to eat, denies any nausea or vomiting. She's not had any documented fever but her caregiver says she has had a couple episodes of sweats. He has not had any bleeding since discharge from the hospital and though  stools have been loose she says they're more formed and is only having 1-2 bowel movements per day.  Review of Systems Pertinent positive and negative review of systems were noted in the above HPI section.  All other review of systems was otherwise negative.  Outpatient Encounter Prescriptions as of 12/22/2015  Medication Sig  . Cholecalciferol (VITAMIN D) 2000 UNITS CAPS Take 1 capsule by mouth daily.  . ciprofloxacin (CIPRO) 500 MG tablet Take 1 tablet (500 mg total) by mouth 2 (two) times daily.  Marland Kitchen losartan (COZAAR) 50 MG tablet Take 1 tablet (50 mg total) by mouth daily.  .  metroNIDAZOLE (FLAGYL) 500 MG tablet Take 1 tablet (500 mg total) by mouth 2 (two) times daily.  . sertraline (ZOLOFT) 100 MG tablet Take 100 mg by mouth every morning.   . vitamin B-12 (CYANOCOBALAMIN) 1000 MCG tablet Take 1,000 mcg by mouth daily.  . [DISCONTINUED] ciprofloxacin (CIPRO) 500 MG tablet Take 1 tablet (500 mg total) by mouth 2 (two) times daily.  . [DISCONTINUED] metroNIDAZOLE (FLAGYL) 500 MG tablet Take 1 tablet (500 mg total) by mouth 3 (three) times daily.   No facility-administered encounter medications on file as of 12/22/2015.   Allergies  Allergen Reactions  . Penicillins Swelling and Rash    Has patient had a PCN reaction causing immediate rash, facial/tongue/throat swelling, SOB or lightheadedness with hypotension: No Has patient had a PCN reaction causing severe rash involving mucus membranes or skin necrosis: No Has patient had a PCN reaction that required hospitalization No Has patient had a PCN reaction occurring within the last 10 years: No If all of the above answers are "NO", then may proceed with Cephalosporin use.  . Statins   . Sulfa Drugs Cross Reactors Shortness Of Breath  . Tramadol Hcl Anaphylaxis  . Aspirin Swelling  . Codeine Other (See Comments)    unknown  . Morphine And Related Other (See Comments)    Hypoglycemia   Patient Active Problem List   Diagnosis Date Noted  . Volume depletion   . GI bleed 11/20/2015  . Gastrointestinal hemorrhage associated with intestinal diverticulitis   . Diverticulitis of colon   .  Intra-abdominal hematoma 01/06/2015  . Normocytic normochromic anemia 01/06/2015  . Nausea vomiting and diarrhea 01/06/2015  . Bleeding internal hemorrhoids 07/16/2014  . Anal fissure - posterior 06/17/2014  . Dementia 06/15/2014  . Generalized abdominal pain   . Abdominal pain   . Pancreatitis 05/24/2014  . Leukocytosis 05/24/2014  . GERD (gastroesophageal reflux disease) 05/24/2014  . Dementia with behavioral disturbance  02/27/2014  . Rectal bleeding 05/21/2013  . Hypokalemia 05/21/2013  . Depression 05/21/2013  . Invasive lobular carcinoma of breast, stage 1 ([place]) 06/23/2012  . Hypertension   . Fibromyalgia   . Achalasia, esophageal    Social History   Social History  . Marital Status: Widowed    Spouse Name: N/A  . Number of Children: 1  . Years of Education: 11   Occupational History  . interior design   . Retired     Social History Main Topics  . Smoking status: Never Smoker   . Smokeless tobacco: Never Used  . Alcohol Use: No  . Drug Use: No  . Sexual Activity: Not on file   Other Topics Concern  . Not on file   Social History Narrative   Patient lives at home alone with 2 caregivers.    Patient is retired.    Patient has 1 child.    Patient has an 11th grade education.    Patient is right handed.     [name] family history includes Arthritis in her father; Cancer (age of onset: 40) in her mother. There is no history of Dementia.      Objective:    Filed Vitals:   12/22/15 1507  BP: 132/64  Pulse: 70    Physical Exam  well-developed elderly white female in no acute distress, pleasant accompanied by her caregiver. Blood pressure 132/64 pulse 70, height 5 foot 7 weight 171 BMI 26.7. HEENT;nontraumatic normocephalic EOMI PERRLA sclera anicteric, Cardiovascular ;regular rate and rhythm with S1-S2 no murmur or gallop, Pulmonary; clear bilaterally, Abdomen ;soft, bowel sounds are present she is tender in the right lower quadrant suprapubic and left lower quadrant no guarding or rebound no palpable mass or hepatosplenomegaly, Rectal ;exam not done, Ext; no clubbing cyanosis or edema skin warm and dry, Neuropsych ;mood and affect appropriate     Assessment & Plan:   #20  82 year old white female with recent admission for acute diverticulitis. Patient comes in today with persistence of lower abdominal pain since discharge from the hospital 3 weeks ago. I suspect she has a  persistent diverticulitis. #2 rectal bleeding with presentation of diverticulitis this may have been diverticular in origin or possible superimposed segmental colitis. She's not had any further bleeding #3 anemia secondary to above #4 status post hemorrhoidectomy 2016  Plan; check CBC Start Flagyl 500 mg by mouth twice a day 10 days, and Cipro 500 mg by mouth twice a day 10 days both to be taken with food. Patient and caregiver are asked to call if she stabbing any persistent symptoms after she completes the antibiotics and at that point would consider repeat CT. To follow-up with Dr. Carlean Acevedo or myself on an as-needed basis.   Kamden Reber Genia Harold PA-C 12/22/2015   Cc: Deland Pretty, MD

## 2015-12-23 LAB — CBC WITH DIFFERENTIAL/PLATELET
BASOS PCT: 1 % (ref 0.0–3.0)
Basophils Absolute: 0.1 10*3/uL (ref 0.0–0.1)
EOS ABS: 0.1 10*3/uL (ref 0.0–0.7)
EOS PCT: 1.2 % (ref 0.0–5.0)
HEMATOCRIT: 39.5 % (ref 36.0–46.0)
HEMOGLOBIN: 13.4 g/dL (ref 12.0–15.0)
LYMPHS PCT: 39.8 % (ref 12.0–46.0)
Lymphs Abs: 2.6 10*3/uL (ref 0.7–4.0)
MCHC: 34 g/dL (ref 30.0–36.0)
MCV: 91.6 fl (ref 78.0–100.0)
Monocytes Absolute: 0.5 10*3/uL (ref 0.1–1.0)
Monocytes Relative: 7.2 % (ref 3.0–12.0)
Neutro Abs: 3.4 10*3/uL (ref 1.4–7.7)
Neutrophils Relative %: 50.8 % (ref 43.0–77.0)
Platelets: 219 10*3/uL (ref 150.0–400.0)
RBC: 4.32 Mil/uL (ref 3.87–5.11)
RDW: 13.6 % (ref 11.5–15.5)
WBC: 6.6 10*3/uL (ref 4.0–10.5)

## 2016-02-02 ENCOUNTER — Other Ambulatory Visit: Payer: Self-pay

## 2016-02-02 ENCOUNTER — Ambulatory Visit: Payer: Medicare Other | Admitting: Internal Medicine

## 2016-04-22 ENCOUNTER — Inpatient Hospital Stay (HOSPITAL_COMMUNITY)
Admission: AD | Admit: 2016-04-22 | Discharge: 2016-04-22 | Disposition: A | Discharge status: Home / Self Care | Payer: Medicare Other | Source: Ambulatory Visit | Attending: Obstetrics & Gynecology | Admitting: Obstetrics & Gynecology

## 2016-04-22 ENCOUNTER — Encounter (HOSPITAL_COMMUNITY): Payer: Self-pay

## 2016-04-22 DIAGNOSIS — I10 Essential (primary) hypertension: Secondary | ICD-10-CM | POA: Diagnosis not present

## 2016-04-22 DIAGNOSIS — Z880 Allergy status to penicillin: Secondary | ICD-10-CM | POA: Insufficient documentation

## 2016-04-22 DIAGNOSIS — N764 Abscess of vulva: Secondary | ICD-10-CM | POA: Diagnosis not present

## 2016-04-22 DIAGNOSIS — F0390 Unspecified dementia without behavioral disturbance: Secondary | ICD-10-CM | POA: Insufficient documentation

## 2016-04-22 DIAGNOSIS — G4733 Obstructive sleep apnea (adult) (pediatric): Secondary | ICD-10-CM | POA: Diagnosis not present

## 2016-04-22 MED ORDER — CEPHALEXIN 500 MG PO CAPS: 500.0000 mg | ORAL_CAPSULE | Freq: Four times a day (QID) | ORAL | 0 refills | Status: DC

## 2016-04-22 NOTE — Discharge Instructions (Signed)
Apply warm compress to the vulva for 15 minutes every hour. You can keep the warm compress on longer if you can tolerate it  Skin Abscess A skin abscess is an infected area on or under your skin that contains pus and other material. An abscess can happen almost anywhere on your body. Some abscesses break open (rupture) on their own. Most continue to get worse unless they are treated. The infection can spread deeper into the body and into your blood, which can make you feel sick. Treatment usually involves draining the abscess. Follow these instructions at home: Abscess Care  If you have an abscess that has not drained, place a warm, clean, wet washcloth over the abscess several times a day. Do this as told by your doctor.  Follow instructions from your doctor about how to take care of your abscess. Make sure you:  Cover the abscess with a bandage (dressing).  Change your bandage or gauze as told by your doctor.  Wash your hands with soap and water before you change the bandage or gauze. If you cannot use soap and water, use hand sanitizer.  Check your abscess every day for signs that the infection is getting worse. Check for:  More redness, swelling, or pain.  More fluid or blood.  Warmth.  More pus or a bad smell. Medicines  Take over-the-counter and prescription medicines only as told by your doctor.  If you were prescribed an antibiotic medicine, take it as told by your doctor. Do not stop taking the antibiotic even if you start to feel better. General instructions  To avoid spreading the infection:  Do not share personal care items, towels, or hot tubs with others.  Avoid making skin-to-skin contact with other people.  Keep all follow-up visits as told by your doctor. This is important. Contact a doctor if:  You have more redness, swelling, or pain around your abscess.  You have more fluid or blood coming from your abscess.  Your abscess feels warm when you touch  it.  You have more pus or a bad smell coming from your abscess.  You have a fever.  Your muscles ache.  You have chills.  You feel sick. Get help right away if:  You have very bad (severe) pain.  You see red streaks on your skin spreading away from the abscess. This information is not intended to replace advice given to you by your health care provider. Make sure you discuss any questions you have with your health care provider. Document Released: 11/10/2007 Document Revised: 01/18/2016 Document Reviewed: 04/02/2015 Elsevier Interactive Patient Education  2017 [employer].

## 2016-04-22 NOTE — MAU Provider Note (Signed)
History     CSN: [id number]  Arrival date and time: 04/22/16 1044   None     Chief Complaint  Patient presents with  . Cyst   HPI 82 yo presenting today for the evaluation of a 6-day vulva discomfort. Patient feels that a boil has developed. It was initially 2 small ones but not feels that it has increased in size. She denies any fevers or drainage from the area. Patient denies any vulva pruritis. She denies diabetes. She wears a diaper and care taker states that she cares for herself in terms of bathing and changing the diaper. Caretaker states that she is still very independent but has a 24 hour caretaker to prevent injuries.    Past Medical History:  Diagnosis Date  . Anal fissure   . Arthritis   . Bilateral edema of lower extremity   . Cognitive dysfunction    DUE TO DEMENTIA--  PER NEUROLOGIST NOTE SEVERE  . Dementia with behavioral disturbance    PER NEUROLOGIST NOTE PT ABLE TO DO ADL'S BUT UNABLE TO CARE FOR HOME AND NEEDED AND PT HAS 24 HOUR CARE  . Dyslipidemia   . Fibromyalgia   . GERD (gastroesophageal reflux disease)   . Hemorrhoids   . History of breast cancer oncologist-  dr Humphrey Rolls--- no recurrence   dx 05-19-2012  Stage I , Invasive Lobular/ LCIS & microinvasive ductal and DCIS (T1 A NX M0)--  s/p  left partial mastectomy 07-04-2012 and radiation  . History of diverticulitis of colon   . History of radiation therapy 08-10-2012 to 09-08-2012   Left breast 750 cGy in 3 sessions  . History of squamous cell carcinoma excision    NOSE  . Hypertension   . OSA on CPAP   . Prolapsed internal hemorrhoids, grade 3   . Varicose veins     Past Surgical History:  Procedure Laterality Date  . BREAST LUMPECTOMY WITH NEEDLE LOCALIZATION  07/04/2012   Procedure: BREAST LUMPECTOMY WITH NEEDLE LOCALIZATION;  Surgeon: Edward Jolly, MD;  Location: [facility];  Service: General;  Laterality: Left;  left  . CHOLECYSTECTOMY  2007  . COLONOSCOPY    .  ESOPHAGEAL DILATION    . HEMORRHOID BANDING  Mar & Apr 2016  . HEMORRHOID SURGERY N/A 12/25/2014   Procedure: HEMORRHOIDOPEXY WITH ANAL EXAM;  Surgeon: Leighton Ruff, MD;  Location: [facility];  Service: General;  Laterality: N/A;  . [place]    . TOTAL ABDOMINAL HYSTERECTOMY  1976  . VARICOSE VEIN SURGERY  2010   left lower extremity    Family History  Problem Relation Age of Onset  . Cancer Mother 11    melanoma ca  . Arthritis Father   . Dementia Neg Hx     Social History  Substance Use Topics  . Smoking status: Never Smoker  . Smokeless tobacco: Never Used  . Alcohol use No    Allergies:  Allergies  Allergen Reactions  . Penicillins Swelling and Rash    Has patient had a PCN reaction causing immediate rash, facial/tongue/throat swelling, SOB or lightheadedness with hypotension: No Has patient had a PCN reaction causing severe rash involving mucus membranes or skin necrosis: No Has patient had a PCN reaction that required hospitalization No Has patient had a PCN reaction occurring within the last 10 years: No If all of the above answers are "NO", then may proceed with Cephalosporin use.  . Statins   . Sulfa Drugs Cross Reactors Shortness  Of Breath  . Tramadol Hcl Anaphylaxis  . Aspirin Swelling  . Codeine Other (See Comments)    unknown  . Morphine And Related Other (See Comments)    Hypoglycemia    Prescriptions Prior to Admission  Medication Sig Dispense Refill Last Dose  . Cholecalciferol (VITAMIN D) 2000 UNITS CAPS Take 1 capsule by mouth daily.   11/19/2015 at Unknown time  . ciprofloxacin (CIPRO) 500 MG tablet Take 1 tablet (500 mg total) by mouth 2 (two) times daily. 20 tablet 0   . losartan (COZAAR) 50 MG tablet Take 1 tablet (50 mg total) by mouth daily. 30 tablet 0 11/19/2015 at Unknown time  . metroNIDAZOLE (FLAGYL) 500 MG tablet Take 1 tablet (500 mg total) by mouth 2 (two) times daily. 20 tablet 0   . sertraline (ZOLOFT) 100 MG  tablet Take 100 mg by mouth every morning.    11/19/2015 at Unknown time  . vitamin B-12 (CYANOCOBALAMIN) 1000 MCG tablet Take 1,000 mcg by mouth daily.   11/19/2015 at Unknown time    ROS  See pertinent in HPI Physical Exam   Blood pressure 158/72, pulse 76, temperature 98.1 F (36.7 C), resp. rate 18.  Physical Exam GENERAL: Well-developed, well-nourished female in no acute distress.  NECK: Supple. Normal thyroid.  ABDOMEN: Soft, nontender, nondistended.  PELVIC: Normal external female genitalia.  With a 3 x 2 cm area of erythema and  induration on the right labia, slightly tender to touch, without any areas of fluctuance. EXTREMITIES: No cyanosis, clubbing, or edema, 2+ distal pulses.  MAU Course  Procedures  MDM   Assessment and Plan  82 yo with an early stage vulva abscess - Rx Keflex provided - Advised to apply warm compresses to the area every hour for 15 minutes - Follow up at [facility] in 2 weeks.  - Patient to return to MAU if symptoms worsen   Cloyce Blankenhorn 04/22/2016, 11:27 AM

## 2016-04-22 NOTE — MAU Note (Signed)
Pt noticed a "boil/cyst on the inside of her right groin on Saturday. Bartholome Bill gotten worse and more painful.

## 2016-05-10 ENCOUNTER — Ambulatory Visit: Payer: Medicare Other | Admitting: Obstetrics and Gynecology

## 2016-05-13 ENCOUNTER — Inpatient Hospital Stay (HOSPITAL_COMMUNITY)
Admission: AD | Admit: 2016-05-13 | Discharge: 2016-05-13 | Disposition: A | Discharge status: Other Acute Inpatient Hospital | Payer: Medicare Other | Source: Ambulatory Visit | Attending: Obstetrics and Gynecology | Admitting: Obstetrics and Gynecology

## 2016-05-13 ENCOUNTER — Ambulatory Visit: Payer: Medicare Other | Admitting: Obstetrics and Gynecology

## 2016-05-13 DIAGNOSIS — N764 Abscess of vulva: Secondary | ICD-10-CM

## 2016-05-13 NOTE — Progress Notes (Signed)
Patient was supposed to have a followup of abscess at [facility] at 1:00pm today  Came to MAU by accident.  Called office and they will see her  She and caregiver are headed over there  Seabron Spates, CNM

## 2016-07-02 ENCOUNTER — Emergency Department (HOSPITAL_COMMUNITY)
Admission: EM | Admit: 2016-07-02 | Discharge: 2016-07-02 | Disposition: A | Discharge status: Home / Self Care | Payer: Medicare Other | Attending: Emergency Medicine | Admitting: Emergency Medicine

## 2016-07-02 ENCOUNTER — Encounter (HOSPITAL_COMMUNITY): Payer: Self-pay | Admitting: Emergency Medicine

## 2016-07-02 DIAGNOSIS — R030 Elevated blood-pressure reading, without diagnosis of hypertension: Secondary | ICD-10-CM | POA: Diagnosis not present

## 2016-07-02 DIAGNOSIS — Z853 Personal history of malignant neoplasm of breast: Secondary | ICD-10-CM | POA: Diagnosis not present

## 2016-07-02 DIAGNOSIS — R040 Epistaxis: Secondary | ICD-10-CM | POA: Diagnosis not present

## 2016-07-02 DIAGNOSIS — I10 Essential (primary) hypertension: Secondary | ICD-10-CM

## 2016-07-02 DIAGNOSIS — Z79899 Other long term (current) drug therapy: Secondary | ICD-10-CM | POA: Insufficient documentation

## 2016-07-02 MED ORDER — LOSARTAN POTASSIUM 50 MG PO TABS: 50.0000 mg | ORAL_TABLET | Freq: Two times a day (BID) | ORAL | 0 refills | Status: DC

## 2016-07-02 MED ORDER — LOSARTAN POTASSIUM 50 MG PO TABS
50.0000 mg | ORAL_TABLET | Freq: Once | ORAL | Status: AC
  Administered 2016-07-02: 50 mg via ORAL
  Filled 2016-07-02 (×2): qty 1

## 2016-07-02 NOTE — ED Notes (Signed)
Pt and caregiver leaving the treatment room, and ready to go. Paperwork and prescriptions given, caregiver verbalized understanding, discharge papers not signed.

## 2016-07-02 NOTE — Discharge Instructions (Signed)
Start taking your Cozaar once in the morning and once at night. Make an appointment with Dr. Shelia Media for next week to have your blood pressure rechecked and to discuss your medication. Return here as needed.

## 2016-07-02 NOTE — ED Triage Notes (Signed)
Per EMS, pt from home with nose bleed lasting 1 hour. Bleeding controlled on arrival. Initial BP with EMS was 200/110, BP upon arrival was 168/98. No hx of nose bleeds, pt does not take blood thinners.

## 2016-07-02 NOTE — ED Provider Notes (Signed)
[facility] DEPT Provider Note   CSN: [id number] Arrival date & time: 07/02/16  1950     History   Chief Complaint Chief Complaint  Patient presents with  . Epistaxis    HPI Kimberly Acevedo is a 83 y.o. female.  Patient with history of HTN, breast CA, dementia, diverticulitis with bleed presents with complaint of nosebleed that started earlier this evening. Bleeding last about one hour. No injury prior, no headache, lightheadedness, syncope. She lives at home with in-home caregivers who dispense medications and, so, is compliant with antihypertensive medication. She takes Cozaar 50 mg in the mornings. No bleeding currently.   The history is provided by the patient and a relative. No language interpreter was used.  Epistaxis      Past Medical History:  Diagnosis Date  . Anal fissure   . Arthritis   . Bilateral edema of lower extremity   . Cognitive dysfunction    DUE TO DEMENTIA--  PER NEUROLOGIST NOTE SEVERE  . Dementia with behavioral disturbance    PER NEUROLOGIST NOTE PT ABLE TO DO ADL'S BUT UNABLE TO CARE FOR HOME AND NEEDED AND PT HAS 24 HOUR CARE  . Dyslipidemia   . Fibromyalgia   . GERD (gastroesophageal reflux disease)   . Hemorrhoids   . History of breast cancer oncologist-  dr Humphrey Rolls--- no recurrence   dx 05-19-2012  Stage I , Invasive Lobular/ LCIS & microinvasive ductal and DCIS (T1 A NX M0)--  s/p  left partial mastectomy 07-04-2012 and radiation  . History of diverticulitis of colon   . History of radiation therapy 08-10-2012 to 09-08-2012   Left breast 750 cGy in 3 sessions  . History of squamous cell carcinoma excision    NOSE  . Hypertension   . OSA on CPAP   . Prolapsed internal hemorrhoids, grade 3   . Varicose veins     Patient Active Problem List   Diagnosis Date Noted  . Volume depletion   . GI bleed 11/20/2015  . Gastrointestinal hemorrhage associated with intestinal diverticulitis   . Diverticulitis of colon   . Intra-abdominal  hematoma 01/06/2015  . Normocytic normochromic anemia 01/06/2015  . Nausea vomiting and diarrhea 01/06/2015  . Bleeding internal hemorrhoids 07/16/2014  . Anal fissure - posterior 06/17/2014  . Dementia 06/15/2014  . Generalized abdominal pain   . Abdominal pain   . Pancreatitis 05/24/2014  . Leukocytosis 05/24/2014  . GERD (gastroesophageal reflux disease) 05/24/2014  . Dementia with behavioral disturbance 02/27/2014  . Rectal bleeding 05/21/2013  . Hypokalemia 05/21/2013  . Depression 05/21/2013  . Invasive lobular carcinoma of breast, stage 1 ([place]) 06/23/2012  . Hypertension   . Fibromyalgia   . Achalasia, esophageal     Past Surgical History:  Procedure Laterality Date  . BREAST LUMPECTOMY WITH NEEDLE LOCALIZATION  07/04/2012   Procedure: BREAST LUMPECTOMY WITH NEEDLE LOCALIZATION;  Surgeon: Edward Jolly, MD;  Location: [facility];  Service: General;  Laterality: Left;  left  . CHOLECYSTECTOMY  2007  . COLONOSCOPY    . ESOPHAGEAL DILATION    . HEMORRHOID BANDING  Mar & Apr 2016  . HEMORRHOID SURGERY N/A 12/25/2014   Procedure: HEMORRHOIDOPEXY WITH ANAL EXAM;  Surgeon: Leighton Ruff, MD;  Location: [place];  Service: General;  Laterality: N/A;  . [place]    . TOTAL ABDOMINAL HYSTERECTOMY  1976  . VARICOSE VEIN SURGERY  2010   left lower extremity    OB History    No  data available       Home Medications    Prior to Admission medications   Medication Sig Start Date End Date Taking? Authorizing Provider  cephALEXin (KEFLEX) 500 MG capsule Take 1 capsule (500 mg total) by mouth 4 (four) times daily. 04/22/16   Peggy Constant, MD  Cholecalciferol (VITAMIN D) 2000 UNITS CAPS Take 1 capsule by mouth daily.    Historical Provider, MD  losartan (COZAAR) 50 MG tablet Take 1 tablet (50 mg total) by mouth daily. 01/13/15   Florencia Reasons, MD  sertraline (ZOLOFT) 100 MG tablet Take 100 mg by mouth every morning.     Historical Provider,  MD  vitamin B-12 (CYANOCOBALAMIN) 1000 MCG tablet Take 1,000 mcg by mouth daily.    Historical Provider, MD    Family History Family History  Problem Relation Age of Onset  . Cancer Mother 64    melanoma ca  . Arthritis Father   . Dementia Neg Hx     Social History Social History  Substance Use Topics  . Smoking status: Never Smoker  . Smokeless tobacco: Never Used  . Alcohol use No     Allergies   Penicillins; Statins; Sulfa drugs cross reactors; Tramadol hcl; Aspirin; Codeine; and Morphine and related   Review of Systems Review of Systems  Constitutional: Negative for activity change and diaphoresis.  HENT: Positive for nosebleeds. Negative for facial swelling and trouble swallowing.   Respiratory: Negative for shortness of breath.   Cardiovascular: Negative for chest pain.  Gastrointestinal: Negative for nausea.  Neurological: Negative for syncope, weakness, light-headedness and headaches.     Physical Exam Updated Vital Signs BP 145/65 (BP Location: Right Arm)   Pulse 79   Temp 98 F (36.7 C) (Oral)   Resp 16   Ht 5\' 7"  (1.702 m)   Wt 68 kg   SpO2 95%   BMI 23.49 kg/m   Physical Exam  Constitutional: She appears well-developed and well-nourished.  HENT:  Head: Normocephalic.  No current epistaxis. Site of bleed visualized in right naris, anterior septum.  Neck: Normal range of motion. Neck supple.  Cardiovascular: Normal rate and regular rhythm.   No murmur heard. Pulmonary/Chest: Effort normal and breath sounds normal. She has no wheezes. She has no rales.  Abdominal: Soft. Bowel sounds are normal. There is no tenderness. There is no rebound and no guarding.  Musculoskeletal: Normal range of motion.  Neurological: She is alert.  Skin: Skin is warm and dry. No rash noted.  Psychiatric: She has a normal mood and affect.     ED Treatments / Results  Labs (all labs ordered are listed, but only abnormal results are displayed) Labs Reviewed - No  data to display  EKG  EKG Interpretation None       Radiology No results found.  Procedures Procedures (including critical care time)  Medications Ordered in ED Medications - No data to display   Initial Impression / Assessment and Plan / ED Course  I have reviewed the triage vital signs and the nursing notes.  Pertinent labs & imaging results that were available during my care of the patient were reviewed by me and considered in my medical decision making (see chart for details).     Patient presents with c/o epistaxis, currently resolved. Anterior right sided bleed. Blood pressure elevated per EMS 200/110, with manual pressure currently at 158/88.   Will dose her Cozaar here tonight and recommend twice daily dosing until seen by Dr. Shelia Media next week.  Final Clinical Impressions(s) / ED Diagnoses   Final diagnoses:  None   1. Epistaxis 2. Hypertension  New Prescriptions New Prescriptions   No medications on file     Charlann Lange, Hershal Coria 07/02/16 2105    Charlesetta Shanks, MD 07/05/16 1710

## 2016-07-20 ENCOUNTER — Encounter: Payer: Self-pay | Admitting: Family Medicine

## 2016-07-20 ENCOUNTER — Ambulatory Visit (INDEPENDENT_AMBULATORY_CARE_PROVIDER_SITE_OTHER): Payer: Medicare Other | Admitting: Family Medicine

## 2016-07-20 VITALS — BP 150/80 | HR 76 | Resp 12 | Ht 67.0 in | Wt 177.1 lb

## 2016-07-20 DIAGNOSIS — R739 Hyperglycemia, unspecified: Secondary | ICD-10-CM | POA: Diagnosis not present

## 2016-07-20 DIAGNOSIS — F0391 Unspecified dementia with behavioral disturbance: Secondary | ICD-10-CM

## 2016-07-20 DIAGNOSIS — I10 Essential (primary) hypertension: Secondary | ICD-10-CM

## 2016-07-20 MED ORDER — LOSARTAN POTASSIUM 100 MG PO TABS: 100.0000 mg | ORAL_TABLET | Freq: Every day | ORAL | 1 refills | Status: DC

## 2016-07-20 NOTE — Patient Instructions (Signed)
A few things to remember from today's visit:   Hypertension, essential, benign - Plan: losartan (COZAAR) 100 MG tablet, Basic metabolic panel, Hemoglobin A1c  Hyperglycemia - Plan: Basic metabolic panel, Hemoglobin A1c  Cozaar changed to one tab 100 mg.   Please be sure medication list is accurate. If a new problem present, please set up appointment sooner than planned today.

## 2016-07-20 NOTE — Progress Notes (Signed)
HPI:   Kimberly.Kimberly Acevedo is a 83 y.o. female, who is here today with her caregiver to establish care with me.  Former PCP: Dr Shelia Media.   Chronic medical problems: Dementia,HTN, AVM's in cecum found on colonoscopy 07/14/2008, and breast cancer.  Dx with breast cancer Jul 14, 2012, s/p left partial mastectomy and radiation. She follows with oncologist,Dr Burr Medico. She has followed with Dr Jaynee Eagles, 02/2014.  She lives alone, has 2 aids that some in the morning and afternoon to helps with ADL's and IADL's. But when I asked Kimberly Acevedo she tells me that she is independent and that she drives. According to caregiver she needs 24 hour care, medications are provided by caregiver. A daughter lives close by.  She does not have a cane or [place]. Denies falls in the past year and denies depression symptoms.She has had Sertraline Rx in the past for depression and anxiety, not longer taking it. According to caregiver , she took med for 2 months, it was in 2010-07-14 after the death of her husband.Seroquel has ben recommended in the past for behavioral problems.   Concerns today: Elevated BP. She was seen in the ER on 07/02/16 because epistaxis, BP was elevated and Cozaar was increased from 50 mg daily to 100 mg.   Nose bleed has resolved. BP readings at home 140-150's/70-80's. According to care giver she is not taking 2 tabs most of te time.    She has not noted unusual headache, visual changes, exertional chest pain, dyspnea,  focal weakness, or edema.   Lab Results  Component Value Date   CREATININE 0.78 11/21/2015   BUN 11 11/21/2015   NA 138 11/21/2015   K 3.9 11/21/2015   CL 106 11/21/2015   CO2 27 11/21/2015   eGFR 54.  Noted elevated glucose when reviewing labs done in 2017:113.147,129.   Review of Systems  Constitutional: Negative for appetite change, fatigue and fever.  HENT: Negative for mouth sores, nosebleeds and trouble swallowing.   Eyes: Negative for redness and visual disturbance.    Respiratory: Negative for cough, shortness of breath and wheezing.   Cardiovascular: Negative for chest pain, palpitations and leg swelling.  Gastrointestinal: Negative for abdominal pain, nausea and vomiting.       Negative for changes in bowel habits.  Endocrine: Negative for polydipsia, polyphagia and polyuria.  Genitourinary: Negative for decreased urine volume and hematuria.  Musculoskeletal: Negative for gait problem and myalgias.  Skin: Negative for rash.  Neurological: Negative for syncope, weakness and headaches.  Psychiatric/Behavioral: Negative for agitation. The patient is nervous/anxious.       Current Outpatient Prescriptions on File Prior to Visit  Medication Sig Dispense Refill  . Cholecalciferol (VITAMIN D) 2000 UNITS CAPS Take 1 capsule by mouth daily.    . vitamin B-12 (CYANOCOBALAMIN) 1000 MCG tablet Take 1,000 mcg by mouth daily.     No current facility-administered medications on file prior to visit.      Past Medical History:  Diagnosis Date  . Anal fissure   . Arthritis   . Bilateral edema of lower extremity   . Cognitive dysfunction    DUE TO DEMENTIA--  PER NEUROLOGIST NOTE SEVERE  . Dementia with behavioral disturbance    PER NEUROLOGIST NOTE PT ABLE TO DO ADL'S BUT UNABLE TO CARE FOR HOME AND NEEDED AND PT HAS 24 HOUR CARE  . Dyslipidemia   . Fibromyalgia   . GERD (gastroesophageal reflux disease)   . Hemorrhoids   . History of  breast cancer oncologist-  dr khan--- no recurrence   dx 05-19-2012  Stage I , Invasive Lobular/ LCIS & microinvasive ductal and DCIS (T1 A NX M0)--  s/p  left partial mastectomy 07-04-2012 and radiation  . History of diverticulitis of colon   . History of radiation therapy 08-10-2012 to 09-08-2012   Left breast 750 cGy in 3 sessions  . History of squamous cell carcinoma excision    NOSE  . Hypertension   . OSA on CPAP   . Prolapsed internal hemorrhoids, grade 3   . Varicose veins    Allergies  Allergen Reactions   . Penicillins Swelling and Rash    Has patient had a PCN reaction causing immediate rash, facial/tongue/throat swelling, SOB or lightheadedness with hypotension: No Has patient had a PCN reaction causing severe rash involving mucus membranes or skin necrosis: No Has patient had a PCN reaction that required hospitalization No Has patient had a PCN reaction occurring within the last 10 years: No If all of the above answers are "NO", then may proceed with Cephalosporin use.  . Statins   . Sulfa Drugs Cross Reactors Shortness Of Breath  . Tramadol Hcl Anaphylaxis  . Aspirin Swelling  . Codeine Other (See Comments)    unknown  . Morphine And Related Other (See Comments)    Hypoglycemia    Family History  Problem Relation Age of Onset  . Cancer Mother 33    melanoma ca  . Arthritis Father   . Dementia Neg Hx     Social History   Social History  . Marital status: Widowed    Spouse name: N/A  . Number of children: 1  . Years of education: 11   Occupational History  . interior design   . Retired     Social History Main Topics  . Smoking status: Never Smoker  . Smokeless tobacco: Never Used  . Alcohol use No  . Drug use: No  . Sexual activity: Not Asked   Other Topics Concern  . None   Social History Narrative   Patient lives at home alone with 2 caregivers.    Patient is retired.    Patient has 1 child.    Patient has an 11th grade education.    Patient is right handed.     Vitals:   07/20/16 1449  BP: (!) 150/80  Pulse: 76  Resp: 12   o2 sat at RA 96% Body mass index is 27.74 kg/m.   Physical Exam  Nursing note and vitals reviewed. Constitutional: She appears well-developed. She is cooperative. No distress.  HENT:  Head: Atraumatic.  Mouth/Throat: Uvula is midline, oropharynx is clear and moist and mucous membranes are normal.  Eyes: Conjunctivae and EOM are normal.  Cardiovascular: Normal rate and regular rhythm.   Murmur (soft SEM RUSB and LUSB)  heard. Pulses:      Dorsalis pedis pulses are 2+ on the right side, and 2+ on the left side.  Respiratory: Effort normal and breath sounds normal. No respiratory distress.  GI: Soft. She exhibits no mass. There is no hepatomegaly. There is no tenderness.  Musculoskeletal: She exhibits no edema.  Lymphadenopathy:    She has no cervical adenopathy.  Neurological: She is alert. She has normal strength. Coordination normal.  Otherwise stable gait with no assistance.  Skin: Skin is warm. No erythema.  Psychiatric: She has a normal mood and affect.  Well groomed, good eye contact.      ASSESSMENT AND PLAN:     Imoni was seen today for establish care.  Diagnoses and all orders for this visit:  Hypertension, essential, benign  Mildly elevated. We discussed some side effects of antihypertensive medications. She has not been compliant with taking 2 tabs daily, so will change cozaar from 50 mg to 100 mg to take once daily. Low salt diet recommended. Continue monitoring BP at home. Follow-up in 2 months.  -     losartan (COZAAR) 100 MG tablet; Take 1 tablet (100 mg total) by mouth daily. -     Basic metabolic panel -     Hemoglobin A1c  Hyperglycemia  Further recommendations would be given according to lab results.  -     Basic metabolic panel -     Hemoglobin A1c  Dementia with behavioral disturbance, unspecified dementia type  Not clear if she is still following with neurologists elsewhere. She is not on pharmacologic treatment. 24 hours care needed and currently being provided, so far she has been able to stay in her house. We will continue following.      G. , MD  [facility]. [place] office.              

## 2016-07-20 NOTE — Progress Notes (Signed)
Pre visit review using our clinic review tool, if applicable. No additional management support is needed unless otherwise documented below in the visit note. 

## 2016-07-21 LAB — BASIC METABOLIC PANEL
BUN: 13 mg/dL (ref 6–23)
CHLORIDE: 106 meq/L (ref 96–112)
CO2: 26 meq/L (ref 19–32)
Calcium: 9.3 mg/dL (ref 8.4–10.5)
Creatinine, Ser: 0.85 mg/dL (ref 0.40–1.20)
GFR: 67.41 mL/min (ref >60.00)
GLUCOSE: 85 mg/dL (ref 70–99)
POTASSIUM: 4.1 meq/L (ref 3.5–5.1)
Sodium: 139 mEq/L (ref 135–145)

## 2016-07-21 LAB — HEMOGLOBIN A1C: Hgb A1c MFr Bld: 6.5 % (ref 4.6–6.5)

## 2016-07-22 ENCOUNTER — Encounter (HOSPITAL_COMMUNITY): Payer: Self-pay | Admitting: Emergency Medicine

## 2016-07-22 ENCOUNTER — Emergency Department (HOSPITAL_COMMUNITY)
Admission: EM | Admit: 2016-07-22 | Discharge: 2016-07-22 | Disposition: A | Discharge status: Home / Self Care | Payer: Medicare Other | Attending: Emergency Medicine | Admitting: Emergency Medicine

## 2016-07-22 DIAGNOSIS — Z79899 Other long term (current) drug therapy: Secondary | ICD-10-CM | POA: Diagnosis not present

## 2016-07-22 DIAGNOSIS — R404 Transient alteration of awareness: Secondary | ICD-10-CM | POA: Diagnosis not present

## 2016-07-22 DIAGNOSIS — I10 Essential (primary) hypertension: Secondary | ICD-10-CM | POA: Diagnosis not present

## 2016-07-22 DIAGNOSIS — Z853 Personal history of malignant neoplasm of breast: Secondary | ICD-10-CM | POA: Insufficient documentation

## 2016-07-22 DIAGNOSIS — R42 Dizziness and giddiness: Secondary | ICD-10-CM | POA: Diagnosis not present

## 2016-07-22 DIAGNOSIS — R040 Epistaxis: Secondary | ICD-10-CM | POA: Insufficient documentation

## 2016-07-22 MED ORDER — OXYMETAZOLINE HCL 0.05 % NA SOLN
1.0000 | Freq: Once | NASAL | Status: AC
  Administered 2016-07-22: 1 via NASAL
  Filled 2016-07-22: qty 15

## 2016-07-22 NOTE — ED Provider Notes (Signed)
[facility] DEPT Provider Note   CSN: [id number] Arrival date & time: 07/22/16  [id number]     History   Chief Complaint Chief Complaint  Patient presents with  . Epistaxis    HPI Kimberly Acevedo is a 83 y.o. female.  Pt presents to the ED today with a nose bleed from the right nare.  Pt said it started about an hour pta.  Pt has dementia and has in home care givers.  Care giver is here with patient.  The pt's initial bp was high per EMS, but she did take her meds when they arrived.  Pt was here on 1/29 for the same.  The pt denies any trauma.      Past Medical History:  Diagnosis Date  . Anal fissure   . Arthritis   . Bilateral edema of lower extremity   . Cognitive dysfunction    DUE TO DEMENTIA--  PER NEUROLOGIST NOTE SEVERE  . Dementia with behavioral disturbance    PER NEUROLOGIST NOTE PT ABLE TO DO ADL'S BUT UNABLE TO CARE FOR HOME AND NEEDED AND PT HAS 24 HOUR CARE  . Dyslipidemia   . Fibromyalgia   . GERD (gastroesophageal reflux disease)   . Hemorrhoids   . History of breast cancer oncologist-  dr Humphrey Rolls--- no recurrence   dx 05-19-2012  Stage I , Invasive Lobular/ LCIS & microinvasive ductal and DCIS (T1 A NX M0)--  s/p  left partial mastectomy 07-04-2012 and radiation  . History of diverticulitis of colon   . History of radiation therapy 08-10-2012 to 09-08-2012   Left breast 750 cGy in 3 sessions  . History of squamous cell carcinoma excision    NOSE  . Hypertension   . OSA on CPAP   . Prolapsed internal hemorrhoids, grade 3   . Varicose veins     Patient Active Problem List   Diagnosis Date Noted  . Volume depletion   . GI bleed 11/20/2015  . Gastrointestinal hemorrhage associated with intestinal diverticulitis   . Diverticulitis of colon   . Intra-abdominal hematoma 01/06/2015  . Normocytic normochromic anemia 01/06/2015  . Nausea vomiting and diarrhea 01/06/2015  . Bleeding internal hemorrhoids 07/16/2014  . Anal fissure - posterior 06/17/2014  .  Dementia 06/15/2014  . Generalized abdominal pain   . Abdominal pain   . Pancreatitis 05/24/2014  . Leukocytosis 05/24/2014  . GERD (gastroesophageal reflux disease) 05/24/2014  . Dementia with behavioral disturbance 02/27/2014  . Rectal bleeding 05/21/2013  . Hypokalemia 05/21/2013  . Depression 05/21/2013  . Invasive lobular carcinoma of breast, stage 1 ([place]) 06/23/2012  . Hypertension, essential, benign   . Fibromyalgia   . Achalasia, esophageal     Past Surgical History:  Procedure Laterality Date  . BREAST LUMPECTOMY WITH NEEDLE LOCALIZATION  07/04/2012   Procedure: BREAST LUMPECTOMY WITH NEEDLE LOCALIZATION;  Surgeon: Edward Jolly, MD;  Location: [facility];  Service: General;  Laterality: Left;  left  . CHOLECYSTECTOMY  2007  . COLONOSCOPY    . ESOPHAGEAL DILATION    . HEMORRHOID BANDING  Mar & Apr 2016  . HEMORRHOID SURGERY N/A 12/25/2014   Procedure: HEMORRHOIDOPEXY WITH ANAL EXAM;  Surgeon: Leighton Ruff, MD;  Location: [facility];  Service: General;  Laterality: N/A;  . [place]    . TOTAL ABDOMINAL HYSTERECTOMY  1976  . VARICOSE VEIN SURGERY  2010   left lower extremity    OB History    No data available  Home Medications    Prior to Admission medications   Medication Sig Start Date End Date Taking? Authorizing Provider  Cholecalciferol (VITAMIN D) 2000 UNITS CAPS Take 1 capsule by mouth daily.    Historical Provider, MD  losartan (COZAAR) 100 MG tablet Take 1 tablet (100 mg total) by mouth daily. 07/20/16   Kimberly G Martinique, MD  vitamin B-12 (CYANOCOBALAMIN) 1000 MCG tablet Take 1,000 mcg by mouth daily.    Historical Provider, MD    Family History Family History  Problem Relation Age of Onset  . Cancer Mother 50    melanoma ca  . Arthritis Father   . Dementia Neg Hx     Social History Social History  Substance Use Topics  . Smoking status: Never Smoker  . Smokeless tobacco: Never Used  . Alcohol  use No     Allergies   Penicillins; Statins; Sulfa drugs cross reactors; Tramadol hcl; Aspirin; Codeine; and Morphine and related   Review of Systems Review of Systems  HENT: Positive for nosebleeds.   All other systems reviewed and are negative.    Physical Exam Updated Vital Signs BP 169/86 (BP Location: Left Arm)   Pulse 83   Temp 97.5 F (36.4 C) (Oral)   Resp 16   SpO2 96%   Physical Exam  Constitutional: She appears well-developed and well-nourished.  HENT:  Head: Normocephalic and atraumatic.  Right Ear: External ear normal.  Left Ear: External ear normal.  Mouth/Throat: Oropharynx is clear and moist.  Dried blood in right nare.  No active bleeding.  Eyes: Conjunctivae and EOM are normal. Pupils are equal, round, and reactive to light.  Neck: Normal range of motion. Neck supple.  Cardiovascular: Normal rate, regular rhythm, normal heart sounds and intact distal pulses.   Pulmonary/Chest: Effort normal and breath sounds normal.  Abdominal: Soft. Bowel sounds are normal.  Musculoskeletal: Normal range of motion.  Neurological: She is alert.  Skin: Skin is warm.  Psychiatric: She has a normal mood and affect. Her behavior is normal. Judgment and thought content normal.  Nursing note and vitals reviewed.    ED Treatments / Results  Labs (all labs ordered are listed, but only abnormal results are displayed) Labs Reviewed - No data to display  EKG  EKG Interpretation None       Radiology No results found.  Procedures Procedures (including critical care time)  Medications Ordered in ED Medications  oxymetazoline (AFRIN) 0.05 % nasal spray 1 spray (1 spray Each Nare Given 07/22/16 1142)     Initial Impression / Assessment and Plan / ED Course  I have reviewed the triage vital signs and the nursing notes.  Pertinent labs & imaging results that were available during my care of the patient were reviewed by me and considered in my medical decision  making (see chart for details).    Pt able to ambulate without nose bleeding.  No further bleeding while here.  Pt is stable for d/c.  Final Clinical Impressions(s) / ED Diagnoses   Final diagnoses:  Epistaxis    New Prescriptions New Prescriptions   No medications on file     Kimberly Pence, MD 07/22/16 1203

## 2016-07-22 NOTE — ED Notes (Signed)
Pt ambulated over 30 feet with limited assist.  Pt used restroom independently and ambulated to room.  Maintained a steady gait.  No signs of respiratory distress.    Kimberly Acevedo

## 2016-07-22 NOTE — ED Notes (Signed)
Bed: [id number] Expected date:  Expected time:  Means of arrival:  Comments: Nosebleed

## 2016-07-22 NOTE — Discharge Instructions (Signed)
Afrin only if nose starts to bleed.  Max twice in 1 day for 3 days.  Saline spray twice a day to keep nose moist.

## 2016-07-22 NOTE — ED Triage Notes (Signed)
Per [facility] pt from home , epistaxis 1 hour ago, co dizziness. Per EMS pt is poor historian due to Hx dementia. Bleeding is controled upon arrival. Alert and oriented to self and situation , no to time. Hx HTN .

## 2016-07-29 ENCOUNTER — Encounter (HOSPITAL_COMMUNITY): Payer: Self-pay

## 2016-07-29 ENCOUNTER — Emergency Department (HOSPITAL_COMMUNITY)
Admission: EM | Admit: 2016-07-29 | Discharge: 2016-07-29 | Disposition: A | Discharge status: Home / Self Care | Payer: Medicare Other | Attending: Emergency Medicine | Admitting: Emergency Medicine

## 2016-07-29 DIAGNOSIS — I10 Essential (primary) hypertension: Secondary | ICD-10-CM | POA: Diagnosis not present

## 2016-07-29 DIAGNOSIS — Z853 Personal history of malignant neoplasm of breast: Secondary | ICD-10-CM | POA: Diagnosis not present

## 2016-07-29 DIAGNOSIS — R040 Epistaxis: Secondary | ICD-10-CM | POA: Diagnosis not present

## 2016-07-29 MED ORDER — SILVER NITRATE-POT NITRATE 75-25 % EX MISC
1.0000 "application " | Freq: Once | CUTANEOUS | Status: AC
  Administered 2016-07-29: 1 via TOPICAL
  Filled 2016-07-29: qty 1

## 2016-07-29 MED ORDER — LIDOCAINE VISCOUS 2 % MT SOLN
15.0000 mL | Freq: Once | OROMUCOSAL | Status: AC
  Administered 2016-07-29: 15 mL via OROMUCOSAL
  Filled 2016-07-29: qty 15

## 2016-07-29 MED ORDER — OXYMETAZOLINE HCL 0.05 % NA SOLN
1.0000 | Freq: Once | NASAL | Status: AC
  Administered 2016-07-29: 1 via NASAL
  Filled 2016-07-29: qty 15

## 2016-07-29 NOTE — ED Provider Notes (Signed)
Emergency Department Provider Note   I have reviewed the triage vital signs and the nursing notes.   HISTORY  Chief Complaint Epistaxis   HPI Kimberly Acevedo is a 83 y.o. female with PMH of dementia, HLD, and GERD since to the emergency department for evaluation of intermittent epistaxis. Patient had an episode of nosebleed yesterday that was difficult to stop. Eventually the head but the patient had 2 other episodes of dripping blood from the right nostril. They applied pressure and used Afrin which did not initially stop the symptoms. The nosebleed stopped spontaneously while driving to the emergency department. Patient is not on anticoagulation. No injury to the nose. They scheduled an appointment with ear nose and throat doctor but that is not for another 2 weeks, which was the earliest appointment.   Level 5 caveat: dementia   Past Medical History:  Diagnosis Date  . Anal fissure   . Arthritis   . Bilateral edema of lower extremity   . Cognitive dysfunction    DUE TO DEMENTIA--  PER NEUROLOGIST NOTE SEVERE  . Dementia with behavioral disturbance    PER NEUROLOGIST NOTE PT ABLE TO DO ADL'S BUT UNABLE TO CARE FOR HOME AND NEEDED AND PT HAS 24 HOUR CARE  . Dyslipidemia   . Fibromyalgia   . GERD (gastroesophageal reflux disease)   . Hemorrhoids   . History of breast cancer oncologist-  dr Humphrey Rolls--- no recurrence   dx 05-19-2012  Stage I , Invasive Lobular/ LCIS & microinvasive ductal and DCIS (T1 A NX M0)--  s/p  left partial mastectomy 07-04-2012 and radiation  . History of diverticulitis of colon   . History of radiation therapy 08-10-2012 to 09-08-2012   Left breast 750 cGy in 3 sessions  . History of squamous cell carcinoma excision    NOSE  . Hypertension   . OSA on CPAP   . Prolapsed internal hemorrhoids, grade 3   . Varicose veins     Patient Active Problem List   Diagnosis Date Noted  . Volume depletion   . GI bleed 11/20/2015  . Gastrointestinal hemorrhage  associated with intestinal diverticulitis   . Diverticulitis of colon   . Intra-abdominal hematoma 01/06/2015  . Normocytic normochromic anemia 01/06/2015  . Nausea vomiting and diarrhea 01/06/2015  . Bleeding internal hemorrhoids 07/16/2014  . Anal fissure - posterior 06/17/2014  . Dementia 06/15/2014  . Generalized abdominal pain   . Abdominal pain   . Pancreatitis 05/24/2014  . Leukocytosis 05/24/2014  . GERD (gastroesophageal reflux disease) 05/24/2014  . Dementia with behavioral disturbance 02/27/2014  . Rectal bleeding 05/21/2013  . Hypokalemia 05/21/2013  . Depression 05/21/2013  . Invasive lobular carcinoma of breast, stage 1 ([place]) 06/23/2012  . Hypertension, essential, benign   . Fibromyalgia   . Achalasia, esophageal     Past Surgical History:  Procedure Laterality Date  . BREAST LUMPECTOMY WITH NEEDLE LOCALIZATION  07/04/2012   Procedure: BREAST LUMPECTOMY WITH NEEDLE LOCALIZATION;  Surgeon: Edward Jolly, MD;  Location: [facility];  Service: General;  Laterality: Left;  left  . CHOLECYSTECTOMY  2007  . COLONOSCOPY    . ESOPHAGEAL DILATION    . HEMORRHOID BANDING  Mar & Apr 2016  . HEMORRHOID SURGERY N/A 12/25/2014   Procedure: HEMORRHOIDOPEXY WITH ANAL EXAM;  Surgeon: Leighton Ruff, MD;  Location: [facility];  Service: General;  Laterality: N/A;  . [place]    . TOTAL ABDOMINAL HYSTERECTOMY  1976  . VARICOSE VEIN  SURGERY  2010   left lower extremity    Current Outpatient Rx  . Order #: [id number] Class: Historical Med  . Order #: [id number] Class: Normal  . Order #: [id number] Class: Historical Med  . Order #: [id number] Class: Historical Med    Allergies Penicillins; Statins; Sulfa drugs cross reactors; Tramadol hcl; Aspirin; Codeine; and Morphine and related  Family History  Problem Relation Age of Onset  . Cancer Mother 72    melanoma ca  . Arthritis Father   . Dementia Neg Hx     Social History Social History   Substance Use Topics  . Smoking status: Never Smoker  . Smokeless tobacco: Never Used  . Alcohol use No    Review of Systems  Level 5 caveat: dementia  ____________________________________________   PHYSICAL EXAM:  VITAL SIGNS: ED Triage Vitals  Enc Vitals Group     BP 07/29/16 1847 129/65     Pulse Rate 07/29/16 1847 95     Resp 07/29/16 1847 20     Temp 07/29/16 1847 98.5 F (36.9 C)     Temp Source 07/29/16 1847 Oral     SpO2 07/29/16 1847 95 %     Pain Score 07/29/16 1855 0   Constitutional: Alert and oriented. Well appearing and in no acute distress. Eyes: Conjunctivae are normal.  Head: Atraumatic. Ears:  Healthy appearing ear canals and TMs bilaterally Nose: No congestion/rhinnorhea. No active bleeding. Punctate scabbed area along the medial, anterior right nostril.  Mouth/Throat: Mucous membranes are moist.  Oropharynx non-erythematous. Neck: No stridor.   Cardiovascular: Normal rate, regular rhythm. Good peripheral circulation. Grossly normal heart sounds.   Respiratory: Normal respiratory effort.  No retractions. Lungs CTAB. Gastrointestinal: Soft and nontender. No distention.  Musculoskeletal: No lower extremity tenderness nor edema. No gross deformities of extremities. Neurologic:  Normal speech and language. No gross focal neurologic deficits are appreciated.  Skin:  Skin is warm, dry and intact. No rash noted.  ____________________________________________  [id number]  None ____________________________________________   PROCEDURES  Procedure(s) performed:   .Epistaxis Management Date/Time: 07/29/2016 8:36 PM Performed by: Margette Fast Authorized by: Margette Fast   Consent:    Consent obtained:  Verbal   Consent given by:  Patient and healthcare agent   Risks discussed:  Bleeding, infection, nasal injury and pain   Alternatives discussed:  Alternative treatment and observation Anesthesia (see MAR for exact dosages):    Anesthesia method:   Topical application   Topical anesthetic:  Lidocaine gel Procedure details:    Treatment site:  R anterior   Treatment method:  Silver nitrate   Treatment complexity:  Limited   Treatment episode: initial   Post-procedure details:    Assessment:  Bleeding stopped   Patient tolerance of procedure:  Tolerated well, no immediate complications  ____________________________________________   INITIAL IMPRESSION / ASSESSMENT AND PLAN / ED COURSE  Pertinent labs & imaging results that were available during my care of the patient were reviewed by me and considered in my medical decision making (see chart for details).  Patient resents to the emergency department with intermittent epistaxis. She has a punctate area on the medial aspect of her right anterior nostril. No surrounding erythema or edema. No area of active bleeding. There is a small scab over this area. I suspect this is the site of her intermittent nosebleeds. I provided topical pain medication and silver nitrate to the area with good blanching.  Observed for 30 minutes with no bleeding. Discussed using a small amount of  vaseline in the area for barrier protection. The remainder of the nasal mucosa is unremarkable. Patient has ENT follow up scheduled in the coming 1-2 weeks.    At this time, I do not feel there is any life-threatening condition present. I have reviewed and discussed all results (EKG, imaging, lab, urine as appropriate), exam findings with patient. I have reviewed nursing notes and appropriate previous records.  I feel the patient is safe to be discharged home without further emergent workup. Discussed usual and customary return precautions. Patient and family (if present) verbalize understanding and are comfortable with this plan.  Patient will follow-up with their primary care provider. If they do not have a primary care provider, information for follow-up has been provided to them. All questions have been  answered.  ____________________________________________  FINAL CLINICAL IMPRESSION(S) / ED DIAGNOSES  Final diagnoses:  Epistaxis     MEDICATIONS GIVEN DURING THIS VISIT:  Medications  oxymetazoline (AFRIN) 0.05 % nasal spray 1 spray (1 spray Each Nare Provided for home use 07/29/16 2052)  lidocaine (XYLOCAINE) 2 % viscous mouth solution 15 mL (15 mLs Mouth/Throat Given by Other 07/29/16 2052)  silver nitrate applicators applicator 1 application (1 application Topical Given by Other 07/29/16 2052)     NEW OUTPATIENT MEDICATIONS STARTED DURING THIS VISIT:  None   Note:  This document was prepared using Dragon voice recognition software and may include unintentional dictation errors.  Nanda Quinton, MD Emergency Medicine   Margette Fast, MD 07/30/16 [phone]

## 2016-07-29 NOTE — ED Triage Notes (Signed)
Pt BIB on of her 24 hr care providers. She is complaining of episodes of epistaxis from her R nostril starting today. Not bleeding in triage, but visitor states that it happens off and on. Sneezing causes it to start again. Hx of dementia- poor historian. A&Ox3.

## 2016-07-29 NOTE — Discharge Instructions (Signed)

## 2016-09-18 ENCOUNTER — Emergency Department (HOSPITAL_COMMUNITY): Payer: Medicare Other

## 2016-09-18 ENCOUNTER — Emergency Department (HOSPITAL_COMMUNITY)
Admission: EM | Admit: 2016-09-18 | Discharge: 2016-09-18 | Disposition: A | Discharge status: Home / Self Care | Payer: Medicare Other | Attending: Emergency Medicine | Admitting: Emergency Medicine

## 2016-09-18 ENCOUNTER — Encounter (HOSPITAL_COMMUNITY): Payer: Self-pay | Admitting: Emergency Medicine

## 2016-09-18 DIAGNOSIS — I10 Essential (primary) hypertension: Secondary | ICD-10-CM | POA: Diagnosis not present

## 2016-09-18 DIAGNOSIS — S42351A Displaced comminuted fracture of shaft of humerus, right arm, initial encounter for closed fracture: Secondary | ICD-10-CM | POA: Insufficient documentation

## 2016-09-18 DIAGNOSIS — Y939 Activity, unspecified: Secondary | ICD-10-CM | POA: Insufficient documentation

## 2016-09-18 DIAGNOSIS — S42201A Unspecified fracture of upper end of right humerus, initial encounter for closed fracture: Secondary | ICD-10-CM | POA: Diagnosis not present

## 2016-09-18 DIAGNOSIS — S42291A Other displaced fracture of upper end of right humerus, initial encounter for closed fracture: Secondary | ICD-10-CM | POA: Insufficient documentation

## 2016-09-18 DIAGNOSIS — Y999 Unspecified external cause status: Secondary | ICD-10-CM | POA: Insufficient documentation

## 2016-09-18 DIAGNOSIS — Z853 Personal history of malignant neoplasm of breast: Secondary | ICD-10-CM | POA: Diagnosis not present

## 2016-09-18 DIAGNOSIS — Y929 Unspecified place or not applicable: Secondary | ICD-10-CM | POA: Insufficient documentation

## 2016-09-18 DIAGNOSIS — Z79899 Other long term (current) drug therapy: Secondary | ICD-10-CM | POA: Diagnosis not present

## 2016-09-18 DIAGNOSIS — W010XXA Fall on same level from slipping, tripping and stumbling without subsequent striking against object, initial encounter: Secondary | ICD-10-CM | POA: Diagnosis not present

## 2016-09-18 DIAGNOSIS — S42211D Unspecified displaced fracture of surgical neck of right humerus, subsequent encounter for fracture with routine healing: Secondary | ICD-10-CM | POA: Diagnosis not present

## 2016-09-18 DIAGNOSIS — S4991XA Unspecified injury of right shoulder and upper arm, initial encounter: Secondary | ICD-10-CM | POA: Diagnosis present

## 2016-09-18 MED ORDER — OXYCODONE-ACETAMINOPHEN 5-325 MG PO TABS: 1.0000 | ORAL_TABLET | Freq: Three times a day (TID) | ORAL | 0 refills | Status: DC | PRN

## 2016-09-18 MED ORDER — OXYCODONE-ACETAMINOPHEN 5-325 MG PO TABS
1.0000 | ORAL_TABLET | Freq: Once | ORAL | Status: AC
  Administered 2016-09-18: 1 via ORAL
  Filled 2016-09-18: qty 1

## 2016-09-18 NOTE — ED Notes (Signed)
Pt c/o 10/10 right shoulder pain after a fall. Pt is now unable to lift the right arm. Pt took some aleve around 5:30 and it helped the pain a little.

## 2016-09-18 NOTE — ED Triage Notes (Signed)
Pt states she stepped in a hole and fell this evening around 1745  Pt states she has had right shoulder pain since  Pain increases upon movement

## 2016-09-18 NOTE — ED Provider Notes (Signed)
[facility] DEPT Provider Note   CSN: [id number] Arrival date & time: 09/18/16  1937     History   Chief Complaint Chief Complaint  Patient presents with  . Fall  . Shoulder Pain    HPI Kimberly Acevedo is a 83 y.o. female.   Shoulder Injury  This is a new problem. The current episode started less than 1 hour ago. The problem occurs constantly. The problem has not changed since onset.Pertinent negatives include no chest pain. Nothing aggravates the symptoms. Nothing relieves the symptoms. She has tried nothing for the symptoms.    Past Medical History:  Diagnosis Date  . Anal fissure   . Arthritis   . Bilateral edema of lower extremity   . Cognitive dysfunction    DUE TO DEMENTIA--  PER NEUROLOGIST NOTE SEVERE  . Dementia with behavioral disturbance    PER NEUROLOGIST NOTE PT ABLE TO DO ADL'S BUT UNABLE TO CARE FOR HOME AND NEEDED AND PT HAS 24 HOUR CARE  . Dyslipidemia   . Fibromyalgia   . GERD (gastroesophageal reflux disease)   . Hemorrhoids   . History of breast cancer oncologist-  dr Humphrey Rolls--- no recurrence   dx 05-19-2012  Stage I , Invasive Lobular/ LCIS & microinvasive ductal and DCIS (T1 A NX M0)--  s/p  left partial mastectomy 07-04-2012 and radiation  . History of diverticulitis of colon   . History of radiation therapy 08-10-2012 to 09-08-2012   Left breast 750 cGy in 3 sessions  . History of squamous cell carcinoma excision    NOSE  . Hypertension   . OSA on CPAP   . Prolapsed internal hemorrhoids, grade 3   . Varicose veins     Patient Active Problem List   Diagnosis Date Noted  . Volume depletion   . GI bleed 11/20/2015  . Gastrointestinal hemorrhage associated with intestinal diverticulitis   . Diverticulitis of colon   . Intra-abdominal hematoma 01/06/2015  . Normocytic normochromic anemia 01/06/2015  . Nausea vomiting and diarrhea 01/06/2015  . Bleeding internal hemorrhoids 07/16/2014  . Anal fissure - posterior 06/17/2014  . Dementia  06/15/2014  . Generalized abdominal pain   . Abdominal pain   . Pancreatitis 05/24/2014  . Leukocytosis 05/24/2014  . GERD (gastroesophageal reflux disease) 05/24/2014  . Dementia with behavioral disturbance 02/27/2014  . Rectal bleeding 05/21/2013  . Hypokalemia 05/21/2013  . Depression 05/21/2013  . Invasive lobular carcinoma of breast, stage 1 ([place]) 06/23/2012  . Hypertension, essential, benign   . Fibromyalgia   . Achalasia, esophageal     Past Surgical History:  Procedure Laterality Date  . BREAST LUMPECTOMY WITH NEEDLE LOCALIZATION  07/04/2012   Procedure: BREAST LUMPECTOMY WITH NEEDLE LOCALIZATION;  Surgeon: Edward Jolly, MD;  Location: [facility];  Service: General;  Laterality: Left;  left  . CHOLECYSTECTOMY  2007  . COLONOSCOPY    . ESOPHAGEAL DILATION    . HEMORRHOID BANDING  Mar & Apr 2016  . HEMORRHOID SURGERY N/A 12/25/2014   Procedure: HEMORRHOIDOPEXY WITH ANAL EXAM;  Surgeon: Leighton Ruff, MD;  Location: [facility];  Service: General;  Laterality: N/A;  . [place]    . TOTAL ABDOMINAL HYSTERECTOMY  1976  . VARICOSE VEIN SURGERY  2010   left lower extremity    OB History    No data available       Home Medications    Prior to Admission medications   Medication Sig Start Date End Date Taking? Authorizing  Provider  Cholecalciferol (VITAMIN D) 2000 UNITS CAPS Take 1 capsule by mouth daily.   Yes Historical Provider, MD  losartan (COZAAR) 100 MG tablet Take 1 tablet (100 mg total) by mouth daily. 07/20/16  Yes [name], MD  naproxen sodium (ANAPROX) 220 MG tablet Take 440 mg by mouth 2 (two) times daily as needed (pain).   Yes Historical Provider, MD  sertraline (ZOLOFT) 100 MG tablet Take 100 mg by mouth daily. 07/13/16  Yes Historical Provider, MD  vitamin B-12 (CYANOCOBALAMIN) 1000 MCG tablet Take 1,000 mcg by mouth daily.   Yes Historical Provider, MD  oxyCODONE-acetaminophen (PERCOCET) 5-325 MG tablet Take  1-2 tablets by mouth every 8 (eight) hours as needed for severe pain. 09/18/16   Merrily Pew, MD    Family History Family History  Problem Relation Age of Onset  . Cancer Mother 46    melanoma ca  . Arthritis Father   . Dementia Neg Hx     Social History Social History  Substance Use Topics  . Smoking status: Never Smoker  . Smokeless tobacco: Never Used  . Alcohol use No     Allergies   Penicillins; Statins; Sulfa drugs cross reactors; Tramadol hcl; Aspirin; Codeine; and Morphine and related   Review of Systems Review of Systems  Cardiovascular: Negative for chest pain.  All other systems reviewed and are negative.    Physical Exam Updated Vital Signs BP (!) 165/68 (BP Location: Left Arm)   Pulse 72   Temp 98.6 F (37 C) (Oral)   Resp 14   Ht 5\' 8"  (1.727 m)   Wt 177 lb (80.3 kg)   SpO2 98%   BMI 26.91 kg/m   Physical Exam  Constitutional: She appears well-developed and well-nourished.  HENT:  Head: Normocephalic and atraumatic.  Eyes: Conjunctivae and EOM are normal.  Neck: Normal range of motion.  Cardiovascular: Normal rate and regular rhythm.   Pulmonary/Chest: Effort normal and breath sounds normal. No stridor. No respiratory distress.  Abdominal: Soft. Bowel sounds are normal. She exhibits no distension.  Musculoskeletal: She exhibits tenderness (right proximal humerus, worse with ROM). She exhibits no edema or deformity.  Neurological: She is alert.  Skin: Skin is warm and dry.  Nursing note and vitals reviewed.    ED Treatments / Results  Labs (all labs ordered are listed, but only abnormal results are displayed) Labs Reviewed - No data to display  EKG  EKG Interpretation None       Radiology Dg Shoulder Right  Result Date: 09/18/2016 CLINICAL DATA:  Status post fall, tripping on hole in ground. Right shoulder pain. Initial encounter. EXAM: RIGHT SHOULDER - 2+ VIEW COMPARISON:  None. FINDINGS: There is a comminuted fracture  involving the right humeral head and neck, with shortening at the fracture site. An underlying right glenohumeral joint effusion is noted. Degenerative change is noted at the right acromioclavicular joint. Overlying soft tissue swelling is noted. The visualized portions of the right lung are clear. IMPRESSION: Comminuted fracture involving the right humeral head and neck, with shortening at the fracture site. Underlying right glenohumeral joint effusion noted. Electronically Signed   By: Garald Balding M.D.   On: 09/18/2016 21:14    Procedures Procedures (including critical care time)  Medications Ordered in ED Medications  oxyCODONE-acetaminophen (PERCOCET/ROXICET) 5-325 MG per tablet 1 tablet (1 tablet Oral Given 09/18/16 2130)     Initial Impression / Assessment and Plan / ED Course  I have reviewed the triage vital signs and  the nursing notes.  Pertinent labs & imaging results that were available during my care of the patient were reviewed by me and considered in my medical decision making (see chart for details).     Humeral neck/head fracture. NVI distally. Pain controlled. Will dc with orthopedic follow up.   Final Clinical Impressions(s) / ED Diagnoses   Final diagnoses:  Closed fracture of proximal end of right humerus, unspecified fracture morphology, initial encounter    New Prescriptions Discharge Medication List as of 09/18/2016 10:01 PM    START taking these medications   Details  oxyCODONE-acetaminophen (PERCOCET) 5-325 MG tablet Take 1-2 tablets by mouth every 8 (eight) hours as needed for severe pain., Starting Sat 09/18/2016, Print         Merrily Pew, MD 09/19/16 [phone]

## 2016-09-21 ENCOUNTER — Ambulatory Visit (INDEPENDENT_AMBULATORY_CARE_PROVIDER_SITE_OTHER): Payer: Medicare Other | Admitting: Orthopaedic Surgery

## 2016-09-21 ENCOUNTER — Ambulatory Visit (INDEPENDENT_AMBULATORY_CARE_PROVIDER_SITE_OTHER): Payer: Medicare Other | Admitting: Specialist

## 2016-09-21 DIAGNOSIS — S42291A Other displaced fracture of upper end of right humerus, initial encounter for closed fracture: Secondary | ICD-10-CM | POA: Diagnosis not present

## 2016-09-21 NOTE — Progress Notes (Signed)
Office Visit Note   Patient: Kimberly Acevedo           Date of Birth: 1929/05/29           MRN: [id number] Visit Date: 09/21/2016              Requested by: Deland Pretty, MD [address] [place], [place] [address] PCP: [name], MD   Assessment & Plan: Visit Diagnoses:  1. Closed 3-part fracture of proximal end of right humerus, initial encounter     Plan: This is quite a significant fracture of her right shoulder and will likely cause significant disability. However given her dementia and her age as well as comorbidities I would treat this nonoperative with a sling, gravity and time. Her caregiver agrees with this as well. We'll see her back in 3 weeks with internal/external rotated views of her right shoulder.  Follow-Up Instructions: Return in about 3 weeks (around 10/12/2016).   Orders:  No orders of the defined types were placed in this encounter.  No orders of the defined types were placed in this encounter.     Procedures: No procedures performed   Clinical Data: No additional findings.   Subjective: No chief complaint on file. The patient is a pleasant 82 year old with dementia who sustained a mechanical fall on 09/18/2016 in her yard. This is an accidental fall. She is a caregiver with her today. She was seen at the [facility] emergency room and found to have a proximal humerus fracture and placement sling and given follow-up our office. Her caregiver says that the pain medication is too strong and making the patient more lightheaded and "talking out of her head".  HPI  Review of Systems She denies any other injuries. She denies any recent illnesses including fever, chills, nausea, vomiting. She denies any headache, chest pain, shortness of breath.  Objective: Vital Signs: There were no vitals taken for this visit.  Physical Exam She is alert and oriented 3 and in no acute distress Ortho Exam Examination of her right shoulder  shows that it is clinically well located but they're significant bruising. Her her right elbow wrist and hand awl have normal function. Specialty Comments:  No specialty comments available.  Imaging: No results found. X-rays on the cone system/canopy system independently reviewed by me show a complex proximal humerus fracture that it's at least 3-4 part fracture. There is some subluxation of this as well.  PMFS History: Patient Active Problem List   Diagnosis Date Noted  . Volume depletion   . GI bleed 11/20/2015  . Gastrointestinal hemorrhage associated with intestinal diverticulitis   . Diverticulitis of colon   . Intra-abdominal hematoma 01/06/2015  . Normocytic normochromic anemia 01/06/2015  . Nausea vomiting and diarrhea 01/06/2015  . Bleeding internal hemorrhoids 07/16/2014  . Anal fissure - posterior 06/17/2014  . Dementia 06/15/2014  . Generalized abdominal pain   . Abdominal pain   . Pancreatitis 05/24/2014  . Leukocytosis 05/24/2014  . GERD (gastroesophageal reflux disease) 05/24/2014  . Dementia with behavioral disturbance 02/27/2014  . Rectal bleeding 05/21/2013  . Hypokalemia 05/21/2013  . Depression 05/21/2013  . Invasive lobular carcinoma of breast, stage 1 ([place]) 06/23/2012  . Hypertension, essential, benign   . Fibromyalgia   . Achalasia, esophageal    Past Medical History:  Diagnosis Date  . Anal fissure   . Arthritis   . Bilateral edema of lower extremity   . Cognitive dysfunction    DUE TO DEMENTIA--  PER NEUROLOGIST NOTE SEVERE  . Dementia with behavioral disturbance    PER NEUROLOGIST NOTE PT ABLE TO DO ADL'S BUT UNABLE TO CARE FOR HOME AND NEEDED AND PT HAS 24 HOUR CARE  . Dyslipidemia   . Fibromyalgia   . GERD (gastroesophageal reflux disease)   . Hemorrhoids   . History of breast cancer oncologist-  dr Humphrey Rolls--- no recurrence   dx 05-19-2012  Stage I , Invasive Lobular/ LCIS & microinvasive ductal and DCIS (T1 A NX M0)--  s/p  left partial  mastectomy 07-04-2012 and radiation  . History of diverticulitis of colon   . History of radiation therapy 08-10-2012 to 09-08-2012   Left breast 750 cGy in 3 sessions  . History of squamous cell carcinoma excision    NOSE  . Hypertension   . OSA on CPAP   . Prolapsed internal hemorrhoids, grade 3   . Varicose veins     Family History  Problem Relation Age of Onset  . Cancer Mother 61    melanoma ca  . Arthritis Father   . Dementia Neg Hx     Past Surgical History:  Procedure Laterality Date  . BREAST LUMPECTOMY WITH NEEDLE LOCALIZATION  07/04/2012   Procedure: BREAST LUMPECTOMY WITH NEEDLE LOCALIZATION;  Surgeon: Edward Jolly, MD;  Location: [facility];  Service: General;  Laterality: Left;  left  . CHOLECYSTECTOMY  2007  . COLONOSCOPY    . ESOPHAGEAL DILATION    . HEMORRHOID BANDING  Mar & Apr 2016  . HEMORRHOID SURGERY N/A 12/25/2014   Procedure: HEMORRHOIDOPEXY WITH ANAL EXAM;  Surgeon: Leighton Ruff, MD;  Location: [facility];  Service: General;  Laterality: N/A;  . [place]    . TOTAL ABDOMINAL HYSTERECTOMY  1976  . VARICOSE VEIN SURGERY  2010   left lower extremity   Social History   Occupational History  . interior design   . Retired     Social History Main Topics  . Smoking status: Never Smoker  . Smokeless tobacco: Never Used  . Alcohol use No  . Drug use: No  . Sexual activity: Not on file

## 2016-10-13 ENCOUNTER — Ambulatory Visit (INDEPENDENT_AMBULATORY_CARE_PROVIDER_SITE_OTHER): Payer: Medicare Other | Admitting: Orthopaedic Surgery

## 2016-10-13 ENCOUNTER — Encounter (INDEPENDENT_AMBULATORY_CARE_PROVIDER_SITE_OTHER): Payer: Self-pay | Admitting: Orthopaedic Surgery

## 2016-10-13 ENCOUNTER — Ambulatory Visit (INDEPENDENT_AMBULATORY_CARE_PROVIDER_SITE_OTHER): Payer: Medicare Other

## 2016-10-13 VITALS — Ht 68.0 in | Wt 177.0 lb

## 2016-10-13 DIAGNOSIS — S42291D Other displaced fracture of upper end of right humerus, subsequent encounter for fracture with routine healing: Secondary | ICD-10-CM

## 2016-10-13 NOTE — Progress Notes (Signed)
Patient is a show with mild dementia. She has severe peripheral part proximal humerus fracture of her right shoulder. Her try to treat this nonoperative. She still having considerable amount of pain. She is 3 weeks since her fall.  The bruising is minimal now of her right shoulder. It appears to move as a unit and is located. She still has significant pain though.  X-rays reviewed of her shoulder including internal/external rotated views of the right shoulder show a complex 4 part proximal humerus fracture with slight inferior subluxation.  This is something that still treat nonoperatively given her other medical issues. We'll try some Tylenol No. 3 for pain. She'll still continue the sling. I'll see her back in 3 weeks with just an AP single view of the right shoulder.

## 2016-10-18 ENCOUNTER — Ambulatory Visit: Payer: Medicare Other | Admitting: Family Medicine

## 2016-10-30 ENCOUNTER — Inpatient Hospital Stay (HOSPITAL_COMMUNITY): Payer: Medicare Other

## 2016-10-30 ENCOUNTER — Encounter (HOSPITAL_COMMUNITY): Payer: Self-pay

## 2016-10-30 ENCOUNTER — Emergency Department (HOSPITAL_COMMUNITY): Payer: Medicare Other

## 2016-10-30 ENCOUNTER — Inpatient Hospital Stay (HOSPITAL_COMMUNITY)
Admission: EM | Admit: 2016-10-30 | Discharge: 2016-11-02 | DRG: 481 | Disposition: A | Discharge status: Skilled Nursing Facility | Payer: Medicare Other | Attending: Internal Medicine | Admitting: Internal Medicine

## 2016-10-30 DIAGNOSIS — R739 Hyperglycemia, unspecified: Secondary | ICD-10-CM | POA: Diagnosis present

## 2016-10-30 DIAGNOSIS — Z882 Allergy status to sulfonamides status: Secondary | ICD-10-CM | POA: Diagnosis not present

## 2016-10-30 DIAGNOSIS — Z880 Allergy status to penicillin: Secondary | ICD-10-CM

## 2016-10-30 DIAGNOSIS — R1311 Dysphagia, oral phase: Secondary | ICD-10-CM | POA: Diagnosis not present

## 2016-10-30 DIAGNOSIS — R278 Other lack of coordination: Secondary | ICD-10-CM | POA: Diagnosis not present

## 2016-10-30 DIAGNOSIS — Z885 Allergy status to narcotic agent status: Secondary | ICD-10-CM | POA: Diagnosis not present

## 2016-10-30 DIAGNOSIS — S72141A Displaced intertrochanteric fracture of right femur, initial encounter for closed fracture: Secondary | ICD-10-CM | POA: Diagnosis not present

## 2016-10-30 DIAGNOSIS — Z853 Personal history of malignant neoplasm of breast: Secondary | ICD-10-CM | POA: Diagnosis not present

## 2016-10-30 DIAGNOSIS — M6281 Muscle weakness (generalized): Secondary | ICD-10-CM | POA: Diagnosis not present

## 2016-10-30 DIAGNOSIS — Z01818 Encounter for other preprocedural examination: Secondary | ICD-10-CM | POA: Diagnosis not present

## 2016-10-30 DIAGNOSIS — W010XXA Fall on same level from slipping, tripping and stumbling without subsequent striking against object, initial encounter: Secondary | ICD-10-CM | POA: Diagnosis present

## 2016-10-30 DIAGNOSIS — Z886 Allergy status to analgesic agent status: Secondary | ICD-10-CM

## 2016-10-30 DIAGNOSIS — Z923 Personal history of irradiation: Secondary | ICD-10-CM | POA: Diagnosis not present

## 2016-10-30 DIAGNOSIS — S72002A Fracture of unspecified part of neck of left femur, initial encounter for closed fracture: Secondary | ICD-10-CM | POA: Diagnosis present

## 2016-10-30 DIAGNOSIS — Z4889 Encounter for other specified surgical aftercare: Secondary | ICD-10-CM | POA: Diagnosis not present

## 2016-10-30 DIAGNOSIS — Y92009 Unspecified place in unspecified non-institutional (private) residence as the place of occurrence of the external cause: Secondary | ICD-10-CM

## 2016-10-30 DIAGNOSIS — Z808 Family history of malignant neoplasm of other organs or systems: Secondary | ICD-10-CM | POA: Diagnosis not present

## 2016-10-30 DIAGNOSIS — Z9989 Dependence on other enabling machines and devices: Secondary | ICD-10-CM | POA: Diagnosis not present

## 2016-10-30 DIAGNOSIS — Z66 Do not resuscitate: Secondary | ICD-10-CM | POA: Diagnosis not present

## 2016-10-30 DIAGNOSIS — R9389 Abnormal findings on diagnostic imaging of other specified body structures: Secondary | ICD-10-CM

## 2016-10-30 DIAGNOSIS — M797 Fibromyalgia: Secondary | ICD-10-CM | POA: Diagnosis present

## 2016-10-30 DIAGNOSIS — F0151 Vascular dementia with behavioral disturbance: Secondary | ICD-10-CM | POA: Diagnosis not present

## 2016-10-30 DIAGNOSIS — S92153A Displaced avulsion fracture (chip fracture) of unspecified talus, initial encounter for closed fracture: Secondary | ICD-10-CM | POA: Diagnosis not present

## 2016-10-30 DIAGNOSIS — S72041A Displaced fracture of base of neck of right femur, initial encounter for closed fracture: Secondary | ICD-10-CM | POA: Diagnosis not present

## 2016-10-30 DIAGNOSIS — Z888 Allergy status to other drugs, medicaments and biological substances status: Secondary | ICD-10-CM

## 2016-10-30 DIAGNOSIS — I10 Essential (primary) hypertension: Secondary | ICD-10-CM | POA: Diagnosis present

## 2016-10-30 DIAGNOSIS — S0990XA Unspecified injury of head, initial encounter: Secondary | ICD-10-CM | POA: Diagnosis not present

## 2016-10-30 DIAGNOSIS — M1990 Unspecified osteoarthritis, unspecified site: Secondary | ICD-10-CM | POA: Diagnosis present

## 2016-10-30 DIAGNOSIS — Z9049 Acquired absence of other specified parts of digestive tract: Secondary | ICD-10-CM

## 2016-10-30 DIAGNOSIS — E785 Hyperlipidemia, unspecified: Secondary | ICD-10-CM | POA: Diagnosis not present

## 2016-10-30 DIAGNOSIS — C50912 Malignant neoplasm of unspecified site of left female breast: Secondary | ICD-10-CM | POA: Diagnosis not present

## 2016-10-30 DIAGNOSIS — M25511 Pain in right shoulder: Secondary | ICD-10-CM | POA: Diagnosis not present

## 2016-10-30 DIAGNOSIS — F0391 Unspecified dementia with behavioral disturbance: Secondary | ICD-10-CM

## 2016-10-30 DIAGNOSIS — S52131A Displaced fracture of neck of right radius, initial encounter for closed fracture: Secondary | ICD-10-CM | POA: Diagnosis not present

## 2016-10-30 DIAGNOSIS — R9431 Abnormal electrocardiogram [ECG] [EKG]: Secondary | ICD-10-CM | POA: Diagnosis not present

## 2016-10-30 DIAGNOSIS — W19XXXA Unspecified fall, initial encounter: Secondary | ICD-10-CM

## 2016-10-30 DIAGNOSIS — G4733 Obstructive sleep apnea (adult) (pediatric): Secondary | ICD-10-CM | POA: Diagnosis not present

## 2016-10-30 DIAGNOSIS — F419 Anxiety disorder, unspecified: Secondary | ICD-10-CM | POA: Diagnosis present

## 2016-10-30 DIAGNOSIS — K219 Gastro-esophageal reflux disease without esophagitis: Secondary | ICD-10-CM | POA: Diagnosis present

## 2016-10-30 DIAGNOSIS — M25551 Pain in right hip: Secondary | ICD-10-CM | POA: Diagnosis not present

## 2016-10-30 DIAGNOSIS — S72001A Fracture of unspecified part of neck of right femur, initial encounter for closed fracture: Secondary | ICD-10-CM | POA: Diagnosis not present

## 2016-10-30 DIAGNOSIS — Z9012 Acquired absence of left breast and nipple: Secondary | ICD-10-CM | POA: Diagnosis not present

## 2016-10-30 DIAGNOSIS — T148XXA Other injury of unspecified body region, initial encounter: Secondary | ICD-10-CM | POA: Diagnosis not present

## 2016-10-30 DIAGNOSIS — L8990 Pressure ulcer of unspecified site, unspecified stage: Secondary | ICD-10-CM | POA: Insufficient documentation

## 2016-10-30 DIAGNOSIS — Z90710 Acquired absence of both cervix and uterus: Secondary | ICD-10-CM

## 2016-10-30 DIAGNOSIS — R41841 Cognitive communication deficit: Secondary | ICD-10-CM | POA: Diagnosis not present

## 2016-10-30 DIAGNOSIS — Z79899 Other long term (current) drug therapy: Secondary | ICD-10-CM | POA: Diagnosis not present

## 2016-10-30 DIAGNOSIS — Z8261 Family history of arthritis: Secondary | ICD-10-CM | POA: Diagnosis not present

## 2016-10-30 DIAGNOSIS — R2681 Unsteadiness on feet: Secondary | ICD-10-CM | POA: Diagnosis not present

## 2016-10-30 DIAGNOSIS — S79911A Unspecified injury of right hip, initial encounter: Secondary | ICD-10-CM | POA: Diagnosis not present

## 2016-10-30 DIAGNOSIS — F03918 Unspecified dementia, unspecified severity, with other behavioral disturbance: Secondary | ICD-10-CM | POA: Diagnosis present

## 2016-10-30 LAB — CBC
HCT: 44.8 % (ref 36.0–46.0)
HEMOGLOBIN: 15.4 g/dL — AB (ref 12.0–15.0)
MCH: 31.4 pg (ref 26.0–34.0)
MCHC: 34.4 g/dL (ref 30.0–36.0)
MCV: 91.4 fL (ref 78.0–100.0)
Platelets: 228 10*3/uL (ref 150–400)
RBC: 4.9 MIL/uL (ref 3.87–5.11)
RDW: 12.8 % (ref 11.5–15.5)
WBC: 10.9 10*3/uL — AB (ref 4.0–10.5)

## 2016-10-30 LAB — COMPREHENSIVE METABOLIC PANEL
ALK PHOS: 100 U/L (ref 38–126)
ALT: 19 U/L (ref 14–54)
ANION GAP: 12 (ref 5–15)
AST: 27 U/L (ref 15–41)
Albumin: 3.9 g/dL (ref 3.5–5.0)
BILIRUBIN TOTAL: 0.6 mg/dL (ref 0.3–1.2)
BUN: 12 mg/dL (ref 6–20)
CALCIUM: 9.4 mg/dL (ref 8.9–10.3)
CO2: 23 mmol/L (ref 22–32)
Chloride: 105 mmol/L (ref 101–111)
Creatinine, Ser: 0.95 mg/dL (ref 0.44–1.00)
GFR calc Af Amer: >60 mL/min (ref >60)
GFR, EST NON AFRICAN AMERICAN: 53 mL/min — AB (ref >60)
Glucose, Bld: 163 mg/dL — ABNORMAL HIGH (ref 65–99)
Potassium: 3.6 mmol/L (ref 3.5–5.1)
Sodium: 140 mmol/L (ref 135–145)
TOTAL PROTEIN: 7.5 g/dL (ref 6.5–8.1)

## 2016-10-30 LAB — LIPASE, BLOOD: Lipase: 23 U/L (ref 11–51)

## 2016-10-30 LAB — PROTIME-INR
INR: 1.01
PROTHROMBIN TIME: 13.4 s (ref 11.4–15.2)

## 2016-10-30 MED ORDER — FENTANYL CITRATE (PF) 100 MCG/2ML IJ SOLN
50.0000 ug | Freq: Once | INTRAMUSCULAR | Status: AC
Start: 2016-10-30 — End: 2016-10-30
  Administered 2016-10-30: 50 ug via INTRAVENOUS
  Filled 2016-10-30: qty 2

## 2016-10-30 MED ORDER — ONDANSETRON HCL 4 MG/2ML IJ SOLN
4.0000 mg | Freq: Once | INTRAMUSCULAR | Status: AC
  Administered 2016-10-30: 4 mg via INTRAVENOUS
  Filled 2016-10-30: qty 2

## 2016-10-30 MED ORDER — ONDANSETRON HCL 4 MG/2ML IJ SOLN
4.0000 mg | Freq: Once | INTRAMUSCULAR | Status: AC | PRN
  Administered 2016-10-30: 4 mg via INTRAVENOUS
  Filled 2016-10-30: qty 2

## 2016-10-30 NOTE — ED Notes (Signed)
Pt in CT.

## 2016-10-30 NOTE — ED Notes (Signed)
Bed: EK06 Expected date:  Expected time:  Means of arrival:  Comments: 80f fall

## 2016-10-30 NOTE — ED Triage Notes (Addendum)
Pt BIB [facility]. She slipped and fell and is complaining of R hip and shoulder pain. Pt given 150 of fentanyl en route. EMS reports 3 episodes of emesis en route. Denies hitting head. Denies LOC. Denies back pain. R leg noticeable shorter. Pt able to move R toes.

## 2016-10-30 NOTE — ED Provider Notes (Signed)
[facility] DEPT Provider Note   CSN: [id number] Arrival date & time: 10/30/16  2041     History   Chief Complaint Chief Complaint  Patient presents with  . Fall    HPI Kimberly Acevedo is a 83 y.o. female.  The history is provided by the patient.  Fall    Remainder of history, ROS, and physical exam limited due to patient's condition (Dementia). Additional information was obtained from EMS and family.   Level V Caveat.  Per caretaker patient had a witnessed fall at home. She reports that she was using her wheeled [place] when it started moving on her and the patient started leaning towards the right. Caregiver tried to make it to the patient before she fell however patient fell onto her right hip. There is no visible head trauma. No loss of consciousness. Patient is not on any blood thinners. Patient is complaining only of right leg pain.  However patient was placed in a collar by EMS. Vomitus was noted on the collar.  Past Medical History:  Diagnosis Date  . Anal fissure   . Arthritis   . Bilateral edema of lower extremity   . Cognitive dysfunction    DUE TO DEMENTIA--  PER NEUROLOGIST NOTE SEVERE  . Dementia with behavioral disturbance    PER NEUROLOGIST NOTE PT ABLE TO DO ADL'S BUT UNABLE TO CARE FOR HOME AND NEEDED AND PT HAS 24 HOUR CARE  . Dyslipidemia   . Fibromyalgia   . GERD (gastroesophageal reflux disease)   . Hemorrhoids   . History of breast cancer oncologist-  dr Humphrey Rolls--- no recurrence   dx 05-19-2012  Stage I , Invasive Lobular/ LCIS & microinvasive ductal and DCIS (T1 A NX M0)--  s/p  left partial mastectomy 07-04-2012 and radiation  . History of diverticulitis of colon   . History of radiation therapy 08-10-2012 to 09-08-2012   Left breast 750 cGy in 3 sessions  . History of squamous cell carcinoma excision    NOSE  . Hypertension   . OSA on CPAP   . Prolapsed internal hemorrhoids, grade 3   . Varicose veins     Patient Active Problem List   Diagnosis Date Noted  . Closed right hip fracture, initial encounter ([place]) 10/30/2016  . OSA on CPAP 10/30/2016  . Volume depletion   . GI bleed 11/20/2015  . Gastrointestinal hemorrhage associated with intestinal diverticulitis   . Diverticulitis of colon   . Intra-abdominal hematoma 01/06/2015  . Normocytic normochromic anemia 01/06/2015  . Nausea vomiting and diarrhea 01/06/2015  . Bleeding internal hemorrhoids 07/16/2014  . Anal fissure - posterior 06/17/2014  . Dementia 06/15/2014  . Generalized abdominal pain   . Abdominal pain   . Pancreatitis 05/24/2014  . Leukocytosis 05/24/2014  . GERD (gastroesophageal reflux disease) 05/24/2014  . Dementia with behavioral disturbance 02/27/2014  . Rectal bleeding 05/21/2013  . Hypokalemia 05/21/2013  . Depression 05/21/2013  . Invasive lobular carcinoma of breast, stage 1 ([facility]) 06/23/2012  . Hypertension, essential, benign   . Fibromyalgia   . Achalasia, esophageal     Past Surgical History:  Procedure Laterality Date  . BREAST LUMPECTOMY WITH NEEDLE LOCALIZATION  07/04/2012   Procedure: BREAST LUMPECTOMY WITH NEEDLE LOCALIZATION;  Surgeon: Edward Jolly, MD;  Location: [facility];  Service: General;  Laterality: Left;  left  . CHOLECYSTECTOMY  2007  . COLONOSCOPY    . ESOPHAGEAL DILATION    . HEMORRHOID BANDING  Mar & Apr 2016  .  HEMORRHOID SURGERY N/A 12/25/2014   Procedure: HEMORRHOIDOPEXY WITH ANAL EXAM;  Surgeon: Leighton Ruff, MD;  Location: [facility];  Service: General;  Laterality: N/A;  . [place]    . TOTAL ABDOMINAL HYSTERECTOMY  1976  . VARICOSE VEIN SURGERY  2010   left lower extremity    OB History    No data available       Home Medications    Prior to Admission medications   Medication Sig Start Date End Date Taking? Authorizing Provider  Cholecalciferol (VITAMIN D) 2000 UNITS CAPS Take 1 capsule by mouth daily.   Yes [provider]  losartan  (COZAAR) 100 MG tablet Take 1 tablet (100 mg total) by mouth daily. 07/20/16  Yes Martinique, Betty G, MD  naproxen sodium (ANAPROX) 220 MG tablet Take 440 mg by mouth 2 (two) times daily as needed (pain).   Yes [provider]  sertraline (ZOLOFT) 100 MG tablet Take 100 mg by mouth daily. 07/13/16  Yes [provider]  vitamin B-12 (CYANOCOBALAMIN) 1000 MCG tablet Take 1,000 mcg by mouth daily.   Yes [provider]  oxyCODONE-acetaminophen (PERCOCET) 5-325 MG tablet Take 1-2 tablets by mouth every 8 (eight) hours as needed for severe pain. Patient not taking: Reported on 10/30/2016 09/18/16   Mesner, Corene Cornea, MD    Family History Family History  Problem Relation Age of Onset  . Cancer Mother 73       melanoma ca  . Arthritis Father   . Dementia Neg Hx     Social History Social History  Substance Use Topics  . Smoking status: Never Smoker  . Smokeless tobacco: Never Used  . Alcohol use No     Allergies   Penicillins; Statins; Sulfa drugs cross reactors; Tramadol hcl; Aspirin; Codeine; and Morphine and related   Review of Systems Review of Systems  Unable to perform ROS: Dementia     Physical Exam Updated Vital Signs BP (!) 163/84 (BP Location: Left Arm)   Pulse 89   Temp 97.7 F (36.5 C) (Oral)   Resp 17   SpO2 100%   Physical Exam  Constitutional: She is oriented to person, place, and time. She appears well-developed and well-nourished. No distress. Cervical collar in place.  HENT:  Head: Normocephalic and atraumatic.  Right Ear: External ear normal.  Left Ear: External ear normal.  Nose: Nose normal.  Eyes: Conjunctivae and EOM are normal. Pupils are equal, round, and reactive to light. Right eye exhibits no discharge. Left eye exhibits no discharge. No scleral icterus.  Neck: Normal range of motion. Neck supple.  Cardiovascular: Normal rate, regular rhythm and normal heart sounds.  Exam reveals no gallop and no friction rub.   No murmur  heard. Pulses:      Radial pulses are 2+ on the right side, and 2+ on the left side.       Dorsalis pedis pulses are 2+ on the right side, and 2+ on the left side.  Pulmonary/Chest: Effort normal and breath sounds normal. No stridor. No respiratory distress. She has no wheezes.  Abdominal: Soft. She exhibits no distension. There is no tenderness.  Musculoskeletal: She exhibits no edema.       Right hip: She exhibits tenderness and bony tenderness.       Right knee: Tenderness found.       Cervical back: She exhibits no bony tenderness.       Thoracic back: She exhibits no bony tenderness.  Lumbar back: She exhibits no bony tenderness.       Right lower leg: She exhibits tenderness.  Clavicles stable. Chest stable to AP/Lat compression. Pelvis stable to Lat compression. No obvious extremity deformity. No chest or abdominal wall contusion.  Neurological: She is alert and oriented to person, place, and time.  Moving all extremities  Skin: Skin is warm and dry. No rash noted. She is not diaphoretic. No erythema.  Psychiatric: She has a normal mood and affect.     ED Treatments / Results  Labs (all labs ordered are listed, but only abnormal results are displayed) Labs Reviewed  COMPREHENSIVE METABOLIC PANEL - Abnormal; Notable for the following:       Result Value   Glucose, Bld 163 (*)    GFR calc non Af Amer 53 (*)    All other components within normal limits  CBC - Abnormal; Notable for the following:    WBC 10.9 (*)    Hemoglobin 15.4 (*)    All other components within normal limits  LIPASE, BLOOD  PROTIME-INR  URINALYSIS, ROUTINE W REFLEX MICROSCOPIC    EKG  EKG Interpretation None       Radiology Dg Chest 1 View  Result Date: 10/30/2016 CLINICAL DATA:  Close right hip fracture, preoperative evaluation. History of left breast cancer and hypertension. EXAM: CHEST 1 VIEW COMPARISON:  Chest x-ray dated 08/30/2011. FINDINGS: Heart size and mediastinal contours  are normal. Atherosclerotic changes noted at the aortic arch. Lungs are clear. Questionable small pneumothorax within the right upper lobe, more likely artifactual related to the overlying scapula. No pleural effusions seen. No acute or suspicious osseous finding. IMPRESSION: 1. Lucency along the lateral margin of the right upper lobe, questionable small pneumothorax versus artifactual related to the superimposed scapula. Recommend repeat chest x-ray, with additional lordotic view, to exclude pneumothorax. 2. Lungs are clear. 3. Aortic atherosclerosis. Electronically Signed   By: Franki Cabot M.D.   On: 10/30/2016 23:47   Dg Tibia/fibula Right  Result Date: 10/30/2016 CLINICAL DATA:  Pt c/o right leg pain s/p fall today at nursing home. Known fracture of right hip. H/o arthritis. *Best obtainable, pt in severe pain. EXAM: RIGHT TIBIA AND FIBULA - 2 VIEW COMPARISON:  None. FINDINGS: Osseous alignment is normal. No fracture line or displaced fracture fragment seen. Incidental note of chondrocalcinosis within the medial and lateral knee compartments indicating underlying CPPD. IMPRESSION: 1. No acute findings. No osseous fracture or dislocation within the tibia or fibula. 2. Evidence of CPPD at the right knee. No osteophytes or other signs of secondary degenerative osteoarthritis. Electronically Signed   By: Franki Cabot M.D.   On: 10/30/2016 22:32   Ct Head Wo Contrast  Result Date: 10/30/2016 CLINICAL DATA:  Slipped and fell, no head injury or loss of consciousness. Vomiting. History of hypertension, dyslipidemia, dementia. EXAM: CT HEAD WITHOUT CONTRAST TECHNIQUE: Contiguous axial images were obtained from the base of the skull through the vertex without intravenous contrast. COMPARISON:  MRI of the head May 04, 2013 FINDINGS: Mildly motion degraded examination. BRAIN: No intraparenchymal hemorrhage, mass effect nor midline shift. The ventricles and sulci are normal for age, stable mildly  asymmetrically enlarged LEFT lateral ventricle. Patchy supratentorial white matter hypodensities less than expected for patient's age, though non-specific are most compatible with chronic small vessel ischemic disease. No acute large vascular territory infarcts. No abnormal extra-axial fluid collections. Basal cisterns are patent. VASCULAR: Moderate calcific atherosclerosis of the carotid siphons. SKULL: No skull fracture. No significant scalp  soft tissue swelling. SINUSES/ORBITS: Small LEFT frontal mucosal retention cyst. No paranasal sinus air-fluid levels. Mastoid air cells are well aerated.The included ocular globes and orbital contents are non-suspicious. Status post bilateral ocular lens implants. OTHER: None. IMPRESSION: No acute intracranial process on this mildly motion degraded examination. Stable negative noncontrast CT HEAD for age. Electronically Signed   By: Elon Alas M.D.   On: 10/30/2016 22:22   Dg Knee Complete 4 Views Right  Result Date: 10/30/2016 CLINICAL DATA:  Pt c/o right leg pain s/p fall today in nursing home. Known fracture of right hip. H/o arthritis. *Best obtainable, pt in severe pain. EXAM: RIGHT KNEE - COMPLETE 4+ VIEW COMPARISON:  None. FINDINGS: Osseous alignment is normal. No fracture line or displaced fracture fragment seen. No appreciable joint effusion and adjacent soft tissues are unremarkable. Incidental note made of faint calcifications within the medial and lateral compartments, compatible with chondrocalcinosis, indicating underlying CPPD. IMPRESSION: 1. No acute findings.  No osseous fracture or dislocation. 2. Evidence of CPPD. No osteophytes or other signs of advanced degenerative osteoarthritis. Electronically Signed   By: Franki Cabot M.D.   On: 10/30/2016 22:34   Dg Hip Unilat  With Pelvis 2-3 Views Right  Result Date: 10/30/2016 CLINICAL DATA:  Right hip pain s/p slip and fall at nursing home pta. Hx arthritis, dementia, EXAM: DG HIP (WITH OR WITHOUT  PELVIS) 2-3V RIGHT COMPARISON:  None. FINDINGS: Displaced fracture of the right femoral neck, intertrochanteric, with associated angulation deformity and probable impaction at the fracture site. Right femoral head remains well positioned relative to the acetabulum. No additional fracture or dislocation seen. Degenerative spurring noted within the slightly scoliotic lower lumbar spine. Soft tissues about the pelvis and right hip are unremarkable. IMPRESSION: Displaced fracture of the right femoral neck, intertrochanteric, with associated angulation deformity at the fracture site and probable impaction at the fracture site. Right femoral head remains well positioned relative to the acetabulum. Electronically Signed   By: Franki Cabot M.D.   On: 10/30/2016 21:42    Procedures Procedures (including critical care time)  Medications Ordered in ED Medications  ondansetron [facility]) injection 4 mg (4 mg Intravenous Given 10/30/16 2105)  ondansetron (ZOFRAN) injection 4 mg (4 mg Intravenous Given 10/30/16 2227)  fentaNYL (SUBLIMAZE) injection 50 mcg (50 mcg Intravenous Given 10/30/16 2327)     Initial Impression / Assessment and Plan / ED Course  I have reviewed the triage vital signs and the nursing notes.  Pertinent labs & imaging results that were available during my care of the patient were reviewed by me and considered in my medical decision making (see chart for details).     Given the mechanism of action as CT cervical spine was withheld. Due to the patient's emesis while in route, CT head was obtained which revealed no evidence of acute intracranial hemorrhage. Plain films of the right lower extremity were obtained which revealed a right femoral neck fracture. Preop labs were obtained. Case is discussed with orthopedic surgery who requested patient [facility] for [facility] evaluation and management. Patient was admitted to the hospitalist will transfer the patient.  Final Clinical  Impressions(s) / ED Diagnoses   Final diagnoses:  Fall  Closed displaced fracture of right femoral neck ([place])      Konnor Vondrasek, Grayce Sessions, MD 10/31/16 0009

## 2016-10-31 ENCOUNTER — Inpatient Hospital Stay (HOSPITAL_COMMUNITY): Payer: Medicare Other | Admitting: Certified Registered Nurse Anesthetist

## 2016-10-31 ENCOUNTER — Encounter (HOSPITAL_COMMUNITY)
Admission: EM | Disposition: A | Discharge status: Skilled Nursing Facility | Payer: Self-pay | Source: Home / Self Care | Attending: Internal Medicine

## 2016-10-31 ENCOUNTER — Inpatient Hospital Stay (HOSPITAL_COMMUNITY): Payer: Medicare Other

## 2016-10-31 DIAGNOSIS — R739 Hyperglycemia, unspecified: Secondary | ICD-10-CM | POA: Diagnosis present

## 2016-10-31 DIAGNOSIS — S72001A Fracture of unspecified part of neck of right femur, initial encounter for closed fracture: Secondary | ICD-10-CM

## 2016-10-31 HISTORY — PX: INTRAMEDULLARY (IM) NAIL INTERTROCHANTERIC: SHX5875

## 2016-10-31 LAB — URINALYSIS, ROUTINE W REFLEX MICROSCOPIC
BACTERIA UA: NONE SEEN
BILIRUBIN URINE: NEGATIVE
Glucose, UA: NEGATIVE mg/dL
Hgb urine dipstick: NEGATIVE
KETONES UR: 20 mg/dL — AB
Leukocytes, UA: NEGATIVE
Nitrite: NEGATIVE
Protein, ur: 30 mg/dL — AB
Specific Gravity, Urine: 1.027 (ref 1.005–1.030)
pH: 6 (ref 5.0–8.0)

## 2016-10-31 LAB — GLUCOSE, CAPILLARY
GLUCOSE-CAPILLARY: 128 mg/dL — AB (ref 65–99)
GLUCOSE-CAPILLARY: 119 mg/dL — AB (ref 65–99)
Glucose-Capillary: 160 mg/dL — ABNORMAL HIGH (ref 65–99)
Glucose-Capillary: 133 mg/dL — ABNORMAL HIGH (ref 65–99)

## 2016-10-31 LAB — SURGICAL PCR SCREEN
MRSA, PCR: NEGATIVE
Staphylococcus aureus: NEGATIVE

## 2016-10-31 LAB — CBG MONITORING, ED: Glucose-Capillary: 144 mg/dL — ABNORMAL HIGH (ref 65–99)

## 2016-10-31 SURGERY — FIXATION, FRACTURE, INTERTROCHANTERIC, WITH INTRAMEDULLARY ROD
Anesthesia: General | Site: Hip | Laterality: Right

## 2016-10-31 MED ORDER — CHLORHEXIDINE GLUCONATE 4 % EX LIQD
60.0000 mL | Freq: Once | CUTANEOUS | Status: AC
  Administered 2016-10-31: 4 via TOPICAL
  Filled 2016-10-31: qty 45
  Filled 2016-10-31: qty 60

## 2016-10-31 MED ORDER — FENTANYL CITRATE (PF) 100 MCG/2ML IJ SOLN
12.5000 ug | INTRAMUSCULAR | Status: DC | PRN
  Administered 2016-10-31 (×5): 12.5 ug via INTRAVENOUS
  Filled 2016-10-31 (×6): qty 2

## 2016-10-31 MED ORDER — HYDROCODONE-ACETAMINOPHEN 5-325 MG PO TABS
1.0000 | ORAL_TABLET | Freq: Four times a day (QID) | ORAL | Status: DC | PRN
  Administered 2016-11-01: 2 via ORAL
  Administered 2016-11-01: 1 via ORAL
  Filled 2016-10-31: qty 1
  Filled 2016-10-31: qty 2

## 2016-10-31 MED ORDER — ONDANSETRON HCL 4 MG/2ML IJ SOLN: 4.0000 mg | Freq: Four times a day (QID) | INTRAMUSCULAR | Status: DC | PRN

## 2016-10-31 MED ORDER — 0.9 % SODIUM CHLORIDE (POUR BTL) OPTIME
TOPICAL | Status: DC | PRN
  Administered 2016-10-31: 1000 mL

## 2016-10-31 MED ORDER — ASPIRIN EC 81 MG PO TBEC: 81.0000 mg | DELAYED_RELEASE_TABLET | Freq: Every day | ORAL | Status: DC

## 2016-10-31 MED ORDER — BISACODYL 5 MG PO TBEC: 5.0000 mg | DELAYED_RELEASE_TABLET | Freq: Every day | ORAL | Status: DC | PRN

## 2016-10-31 MED ORDER — MORPHINE SULFATE (PF) 2 MG/ML IV SOLN: 0.5000 mg | INTRAVENOUS | Status: DC | PRN

## 2016-10-31 MED ORDER — LACTATED RINGERS IV SOLN
Freq: Once | INTRAVENOUS | Status: AC
  Administered 2016-10-31: 15:00:00 via INTRAVENOUS

## 2016-10-31 MED ORDER — SODIUM CHLORIDE 0.9 % IV SOLN
INTRAVENOUS | Status: AC
  Administered 2016-10-31: 100 mL/h via INTRAVENOUS

## 2016-10-31 MED ORDER — VITAMIN B-12 1000 MCG PO TABS
1000.0000 ug | ORAL_TABLET | Freq: Every day | ORAL | Status: DC
  Administered 2016-10-31 – 2016-11-02 (×3): 1000 ug via ORAL
  Filled 2016-10-31 (×3): qty 1

## 2016-10-31 MED ORDER — ACETAMINOPHEN 650 MG RE SUPP: 650.0000 mg | Freq: Four times a day (QID) | RECTAL | Status: DC | PRN

## 2016-10-31 MED ORDER — POLYETHYLENE GLYCOL 3350 17 G PO PACK: 17.0000 g | PACK | Freq: Every day | ORAL | Status: DC | PRN

## 2016-10-31 MED ORDER — ARTIFICIAL TEARS OPHTHALMIC OINT
TOPICAL_OINTMENT | OPHTHALMIC | Status: AC
  Filled 2016-10-31: qty 3.5

## 2016-10-31 MED ORDER — ACETAMINOPHEN 500 MG PO TABS: 500.0000 mg | ORAL_TABLET | Freq: Four times a day (QID) | ORAL | 0 refills | Status: DC | PRN

## 2016-10-31 MED ORDER — ONDANSETRON HCL 4 MG/2ML IJ SOLN
INTRAMUSCULAR | Status: AC
  Filled 2016-10-31: qty 4

## 2016-10-31 MED ORDER — FENTANYL CITRATE (PF) 250 MCG/5ML IJ SOLN
INTRAMUSCULAR | Status: AC
  Filled 2016-10-31: qty 5

## 2016-10-31 MED ORDER — FENTANYL CITRATE (PF) 100 MCG/2ML IJ SOLN
INTRAMUSCULAR | Status: DC | PRN
Start: 2016-10-31 — End: 2016-10-31
  Administered 2016-10-31: 25 ug via INTRAVENOUS
  Administered 2016-10-31 (×2): 50 ug via INTRAVENOUS

## 2016-10-31 MED ORDER — LACTATED RINGERS IV SOLN
INTRAVENOUS | Status: DC | PRN
  Administered 2016-10-31: 15:00:00 via INTRAVENOUS

## 2016-10-31 MED ORDER — POVIDONE-IODINE 10 % EX SWAB: 2.0000 "application " | Freq: Once | CUTANEOUS | Status: DC

## 2016-10-31 MED ORDER — FENTANYL CITRATE (PF) 100 MCG/2ML IJ SOLN
25.0000 ug | INTRAMUSCULAR | Status: DC | PRN
  Administered 2016-10-31: 50 ug via INTRAVENOUS
  Administered 2016-10-31 (×2): 25 ug via INTRAVENOUS

## 2016-10-31 MED ORDER — PHENOL 1.4 % MT LIQD: 1.0000 | OROMUCOSAL | Status: DC | PRN

## 2016-10-31 MED ORDER — EPHEDRINE 5 MG/ML INJ
INTRAVENOUS | Status: AC
  Filled 2016-10-31: qty 10

## 2016-10-31 MED ORDER — FENTANYL CITRATE (PF) 100 MCG/2ML IJ SOLN
INTRAMUSCULAR | Status: AC
  Administered 2016-10-31: 50 ug via INTRAVENOUS
  Filled 2016-10-31: qty 2

## 2016-10-31 MED ORDER — CLINDAMYCIN PHOSPHATE 900 MG/50ML IV SOLN
900.0000 mg | INTRAVENOUS | Status: AC
  Administered 2016-10-31: 900 mg via INTRAVENOUS
  Filled 2016-10-31 (×2): qty 50

## 2016-10-31 MED ORDER — VITAMIN D 1000 UNITS PO TABS
1000.0000 [IU] | ORAL_TABLET | Freq: Every day | ORAL | Status: DC
  Administered 2016-10-31 – 2016-11-02 (×3): 1000 [IU] via ORAL
  Filled 2016-10-31 (×3): qty 1

## 2016-10-31 MED ORDER — ACETAMINOPHEN 325 MG PO TABS
650.0000 mg | ORAL_TABLET | Freq: Four times a day (QID) | ORAL | Status: DC | PRN
  Administered 2016-11-01 – 2016-11-02 (×2): 650 mg via ORAL
  Filled 2016-10-31 (×2): qty 2

## 2016-10-31 MED ORDER — PROPOFOL 10 MG/ML IV BOLUS
INTRAVENOUS | Status: AC
  Filled 2016-10-31: qty 40

## 2016-10-31 MED ORDER — ONDANSETRON HCL 4 MG PO TABS: 4.0000 mg | ORAL_TABLET | Freq: Four times a day (QID) | ORAL | Status: DC | PRN

## 2016-10-31 MED ORDER — MENTHOL 3 MG MT LOZG: 1.0000 | LOZENGE | OROMUCOSAL | Status: DC | PRN

## 2016-10-31 MED ORDER — HYDRALAZINE HCL 20 MG/ML IJ SOLN
10.0000 mg | INTRAMUSCULAR | Status: DC | PRN
  Filled 2016-10-31: qty 1

## 2016-10-31 MED ORDER — EPHEDRINE SULFATE 50 MG/ML IJ SOLN
INTRAMUSCULAR | Status: DC | PRN
  Administered 2016-10-31: 10 mg via INTRAVENOUS

## 2016-10-31 MED ORDER — SUCCINYLCHOLINE CHLORIDE 200 MG/10ML IV SOSY
PREFILLED_SYRINGE | INTRAVENOUS | Status: AC
  Filled 2016-10-31: qty 10

## 2016-10-31 MED ORDER — MORPHINE SULFATE (PF) 4 MG/ML IV SOLN: 0.5000 mg | INTRAVENOUS | Status: DC | PRN

## 2016-10-31 MED ORDER — METOCLOPRAMIDE HCL 5 MG PO TABS: 5.0000 mg | ORAL_TABLET | Freq: Three times a day (TID) | ORAL | Status: DC | PRN

## 2016-10-31 MED ORDER — PROPOFOL 10 MG/ML IV BOLUS
INTRAVENOUS | Status: DC | PRN
  Administered 2016-10-31: 100 mg via INTRAVENOUS

## 2016-10-31 MED ORDER — CLINDAMYCIN PHOSPHATE 600 MG/50ML IV SOLN
600.0000 mg | Freq: Four times a day (QID) | INTRAVENOUS | Status: AC
  Administered 2016-11-01: 600 mg via INTRAVENOUS
  Filled 2016-10-31 (×2): qty 50

## 2016-10-31 MED ORDER — INSULIN ASPART 100 UNIT/ML ~~LOC~~ SOLN
0.0000 [IU] | SUBCUTANEOUS | Status: DC
  Administered 2016-10-31: 2 [IU] via SUBCUTANEOUS
  Administered 2016-10-31 – 2016-11-01 (×4): 1 [IU] via SUBCUTANEOUS
  Administered 2016-11-01: 2 [IU] via SUBCUTANEOUS

## 2016-10-31 MED ORDER — ONDANSETRON HCL 4 MG/2ML IJ SOLN
INTRAMUSCULAR | Status: DC | PRN
  Administered 2016-10-31: 4 mg via INTRAVENOUS

## 2016-10-31 MED ORDER — SERTRALINE HCL 100 MG PO TABS
100.0000 mg | ORAL_TABLET | Freq: Every day | ORAL | Status: DC
  Administered 2016-10-31 – 2016-11-02 (×3): 100 mg via ORAL
  Filled 2016-10-31: qty 2
  Filled 2016-10-31 (×2): qty 1

## 2016-10-31 MED ORDER — ASPIRIN EC 81 MG PO TBEC: 81.0000 mg | DELAYED_RELEASE_TABLET | Freq: Every day | ORAL | 1 refills | Status: DC

## 2016-10-31 MED ORDER — PHENYLEPHRINE HCL 10 MG/ML IJ SOLN
INTRAMUSCULAR | Status: DC | PRN
  Administered 2016-10-31: 80 ug via INTRAVENOUS

## 2016-10-31 MED ORDER — FAMOTIDINE IN NACL 20-0.9 MG/50ML-% IV SOLN
20.0000 mg | Freq: Every day | INTRAVENOUS | Status: DC
  Administered 2016-10-31: 20 mg via INTRAVENOUS
  Filled 2016-10-31 (×4): qty 50

## 2016-10-31 MED ORDER — METOCLOPRAMIDE HCL 5 MG/ML IJ SOLN: 5.0000 mg | Freq: Three times a day (TID) | INTRAMUSCULAR | Status: DC | PRN

## 2016-10-31 SURGICAL SUPPLY — 34 items
BIT DRILL FLUTED FEMUR 4.2/3 (BIT) ×2 IMPLANT
BLADE SURG 15 STRL LF DISP TIS (BLADE) ×1 IMPLANT
BLADE SURG 15 STRL SS (BLADE) ×1
BLADE TFNA HELICAL 100 STRL (MISCELLANEOUS) ×2 IMPLANT
COVER BACK TABLE 60X90IN (DRAPES) IMPLANT
COVER PERINEAL POST (MISCELLANEOUS) ×2 IMPLANT
COVER SURGICAL LIGHT HANDLE (MISCELLANEOUS) ×4 IMPLANT
DRAPE C-ARM 42X72 X-RAY (DRAPES) IMPLANT
DRAPE STERI IOBAN 125X83 (DRAPES) ×2 IMPLANT
DRSG ADAPTIC 3X8 NADH LF (GAUZE/BANDAGES/DRESSINGS) IMPLANT
DRSG AQUACEL AG ADV 3.5X10 (GAUZE/BANDAGES/DRESSINGS) ×2 IMPLANT
DRSG MEPILEX BORDER 4X4 (GAUZE/BANDAGES/DRESSINGS) IMPLANT
DRSG MEPILEX BORDER 4X8 (GAUZE/BANDAGES/DRESSINGS) IMPLANT
ELECT REM PT RETURN 9FT ADLT (ELECTROSURGICAL) ×2
ELECTRODE REM PT RTRN 9FT ADLT (ELECTROSURGICAL) ×1 IMPLANT
EVACUATOR 1/8 PVC DRAIN (DRAIN) IMPLANT
GLOVE BIOGEL PI IND STRL 9 (GLOVE) ×1 IMPLANT
GLOVE BIOGEL PI INDICATOR 9 (GLOVE) ×1
GLOVE SURG ORTHO 9.0 STRL STRW (GLOVE) ×2 IMPLANT
GOWN STRL REUS W/ TWL XL LVL3 (GOWN DISPOSABLE) ×3 IMPLANT
GOWN STRL REUS W/TWL XL LVL3 (GOWN DISPOSABLE) ×3
GUIDEWIRE 3.2X400 (WIRE) ×2 IMPLANT
KIT BASIN OR (CUSTOM PROCEDURE TRAY) ×2 IMPLANT
KIT ROOM TURNOVER OR (KITS) ×2 IMPLANT
LINER BOOT UNIVERSAL DISP (MISCELLANEOUS) IMPLANT
MANIFOLD NEPTUNE II (INSTRUMENTS) IMPLANT
NAIL TROCH FIX 10X170 130 (Nail) ×2 IMPLANT
NS IRRIG 1000ML POUR BTL (IV SOLUTION) ×2 IMPLANT
PACK GENERAL/GYN (CUSTOM PROCEDURE TRAY) ×2 IMPLANT
PAD ARMBOARD 7.5X6 YLW CONV (MISCELLANEOUS) ×6 IMPLANT
SCREW LOCKING 5.0X34MM (Screw) ×2 IMPLANT
STAPLER VISISTAT 35W (STAPLE) ×2 IMPLANT
SUT VIC AB 2-0 CTB1 (SUTURE) ×2 IMPLANT
WATER STERILE IRR 1000ML POUR (IV SOLUTION) ×2 IMPLANT

## 2016-10-31 NOTE — H&P (Signed)
History and Physical    Kimberly Acevedo IRJ:[id number] DOB: [birth year] DOA: 10/30/2016  PCP: Deland Pretty, MD   Patient coming from: Home  Chief Complaint: Fall with right hip pain   HPI: Kimberly Acevedo is a 83 y.o. female with medical history significant for dementia with behavioral disturbance, GERD, hypertension, and OSA on CPAP, now presenting to the emergency department for evaluation of acute right hip pain following a fall. Patient is accompanied by her granddaughter who assists with the history. Patient had reportedly been in her usual state of health and had been out dancing earlier this evening, but just prior to arrival, she was reaching out to put her weight on a [place] when the [place] went out from under her and she fell onto her right side. She complained of immediate pain at the right hip. EMS was called about 10 the right leg was reportedly shortened. She was treated with 150 g of fentanyl en route. Per report of the patient's daughter, [name] ambulates about her house and up 6 stairs in her home without any apparent dyspnea. She likes to dance and had been doing that earlier in the day. She denies any recent fevers or chills, denies any chest pain or palpitations, and denies any significant dyspnea or cough. She denies headache, change in vision or hearing, or loss of coordination. There was no head strike or loss of consciousness with her fall.  ED Course: Upon arrival to the ED, patient is found to be afebrile, saturating well on room air, hypertensive to 176/94, and with vitals otherwise stable. EKG features a normal sinus rhythm and chest x-ray is negative for acute cardiopulmonary disease. Radiographs of the right hip, pelvis, tib-fib, and knee are notable for displaced fracture involving the right femoral neck, intertrochanteric with angulation deformity and probable impaction at the fracture site. Femoral head appears well positioned in the acetabulum. Chemistry panels  notable for a glucose of 163 and CBC features a slight leukocytosis to 10,900. Patient was treated with Zofran for nausea in the ED and orthopedic surgery was consulted by the ED physician. Surgery requested a medical admission of ecchymosis [facility] and will plan to see the patient there in the morning. Ms. [name] remained hemodynamically stable in the ED, has not been in any apparent restaurant or distress, and will be admitted to medical surgical unit [facility] for ongoing evaluation and management of fall with right hip fracture.  Review of Systems:  Unable to obtain complete ROS secondary to patient's clinical condition with dementia.  Past Medical History:  Diagnosis Date  . Anal fissure   . Arthritis   . Bilateral edema of lower extremity   . Cognitive dysfunction    DUE TO DEMENTIA--  PER NEUROLOGIST NOTE SEVERE  . Dementia with behavioral disturbance    PER NEUROLOGIST NOTE PT ABLE TO DO ADL'S BUT UNABLE TO CARE FOR HOME AND NEEDED AND PT HAS 24 HOUR CARE  . Dyslipidemia   . Fibromyalgia   . GERD (gastroesophageal reflux disease)   . Hemorrhoids   . History of breast cancer oncologist-  dr Humphrey Rolls--- no recurrence   dx 05-19-2012  Stage I , Invasive Lobular/ LCIS & microinvasive ductal and DCIS (T1 A NX M0)--  s/p  left partial mastectomy 07-04-2012 and radiation  . History of diverticulitis of colon   . History of radiation therapy 08-10-2012 to 09-08-2012   Left breast 750 cGy in 3 sessions  . History of squamous cell carcinoma  excision    NOSE  . Hypertension   . OSA on CPAP   . Prolapsed internal hemorrhoids, grade 3   . Varicose veins     Past Surgical History:  Procedure Laterality Date  . BREAST LUMPECTOMY WITH NEEDLE LOCALIZATION  07/04/2012   Procedure: BREAST LUMPECTOMY WITH NEEDLE LOCALIZATION;  Surgeon: Edward Jolly, MD;  Location: [facility];  Service: General;  Laterality: Left;  left  . CHOLECYSTECTOMY  2007  . COLONOSCOPY     . ESOPHAGEAL DILATION    . HEMORRHOID BANDING  Mar & Apr 2016  . HEMORRHOID SURGERY N/A 12/25/2014   Procedure: HEMORRHOIDOPEXY WITH ANAL EXAM;  Surgeon: Leighton Ruff, MD;  Location: [facility];  Service: General;  Laterality: N/A;  . [place]    . TOTAL ABDOMINAL HYSTERECTOMY  1976  . 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 She now enjoys visiting with her relatives and dancing.   reports that she has never smoked. She has never used smokeless tobacco. She reports that she does not drink alcohol or use drugs.  Allergies  Allergen Reactions  . Penicillins Swelling and Rash    Has patient had a PCN reaction causing immediate rash, facial/tongue/throat swelling, SOB or lightheadedness with hypotension: No Has patient had a PCN reaction causing severe rash involving mucus membranes or skin necrosis: No Has patient had a PCN reaction that required hospitalization No Has patient had a PCN reaction occurring within the last 10 years: No If all of the above answers are "NO", then may proceed with Cephalosporin use.  . Statins   . Sulfa Drugs Cross Reactors Shortness Of Breath  . Tramadol Hcl Anaphylaxis  . Aspirin Swelling  . Codeine Other (See Comments)    unknown  . Morphine And Related Other (See Comments)    Hypoglycemia    Family History  Problem Relation Age of Onset  . Cancer Mother 3       melanoma ca  . Arthritis Father   . Dementia Neg Hx      Prior to Admission medications   Medication Sig Start Date End Date Taking? Authorizing Provider  Cholecalciferol (VITAMIN D) 2000 UNITS CAPS Take 1 capsule by mouth daily.   Yes [provider]  losartan (COZAAR) 100 MG tablet Take 1 tablet (100 mg  total) by mouth daily. 07/20/16  Yes [place]ique, Betty G, MD  naproxen sodium (ANAPROX) 220 MG tablet Take 440 mg by mouth 2 (two) times daily as needed (pain).   Yes [provider]  sertraline (ZOLOFT) 100 MG tablet Take 100 mg by mouth daily. 07/13/16  Yes [provider]  vitamin B-12 (CYANOCOBALAMIN) 1000 MCG tablet Take 1,000 mcg by mouth daily.   Yes [provider]  oxyCODONE-acetaminophen (PERCOCET) 5-325 MG tablet Take 1-2 tablets by mouth every 8 (eight) hours as needed for severe pain. Patient not taking: Reported on 10/30/2016 09/18/16   Merrily Pew, MD    Physical Exam: Vitals:   10/30/16 2230 10/30/16 2240 10/30/16 2300 10/30/16 2315  BP: (!) 161/83 (!) 161/83 (!) 163/84 (!) 163/84  Pulse: 85 81  89  Resp: (!) 22 (!) 22 17 17   Temp:      TempSrc:  SpO2: 100% 100%  100%      Constitutional: No acute distress, calm, appears anxious.  Eyes: PERTLA, lids and conjunctivae normal ENMT: Mucous membranes are dry and tacky. Posterior pharynx clear of any exudate or lesions.   Neck: normal, supple, no masses, no thyromegaly Respiratory: clear to auscultation bilaterally, no wheezing, no crackles. Normal respiratory effort.  Cardiovascular: S1 & S2 heard, regular rate and rhythm. No extremity edema. No significant JVD. Abdomen: No distension, no tenderness, no masses palpated. Bowel sounds normal.  Musculoskeletal: no clubbing / cyanosis. RLE shortened with slight deformity and exquisite tenderness at right hip; she is neurovascularly intact distally.  Skin: no significant rashes, lesions, ulcers. Warm, dry, well-perfused. Neurologic: CN 2-12 grossly intact. Sensation intact, DTR normal. Strength 5/5 in all 4 limbs.  Psychiatric: Alert and oriented to person only. Very pleasant and cooperative.     Labs on Admission: I have personally reviewed following labs and imaging studies  CBC:  Recent Labs Lab 10/30/16 2137  WBC 10.9*  HGB 15.4*  HCT 44.8   MCV 91.4  PLT 176   Basic Metabolic Panel:  Recent Labs Lab 10/30/16 2137  NA 140  K 3.6  CL 105  CO2 23  GLUCOSE 163*  BUN 12  CREATININE 0.95  CALCIUM 9.4   GFR: CrCl cannot be calculated (Unknown ideal weight.). Liver Function Tests:  Recent Labs Lab 10/30/16 2137  AST 27  ALT 19  ALKPHOS 100  BILITOT 0.6  PROT 7.5  ALBUMIN 3.9    Recent Labs Lab 10/30/16 2137  LIPASE 23   No results for input(s): AMMONIA in the last 168 hours. Coagulation Profile:  Recent Labs Lab 10/30/16 2237  INR 1.01   Cardiac Enzymes: No results for input(s): CKTOTAL, CKMB, CKMBINDEX, TROPONINI in the last 168 hours. BNP (last 3 results) No results for input(s): PROBNP in the last 8760 hours. HbA1C: No results for input(s): HGBA1C in the last 72 hours. CBG: No results for input(s): GLUCAP in the last 168 hours. Lipid Profile: No results for input(s): CHOL, HDL, LDLCALC, TRIG, CHOLHDL, LDLDIRECT in the last 72 hours. Thyroid Function Tests: No results for input(s): TSH, T4TOTAL, FREET4, T3FREE, THYROIDAB in the last 72 hours. Anemia Panel: No results for input(s): VITAMINB12, FOLATE, FERRITIN, TIBC, IRON, RETICCTPCT in the last 72 hours. Urine analysis:    Component Value Date/Time   COLORURINE YELLOW 01/11/2015 1030   APPEARANCEUR TURBID (A) 01/11/2015 1030   LABSPEC 1.016 01/11/2015 1030   PHURINE 8.0 01/11/2015 1030   GLUCOSEU NEGATIVE 01/11/2015 1030   HGBUR LARGE (A) 01/11/2015 1030   BILIRUBINUR NEGATIVE 01/11/2015 1030   KETONESUR NEGATIVE 01/11/2015 1030   PROTEINUR 100 (A) 01/11/2015 1030   UROBILINOGEN 1.0 01/11/2015 1030   NITRITE NEGATIVE 01/11/2015 1030   LEUKOCYTESUR LARGE (A) 01/11/2015 1030   Sepsis Labs: @LABRCNTIP (procalcitonin:4,lacticidven:4) )No results found for this or any previous visit (from the past 240 hour(s)).   Radiological Exams on Admission: Dg Chest 1 View  Result Date: 10/30/2016 CLINICAL DATA:  Close right hip fracture,  preoperative evaluation. History of left breast cancer and hypertension. EXAM: CHEST 1 VIEW COMPARISON:  Chest x-ray dated 08/30/2011. FINDINGS: Heart size and mediastinal contours are normal. Atherosclerotic changes noted at the aortic arch. Lungs are clear. Questionable small pneumothorax within the right upper lobe, more likely artifactual related to the overlying scapula. No pleural effusions seen. No acute or suspicious osseous finding. IMPRESSION: 1. Lucency along the lateral margin of the right upper lobe, questionable small pneumothorax versus  artifactual related to the superimposed scapula. Recommend repeat chest x-ray, with additional lordotic view, to exclude pneumothorax. 2. Lungs are clear. 3. Aortic atherosclerosis. Electronically Signed   By: Franki Cabot M.D.   On: 10/30/2016 23:47   Dg Tibia/fibula Right  Result Date: 10/30/2016 CLINICAL DATA:  Pt c/o right leg pain s/p fall today at nursing home. Known fracture of right hip. H/o arthritis. *Best obtainable, pt in severe pain. EXAM: RIGHT TIBIA AND FIBULA - 2 VIEW COMPARISON:  None. FINDINGS: Osseous alignment is normal. No fracture line or displaced fracture fragment seen. Incidental note of chondrocalcinosis within the medial and lateral knee compartments indicating underlying CPPD. IMPRESSION: 1. No acute findings. No osseous fracture or dislocation within the tibia or fibula. 2. Evidence of CPPD at the right knee. No osteophytes or other signs of secondary degenerative osteoarthritis. Electronically Signed   By: Franki Cabot M.D.   On: 10/30/2016 22:32   Ct Head Wo Contrast  Result Date: 10/30/2016 CLINICAL DATA:  Slipped and fell, no head injury or loss of consciousness. Vomiting. History of hypertension, dyslipidemia, dementia. EXAM: CT HEAD WITHOUT CONTRAST TECHNIQUE: Contiguous axial images were obtained from the base of the skull through the vertex without intravenous contrast. COMPARISON:  MRI of the head May 04, 2013  FINDINGS: Mildly motion degraded examination. BRAIN: No intraparenchymal hemorrhage, mass effect nor midline shift. The ventricles and sulci are normal for age, stable mildly asymmetrically enlarged LEFT lateral ventricle. Patchy supratentorial white matter hypodensities less than expected for patient's age, though non-specific are most compatible with chronic small vessel ischemic disease. No acute large vascular territory infarcts. No abnormal extra-axial fluid collections. Basal cisterns are patent. VASCULAR: Moderate calcific atherosclerosis of the carotid siphons. SKULL: No skull fracture. No significant scalp soft tissue swelling. SINUSES/ORBITS: Small LEFT frontal mucosal retention cyst. No paranasal sinus air-fluid levels. Mastoid air cells are well aerated.The included ocular globes and orbital contents are non-suspicious. Status post bilateral ocular lens implants. OTHER: None. IMPRESSION: No acute intracranial process on this mildly motion degraded examination. Stable negative noncontrast CT HEAD for age. Electronically Signed   By: Elon Alas M.D.   On: 10/30/2016 22:22   Dg Knee Complete 4 Views Right  Result Date: 10/30/2016 CLINICAL DATA:  Pt c/o right leg pain s/p fall today in nursing home. Known fracture of right hip. H/o arthritis. *Best obtainable, pt in severe pain. EXAM: RIGHT KNEE - COMPLETE 4+ VIEW COMPARISON:  None. FINDINGS: Osseous alignment is normal. No fracture line or displaced fracture fragment seen. No appreciable joint effusion and adjacent soft tissues are unremarkable. Incidental note made of faint calcifications within the medial and lateral compartments, compatible with chondrocalcinosis, indicating underlying CPPD. IMPRESSION: 1. No acute findings.  No osseous fracture or dislocation. 2. Evidence of CPPD. No osteophytes or other signs of advanced degenerative osteoarthritis. Electronically Signed   By: Franki Cabot M.D.   On: 10/30/2016 22:34   Dg Hip Unilat  With  Pelvis 2-3 Views Right  Result Date: 10/30/2016 CLINICAL DATA:  Right hip pain s/p slip and fall at nursing home pta. Hx arthritis, dementia, EXAM: DG HIP (WITH OR WITHOUT PELVIS) 2-3V RIGHT COMPARISON:  None. FINDINGS: Displaced fracture of the right femoral neck, intertrochanteric, with associated angulation deformity and probable impaction at the fracture site. Right femoral head remains well positioned relative to the acetabulum. No additional fracture or dislocation seen. Degenerative spurring noted within the slightly scoliotic lower lumbar spine. Soft tissues about the pelvis and right hip are unremarkable. IMPRESSION:  Displaced fracture of the right femoral neck, intertrochanteric, with associated angulation deformity at the fracture site and probable impaction at the fracture site. Right femoral head remains well positioned relative to the acetabulum. Electronically Signed   By: Franki Cabot M.D.   On: 10/30/2016 21:42    EKG: Independently reviewed. Normal sinus rhythm.   Assessment/Plan  1. Right hip fracture  - Pt presents following a fall onto her right side with no head-strike or LOC, but immediate and severe pain in right hip  - Radiographs reveal displaced intertrochanteric fracture of right femoral neck with angulation deformity and probable impaction at fracture site  - Orthopedic surgery is consulting and much appreciated  - Pt enjoys dancing and ambulates about her home, including up and down six steps without any difficulty; no known cardiopulmonary disease  - No arrhythmia on EKG, CXR clear, INR wnl, good renal fxn, UA unremarkable   - Plan to keep NPO, continue analgesia, continue gentle IVF hydration - Based on the available data, Ms. Murrill presents a relatively-low (0.4%) estimated risk probability of perioperative MI or cardiac arrest per Melburn Hake al  2. Hypertension  - BP elevated in ED in setting of pain and anxiety, improved spontaneously  - Managed at home with  losartan; will hold in anticipation of non-cardiac surgery  - Treat with prn hydralazine IVP's for now   3. GERD - No EGD report on file  - She is NPO for surgery, will start Pepcid    4. Dementia - Appears to be stable; pt able to contribute some to history  - Family at bedside warned about high-incidence of delirium in these settings; will continue to reorient, reassure pt   5. OSA  - Tolerating CPAP at home, will continue qHS   6. Hyperglycemia - Serum glucose 163, no hx DM, possibly secondary to acute hip fracture - Check CBG q4h while NPO and cover with low-intensity sliding-scale  - Check A1c    DVT prophylaxis: SCD's; pharmacologic ppx per surgery  Code Status: Full for surgery Family Communication: Granddaughter updated at bedside Disposition Plan: Admit to med-surg at [facility] Consults called: Orthopedic surgery Admission status: Inpatient    Vianne Bulls, MD [facility] Pager [phone]  If 7PM-7AM, please contact night-coverage [email or web address] Password [email or web address]  10/31/2016, 12:19 AM

## 2016-10-31 NOTE — Consult Note (Signed)
ORTHOPAEDIC CONSULTATION  REQUESTING PHYSICIAN: Reyne Dumas, MD  Chief Complaint: Right hip pain.  HPI: Kimberly Acevedo is a 83 y.o. female who presents with right intertrochanteric hip fracture. Patient had a ground-level fall. Patient does have dementia.  Past Medical History:  Diagnosis Date  . Anal fissure   . Arthritis   . Bilateral edema of lower extremity   . Cognitive dysfunction    DUE TO DEMENTIA--  PER NEUROLOGIST NOTE SEVERE  . Dementia with behavioral disturbance    PER NEUROLOGIST NOTE PT ABLE TO DO ADL'S BUT UNABLE TO CARE FOR HOME AND NEEDED AND PT HAS 24 HOUR CARE  . Dyslipidemia   . Fibromyalgia   . GERD (gastroesophageal reflux disease)   . Hemorrhoids   . History of breast cancer oncologist-  dr Humphrey Rolls--- no recurrence   dx 05-19-2012  Stage I , Invasive Lobular/ LCIS & microinvasive ductal and DCIS (T1 A NX M0)--  s/p  left partial mastectomy 07-04-2012 and radiation  . History of diverticulitis of colon   . History of radiation therapy 08-10-2012 to 09-08-2012   Left breast 750 cGy in 3 sessions  . History of squamous cell carcinoma excision    NOSE  . Hypertension   . OSA on CPAP   . Prolapsed internal hemorrhoids, grade 3   . Varicose veins    Past Surgical History:  Procedure Laterality Date  . BREAST LUMPECTOMY WITH NEEDLE LOCALIZATION  07/04/2012   Procedure: BREAST LUMPECTOMY WITH NEEDLE LOCALIZATION;  Surgeon: Edward Jolly, MD;  Location: [place];  Service: General;  Laterality: Left;  left  . CHOLECYSTECTOMY  2007  . COLONOSCOPY    . ESOPHAGEAL DILATION    . HEMORRHOID BANDING  Mar & Apr 2016  . HEMORRHOID SURGERY N/A 12/25/2014   Procedure: HEMORRHOIDOPEXY WITH ANAL EXAM;  Surgeon: Leighton Ruff, MD;  Location: [place];  Service: General;  Laterality: N/A;  . [place]    . TOTAL ABDOMINAL HYSTERECTOMY  1976  . VARICOSE VEIN SURGERY  2010   left lower extremity   Social History    Social History  . Marital status: Widowed    Spouse name: N/A  . Number of children: 1  . Years of education: 56   Occupational History  . interior design   . Retired     Social History Main Topics  . Smoking status: Never Smoker  . Smokeless tobacco: Never Used  . Alcohol use No  . Drug use: No  . Sexual activity: Not Asked   Other Topics Concern  . None   Social History Narrative   Patient lives at home alone with 2 caregivers.    Patient is retired.    Patient has 1 child.    Patient has an 11th grade education.    Patient is right handed.    Family History  Problem Relation Age of Onset  . Cancer Mother 53       melanoma ca  . Arthritis Father   . Dementia Neg Hx    - negative except otherwise stated in the family history section Allergies  Allergen Reactions  . Penicillins Swelling and Rash    Has patient had a PCN reaction causing immediate rash, facial/tongue/throat swelling, SOB or lightheadedness with hypotension: No Has patient had a PCN reaction causing severe rash involving mucus membranes or skin necrosis: No Has patient had a PCN reaction that required hospitalization No Has patient had a PCN reaction  occurring within the last 10 years: No If all of the above answers are "NO", then may proceed with Cephalosporin use.  . Statins   . Sulfa Drugs Cross Reactors Shortness Of Breath  . Tramadol Hcl Anaphylaxis  . Aspirin Swelling  . Codeine Other (See Comments)    unknown  . Morphine And Related Other (See Comments)    Hypoglycemia   Prior to Admission medications   Medication Sig Start Date End Date Taking? Authorizing Provider  Cholecalciferol (VITAMIN D) 2000 UNITS CAPS Take 1 capsule by mouth daily.   Yes [provider]  losartan (COZAAR) 100 MG tablet Take 1 tablet (100 mg total) by mouth daily. 07/20/16  Yes [place], Betty G, MD  naproxen sodium (ANAPROX) 220 MG tablet Take 440 mg by mouth 2 (two) times daily as needed (pain).   Yes  [provider]  sertraline (ZOLOFT) 100 MG tablet Take 100 mg by mouth daily. 07/13/16  Yes [provider]  vitamin B-12 (CYANOCOBALAMIN) 1000 MCG tablet Take 1,000 mcg by mouth daily.   Yes [provider]  oxyCODONE-acetaminophen (PERCOCET) 5-325 MG tablet Take 1-2 tablets by mouth every 8 (eight) hours as needed for severe pain. Patient not taking: Reported on 10/30/2016 09/18/16   Merrily Pew, MD   Dg Chest 1 View  Result Date: 10/30/2016 CLINICAL DATA:  Close right hip fracture, preoperative evaluation. History of left breast cancer and hypertension. EXAM: CHEST 1 VIEW COMPARISON:  Chest x-ray dated 08/30/2011. FINDINGS: Heart size and mediastinal contours are normal. Atherosclerotic changes noted at the aortic arch. Lungs are clear. Questionable small pneumothorax within the right upper lobe, more likely artifactual related to the overlying scapula. No pleural effusions seen. No acute or suspicious osseous finding. IMPRESSION: 1. Lucency along the lateral margin of the right upper lobe, questionable small pneumothorax versus artifactual related to the superimposed scapula. Recommend repeat chest x-ray, with additional lordotic view, to exclude pneumothorax. 2. Lungs are clear. 3. Aortic atherosclerosis. Electronically Signed   By: Franki Cabot M.D.   On: 10/30/2016 23:47   Dg Chest W/ Lordotic  Result Date: 10/31/2016 CLINICAL DATA:  Question of pneumothorax on prior chest radiograph. Follow-up chest radiograph requested. EXAM: CHEST - 1 VIEW WITH LORDOTIC VIEW(S) COMPARISON:  Chest radiograph performed 10/30/2016 FINDINGS: The lungs are well-aerated and clear. There is no evidence of focal opacification, pleural effusion or pneumothorax. The near lucency overlying the right lung apex is no longer seen and likely reflected a skin fold. The cardiomediastinal silhouette is within normal limits. No acute osseous abnormalities are seen. IMPRESSION: No acute cardiopulmonary  process seen.  No evidence of pneumothorax. Electronically Signed   By: Garald Balding M.D.   On: 10/31/2016 00:57   Dg Tibia/fibula Right  Result Date: 10/30/2016 CLINICAL DATA:  Pt c/o right leg pain s/p fall today at nursing home. Known fracture of right hip. H/o arthritis. *Best obtainable, pt in severe pain. EXAM: RIGHT TIBIA AND FIBULA - 2 VIEW COMPARISON:  None. FINDINGS: Osseous alignment is normal. No fracture line or displaced fracture fragment seen. Incidental note of chondrocalcinosis within the medial and lateral knee compartments indicating underlying CPPD. IMPRESSION: 1. No acute findings. No osseous fracture or dislocation within the tibia or fibula. 2. Evidence of CPPD at the right knee. No osteophytes or other signs of secondary degenerative osteoarthritis. Electronically Signed   By: Franki Cabot M.D.   On: 10/30/2016 22:32   Ct Head Wo Contrast  Result Date: 10/30/2016 CLINICAL DATA:  Slipped and  fell, no head injury or loss of consciousness. Vomiting. History of hypertension, dyslipidemia, dementia. EXAM: CT HEAD WITHOUT CONTRAST TECHNIQUE: Contiguous axial images were obtained from the base of the skull through the vertex without intravenous contrast. COMPARISON:  MRI of the head May 04, 2013 FINDINGS: Mildly motion degraded examination. BRAIN: No intraparenchymal hemorrhage, mass effect nor midline shift. The ventricles and sulci are normal for age, stable mildly asymmetrically enlarged LEFT lateral ventricle. Patchy supratentorial white matter hypodensities less than expected for patient's age, though non-specific are most compatible with chronic small vessel ischemic disease. No acute large vascular territory infarcts. No abnormal extra-axial fluid collections. Basal cisterns are patent. VASCULAR: Moderate calcific atherosclerosis of the carotid siphons. SKULL: No skull fracture. No significant scalp soft tissue swelling. SINUSES/ORBITS: Small LEFT frontal mucosal retention  cyst. No paranasal sinus air-fluid levels. Mastoid air cells are well aerated.The included ocular globes and orbital contents are non-suspicious. Status post bilateral ocular lens implants. OTHER: None. IMPRESSION: No acute intracranial process on this mildly motion degraded examination. Stable negative noncontrast CT HEAD for age. Electronically Signed   By: Elon Alas M.D.   On: 10/30/2016 22:22   Dg Knee Complete 4 Views Right  Result Date: 10/30/2016 CLINICAL DATA:  Pt c/o right leg pain s/p fall today in nursing home. Known fracture of right hip. H/o arthritis. *Best obtainable, pt in severe pain. EXAM: RIGHT KNEE - COMPLETE 4+ VIEW COMPARISON:  None. FINDINGS: Osseous alignment is normal. No fracture line or displaced fracture fragment seen. No appreciable joint effusion and adjacent soft tissues are unremarkable. Incidental note made of faint calcifications within the medial and lateral compartments, compatible with chondrocalcinosis, indicating underlying CPPD. IMPRESSION: 1. No acute findings.  No osseous fracture or dislocation. 2. Evidence of CPPD. No osteophytes or other signs of advanced degenerative osteoarthritis. Electronically Signed   By: Franki Cabot M.D.   On: 10/30/2016 22:34   Dg Hip Unilat  With Pelvis 2-3 Views Right  Result Date: 10/30/2016 CLINICAL DATA:  Right hip pain s/p slip and fall at nursing home pta. Hx arthritis, dementia, EXAM: DG HIP (WITH OR WITHOUT PELVIS) 2-3V RIGHT COMPARISON:  None. FINDINGS: Displaced fracture of the right femoral neck, intertrochanteric, with associated angulation deformity and probable impaction at the fracture site. Right femoral head remains well positioned relative to the acetabulum. No additional fracture or dislocation seen. Degenerative spurring noted within the slightly scoliotic lower lumbar spine. Soft tissues about the pelvis and right hip are unremarkable. IMPRESSION: Displaced fracture of the right femoral neck,  intertrochanteric, with associated angulation deformity at the fracture site and probable impaction at the fracture site. Right femoral head remains well positioned relative to the acetabulum. Electronically Signed   By: Franki Cabot M.D.   On: 10/30/2016 21:42   - pertinent xrays, CT, MRI studies were reviewed and independently interpreted  Positive ROS: All other systems have been reviewed and were otherwise negative with the exception of those mentioned in the HPI and as above.  Physical Exam: General: Alert, no acute distress Psychiatric: Patient is competent for consent with normal mood and affect Lymphatic: No axillary or cervical lymphadenopathy Cardiovascular: No pedal edema Respiratory: No cyanosis, no use of accessory musculature GI: No organomegaly, abdomen is soft and non-tender  Skin: Examination patient has no abrasions or skin breakdown.   Neurologic: Patient does not have protective sensation bilateral lower extremities.   MUSCULOSKELETAL:  Examination patient's right lower extremity is shortened and externally rotated. She has pain with range of motion  of the hip. Radiographs show no intertrochanteric hip fracture.  Assessment: Assessment: Right intertrochanteric hip fracture.  Plan: Plan: We will plan for intramedullary nail fixation of the hip fracture. I discussed this with the patient's daughter over the phone who is the power of attorney. Patient's daughter states she understands and wishes to proceed with surgery. I also discussed with the patient's sitter.  Thank you for the consult and the opportunity to see Kimberly Acevedo, [place] [phone] 7:01 AM

## 2016-10-31 NOTE — Progress Notes (Signed)
Hospitalists notified that patient has arrived from [facility]. No further orders given.

## 2016-10-31 NOTE — Anesthesia Procedure Notes (Signed)
Procedure Name: Intubation Date/Time: 10/31/2016 3:23 PM Performed by: Hollie Salk Z Pre-anesthesia Checklist: Patient identified, Emergency Drugs available, Suction available and Patient being monitored Patient Re-evaluated:Patient Re-evaluated prior to inductionOxygen Delivery Method: Circle System Utilized Preoxygenation: Pre-oxygenation with 100% oxygen Intubation Type: IV induction, Rapid sequence and Cricoid Pressure applied Ventilation: Mask ventilation without difficulty Laryngoscope Size: Mac and 3 Grade View: Grade I Tube type: Oral Number of attempts: 1 Airway Equipment and Method: Stylet and Oral airway Placement Confirmation: ETT inserted through vocal cords under direct vision,  positive ETCO2 and breath sounds checked- equal and bilateral Secured at: 22 cm Tube secured with: Tape Dental Injury: Teeth and Oropharynx as per pre-operative assessment

## 2016-10-31 NOTE — Op Note (Signed)
DATE OF SURGERY:  10/31/2016  TIME: 4:01 PM  PATIENT NAME:  Kimberly Acevedo     AGE: 83 y.o.  PRE-OPERATIVE DIAGNOSIS:  right hip fracture  POST-OPERATIVE DIAGNOSIS:  SAME  PROCEDURE:  INTRAMEDULLARY (IM) NAIL INTERTROCHANTRIC  SURGEON:  [name]  ASSISTANT:  none.  OPERATIVE IMPLANTS: Synthes trochanteric femoral nail with interlocking helical blade, locked proximally and distally.  PREOPERATIVE INDICATIONS:  [name] is a 83 y.o. year old who fell and suffered a hip fracture. She was brought into the ER and then admitted and optimized and then elected for surgical intervention.    The risks benefits and alternatives were discussed with the patient including but not limited to the risks of nonoperative treatment, versus surgical intervention including infection, bleeding, nerve injury, malunion, nonunion, hardware prominence, hardware failure, need for hardware removal, blood clots, cardiopulmonary complications, morbidity, mortality, among others, and they were willing to proceed.    OPERATIVE PROCEDURE:  The patient was brought to the operating room and placed in the supine position on the [place] fracture table. General anesthesia was administered. She was placed on the fracture table.  Closed reduction was performed under C-arm guidance. Time out was then performed after sterile prep with Duraprep and draped into a sterile field with the shower curtain. She received preoperative antibiotics.  Incision was made proximal to the greater trochanter. A guidewire was placed centered on the greater trochanter. Confirmation was made on AP and lateral views. The femoral canal was then reamed over the wire. Wire and reamer were removed and the nail was inserted.  A guidepin was placed through the jig into the femoral head close to the center center position of the femoral head. The helical screw length was measured the cortex was reamed and the helical screw was inserted. C-arm was used  to verify position of the spiral blade.  The compression screw proximally lock the spiral blade.  The distal interlock was placed using the jig.  Instruments were removed and final C-arm pictures AP and lateral were obtained.  The wounds were irrigated  and closed with Vicryl followed by staples and Mepilex dressing. The patient was awakened and returned to PACU in stable and satisfactory condition. There no complications. Weightbearing as tolerated.  [name]

## 2016-10-31 NOTE — Anesthesia Postprocedure Evaluation (Signed)
Anesthesia Post Note  Patient: Kimberly Acevedo  Procedure(s) Performed: Procedure(s) (LRB): INTRAMEDULLARY (IM) NAIL INTERTROCHANTRIC (Right)  Patient location during evaluation: PACU Anesthesia Type: General Level of consciousness: awake and alert Pain management: pain level controlled Vital Signs Assessment: post-procedure vital signs reviewed and stable Respiratory status: spontaneous breathing, nonlabored ventilation, respiratory function stable and patient connected to nasal cannula oxygen Cardiovascular status: blood pressure returned to baseline and stable Postop Assessment: no signs of nausea or vomiting Anesthetic complications: no       Last Vitals:  Vitals:   10/31/16 1700 10/31/16 1731  BP: (!) 170/62 (!) 154/65  Pulse: 99 95  Resp: 19 16  Temp:  36.4 C    Last Pain:  Vitals:   10/31/16 1731  TempSrc: Oral  PainSc:                  Undrea Shipes DAVID

## 2016-10-31 NOTE — Progress Notes (Signed)
Pt has refused cpap at this time.  Pt seems confused and states she "will not pay for it.'  Rt will monitor.

## 2016-10-31 NOTE — Transfer of Care (Signed)
Immediate Anesthesia Transfer of Care Note  Patient: Kimberly Acevedo  Procedure(s) Performed: Procedure(s): INTRAMEDULLARY (IM) NAIL INTERTROCHANTRIC (Right)  Patient Location: PACU  Anesthesia Type:General  Level of Consciousness: awake  Airway & Oxygen Therapy: Patient Spontanous Breathing and Patient connected to face mask oxygen  Post-op Assessment: Report given to RN and Post -op Vital signs reviewed and stable  Post vital signs: Reviewed and stable  Last Vitals:  Vitals:   10/31/16 0628 10/31/16 1402  BP: (!) 120/93 (!) 180/73  Pulse: 78 88  Resp: 18 19  Temp: 36.6 C 36.8 C    Last Pain:  Vitals:   10/31/16 1402  TempSrc: Oral  PainSc:          Complications: No apparent anesthesia complications

## 2016-10-31 NOTE — Anesthesia Preprocedure Evaluation (Addendum)
Anesthesia Evaluation  Patient identified by MRN, date of birth, ID band Patient awake and Patient confused    Reviewed: Allergy & Precautions, NPO status , Patient's Chart, lab work & pertinent test results  History of Anesthesia Complications Negative for: history of anesthetic complications  Airway Mallampati: II  TM Distance: >3 FB Neck ROM: Full    Dental  (+) Dental Advisory Given   Pulmonary sleep apnea and Continuous Positive Airway Pressure Ventilation ,    breath sounds clear to auscultation       Cardiovascular hypertension, Pt. on medications (-) angina Rhythm:Regular Rate:Normal     Neuro/Psych Severe dementia   GI/Hepatic Neg liver ROS, GERD  Controlled,  Endo/Other  negative endocrine ROS  Renal/GU negative Renal ROS     Musculoskeletal  (+) Arthritis , Fibromyalgia -  Abdominal (+) - obese,   Peds  Hematology negative hematology ROS (+)   Anesthesia Other Findings Breast cancer  Reproductive/Obstetrics                            Anesthesia Physical Anesthesia Plan  ASA: III  Anesthesia Plan: General   Post-op Pain Management:    Induction: Intravenous  Airway Management Planned: Oral ETT  Additional Equipment:   Intra-op Plan:   Post-operative Plan: Extubation in OR  Informed Consent: I have reviewed the patients History and Physical, chart, labs and discussed the procedure including the risks, benefits and alternatives for the proposed anesthesia with the patient or authorized representative who has indicated his/her understanding and acceptance.   Dental advisory given and Consent reviewed with POA  Plan Discussed with: CRNA and Surgeon  Anesthesia Plan Comments: (Discussed with daughter Mallie Darting, she understands pt will be intubated and ventilated perioperatively, and wants full resuscitation while in OR as well. Plan routine monitors, GETA  )         Anesthesia Quick Evaluation

## 2016-10-31 NOTE — Progress Notes (Signed)
SLP Cancellation Note  Patient Details Name: Kimberly Acevedo MRN: [id number] DOB: 1929-02-06   Cancelled treatment:       Reason Eval/Treat Not Completed: Medical issues which prohibited therapy. Pt NPO for procedure.   Kern Reap, MA, CCC-SLP 10/31/2016, 11:18 AM

## 2016-10-31 NOTE — Progress Notes (Signed)
Patient seen and examined  83 y.o. female with medical history significant for dementia with behavioral disturbance, GERD, hypertension, and OSA on CPAP, now presenting to the emergency department for evaluation of acute right hip pain following a fall.Radiographs of the right hip, pelvis, tib-fib, and knee are notable for displaced fracture involving the right femoral neck, intertrochanteric with angulation deformity and probable impaction at the fracture site. Patient is now scheduled for intramedullary nail fixation of the right hip fracture Discussed with private sitter by the bedside

## 2016-11-01 DIAGNOSIS — F0151 Vascular dementia with behavioral disturbance: Secondary | ICD-10-CM

## 2016-11-01 LAB — COMPREHENSIVE METABOLIC PANEL
ALK PHOS: 79 U/L (ref 38–126)
ALT: 19 U/L (ref 14–54)
AST: 38 U/L (ref 15–41)
Albumin: 3.2 g/dL — ABNORMAL LOW (ref 3.5–5.0)
Anion gap: 9 (ref 5–15)
BILIRUBIN TOTAL: 0.6 mg/dL (ref 0.3–1.2)
BUN: 12 mg/dL (ref 6–20)
CALCIUM: 8.6 mg/dL — AB (ref 8.9–10.3)
CO2: 28 mmol/L (ref 22–32)
CREATININE: 0.97 mg/dL (ref 0.44–1.00)
Chloride: 103 mmol/L (ref 101–111)
GFR calc non Af Amer: 51 mL/min — ABNORMAL LOW (ref >60)
GFR, EST AFRICAN AMERICAN: 60 mL/min — AB (ref >60)
Glucose, Bld: 108 mg/dL — ABNORMAL HIGH (ref 65–99)
Potassium: 3.6 mmol/L (ref 3.5–5.1)
Sodium: 140 mmol/L (ref 135–145)
TOTAL PROTEIN: 6.1 g/dL — AB (ref 6.5–8.1)

## 2016-11-01 LAB — GLUCOSE, CAPILLARY
GLUCOSE-CAPILLARY: 136 mg/dL — AB (ref 65–99)
GLUCOSE-CAPILLARY: 188 mg/dL — AB (ref 65–99)
GLUCOSE-CAPILLARY: 119 mg/dL — AB (ref 65–99)
Glucose-Capillary: 137 mg/dL — ABNORMAL HIGH (ref 65–99)
Glucose-Capillary: 126 mg/dL — ABNORMAL HIGH (ref 65–99)

## 2016-11-01 LAB — CBC
HEMATOCRIT: 37.4 % (ref 36.0–46.0)
HEMOGLOBIN: 12 g/dL (ref 12.0–15.0)
MCH: 30.2 pg (ref 26.0–34.0)
MCHC: 32.1 g/dL (ref 30.0–36.0)
MCV: 94.2 fL (ref 78.0–100.0)
Platelets: 179 10*3/uL (ref 150–400)
RBC: 3.97 MIL/uL (ref 3.87–5.11)
RDW: 13.1 % (ref 11.5–15.5)
WBC: 10 10*3/uL (ref 4.0–10.5)

## 2016-11-01 MED ORDER — WHITE PETROLATUM GEL
Status: AC
  Administered 2016-11-01: 1
  Filled 2016-11-01: qty 1

## 2016-11-01 MED ORDER — ASPIRIN 325 MG PO TABS
325.0000 mg | ORAL_TABLET | Freq: Every day | ORAL | Status: DC
  Administered 2016-11-01 – 2016-11-02 (×2): 325 mg via ORAL
  Filled 2016-11-01 (×2): qty 1

## 2016-11-01 MED ORDER — ASPIRIN 325 MG PO TABS: 325.0000 mg | ORAL_TABLET | Freq: Two times a day (BID) | ORAL | Status: DC

## 2016-11-01 MED ORDER — LOSARTAN POTASSIUM 50 MG PO TABS
50.0000 mg | ORAL_TABLET | Freq: Every day | ORAL | Status: DC
  Administered 2016-11-01 – 2016-11-02 (×2): 50 mg via ORAL
  Filled 2016-11-01 (×2): qty 1

## 2016-11-01 MED ORDER — FAMOTIDINE 20 MG PO TABS
20.0000 mg | ORAL_TABLET | Freq: Every day | ORAL | Status: DC
  Administered 2016-11-01: 20 mg via ORAL
  Filled 2016-11-01: qty 1

## 2016-11-01 NOTE — Evaluation (Signed)
Clinical/Bedside Swallow Evaluation Patient Details  Name: Kimberly Acevedo MRN: [id number] Date of Birth: May 16, 1929  Today's Date: 11/01/2016 Time: SLP Start Time (ACUTE ONLY): 7672 SLP Stop Time (ACUTE ONLY): 0823 SLP Time Calculation (min) (ACUTE ONLY): 12 min  Past Medical History:  Past Medical History:  Diagnosis Date  . Anal fissure   . Arthritis   . Bilateral edema of lower extremity   . Cognitive dysfunction    DUE TO DEMENTIA--  PER NEUROLOGIST NOTE SEVERE  . Dementia with behavioral disturbance    PER NEUROLOGIST NOTE PT ABLE TO DO ADL'S BUT UNABLE TO CARE FOR HOME AND NEEDED AND PT HAS 24 HOUR CARE  . Dyslipidemia   . Fibromyalgia   . GERD (gastroesophageal reflux disease)   . Hemorrhoids   . History of breast cancer oncologist-  dr Humphrey Rolls--- no recurrence   dx 05-19-2012  Stage I , Invasive Lobular/ LCIS & microinvasive ductal and DCIS (T1 A NX M0)--  s/p  left partial mastectomy 07-04-2012 and radiation  . History of diverticulitis of colon   . History of radiation therapy 08-10-2012 to 09-08-2012   Left breast 750 cGy in 3 sessions  . History of squamous cell carcinoma excision    NOSE  . Hypertension   . OSA on CPAP   . Prolapsed internal hemorrhoids, grade 3   . Varicose veins    Past Surgical History:  Past Surgical History:  Procedure Laterality Date  . BREAST LUMPECTOMY WITH NEEDLE LOCALIZATION  07/04/2012   Procedure: BREAST LUMPECTOMY WITH NEEDLE LOCALIZATION;  Surgeon: Edward Jolly, MD;  Location: [place];  Service: General;  Laterality: Left;  left  . CHOLECYSTECTOMY  2007  . COLONOSCOPY    . ESOPHAGEAL DILATION    . HEMORRHOID BANDING  Mar & Apr 2016  . HEMORRHOID SURGERY N/A 12/25/2014   Procedure: HEMORRHOIDOPEXY WITH ANAL EXAM;  Surgeon: Leighton Ruff, MD;  Location: [place];  Service: General;  Laterality: N/A;  . [place]    . TOTAL ABDOMINAL HYSTERECTOMY  1976  . VARICOSE VEIN SURGERY  2010   left lower extremity   HPI:  Kimberly Acevedo a 83 y.o.femalewith medical history significant for dementia with behavioral disturbance, GERD, hypertension, and OSA on CPAP, presenting to the emergency department for evaluation of acute right hip pain following a fall with surgical intervention 5/27. CXR no acute abnormalities.    Assessment / Plan / Recommendation Clinical Impression  Swallow mechanism intact. Pt  and caregiver deny prior oral/pharyngea/esophageal difficulty. No indiations of airway compromise. Continue regular texture and thin liquids, general swallow precautions. No further ST needed.  SLP Visit Diagnosis: Dysphagia, unspecified (R13.10)    Aspiration Risk  Mild aspiration risk    Diet Recommendation Regular;Thin liquid   Liquid Administration via: Cup;Straw Medication Administration: Whole meds with liquid Supervision: Patient able to self feed Postural Changes: Seated upright at 90 degrees;Remain upright for at least 30 minutes after po intake    Other  Recommendations Oral Care Recommendations: Oral care BID   Follow up Recommendations None      Frequency and Duration            Prognosis        Swallow Study   General HPI: Kimberly Acevedo a 83 y.o.femalewith medical history significant for dementia with behavioral disturbance, GERD, hypertension, and OSA on CPAP, presenting to the emergency department for evaluation of acute right hip pain following a fall with surgical intervention 5/27.  CXR no acute abnormalities.  Type of Study: Bedside Swallow Evaluation Previous Swallow Assessment:  (none) Diet Prior to this Study: Regular;Thin liquids Temperature Spikes Noted: Yes Respiratory Status: Room air History of Recent Intubation: Yes Length of Intubations (days):  (during surgery) Date extubated: 10/31/16 Behavior/Cognition: Alert;Cooperative;Pleasant mood;Requires cueing Oral Cavity Assessment: Within Functional Limits Oral Care Completed  by SLP: No Oral Cavity - Dentition: Adequate natural dentition Vision: Functional for self-feeding Self-Feeding Abilities: Able to feed self;Needs set up Patient Positioning: Upright in bed Baseline Vocal Quality: Normal Volitional Cough: Strong Volitional Swallow: Able to elicit    Oral/Motor/Sensory Function Overall Oral Motor/Sensory Function: Within functional limits   Ice Chips Ice chips: Not tested   Thin Liquid Thin Liquid: Within functional limits Presentation: Straw    Nectar Thick Nectar Thick Liquid: Not tested   Honey Thick Honey Thick Liquid: Not tested   Puree Puree: Not tested   Solid   GO   Solid: Within functional limits        Houston Siren 11/01/2016,8:29 AM  Orbie Pyo Colvin Caroli.[employer] [phone]

## 2016-11-01 NOTE — Progress Notes (Signed)
Pt stable during shift; PT/OT reported pressure ulcer but unable to assess as pt was sitting up in chair and unable see place. Pt sitting up in chair with caregiver at bedside and call light within reach. Reported off to oncoming [employer]. Delia Heady RN

## 2016-11-01 NOTE — Progress Notes (Signed)
[facility] PROGRESS NOTE  Kimberly Acevedo FWY:[id number] DOB: January 27, 1929 DOA: 10/30/2016   PCP: Deland Pretty, MD     Assessment/Plan: Principal Problem:   Closed right hip fracture, initial encounter [facility]) Active Problems:   Hypertension, essential, benign   Dementia with behavioral disturbance   GERD (gastroesophageal reflux disease)   OSA on CPAP   Hyperglycemia    83 y.o.femalewith medical history significant for dementia with behavioral disturbance, GERD, hypertension, and OSA on CPAP, now presenting to the emergency department for evaluation of acute right hip pain following a fall.Radiographs of the right hip, pelvis, tib-fib, and knee are notable for displaced fracture involving the right femoral neck, intertrochanteric with angulation deformity and probable impaction at the fracture site. Patient is now status post intramedullary nail fixation of the right hip fracture 5/27  Assessment and plan   1. Right hip fracture  - Pt presents following a fall onto her right side with no head-strike or LOC, but immediate and severe pain in right hip  - Radiographs reveal displaced intertrochanteric fracture of right femoral neck with angulation deformity and probable impaction at fracture site  - Orthopedic surgery is consulting and much appreciated  - Status post IM nail on 5/27 - No arrhythmia on EKG, CXR clear, INR wnl, good renal fxn, UA unremarkable   - Discussed with Dr. Sharol Given, recommend aspirin 325 mg a day for DVT prophylaxis - Based on the available data, Kimberly Acevedo presents a relatively-low (0.4%) estimated risk probability of perioperative MI or cardiac arrest per Melburn Hake al Will need SNF   2. Hypertension  - BP elevated in ED in setting of pain and anxiety, improved spontaneously  - Resume Cozaar - Treat with prn hydralazine IVP's for now   3. GERD - No EGD report on file     4. Dementia - Appears to be stable; pt able to contribute some to  history  - Family at bedside warned about high-incidence of delirium in these settings; will continue to reorient, reassure pt   5. OSA  - Tolerating CPAP at home, will continue qHS   6. Hyperglycemia - Continue sliding scale - Check CBG q4h while NPO and cover with low-intensity sliding-scale  -Hemoglobin A1c pending     DVT prophylaxsis defer to orthopedics  Code Status:  DO NOT RESUSCITATE    Family Communication: Discussed in detail with the patient, all imaging results, lab results explained to the patient   Disposition Plan:  Will need SNF     Consultants:  Orthopedics  Procedures:  None  Antibiotics: Anti-infectives    Start     Dose/Rate Route Frequency Ordered Stop   10/31/16 2100  clindamycin (CLEOCIN) IVPB 600 mg     600 mg 100 mL/hr over 30 Minutes Intravenous Every 6 hours 10/31/16 1704 11/01/16 0859   10/31/16 0830  clindamycin (CLEOCIN) IVPB 900 mg     900 mg 100 mL/hr over 30 Minutes Intravenous On call to O.R. 10/31/16 0817 10/31/16 1513         HPI/Subjective: Confused per sitter  Objective: Vitals:   10/31/16 1700 10/31/16 1731 10/31/16 2017 11/01/16 0300  BP: (!) 170/62 (!) 154/65 112/81 (!) 147/59  Pulse: 99 95 (!) 124 (!) 102  Resp: 19 16  17   Temp:  97.6 F (36.4 C) 99.1 F (37.3 C) 99 F (37.2 C)  TempSrc:  Oral Axillary Oral  SpO2: 93% 90% 94% (!) 72%  Weight:  Height:        Intake/Output Summary (Last 24 hours) at 11/01/16 0954 Last data filed at 10/31/16 1605  Gross per 24 hour  Intake           656.67 ml  Output               20 ml  Net           636.67 ml    Exam:  Examination:  General exam: Appears calm and comfortable  Respiratory system: Clear to auscultation. Respiratory effort normal. Cardiovascular system: S1 & S2 heard, RRR. No JVD, murmurs, rubs, gallops or clicks. No pedal edema. Gastrointestinal system: Abdomen is nondistended, soft and nontender. No organomegaly or masses felt. Normal  bowel sounds heard. Central nervous system: confused . No focal neurological deficits. Extremities: Symmetric 5 x 5 power. Skin: No rashes, lesions or ulcers Psychiatry:  Mood & affect appropriate.     Data Reviewed: I have personally reviewed following labs and imaging studies  Micro Results Recent Results (from the past 240 hour(s))  Surgical pcr screen     Status: None   Collection Time: 10/31/16  9:28 AM  Result Value Ref Range Status   MRSA, PCR NEGATIVE NEGATIVE Final   Staphylococcus aureus NEGATIVE NEGATIVE Final    Comment:        The Xpert SA Assay (FDA approved for NASAL specimens in patients over 27 years of age), is one component of a comprehensive surveillance program.  Test performance has been validated by [facility] for patients greater than or equal to 62 year old. It is not intended to diagnose infection nor to guide or monitor treatment.     Radiology Reports Dg Chest 1 View  Result Date: 10/30/2016 CLINICAL DATA:  Close right hip fracture, preoperative evaluation. History of left breast cancer and hypertension. EXAM: CHEST 1 VIEW COMPARISON:  Chest x-ray dated 08/30/2011. FINDINGS: Heart size and mediastinal contours are normal. Atherosclerotic changes noted at the aortic arch. Lungs are clear. Questionable small pneumothorax within the right upper lobe, more likely artifactual related to the overlying scapula. No pleural effusions seen. No acute or suspicious osseous finding. IMPRESSION: 1. Lucency along the lateral margin of the right upper lobe, questionable small pneumothorax versus artifactual related to the superimposed scapula. Recommend repeat chest x-ray, with additional lordotic view, to exclude pneumothorax. 2. Lungs are clear. 3. Aortic atherosclerosis. Electronically Signed   By: Franki Cabot M.D.   On: 10/30/2016 23:47   Dg Chest W/ Lordotic  Result Date: 10/31/2016 CLINICAL DATA:  Question of pneumothorax on prior chest radiograph.  Follow-up chest radiograph requested. EXAM: CHEST - 1 VIEW WITH LORDOTIC VIEW(S) COMPARISON:  Chest radiograph performed 10/30/2016 FINDINGS: The lungs are well-aerated and clear. There is no evidence of focal opacification, pleural effusion or pneumothorax. The near lucency overlying the right lung apex is no longer seen and likely reflected a skin fold. The cardiomediastinal silhouette is within normal limits. No acute osseous abnormalities are seen. IMPRESSION: No acute cardiopulmonary process seen.  No evidence of pneumothorax. Electronically Signed   By: Garald Balding M.D.   On: 10/31/2016 00:57   Dg Tibia/fibula Right  Result Date: 10/30/2016 CLINICAL DATA:  Pt c/o right leg pain s/p fall today at nursing home. Known fracture of right hip. H/o arthritis. *Best obtainable, pt in severe pain. EXAM: RIGHT TIBIA AND FIBULA - 2 VIEW COMPARISON:  None. FINDINGS: Osseous alignment is normal. No fracture line or displaced fracture fragment seen. Incidental note  of chondrocalcinosis within the medial and lateral knee compartments indicating underlying CPPD. IMPRESSION: 1. No acute findings. No osseous fracture or dislocation within the tibia or fibula. 2. Evidence of CPPD at the right knee. No osteophytes or other signs of secondary degenerative osteoarthritis. Electronically Signed   By: Franki Cabot M.D.   On: 10/30/2016 22:32   Ct Head Wo Contrast  Result Date: 10/30/2016 CLINICAL DATA:  Slipped and fell, no head injury or loss of consciousness. Vomiting. History of hypertension, dyslipidemia, dementia. EXAM: CT HEAD WITHOUT CONTRAST TECHNIQUE: Contiguous axial images were obtained from the base of the skull through the vertex without intravenous contrast. COMPARISON:  MRI of the head May 04, 2013 FINDINGS: Mildly motion degraded examination. BRAIN: No intraparenchymal hemorrhage, mass effect nor midline shift. The ventricles and sulci are normal for age, stable mildly asymmetrically enlarged LEFT  lateral ventricle. Patchy supratentorial white matter hypodensities less than expected for patient's age, though non-specific are most compatible with chronic small vessel ischemic disease. No acute large vascular territory infarcts. No abnormal extra-axial fluid collections. Basal cisterns are patent. VASCULAR: Moderate calcific atherosclerosis of the carotid siphons. SKULL: No skull fracture. No significant scalp soft tissue swelling. SINUSES/ORBITS: Small LEFT frontal mucosal retention cyst. No paranasal sinus air-fluid levels. Mastoid air cells are well aerated.The included ocular globes and orbital contents are non-suspicious. Status post bilateral ocular lens implants. OTHER: None. IMPRESSION: No acute intracranial process on this mildly motion degraded examination. Stable negative noncontrast CT HEAD for age. Electronically Signed   By: Elon Alas M.D.   On: 10/30/2016 22:22   Dg Knee Complete 4 Views Right  Result Date: 10/30/2016 CLINICAL DATA:  Pt c/o right leg pain s/p fall today in nursing home. Known fracture of right hip. H/o arthritis. *Best obtainable, pt in severe pain. EXAM: RIGHT KNEE - COMPLETE 4+ VIEW COMPARISON:  None. FINDINGS: Osseous alignment is normal. No fracture line or displaced fracture fragment seen. No appreciable joint effusion and adjacent soft tissues are unremarkable. Incidental note made of faint calcifications within the medial and lateral compartments, compatible with chondrocalcinosis, indicating underlying CPPD. IMPRESSION: 1. No acute findings.  No osseous fracture or dislocation. 2. Evidence of CPPD. No osteophytes or other signs of advanced degenerative osteoarthritis. Electronically Signed   By: Franki Cabot M.D.   On: 10/30/2016 22:34   Dg C-arm 1-60 Min  Result Date: 10/31/2016 CLINICAL DATA:  Right hip fracture EXAM: DG C-ARM 61-120 MIN; OPERATIVE RIGHT HIP WITH PELVIS COMPARISON:  10/30/2016 FLUOROSCOPY TIME:  Fluoroscopy Time:  18 seconds Radiation  Exposure Index (if provided by the fluoroscopic device): Not available Number of Acquired Spot Images: 2 FINDINGS: Medullary rod and fixation screw are noted in the proximal right femur. Fracture fragments are in near anatomic alignment. IMPRESSION: ORIF of proximal right femoral fracture. Electronically Signed   By: Inez Catalina M.D.   On: 10/31/2016 16:15   Dg Hip Operative Unilat With Pelvis Right  Result Date: 10/31/2016 CLINICAL DATA:  Right hip fracture EXAM: DG C-ARM 61-120 MIN; OPERATIVE RIGHT HIP WITH PELVIS COMPARISON:  10/30/2016 FLUOROSCOPY TIME:  Fluoroscopy Time:  18 seconds Radiation Exposure Index (if provided by the fluoroscopic device): Not available Number of Acquired Spot Images: 2 FINDINGS: Medullary rod and fixation screw are noted in the proximal right femur. Fracture fragments are in near anatomic alignment. IMPRESSION: ORIF of proximal right femoral fracture. Electronically Signed   By: Inez Catalina M.D.   On: 10/31/2016 16:15   Dg Hip Unilat  With Pelvis  2-3 Views Right  Result Date: 10/30/2016 CLINICAL DATA:  Right hip pain s/p slip and fall at nursing home pta. Hx arthritis, dementia, EXAM: DG HIP (WITH OR WITHOUT PELVIS) 2-3V RIGHT COMPARISON:  None. FINDINGS: Displaced fracture of the right femoral neck, intertrochanteric, with associated angulation deformity and probable impaction at the fracture site. Right femoral head remains well positioned relative to the acetabulum. No additional fracture or dislocation seen. Degenerative spurring noted within the slightly scoliotic lower lumbar spine. Soft tissues about the pelvis and right hip are unremarkable. IMPRESSION: Displaced fracture of the right femoral neck, intertrochanteric, with associated angulation deformity at the fracture site and probable impaction at the fracture site. Right femoral head remains well positioned relative to the acetabulum. Electronically Signed   By: Franki Cabot M.D.   On: 10/30/2016 21:42   Xr  Shoulder Right  Result Date: 10/13/2016 Internal and external rotated views of the right shoulder show a complex comminuted proximal humerus fracture with slight inferior subluxation.    CBC  Recent Labs Lab 10/30/16 2137 11/01/16 0603  WBC 10.9* 10.0  HGB 15.4* 12.0  HCT 44.8 37.4  PLT 228 179  MCV 91.4 94.2  MCH 31.4 30.2  MCHC 34.4 32.1  RDW 12.8 13.1    Chemistries   Recent Labs Lab 10/30/16 2137 11/01/16 0603  NA 140 140  K 3.6 3.6  CL 105 103  CO2 23 28  GLUCOSE 163* 108*  BUN 12 12  CREATININE 0.95 0.97  CALCIUM 9.4 8.6*  AST 27 38  ALT 19 19  ALKPHOS 100 79  BILITOT 0.6 0.6   ------------------------------------------------------------------------------------------------------------------ estimated creatinine clearance is 46.3 mL/min (by C-G formula based on SCr of 0.97 mg/dL). ------------------------------------------------------------------------------------------------------------------ No results for input(s): HGBA1C in the last 72 hours. ------------------------------------------------------------------------------------------------------------------ No results for input(s): CHOL, HDL, LDLCALC, TRIG, CHOLHDL, LDLDIRECT in the last 72 hours. ------------------------------------------------------------------------------------------------------------------ No results for input(s): TSH, T4TOTAL, T3FREE, THYROIDAB in the last 72 hours.  Invalid input(s): FREET3 ------------------------------------------------------------------------------------------------------------------ No results for input(s): VITAMINB12, FOLATE, FERRITIN, TIBC, IRON, RETICCTPCT in the last 72 hours.  Coagulation profile  Recent Labs Lab 10/30/16 2237  INR 1.01    No results for input(s): DDIMER in the last 72 hours.  Cardiac Enzymes No results for input(s): CKMB, TROPONINI, MYOGLOBIN in the last 168 hours.  Invalid input(s):  CK ------------------------------------------------------------------------------------------------------------------ Invalid input(s): POCBNP   CBG:  Recent Labs Lab 10/31/16 0635 10/31/16 1231 10/31/16 1728 11/01/16 0117 11/01/16 0447  GLUCAP 128* 133* 119* 136* 137*       Studies: Dg Chest 1 View  Result Date: 10/30/2016 CLINICAL DATA:  Close right hip fracture, preoperative evaluation. History of left breast cancer and hypertension. EXAM: CHEST 1 VIEW COMPARISON:  Chest x-ray dated 08/30/2011. FINDINGS: Heart size and mediastinal contours are normal. Atherosclerotic changes noted at the aortic arch. Lungs are clear. Questionable small pneumothorax within the right upper lobe, more likely artifactual related to the overlying scapula. No pleural effusions seen. No acute or suspicious osseous finding. IMPRESSION: 1. Lucency along the lateral margin of the right upper lobe, questionable small pneumothorax versus artifactual related to the superimposed scapula. Recommend repeat chest x-ray, with additional lordotic view, to exclude pneumothorax. 2. Lungs are clear. 3. Aortic atherosclerosis. Electronically Signed   By: Franki Cabot M.D.   On: 10/30/2016 23:47   Dg Chest W/ Lordotic  Result Date: 10/31/2016 CLINICAL DATA:  Question of pneumothorax on prior chest radiograph. Follow-up chest radiograph requested. EXAM: CHEST - 1 VIEW WITH LORDOTIC VIEW(S) COMPARISON:  Chest radiograph performed 10/30/2016 FINDINGS: The lungs are well-aerated and clear. There is no evidence of focal opacification, pleural effusion or pneumothorax. The near lucency overlying the right lung apex is no longer seen and likely reflected a skin fold. The cardiomediastinal silhouette is within normal limits. No acute osseous abnormalities are seen. IMPRESSION: No acute cardiopulmonary process seen.  No evidence of pneumothorax. Electronically Signed   By: Garald Balding M.D.   On: 10/31/2016 00:57   Dg Tibia/fibula  Right  Result Date: 10/30/2016 CLINICAL DATA:  Pt c/o right leg pain s/p fall today at nursing home. Known fracture of right hip. H/o arthritis. *Best obtainable, pt in severe pain. EXAM: RIGHT TIBIA AND FIBULA - 2 VIEW COMPARISON:  None. FINDINGS: Osseous alignment is normal. No fracture line or displaced fracture fragment seen. Incidental note of chondrocalcinosis within the medial and lateral knee compartments indicating underlying CPPD. IMPRESSION: 1. No acute findings. No osseous fracture or dislocation within the tibia or fibula. 2. Evidence of CPPD at the right knee. No osteophytes or other signs of secondary degenerative osteoarthritis. Electronically Signed   By: Franki Cabot M.D.   On: 10/30/2016 22:32   Ct Head Wo Contrast  Result Date: 10/30/2016 CLINICAL DATA:  Slipped and fell, no head injury or loss of consciousness. Vomiting. History of hypertension, dyslipidemia, dementia. EXAM: CT HEAD WITHOUT CONTRAST TECHNIQUE: Contiguous axial images were obtained from the base of the skull through the vertex without intravenous contrast. COMPARISON:  MRI of the head May 04, 2013 FINDINGS: Mildly motion degraded examination. BRAIN: No intraparenchymal hemorrhage, mass effect nor midline shift. The ventricles and sulci are normal for age, stable mildly asymmetrically enlarged LEFT lateral ventricle. Patchy supratentorial white matter hypodensities less than expected for patient's age, though non-specific are most compatible with chronic small vessel ischemic disease. No acute large vascular territory infarcts. No abnormal extra-axial fluid collections. Basal cisterns are patent. VASCULAR: Moderate calcific atherosclerosis of the carotid siphons. SKULL: No skull fracture. No significant scalp soft tissue swelling. SINUSES/ORBITS: Small LEFT frontal mucosal retention cyst. No paranasal sinus air-fluid levels. Mastoid air cells are well aerated.The included ocular globes and orbital contents are  non-suspicious. Status post bilateral ocular lens implants. OTHER: None. IMPRESSION: No acute intracranial process on this mildly motion degraded examination. Stable negative noncontrast CT HEAD for age. Electronically Signed   By: Elon Alas M.D.   On: 10/30/2016 22:22   Dg Knee Complete 4 Views Right  Result Date: 10/30/2016 CLINICAL DATA:  Pt c/o right leg pain s/p fall today in nursing home. Known fracture of right hip. H/o arthritis. *Best obtainable, pt in severe pain. EXAM: RIGHT KNEE - COMPLETE 4+ VIEW COMPARISON:  None. FINDINGS: Osseous alignment is normal. No fracture line or displaced fracture fragment seen. No appreciable joint effusion and adjacent soft tissues are unremarkable. Incidental note made of faint calcifications within the medial and lateral compartments, compatible with chondrocalcinosis, indicating underlying CPPD. IMPRESSION: 1. No acute findings.  No osseous fracture or dislocation. 2. Evidence of CPPD. No osteophytes or other signs of advanced degenerative osteoarthritis. Electronically Signed   By: Franki Cabot M.D.   On: 10/30/2016 22:34   Dg C-arm 1-60 Min  Result Date: 10/31/2016 CLINICAL DATA:  Right hip fracture EXAM: DG C-ARM 61-120 MIN; OPERATIVE RIGHT HIP WITH PELVIS COMPARISON:  10/30/2016 FLUOROSCOPY TIME:  Fluoroscopy Time:  18 seconds Radiation Exposure Index (if provided by the fluoroscopic device): Not available Number of Acquired Spot Images: 2 FINDINGS: Medullary rod and fixation screw are  noted in the proximal right femur. Fracture fragments are in near anatomic alignment. IMPRESSION: ORIF of proximal right femoral fracture. Electronically Signed   By: Inez Catalina M.D.   On: 10/31/2016 16:15   Dg Hip Operative Unilat With Pelvis Right  Result Date: 10/31/2016 CLINICAL DATA:  Right hip fracture EXAM: DG C-ARM 61-120 MIN; OPERATIVE RIGHT HIP WITH PELVIS COMPARISON:  10/30/2016 FLUOROSCOPY TIME:  Fluoroscopy Time:  18 seconds Radiation Exposure Index  (if provided by the fluoroscopic device): Not available Number of Acquired Spot Images: 2 FINDINGS: Medullary rod and fixation screw are noted in the proximal right femur. Fracture fragments are in near anatomic alignment. IMPRESSION: ORIF of proximal right femoral fracture. Electronically Signed   By: Inez Catalina M.D.   On: 10/31/2016 16:15   Dg Hip Unilat  With Pelvis 2-3 Views Right  Result Date: 10/30/2016 CLINICAL DATA:  Right hip pain s/p slip and fall at nursing home pta. Hx arthritis, dementia, EXAM: DG HIP (WITH OR WITHOUT PELVIS) 2-3V RIGHT COMPARISON:  None. FINDINGS: Displaced fracture of the right femoral neck, intertrochanteric, with associated angulation deformity and probable impaction at the fracture site. Right femoral head remains well positioned relative to the acetabulum. No additional fracture or dislocation seen. Degenerative spurring noted within the slightly scoliotic lower lumbar spine. Soft tissues about the pelvis and right hip are unremarkable. IMPRESSION: Displaced fracture of the right femoral neck, intertrochanteric, with associated angulation deformity at the fracture site and probable impaction at the fracture site. Right femoral head remains well positioned relative to the acetabulum. Electronically Signed   By: Franki Cabot M.D.   On: 10/30/2016 21:42      Lab Results  Component Value Date   HGBA1C 6.5 07/20/2016   Lab Results  Component Value Date   LDLCALC 107 (H) 05/25/2014   CREATININE 0.97 11/01/2016       Scheduled Meds: . aspirin EC  81 mg Oral Q breakfast  . cholecalciferol  1,000 Units Oral Daily  . insulin aspart  0-9 Units Subcutaneous Q4H  . sertraline  100 mg Oral Daily  . vitamin B-12  1,000 mcg Oral Daily   Continuous Infusions: . famotidine (PEPCID) IV Stopped (10/31/16 1024)     LOS: 2 days    Time spent: >30 MINS    Reyne Dumas  [facility] Pager [phone]. If 7PM-7AM, please contact night-coverage at  [email or web address], password [facility] 11/01/2016, 9:54 AM  LOS: 2 days

## 2016-11-01 NOTE — Evaluation (Signed)
Occupational Therapy Evaluation Patient Details Name: Kimberly Acevedo MRN: [id number] DOB: 12-11-1928 Today's Date: 11/01/2016    History of Present Illness 83 y.o. female with R hip pain following mechanical fall, s/p intratrochanteric IM nailing 5/27. Prior medical history significant for dementia with behavioral disturbance, R shoulder fx s/p fall 6 weeks ago, GERD, hypertension, and OSA on CPAP,     Clinical Impression   PTA, pt was living alone and has 24 hour assistance with caregivers. Pt received A for basic ADLs and occasionally used a cane for functional mobility. Currently, pt requires Max A +2 for LB ADLs and transfers. Pt would benefit from skilled acute OT to increase her occupational performance and participation.  Recommend dc to SNF for OT to increase her safety and independence with ADLs and functional mobility.     Follow Up Recommendations  SNF;Supervision/Assistance - 24 hour    Equipment Recommendations  Other (comment) (Defer to next venue)    Recommendations for Other Services PT consult     Precautions / Restrictions Precautions Precautions: Fall Precaution Comments: 2 fall in last 2 weeks Restrictions Weight Bearing Restrictions: Yes RLE Weight Bearing: Touchdown weight bearing      Mobility Bed Mobility Overal bed mobility: Needs Assistance Bed Mobility: Supine to Sit     Supine to sit: Mod assist;+2 for safety/equipment     General bed mobility comments: mod A to manage LEs to floor and to bring trunk to upright with care to not use R arm due to previous R shoulder fx   Transfers Overall transfer level: Needs assistance   Transfers: Stand Pivot Transfers   Stand pivot transfers: +2 physical assistance;Max assist       General transfer comment: maxAx2 1 for pivot and 1 to maintain weightbearing, didn't attempt use of RW as pt dementia prohibited RW training    Balance Overall balance assessment: Needs assistance Sitting-balance support:  Bilateral upper extremity supported Sitting balance-Leahy Scale: Poor Sitting balance - Comments: required assist with posterior lean due to increased hip pain Postural control: Posterior lean   Standing balance-Leahy Scale: Zero                             ADL either performed or assessed with clinical judgement   ADL Overall ADL's : Needs assistance/impaired Eating/Feeding: Set up;Sitting   Grooming: Sitting;Minimal assistance   Upper Body Bathing: Minimal assistance;Sitting   Lower Body Bathing: Maximal assistance;Sit to/from stand;+2 for physical assistance   Upper Body Dressing : Minimal assistance;Sitting   Lower Body Dressing: Maximal assistance;+2 for physical assistance;Sit to/from stand   Toilet Transfer: Maximal assistance;+2 for physical assistance;Squat-pivot (Simulated to recliner)   Toileting- Clothing Manipulation and Hygiene: Maximal assistance;+2 for physical assistance;Sit to/from stand       Functional mobility during ADLs: Maximal assistance;+2 for physical assistance       Vision         Perception     Praxis      Pertinent Vitals/Pain Pain Assessment: Faces Faces Pain Scale: Hurts little more Pain Location: R hip  Pain Descriptors / Indicators: Grimacing;Guarding Pain Intervention(s): Limited activity within patient's tolerance;Monitored during session;Repositioned     Hand Dominance Right   Extremity/Trunk Assessment Upper Extremity Assessment Upper Extremity Assessment: RUE deficits/detail RUE Deficits / Details: Pt fell 6 wks ago and broke her right shoulder. Pt has not been using because it is too pain to mobilize RUE: Unable to fully assess due to pain  RUE Coordination: decreased gross motor;decreased fine motor   Lower Extremity Assessment Lower Extremity Assessment: Defer to PT evaluation RLE Deficits / Details: R hip and knee ROM and strength could not be assesd secondary to pain RLE: Unable to fully assess due to  pain   Cervical / Trunk Assessment Cervical / Trunk Assessment: Kyphotic   Communication Communication Communication: No difficulties   Cognition Arousal/Alertness: Awake/alert Behavior During Therapy: WFL for tasks assessed/performed Overall Cognitive Status: Impaired/Different from baseline (reduced short term memory) Area of Impairment: Memory;Awareness;Orientation;Safety/judgement                 Orientation Level: Disoriented to;Time;Place;Situation   Memory: Decreased short-term memory   Safety/Judgement: Decreased awareness of safety Awareness: Emergent   General Comments: Pt with baseline cogntitive deficit of dementia. Caregiver reports that she has mroe confusion now than at baseline   General Comments  Pt had skin breakdaown on R scapula. Nursing notified     Exercises     Shoulder Instructions      Home Living Family/patient expects to be discharged to:: Private residence Living Arrangements: Alone;Other (Comment) (24 hour care givers) Available Help at Discharge: Available 24 hours/day;Personal care attendant Type of Home: House Home Access: Stairs to enter Entrance Stairs-Number of Steps: 6 Entrance Stairs-Rails: Right;Left (can't reach both) Home Layout: Two level;Able to live on main level with bedroom/bathroom     Bathroom Shower/Tub: Walk-in shower;Tub/shower Editor, commissioning: Standard     Home Equipment: Bedside commode;Cane - single point   Additional Comments: 2 caregivers on 12 hour shifts      Prior Functioning/Environment Level of Independence: Needs assistance  Gait / Transfers Assistance Needed: caregiver states min assist and cues to get pt to initiate movement ADL's / Homemaking Assistance Needed: Caregivers A with basic ADLs and perform IADLs and driving   Comments: occasional use of cane        OT Problem List: Decreased strength;Decreased range of motion;Decreased activity tolerance;Impaired balance (sitting  and/or standing);Decreased cognition;Decreased safety awareness;Decreased knowledge of use of DME or AE;Decreased knowledge of precautions;Pain;Impaired UE functional use      OT Treatment/Interventions: Self-care/ADL training;Therapeutic exercise;Energy conservation;DME and/or AE instruction;Therapeutic activities;Patient/family education    OT Goals(Current goals can be found in the care plan section) Acute Rehab OT Goals Patient Stated Goal: "I want to go to sleep" OT Goal Formulation: With patient Time For Goal Achievement: 11/15/16 Potential to Achieve Goals: Good ADL Goals Pt Will Perform Grooming: with set-up;with supervision;sitting Pt Will Perform Upper Body Dressing: with set-up;with supervision;with caregiver independent in assisting;sitting Pt Will Perform Lower Body Dressing: with max assist;sit to/from stand;with caregiver independent in assisting Pt Will Transfer to Toilet: with max assist;stand pivot transfer;bedside commode Pt Will Perform Toileting - Clothing Manipulation and hygiene: sit to/from stand;with mod assist  OT Frequency: Min 2X/week   Barriers to D/C:            Co-evaluation PT/OT/SLP Co-Evaluation/Treatment: Yes Reason for Co-Treatment: Necessary to address cognition/behavior during functional activity PT goals addressed during session: Mobility/safety with mobility OT goals addressed during session: ADL's and self-care      AM-PAC PT "6 Clicks" Daily Activity     Outcome Measure Help from another person eating meals?: None Help from another person taking care of personal grooming?: A Little Help from another person toileting, which includes using toliet, bedpan, or urinal?: A Lot Help from another person bathing (including washing, rinsing, drying)?: A Lot Help from another person to put on and taking  off regular upper body clothing?: A Lot Help from another person to put on and taking off regular lower body clothing?: A Lot 6 Click Score: 15    End of Session Equipment Utilized During Treatment: Gait belt Nurse Communication: Mobility status;Weight bearing status  Activity Tolerance: Patient limited by pain;Patient limited by fatigue;Other (comment) (Limited by decreased cognition) Patient left: in chair;with call bell/phone within reach;with chair alarm set;with family/visitor present  OT Visit Diagnosis: Unsteadiness on feet (R26.81);Muscle weakness (generalized) (M62.81);History of falling (Z91.81);Other abnormalities of gait and mobility (R26.89);Repeated falls (R29.6);Pain;Other symptoms and signs involving cognitive function Pain - Right/Left: Right Pain - part of body: Shoulder;Hip;Leg                Time: 1586-8257 OT Time Calculation (min): 34 min Charges:  OT General Charges $OT Visit: 1 Procedure OT Evaluation $OT Eval Low Complexity: 1 Procedure G-Codes:     Winter Jocelyn, OTR/L [phone]  [place] 11/01/2016, 2:05 PM

## 2016-11-01 NOTE — Progress Notes (Signed)
Initial Nutrition Assessment  DOCUMENTATION CODES:   Not applicable  INTERVENTION:   -Reinforced importance of adequate nutrition post hip fracture/surgery. Pt declined oral nutrition supplement at this time   NUTRITION DIAGNOSIS:   Increased nutrient needs related to acute illness as evidenced by estimated needs.  GOAL:   Patient will meet greater than or equal to 90% of their needs  MONITOR:   PO intake, Labs, Weight trends  REASON FOR ASSESSMENT:   Consult Hip fracture protocol  ASSESSMENT:    83 yo female admitted with right hip fracture. Pt with hx of dementia with behavioral disturbance, GERD,HTN, OSA  5/27 IM nail intertrochantric  Pt reports appetite improving but not as good as normal. Ate some breakfast this AM. Reports good appetite a home with no wt loss.   Abdomen distended/taut, no BM since admission, prn meds are ordered for constipation  Labs: reviewed Meds: vit B-12  Diet Order:  Diet regular Room service appropriate? Yes; Fluid consistency: Thin  Skin:  Wound (see comment) (surgical incision, no pressure ulcers)  Last BM:  no documented BM  Height:   Ht Readings from Last 1 Encounters:  10/31/16 5\' 8"  (1.727 m)    Weight:   Wt Readings from Last 1 Encounters:  10/31/16 177 lb (80.3 kg)    BMI:  Body mass index is 26.91 kg/m.  Estimated Nutritional Needs:   Kcal:  1650-1850 kcals  Protein:  83-93 g  Fluid:  >/= 1.6 L  EDUCATION NEEDS:   No education needs identified at this time  [place], [place], LDN [phone] Pager  [phone] Weekend/On-Call Pager

## 2016-11-01 NOTE — Progress Notes (Signed)
Patient ID: Kimberly Acevedo, female   DOB: [birth year], 83 y.o.   MRN: [id number] Patient is alert and oriented this morning no change in her affect. Postop day #1 intramedullary nail fixation for intertrochanteric hip fracture She denies any pain. Plan for discharge to skilled nursing facility touchdown weightbearing on the right.

## 2016-11-01 NOTE — Consult Note (Signed)
[facility] Nurse wound consult note Reason for Consult: Erythema to mid back.  Due to immobility, will protect this area and sacrum with silicone border foam dressing.  Risk of insult from pressure and moisture.  Patient is only able to transfer with lift and has just moved to chair.  Information from private caregiver, at bedside.    Wound type:pressure  Pressure Injury POA: No Drainage (amount, consistency, odor) none  Periwound:Keep skin clean and dry Dressing procedure/placement/frequency:Keep skin clean and dry  No briefs while in bed.  Silicone border foam to redness on mid back and sacrum/buttocks, if noted.  Change every three days and PRN soilage.  Will not follow at this time.  Please re-consult if needed.  Domenic Moras RN BSN [place] Pager [phone]

## 2016-11-01 NOTE — Progress Notes (Signed)
Patient refuses CPAP. RT will continue to monitor as needed.

## 2016-11-01 NOTE — Evaluation (Signed)
Physical Therapy Evaluation Patient Details Name: Kimberly Acevedo MRN: [id number] DOB: [birth year] Today's Date: 11/01/2016   History of Present Illness  83 y.o. female with R hip pain following mechanical fall, s/p intratrochanteric IM nailing 5/27. Prior medical history significant for dementia with behavioral disturbance, R shoulder fx s/p fall 6 weeks ago, GERD, hypertension, and OSA on CPAP,    Clinical Impression  Patient is s/p above surgery resulting in functional limitations due to the deficits listed below (see PT Problem List). Pt currently, modAx2 for bed mobility and maxAx2 for stand pivot transfer to recliner.  Patient will benefit from skilled PT to increase their independence and safety with mobility to allow discharge to the venue listed below.       Follow Up Recommendations SNF;Supervision/Assistance - 24 hour    Equipment Recommendations  Other (comment) (to be determined at next venue)    Recommendations for Other Services OT consult     Precautions / Restrictions Precautions Precautions: Fall Precaution Comments: 2 fall in last 2 weeks Restrictions Weight Bearing Restrictions: Yes RLE Weight Bearing: Touchdown weight bearing       Balance Overall balance assessment: Needs assistance Sitting-balance support: Bilateral upper extremity supported Sitting balance-Leahy Scale: Poor Sitting balance - Comments: required assist with posterior lean due to increased hip pain Postural control: Posterior lean   Standing balance-Leahy Scale: Zero                               Pertinent Vitals/Pain Pain Assessment: Faces Faces Pain Scale: Hurts little more Pain Location: R hip  Pain Descriptors / Indicators: Grimacing;Guarding Pain Intervention(s): Monitored during session;Repositioned  VSS    Home Living Family/patient expects to be discharged to:: Private residence Living Arrangements: Alone;Other (Comment) (24 hour care givers) Available Help at  Discharge: Available 24 hours/day;Personal care attendant Type of Home: House Home Access: Stairs to enter Entrance Stairs-Rails: Right;Left (can't reach both) Entrance Stairs-Number of Steps: 6 Home Layout: Two level;Able to live on main level with bedroom/bathroom Home Equipment: Bedside commode;Cane - single point Additional Comments: 2 caregivers on 12 hour shifts    Prior Function Level of Independence: Needs assistance   Gait / Transfers Assistance Needed: caregiver states min assist and cues to get pt to initiate movement  ADL's / Homemaking Assistance Needed: caregivers  Comments: occasional use of cane     Hand Dominance        Extremity/Trunk Assessment   Upper Extremity Assessment Upper Extremity Assessment: Defer to OT evaluation    Lower Extremity Assessment Lower Extremity Assessment: RLE deficits/detail;Generalized weakness RLE Deficits / Details: R hip and knee ROM and strength could not be assesd secondary to pain RLE: Unable to fully assess due to pain    Cervical / Trunk Assessment Cervical / Trunk Assessment: Kyphotic  Communication   Communication: No difficulties  Cognition Arousal/Alertness: Awake/alert Behavior During Therapy: WFL for tasks assessed/performed Overall Cognitive Status: Impaired/Different from baseline (reduced short term memory) Area of Impairment: Memory;Awareness;Orientation                 Orientation Level: Disoriented to;Time;Place;Situation   Memory: Decreased short-term memory                General Comments General comments (skin integrity, edema, etc.): Pt had skin breakdaown on R scapula. Nursing notified         Assessment/Plan    PT Assessment Patient needs continued PT services  PT Problem  List Decreased strength;Decreased range of motion;Decreased activity tolerance;Decreased balance;Decreased mobility;Decreased cognition;Decreased knowledge of use of DME;Decreased safety awareness;Decreased  knowledge of precautions;Pain       PT Treatment Interventions Gait training;Functional mobility training;Therapeutic activities;Therapeutic exercise;Balance training;Cognitive remediation;Patient/family education    PT Goals (Current goals can be found in the Care Plan section)  Acute Rehab PT Goals PT Goal Formulation: Patient unable to participate in goal setting Time For Goal Achievement: 11/15/16 Potential to Achieve Goals: Poor    Frequency Min 3X/week   Barriers to discharge Other (comment) (dementia )      Co-evaluation PT/OT/SLP Co-Evaluation/Treatment: Yes Reason for Co-Treatment: Necessary to address cognition/behavior during functional activity PT goals addressed during session: Mobility/safety with mobility;Proper use of DME OT goals addressed during session: ADL's and self-care;Proper use of Adaptive equipment and DME       AM-PAC PT "6 Clicks" Daily Activity  Outcome Measure Difficulty turning over in bed (including adjusting bedclothes, sheets and blankets)?: Total Difficulty moving from lying on back to sitting on the side of the bed? : Total Difficulty sitting down on and standing up from a chair with arms (e.g., wheelchair, bedside commode, etc,.)?: Total Help needed moving to and from a bed to chair (including a wheelchair)?: A Lot Help needed walking in hospital room?: Total Help needed climbing 3-5 steps with a railing? : Total 6 Click Score: 7    End of Session Equipment Utilized During Treatment: Gait belt Activity Tolerance: Patient limited by pain Patient left: in chair;with call bell/phone within reach;with chair alarm set;with family/visitor present Nurse Communication: Mobility status;Need for lift equipment;Weight bearing status;Precautions;Other (comment) (skin breakdown on R scapula) PT Visit Diagnosis: Unsteadiness on feet (R26.81);Other abnormalities of gait and mobility (R26.89);Repeated falls (R29.6);Muscle weakness (generalized)  (M62.81);History of falling (Z91.81);Pain (dementia) Pain - Right/Left: Right Pain - part of body: Hip    Time: 3810-1751 PT Time Calculation (min) (ACUTE ONLY): 26 min   Charges:   PT Evaluation $PT Eval Moderate Complexity: 1 Procedure     PT G Codes:        Ammie Warrick B. Migdalia Dk PT, DPT Acute Rehabilitation  [phone] Pager [phone]    [place] 11/01/2016, 1:36 PM

## 2016-11-02 ENCOUNTER — Encounter (HOSPITAL_COMMUNITY): Payer: Self-pay | Admitting: Orthopedic Surgery

## 2016-11-02 DIAGNOSIS — R278 Other lack of coordination: Secondary | ICD-10-CM | POA: Diagnosis not present

## 2016-11-02 DIAGNOSIS — R2681 Unsteadiness on feet: Secondary | ICD-10-CM | POA: Diagnosis not present

## 2016-11-02 DIAGNOSIS — S72141D Displaced intertrochanteric fracture of right femur, subsequent encounter for closed fracture with routine healing: Secondary | ICD-10-CM | POA: Diagnosis not present

## 2016-11-02 DIAGNOSIS — L8990 Pressure ulcer of unspecified site, unspecified stage: Secondary | ICD-10-CM | POA: Insufficient documentation

## 2016-11-02 DIAGNOSIS — R1311 Dysphagia, oral phase: Secondary | ICD-10-CM | POA: Diagnosis not present

## 2016-11-02 DIAGNOSIS — I10 Essential (primary) hypertension: Secondary | ICD-10-CM | POA: Diagnosis not present

## 2016-11-02 DIAGNOSIS — S92153A Displaced avulsion fracture (chip fracture) of unspecified talus, initial encounter for closed fracture: Secondary | ICD-10-CM | POA: Diagnosis not present

## 2016-11-02 DIAGNOSIS — M6281 Muscle weakness (generalized): Secondary | ICD-10-CM | POA: Diagnosis not present

## 2016-11-02 DIAGNOSIS — M25551 Pain in right hip: Secondary | ICD-10-CM | POA: Diagnosis not present

## 2016-11-02 DIAGNOSIS — F0151 Vascular dementia with behavioral disturbance: Secondary | ICD-10-CM | POA: Diagnosis not present

## 2016-11-02 DIAGNOSIS — F0391 Unspecified dementia with behavioral disturbance: Secondary | ICD-10-CM | POA: Diagnosis not present

## 2016-11-02 DIAGNOSIS — R41841 Cognitive communication deficit: Secondary | ICD-10-CM | POA: Diagnosis not present

## 2016-11-02 DIAGNOSIS — Z4889 Encounter for other specified surgical aftercare: Secondary | ICD-10-CM | POA: Diagnosis not present

## 2016-11-02 DIAGNOSIS — G4733 Obstructive sleep apnea (adult) (pediatric): Secondary | ICD-10-CM | POA: Diagnosis not present

## 2016-11-02 DIAGNOSIS — S79911A Unspecified injury of right hip, initial encounter: Secondary | ICD-10-CM | POA: Diagnosis not present

## 2016-11-02 DIAGNOSIS — S72001A Fracture of unspecified part of neck of right femur, initial encounter for closed fracture: Secondary | ICD-10-CM | POA: Diagnosis not present

## 2016-11-02 DIAGNOSIS — M25511 Pain in right shoulder: Secondary | ICD-10-CM | POA: Diagnosis not present

## 2016-11-02 LAB — BASIC METABOLIC PANEL
ANION GAP: 8 (ref 5–15)
BUN: 10 mg/dL (ref 6–20)
CHLORIDE: 103 mmol/L (ref 101–111)
CO2: 28 mmol/L (ref 22–32)
Calcium: 8.6 mg/dL — ABNORMAL LOW (ref 8.9–10.3)
Creatinine, Ser: 0.75 mg/dL (ref 0.44–1.00)
GFR calc non Af Amer: >60 mL/min (ref >60)
Glucose, Bld: 131 mg/dL — ABNORMAL HIGH (ref 65–99)
Potassium: 3.9 mmol/L (ref 3.5–5.1)
Sodium: 139 mmol/L (ref 135–145)

## 2016-11-02 LAB — CBC
HEMATOCRIT: 37.6 % (ref 36.0–46.0)
HEMOGLOBIN: 11.9 g/dL — AB (ref 12.0–15.0)
MCH: 30.1 pg (ref 26.0–34.0)
MCHC: 31.6 g/dL (ref 30.0–36.0)
MCV: 95.2 fL (ref 78.0–100.0)
Platelets: 158 10*3/uL (ref 150–400)
RBC: 3.95 MIL/uL (ref 3.87–5.11)
RDW: 13 % (ref 11.5–15.5)
WBC: 8.9 10*3/uL (ref 4.0–10.5)

## 2016-11-02 LAB — GLUCOSE, CAPILLARY
GLUCOSE-CAPILLARY: 203 mg/dL — AB (ref 65–99)
Glucose-Capillary: 147 mg/dL — ABNORMAL HIGH (ref 65–99)
Glucose-Capillary: 150 mg/dL — ABNORMAL HIGH (ref 65–99)

## 2016-11-02 LAB — HEMOGLOBIN A1C
Hgb A1c MFr Bld: 6.3 % — ABNORMAL HIGH (ref 4.8–5.6)
Mean Plasma Glucose: 134 mg/dL

## 2016-11-02 MED ORDER — ASPIRIN 325 MG PO TABS: 325.0000 mg | ORAL_TABLET | Freq: Every day | ORAL | 1 refills | Status: DC

## 2016-11-02 MED ORDER — POLYETHYLENE GLYCOL 3350 17 G PO PACK: 17.0000 g | PACK | Freq: Every day | ORAL | 0 refills | Status: AC | PRN

## 2016-11-02 MED ORDER — LOSARTAN POTASSIUM 50 MG PO TABS: 50.0000 mg | ORAL_TABLET | Freq: Every day | ORAL | 1 refills | Status: AC

## 2016-11-02 MED ORDER — BISACODYL 5 MG PO TBEC: 5.0000 mg | DELAYED_RELEASE_TABLET | Freq: Every day | ORAL | 0 refills | Status: AC | PRN

## 2016-11-02 NOTE — Social Work (Signed)
CSW faxed off clinicals this morning to [facility]. Awaiting Auth. Patient selects bed at [facility]. Facility aware and has offered bed.

## 2016-11-02 NOTE — Discharge Summary (Addendum)
Physician Discharge Summary  Kimberly Acevedo MRN: [id number] DOB/AGE: [birth year] 83 y.o.  PCP: Deland Pretty, MD   Admit date: 10/30/2016 Discharge date: 11/02/2016  Discharge Diagnoses:    Principal Problem:   Closed right hip fracture, initial encounter [facility]) Active Problems:   Hypertension, essential, benign   Dementia with behavioral disturbance   GERD (gastroesophageal reflux disease)   OSA on CPAP   Hyperglycemia   Pressure injury of skin    Follow-up recommendations Follow-up with PCP in 3-5 days , including all  additional recommended appointments as below Follow-up CBC, CMP in 3-5 days Use fall precautions, aspiration precautions      Current Discharge Medication List    START taking these medications   Details  acetaminophen (TYLENOL) 500 MG tablet Take 1 tablet (500 mg total) by mouth every 6 (six) hours as needed for mild pain. Qty: 30 tablet, Refills: 0    aspirin 325 MG tablet Take 1 tablet (325 mg total) by mouth daily. Qty: 30 tablet, Refills: 1    bisacodyl (DULCOLAX) 5 MG EC tablet Take 1 tablet (5 mg total) by mouth daily as needed for moderate constipation. Qty: 30 tablet, Refills: 0    polyethylene glycol (MIRALAX / GLYCOLAX) packet Take 17 g by mouth daily as needed for mild constipation. Qty: 14 each, Refills: 0      CONTINUE these medications which have CHANGED   Details  losartan (COZAAR) 50 MG tablet Take 1 tablet (50 mg total) by mouth daily. Qty: 30 tablet, Refills: 1      CONTINUE these medications which have NOT CHANGED   Details  Cholecalciferol (VITAMIN D) 2000 UNITS CAPS Take 1 capsule by mouth daily.    sertraline (ZOLOFT) 100 MG tablet Take 100 mg by mouth daily. Refills: 5    vitamin B-12 (CYANOCOBALAMIN) 1000 MCG tablet Take 1,000 mcg by mouth daily.      STOP taking these medications     naproxen sodium (ANAPROX) 220 MG tablet      oxyCODONE-acetaminophen (PERCOCET) 5-325 MG tablet          Discharge  Condition: Stable *  Discharge Instructions Get Medicines reviewed and adjusted: Please take all your medications with you for your next visit with your Primary MD  Please request your Primary MD to go over all hospital tests and procedure/radiological results at the follow up, please ask your Primary MD to get all Hospital records sent to his/her office.  If you experience worsening of your admission symptoms, develop shortness of breath, life threatening emergency, suicidal or homicidal thoughts you must seek medical attention immediately by calling 911 or calling your MD immediately if symptoms less severe.  You must read complete instructions/literature along with all the possible adverse reactions/side effects for all the Medicines you take and that have been prescribed to you. Take any new Medicines after you have completely understood and accpet all the possible adverse reactions/side effects.   Do not drive when taking Pain medications.   Do not take more than prescribed Pain, Sleep and Anxiety Medications  Special Instructions: If you have smoked or chewed Tobacco in the last 2 yrs please stop smoking, stop any regular Alcohol and or any Recreational drug use.  Wear Seat belts while driving.  Please note  You were cared for by a hospitalist during your hospital stay. Once you are discharged, your primary care physician will handle any further medical issues. Please note that NO REFILLS for any discharge medications will be authorized once  you are discharged, as it is imperative that you return to your primary care physician (or establish a relationship with a primary care physician if you do not have one) for your aftercare needs so that they can reassess your need for medications and monitor your lab values.  Discharge Instructions    Touch down weight bearing    Complete by:  As directed        Allergies  Allergen Reactions  . Penicillins Swelling and Rash    Has  patient had a PCN reaction causing immediate rash, facial/tongue/throat swelling, SOB or lightheadedness with hypotension: No Has patient had a PCN reaction causing severe rash involving mucus membranes or skin necrosis: No Has patient had a PCN reaction that required hospitalization No Has patient had a PCN reaction occurring within the last 10 years: No If all of the above answers are "NO", then may proceed with Cephalosporin use.  . Statins   . Sulfa Drugs Cross Reactors Shortness Of Breath  . Tramadol Hcl Anaphylaxis  . Aspirin Swelling  . Codeine Other (See Comments)    unknown  . Morphine And Related Other (See Comments)    Hypoglycemia      Disposition: 01-Home or Self Care   Consults:  Orthopedics    Significant Diagnostic Studies:  Dg Chest 1 View  Result Date: 10/30/2016 CLINICAL DATA:  Close right hip fracture, preoperative evaluation. History of left breast cancer and hypertension. EXAM: CHEST 1 VIEW COMPARISON:  Chest x-ray dated 08/30/2011. FINDINGS: Heart size and mediastinal contours are normal. Atherosclerotic changes noted at the aortic arch. Lungs are clear. Questionable small pneumothorax within the right upper lobe, more likely artifactual related to the overlying scapula. No pleural effusions seen. No acute or suspicious osseous finding. IMPRESSION: 1. Lucency along the lateral margin of the right upper lobe, questionable small pneumothorax versus artifactual related to the superimposed scapula. Recommend repeat chest x-ray, with additional lordotic view, to exclude pneumothorax. 2. Lungs are clear. 3. Aortic atherosclerosis. Electronically Signed   By: Franki Cabot M.D.   On: 10/30/2016 23:47   Dg Chest W/ Lordotic  Result Date: 10/31/2016 CLINICAL DATA:  Question of pneumothorax on prior chest radiograph. Follow-up chest radiograph requested. EXAM: CHEST - 1 VIEW WITH LORDOTIC VIEW(S) COMPARISON:  Chest radiograph performed 10/30/2016 FINDINGS: The lungs are  well-aerated and clear. There is no evidence of focal opacification, pleural effusion or pneumothorax. The near lucency overlying the right lung apex is no longer seen and likely reflected a skin fold. The cardiomediastinal silhouette is within normal limits. No acute osseous abnormalities are seen. IMPRESSION: No acute cardiopulmonary process seen.  No evidence of pneumothorax. Electronically Signed   By: Garald Balding M.D.   On: 10/31/2016 00:57   Dg Tibia/fibula Right  Result Date: 10/30/2016 CLINICAL DATA:  Pt c/o right leg pain s/p fall today at nursing home. Known fracture of right hip. H/o arthritis. *Best obtainable, pt in severe pain. EXAM: RIGHT TIBIA AND FIBULA - 2 VIEW COMPARISON:  None. FINDINGS: Osseous alignment is normal. No fracture line or displaced fracture fragment seen. Incidental note of chondrocalcinosis within the medial and lateral knee compartments indicating underlying CPPD. IMPRESSION: 1. No acute findings. No osseous fracture or dislocation within the tibia or fibula. 2. Evidence of CPPD at the right knee. No osteophytes or other signs of secondary degenerative osteoarthritis. Electronically Signed   By: Franki Cabot M.D.   On: 10/30/2016 22:32   Ct Head Wo Contrast  Result Date: 10/30/2016 CLINICAL DATA:  Slipped and fell, no head injury or loss of consciousness. Vomiting. History of hypertension, dyslipidemia, dementia. EXAM: CT HEAD WITHOUT CONTRAST TECHNIQUE: Contiguous axial images were obtained from the base of the skull through the vertex without intravenous contrast. COMPARISON:  MRI of the head May 04, 2013 FINDINGS: Mildly motion degraded examination. BRAIN: No intraparenchymal hemorrhage, mass effect nor midline shift. The ventricles and sulci are normal for age, stable mildly asymmetrically enlarged LEFT lateral ventricle. Patchy supratentorial white matter hypodensities less than expected for patient's age, though non-specific are most compatible with chronic  small vessel ischemic disease. No acute large vascular territory infarcts. No abnormal extra-axial fluid collections. Basal cisterns are patent. VASCULAR: Moderate calcific atherosclerosis of the carotid siphons. SKULL: No skull fracture. No significant scalp soft tissue swelling. SINUSES/ORBITS: Small LEFT frontal mucosal retention cyst. No paranasal sinus air-fluid levels. Mastoid air cells are well aerated.The included ocular globes and orbital contents are non-suspicious. Status post bilateral ocular lens implants. OTHER: None. IMPRESSION: No acute intracranial process on this mildly motion degraded examination. Stable negative noncontrast CT HEAD for age. Electronically Signed   By: Elon Alas M.D.   On: 10/30/2016 22:22   Dg Knee Complete 4 Views Right  Result Date: 10/30/2016 CLINICAL DATA:  Pt c/o right leg pain s/p fall today in nursing home. Known fracture of right hip. H/o arthritis. *Best obtainable, pt in severe pain. EXAM: RIGHT KNEE - COMPLETE 4+ VIEW COMPARISON:  None. FINDINGS: Osseous alignment is normal. No fracture line or displaced fracture fragment seen. No appreciable joint effusion and adjacent soft tissues are unremarkable. Incidental note made of faint calcifications within the medial and lateral compartments, compatible with chondrocalcinosis, indicating underlying CPPD. IMPRESSION: 1. No acute findings.  No osseous fracture or dislocation. 2. Evidence of CPPD. No osteophytes or other signs of advanced degenerative osteoarthritis. Electronically Signed   By: Franki Cabot M.D.   On: 10/30/2016 22:34   Dg C-arm 1-60 Min  Result Date: 10/31/2016 CLINICAL DATA:  Right hip fracture EXAM: DG C-ARM 61-120 MIN; OPERATIVE RIGHT HIP WITH PELVIS COMPARISON:  10/30/2016 FLUOROSCOPY TIME:  Fluoroscopy Time:  18 seconds Radiation Exposure Index (if provided by the fluoroscopic device): Not available Number of Acquired Spot Images: 2 FINDINGS: Medullary rod and fixation screw are noted in  the proximal right femur. Fracture fragments are in near anatomic alignment. IMPRESSION: ORIF of proximal right femoral fracture. Electronically Signed   By: Inez Catalina M.D.   On: 10/31/2016 16:15   Dg Hip Operative Unilat With Pelvis Right  Result Date: 10/31/2016 CLINICAL DATA:  Right hip fracture EXAM: DG C-ARM 61-120 MIN; OPERATIVE RIGHT HIP WITH PELVIS COMPARISON:  10/30/2016 FLUOROSCOPY TIME:  Fluoroscopy Time:  18 seconds Radiation Exposure Index (if provided by the fluoroscopic device): Not available Number of Acquired Spot Images: 2 FINDINGS: Medullary rod and fixation screw are noted in the proximal right femur. Fracture fragments are in near anatomic alignment. IMPRESSION: ORIF of proximal right femoral fracture. Electronically Signed   By: Inez Catalina M.D.   On: 10/31/2016 16:15   Dg Hip Unilat  With Pelvis 2-3 Views Right  Result Date: 10/30/2016 CLINICAL DATA:  Right hip pain s/p slip and fall at nursing home pta. Hx arthritis, dementia, EXAM: DG HIP (WITH OR WITHOUT PELVIS) 2-3V RIGHT COMPARISON:  None. FINDINGS: Displaced fracture of the right femoral neck, intertrochanteric, with associated angulation deformity and probable impaction at the fracture site. Right femoral head remains well positioned relative to the acetabulum. No additional fracture or dislocation  seen. Degenerative spurring noted within the slightly scoliotic lower lumbar spine. Soft tissues about the pelvis and right hip are unremarkable. IMPRESSION: Displaced fracture of the right femoral neck, intertrochanteric, with associated angulation deformity at the fracture site and probable impaction at the fracture site. Right femoral head remains well positioned relative to the acetabulum. Electronically Signed   By: Franki Cabot M.D.   On: 10/30/2016 21:42   Xr Shoulder Right  Result Date: 10/13/2016 Internal and external rotated views of the right shoulder show a complex comminuted proximal humerus fracture with slight  inferior subluxation.      Filed Weights   10/31/16 0025  Weight: 80.3 kg (177 lb)     Microbiology: Recent Results (from the past 240 hour(s))  Surgical pcr screen     Status: None   Collection Time: 10/31/16  9:28 AM  Result Value Ref Range Status   MRSA, PCR NEGATIVE NEGATIVE Final   Staphylococcus aureus NEGATIVE NEGATIVE Final    Comment:        The Xpert SA Assay (FDA approved for NASAL specimens in patients over 24 years of age), is one component of a comprehensive surveillance program.  Test performance has been validated by [facility] for patients greater than or equal to 65 year old. It is not intended to diagnose infection nor to guide or monitor treatment.        Blood Culture    Component Value Date/Time   SDES URINE, RANDOM 01/11/2015 1038   SPECREQUEST NONE 01/11/2015 1038   CULT  01/11/2015 1038    >=100,000 COLONIES/mL PROTEUS MIRABILIS Performed at [place] 01/15/2015 FINAL 01/11/2015 1038      Labs: Results for orders placed or performed during the hospital encounter of 10/30/16 (from the past 48 hour(s))  Surgical pcr screen     Status: None   Collection Time: 10/31/16  9:28 AM  Result Value Ref Range   MRSA, PCR NEGATIVE NEGATIVE   Staphylococcus aureus NEGATIVE NEGATIVE    Comment:        The Xpert SA Assay (FDA approved for NASAL specimens in patients over 38 years of age), is one component of a comprehensive surveillance program.  Test performance has been validated by [facility] for patients greater than or equal to 67 year old. It is not intended to diagnose infection nor to guide or monitor treatment.   Glucose, capillary     Status: Abnormal   Collection Time: 10/31/16 12:31 PM  Result Value Ref Range   Glucose-Capillary 133 (H) 65 - 99 mg/dL  Glucose, capillary     Status: Abnormal   Collection Time: 10/31/16  5:28 PM  Result Value Ref Range   Glucose-Capillary 119 (H) 65 - 99 mg/dL   Glucose, capillary     Status: Abnormal   Collection Time: 11/01/16  1:17 AM  Result Value Ref Range   Glucose-Capillary 136 (H) 65 - 99 mg/dL  Glucose, capillary     Status: Abnormal   Collection Time: 11/01/16  4:47 AM  Result Value Ref Range   Glucose-Capillary 137 (H) 65 - 99 mg/dL  CBC     Status: None   Collection Time: 11/01/16  6:03 AM  Result Value Ref Range   WBC 10.0 4.0 - 10.5 K/uL   RBC 3.97 3.87 - 5.11 MIL/uL   Hemoglobin 12.0 12.0 - 15.0 g/dL   HCT 37.4 36.0 - 46.0 %   MCV 94.2 78.0 - 100.0 fL  MCH 30.2 26.0 - 34.0 pg   MCHC 32.1 30.0 - 36.0 g/dL   RDW 13.1 11.5 - 15.5 %   Platelets 179 150 - 400 K/uL  Comprehensive metabolic panel     Status: Abnormal   Collection Time: 11/01/16  6:03 AM  Result Value Ref Range   Sodium 140 135 - 145 mmol/L   Potassium 3.6 3.5 - 5.1 mmol/L   Chloride 103 101 - 111 mmol/L   CO2 28 22 - 32 mmol/L   Glucose, Bld 108 (H) 65 - 99 mg/dL   BUN 12 6 - 20 mg/dL   Creatinine, Ser 0.97 0.44 - 1.00 mg/dL   Calcium 8.6 (L) 8.9 - 10.3 mg/dL   Total Protein 6.1 (L) 6.5 - 8.1 g/dL   Albumin 3.2 (L) 3.5 - 5.0 g/dL   AST 38 15 - 41 U/L   ALT 19 14 - 54 U/L   Alkaline Phosphatase 79 38 - 126 U/L   Total Bilirubin 0.6 0.3 - 1.2 mg/dL   GFR calc non Af Amer 51 (L) >60 mL/min   GFR calc Af Amer 60 (L) >60 mL/min    Comment: (NOTE) The eGFR has been calculated using the CKD EPI equation. This calculation has not been validated in all clinical situations. eGFR's persistently <60 mL/min signify possible Chronic Kidney Disease.    Anion gap 9 5 - 15  Glucose, capillary     Status: Abnormal   Collection Time: 11/01/16 12:21 PM  Result Value Ref Range   Glucose-Capillary 126 (H) 65 - 99 mg/dL  Glucose, capillary     Status: Abnormal   Collection Time: 11/01/16  5:21 PM  Result Value Ref Range   Glucose-Capillary 188 (H) 65 - 99 mg/dL  Glucose, capillary     Status: Abnormal   Collection Time: 11/01/16 10:12 PM  Result Value Ref Range    Glucose-Capillary 119 (H) 65 - 99 mg/dL  Glucose, capillary     Status: Abnormal   Collection Time: 11/02/16 12:55 AM  Result Value Ref Range   Glucose-Capillary 147 (H) 65 - 99 mg/dL  CBC     Status: Abnormal   Collection Time: 11/02/16  5:42 AM  Result Value Ref Range   WBC 8.9 4.0 - 10.5 K/uL   RBC 3.95 3.87 - 5.11 MIL/uL   Hemoglobin 11.9 (L) 12.0 - 15.0 g/dL   HCT 37.6 36.0 - 46.0 %   MCV 95.2 78.0 - 100.0 fL   MCH 30.1 26.0 - 34.0 pg   MCHC 31.6 30.0 - 36.0 g/dL   RDW 13.0 11.5 - 15.5 %   Platelets 158 150 - 400 K/uL  Basic metabolic panel     Status: Abnormal   Collection Time: 11/02/16  5:42 AM  Result Value Ref Range   Sodium 139 135 - 145 mmol/L   Potassium 3.9 3.5 - 5.1 mmol/L   Chloride 103 101 - 111 mmol/L   CO2 28 22 - 32 mmol/L   Glucose, Bld 131 (H) 65 - 99 mg/dL   BUN 10 6 - 20 mg/dL   Creatinine, Ser 0.75 0.44 - 1.00 mg/dL   Calcium 8.6 (L) 8.9 - 10.3 mg/dL   GFR calc non Af Amer >60 >60 mL/min   GFR calc Af Amer >60 >60 mL/min    Comment: (NOTE) The eGFR has been calculated using the CKD EPI equation. This calculation has not been validated in all clinical situations. eGFR's persistently <60 mL/min signify possible Chronic Kidney Disease.  Anion gap 8 5 - 15  Glucose, capillary     Status: Abnormal   Collection Time: 11/02/16  7:10 AM  Result Value Ref Range   Glucose-Capillary 150 (H) 65 - 99 mg/dL     Lipid Panel     Component Value Date/Time   CHOL 176 05/25/2014 0515   TRIG 173 (H) 05/25/2014 0515   HDL 34 (L) 05/25/2014 0515   CHOLHDL 5.2 05/25/2014 0515   VLDL 35 05/25/2014 0515   LDLCALC 107 (H) 05/25/2014 0515     Lab Results  Component Value Date   HGBA1C 6.5 07/20/2016     Lab Results  Component Value Date   LDLCALC 107 (H) 05/25/2014   CREATININE 0.75 11/02/2016     HPI :  83 y.o.femalewith medical history significant for dementia with behavioral disturbance, GERD, hypertension, and OSA on CPAP, now presenting to  the emergency department for evaluation of acute right hip pain following a fall.Radiographs of the right hip, pelvis, tib-fib, and knee are notable for displaced fracture involving the right femoral neck, intertrochanteric with angulation deformity and probable impaction at the fracture site. Patient is now status post intramedullary nail fixation of therighthip fracture 5/27  HOSPITAL COURSE:    1. Right hip fracture  - Pt presents following a fall onto her right side with no head-strike or LOC, but immediate and severe pain in right hip  - Radiographs reveal displaced intertrochanteric fracture of right femoral neck with angulation deformity and probable impaction at fracture site  - Orthopedic surgery Was consulted - Status post IM nail on 5/27 - No arrhythmia on EKG, CXR clear, INR wnl, good renal fxn, UA unremarkable  - Discussed with Dr. Sharol Given, recommend aspirin 325 mg a day for DVT prophylaxis for 30 days - Based on the available data, Ms. Gelb deemed relatively-low (0.4%) estimated risk probability of perioperative MI or cardiac arrest per Melburn Hake al Now being discharged to SNF   2. Hypertension  - BP elevated in ED in setting of pain and anxiety, improved spontaneously  - Resume Cozaar , dose adjusted to 50 mg a day    3. GERD - No EGD report on file     4. Dementia - Appears to be stable; pt able to contribute some to history  - Family at bedside warned about high-incidence of delirium in these settings; will continue to reorient, reassure pt, patient has a Environmental health practitioner at all times  5. OSA  - Tolerating CPAP at home, will continue qHS   6. Hyperglycemia - Continue sliding scale - Check CBG q4h while NPO and cover with low-intensity sliding-scale  -Hemoglobin A1c   pending at the time of discharge      Discharge Exam:  Blood pressure (!) 136/54, pulse 89, temperature 98.2 F (36.8 C), temperature source Oral, resp. rate 18, height '5\' 8"'$   (1.727 m), weight 80.3 kg (177 lb), SpO2 95 %.  General exam: Appears calm and comfortable  Respiratory system: Clear to auscultation. Respiratory effort normal. Cardiovascular system: S1 & S2 heard, RRR. No JVD, murmurs, rubs, gallops or clicks. No pedal edema. Gastrointestinal system: Abdomen is nondistended, soft and nontender. No organomegaly or masses felt. Normal bowel sounds heard. Central nervous system: confused . No focal neurological deficits. Extremities: Symmetric 5 x 5 power. Skin: No rashes, lesions or ulcers Psychiatry:  Mood & affect appropriate.     Follow-up Information    [name], MD In 2 weeks.   Specialty:  Orthopedic  Surgery Contact information: [place] [address] [phone]           Signed: Reyne Dumas 11/02/2016, 9:13 AM        Time spent >1 hour

## 2016-11-02 NOTE — NC FL2 (Signed)
[place] LEVEL OF CARE SCREENING TOOL     IDENTIFICATION  Patient Name: Kimberly Acevedo Birthdate: Feb 25, 1929 Sex: female Admission Date (Current Location): 10/30/2016  [facility] and [place] Number:  Herbalist and Address:  [facility], [place] [address], [place], [place] [address]      Provider Number: [id number]  Attending Physician Name and Address:  Reyne Dumas, MD  Relative Name and Phone Number:       Current Level of Care: Hospital Recommended Level of Care: [place] Prior Approval Number:    Date Approved/Denied: 11/02/16 PASRR Number: pending  Discharge Plan: SNF    Current Diagnoses: Patient Active Problem List   Diagnosis Date Noted  . Hyperglycemia 10/31/2016  . Closed right hip fracture, initial encounter ([place]) 10/30/2016  . OSA on CPAP 10/30/2016  . Volume depletion   . GI bleed 11/20/2015  . Gastrointestinal hemorrhage associated with intestinal diverticulitis   . Diverticulitis of colon   . Intra-abdominal hematoma 01/06/2015  . Normocytic normochromic anemia 01/06/2015  . Nausea vomiting and diarrhea 01/06/2015  . Bleeding internal hemorrhoids 07/16/2014  . Anal fissure - posterior 06/17/2014  . Dementia 06/15/2014  . Generalized abdominal pain   . Abdominal pain   . Pancreatitis 05/24/2014  . Leukocytosis 05/24/2014  . GERD (gastroesophageal reflux disease) 05/24/2014  . Dementia with behavioral disturbance 02/27/2014  . Rectal bleeding 05/21/2013  . Hypokalemia 05/21/2013  . Depression 05/21/2013  . Invasive lobular carcinoma of breast, stage 1 ([place]) 06/23/2012  . Hypertension, essential, benign   . Fibromyalgia   . Achalasia, esophageal     Orientation RESPIRATION BLADDER Height & Weight     Self  Normal Continent Weight: 177 lb (80.3 kg) Height:  5\' 8"  (172.7 cm)  BEHAVIORAL SYMPTOMS/MOOD NEUROLOGICAL BOWEL NUTRITION STATUS      Continent Diet (See DC summary)   AMBULATORY STATUS COMMUNICATION OF NEEDS Skin   Extensive Assist Verbally PU Stage and Appropriate Care, Surgical wounds   PU Stage 2 Dressing: TID (change Mepilex dressing for stage II; Closed Hip Incision with Aquacel Dressing)                   Personal Care Assistance Level of Assistance  Bathing, Feeding, Dressing Bathing Assistance: Limited assistance Feeding assistance: Limited assistance Dressing Assistance: Limited assistance     Functional Limitations Info  Sight, Hearing, Speech Sight Info: Adequate Hearing Info: Adequate Speech Info: Adequate    SPECIAL CARE FACTORS FREQUENCY  PT (By licensed PT), OT (By licensed OT)     PT Frequency: 5xweek OT Frequency: 5xweek            Contractures      Additional Factors Info  Code Status, Allergies, Insulin Sliding Scale, Psychotropic Code Status Info: DNR Allergies Info: PENICILLINS, STATINS, SULFA DRUGS CROSS REACTORS, TRAMADOL HCL, ASPIRIN, CODEINE, MORPHINE AND RELATED  Psychotropic Info: Zoloft Insulin Sliding Scale Info: 9 units every 4 hours       Current Medications (11/02/2016):  This is the current hospital active medication list Current Facility-Administered Medications  Medication Dose Route Frequency Provider Last Rate Last Dose  . acetaminophen (TYLENOL) tablet 650 mg  650 mg Oral Q6H PRN [name], MD   650 mg at 11/01/16 2222   Or  . acetaminophen (TYLENOL) suppository 650 mg  650 mg Rectal Q6H PRN [name], MD      . aspirin tablet 325 mg  325 mg Oral Daily Abrol,  Ascencion Dike, MD   325 mg at 11/01/16 1450  . bisacodyl (DULCOLAX) EC tablet 5 mg  5 mg Oral Daily PRN Opyd, Ilene Qua, MD      . cholecalciferol (VITAMIN D) tablet 1,000 Units  1,000 Units Oral Daily Opyd, Ilene Qua, MD   1,000 Units at 11/01/16 1019  . famotidine (PEPCID) tablet 20 mg  20 mg Oral QHS Reginia Naas, RPH   20 mg at 11/01/16 2222  . fentaNYL (SUBLIMAZE) injection 12.5-35 mcg  12.5-35 mcg Intravenous Q1H PRN  Opyd, Ilene Qua, MD   12.5 mcg at 10/31/16 2122  . hydrALAZINE (APRESOLINE) injection 10 mg  10 mg Intravenous Q4H PRN Opyd, Ilene Qua, MD      . HYDROcodone-acetaminophen (NORCO/VICODIN) 5-325 MG per tablet 1-2 tablet  1-2 tablet Oral Q6H PRN [name], MD   1 tablet at 11/01/16 1833  . insulin aspart (novoLOG) injection 0-9 Units  0-9 Units Subcutaneous Q4H Opyd, Ilene Qua, MD   2 Units at 11/01/16 1807  . losartan (COZAAR) tablet 50 mg  50 mg Oral Daily Reyne Dumas, MD   50 mg at 11/01/16 1450  . menthol-cetylpyridinium (CEPACOL) lozenge 3 mg  1 lozenge Oral PRN [name], MD       Or  . phenol (CHLORASEPTIC) mouth spray 1 spray  1 spray Mouth/Throat PRN [name], MD      . metoCLOPramide (REGLAN) tablet 5-10 mg  5-10 mg Oral Q8H PRN [name], MD       Or  . metoCLOPramide (REGLAN) injection 5-10 mg  5-10 mg Intravenous Q8H PRN [name], MD      . morphine 4 MG/ML injection 0.52 mg  0.52 mg Intravenous Q2H PRN Reyne Dumas, MD      . ondansetron (ZOFRAN) injection 4 mg  4 mg Intravenous Q6H PRN Opyd, Ilene Qua, MD      . ondansetron (ZOFRAN) tablet 4 mg  4 mg Oral Q6H PRN [name], MD       Or  . ondansetron [facility]) injection 4 mg  4 mg Intravenous Q6H PRN [name], MD      . polyethylene glycol (MIRALAX / GLYCOLAX) packet 17 g  17 g Oral Daily PRN Opyd, Ilene Qua, MD      . sertraline (ZOLOFT) tablet 100 mg  100 mg Oral Daily Opyd, Ilene Qua, MD   100 mg at 11/01/16 1019  . vitamin B-12 (CYANOCOBALAMIN) tablet 1,000 mcg  1,000 mcg Oral Daily Opyd, Ilene Qua, MD   1,000 mcg at 11/01/16 1019     Discharge Medications: Please see discharge summary for a list of discharge medications.  Relevant Imaging Results:  Relevant Lab Results:   Additional Information [id number]  Normajean Baxter, LCSW

## 2016-11-02 NOTE — Clinical Social Work Note (Signed)
Clinical Social Work Assessment  Patient Details  Name: Kimberly Acevedo MRN: [id number] Date of Birth: September 02, 1928  Date of referral:  11/02/16               Reason for consult:  Facility Placement                Permission sought to share information with:  Chartered certified accountant granted to share information::  Yes, Verbal Permission Granted  Name::     Astronomer::  SNF  Relationship::     Contact Information:     Housing/Transportation Living arrangements for the past 2 months:  [place] of Information:  Patient, Adult Children, Other (Comment Required) (Environmental health practitioner) Patient Interpreter Needed:  None Criminal Activity/Legal Involvement Pertinent to Current Situation/Hospitalization:  No - Comment as needed Significant Relationships:  Adult Children Lives with:  Self Do you feel safe going back to the place where you live?  No Need for family participation in patient care:  Yes (Comment)  Care giving concerns:  Patient resides at home and unsafe to return home at this time. Patient and family amenable to SNF placement.  Social Worker assessment / plan:  CSW met with daughter and patient at bedside to discuss recommendation at DC to continue therapy treatment at a SNF.  CSW explained role and discussed SNF options/placement. Family interested in [facility] and [facility]. CSW obtained permission to send out offers to SNF. CSW explained insurance authorization process and CSW faxed out clinicals this day.  FL2 and passr completed. Offers sent.  Employment status:  Retired Nurse, adult PT Recommendations:  [place] / Referral to community resources:  [place]  Patient/Family's Response to care:  Patient and family appreciative of CSW assistance. No issues or concerns with care.  Patient/Family's Understanding of and Emotional Response to Diagnosis, Current  Treatment, and Prognosis:  Patient and family has good understanding of diagnosis, current treatment and prognosis. Patient and family hopeful that her impairment will improve with rehabilitation. No issues or concerns identified at this time.  Emotional Assessment Appearance:  Appears stated age Attitude/Demeanor/Rapport:   (Cooperative) Affect (typically observed):  Accepting, Appropriate Orientation:  Oriented to Self Alcohol / Substance use:  Not Applicable Psych involvement (Current and /or in the community):  No (Comment)  Discharge Needs  Concerns to be addressed:  Care Coordination Readmission within the last 30 days:  Yes Current discharge risk:  Physical Impairment, Dependent with Mobility Barriers to Discharge:  No Barriers Identified   Normajean Baxter, LCSW 11/02/2016, 10:19 AM

## 2016-11-02 NOTE — Clinical Social Work Placement (Signed)
   CLINICAL SOCIAL WORK PLACEMENT  NOTE  Date:  11/02/2016  Patient Details  Name: [name] MRN: [id number] Date of Birth: 03/07/1929  Clinical Social Work is seeking post-discharge placement for this patient at the [place] level of care (*CSW will initial, date and re-position this form in  chart as items are completed):  Yes   Patient/family provided with [place] Work Department's list of facilities offering this level of care within the geographic area requested by the patient (or if unable, by the patient's family).  Yes   Patient/family informed of their freedom to choose among providers that offer the needed level of care, that participate in Medicare, Medicaid or managed care program needed by the patient, have an available bed and are willing to accept the patient.  Yes   Patient/family informed of [facility]'s ownership interest in [facility] and [facility], as well as of the fact that they are under no obligation to receive care at these facilities.  PASRR submitted to EDS on 11/02/16     PASRR number received on 11/02/16     Existing PASRR number confirmed on       FL2 transmitted to all facilities in geographic area requested by pt/family on 11/02/16     FL2 transmitted to all facilities within larger geographic area on       Patient informed that his/her managed care company has contracts with or will negotiate with certain facilities, including the following:        Yes   Patient/family informed of bed offers received.  Patient chooses bed at [place], [place]     Physician recommends and patient chooses bed at      Patient to be transferred to [facility] on 11/02/16.  Patient to be transferred to facility by [facility]     Patient family notified on 11/02/16 of transfer.  Name of family member notified:  Wilder Glade     PHYSICIAN Please prepare prescriptions, Please sign DNR      Additional Comment:    _______________________________________________ Candie Chroman, LCSW 11/02/2016, 2:24 PM

## 2016-11-02 NOTE — Clinical Social Work Note (Signed)
CSW facilitated patient discharge including contacting patient family and facility to confirm patient discharge plans. Clinical information faxed to facility and family agreeable with plan. CSW arranged ambulance transport via [facility] to [place] around 3:45 pm. RN to call report prior to discharge [phone] Room 603B).  CSW will sign off for now as social work intervention is no longer needed. Please consult [place] again if new needs arise.  Dayton Scrape, [place]

## 2016-11-03 ENCOUNTER — Ambulatory Visit (INDEPENDENT_AMBULATORY_CARE_PROVIDER_SITE_OTHER): Payer: Medicare Other | Admitting: Orthopaedic Surgery

## 2016-11-07 DIAGNOSIS — S72141D Displaced intertrochanteric fracture of right femur, subsequent encounter for closed fracture with routine healing: Secondary | ICD-10-CM | POA: Diagnosis not present

## 2016-11-07 DIAGNOSIS — G4733 Obstructive sleep apnea (adult) (pediatric): Secondary | ICD-10-CM | POA: Diagnosis not present

## 2016-11-07 DIAGNOSIS — F0391 Unspecified dementia with behavioral disturbance: Secondary | ICD-10-CM | POA: Diagnosis not present

## 2016-11-07 DIAGNOSIS — I10 Essential (primary) hypertension: Secondary | ICD-10-CM | POA: Diagnosis not present

## 2016-11-15 ENCOUNTER — Encounter (INDEPENDENT_AMBULATORY_CARE_PROVIDER_SITE_OTHER): Payer: Self-pay | Admitting: Orthopedic Surgery

## 2016-11-15 ENCOUNTER — Ambulatory Visit (INDEPENDENT_AMBULATORY_CARE_PROVIDER_SITE_OTHER): Payer: Medicare Other | Admitting: Orthopedic Surgery

## 2016-11-15 ENCOUNTER — Ambulatory Visit (INDEPENDENT_AMBULATORY_CARE_PROVIDER_SITE_OTHER): Payer: Self-pay

## 2016-11-15 VITALS — Ht 68.0 in | Wt 177.0 lb

## 2016-11-15 DIAGNOSIS — M25551 Pain in right hip: Secondary | ICD-10-CM | POA: Diagnosis not present

## 2016-11-15 DIAGNOSIS — S72001A Fracture of unspecified part of neck of right femur, initial encounter for closed fracture: Secondary | ICD-10-CM

## 2016-11-15 NOTE — Progress Notes (Signed)
Post-Op Visit Note   Patient: Kimberly Acevedo           Date of Birth: 1928-07-10           MRN: [id number] Visit Date: 11/15/2016 PCP: Deland Pretty, MD  Chief Complaint:  Chief Complaint  Patient presents with  . Right Hip - Routine Post Op    // right IM nail intertroch fracture.     HPI:  Patient is an 83 year old woman seen status post IM nailing of the right intertrochanteric fracture on May 27. She's been nonweightbearing in wheelchair. Is residing at [facility] for her rehabilitation. The patient is confused however does complain of pain that she "can't stand". Her family reports that they have not had her up with the therapy as they have not had orders for weightbearing.    Ortho Exam Incisions are healing well. Staples harvested without incident. No drainage, surrounding erythema or sign of infection. Stiffness with external and internal rotation right hip, extension and flexion.   Visit Diagnoses:  1. Pain in right hip   2. Closed right hip fracture, initial encounter ([place])     Plan: follow up in 2 weeks with repeat radiographs. WBAT with PT. Cleanse incision daily, may get wet. Apply dry dressing.   Follow-Up Instructions: Return in about 2 weeks (around 11/29/2016).   Imaging: Xr Hip Unilat W Or W/o Pelvis 2-3 Views Right  Result Date: 11/15/2016 Radiographs of right hip show stable alignment of IM nailing right femur. No complicating features.    Orders:  Orders Placed This Encounter  Procedures  . XR HIP UNILAT W OR W/O PELVIS 2-3 VIEWS RIGHT   No orders of the defined types were placed in this encounter.    PMFS History: Patient Active Problem List   Diagnosis Date Noted  . Pressure injury of skin 11/02/2016  . Hyperglycemia 10/31/2016  . Closed right hip fracture, initial encounter ([place]) 10/30/2016  . OSA on CPAP 10/30/2016  . Volume depletion   . GI bleed 11/20/2015  . Gastrointestinal hemorrhage associated with intestinal diverticulitis   .  Diverticulitis of colon   . Intra-abdominal hematoma 01/06/2015  . Normocytic normochromic anemia 01/06/2015  . Nausea vomiting and diarrhea 01/06/2015  . Bleeding internal hemorrhoids 07/16/2014  . Anal fissure - posterior 06/17/2014  . Dementia 06/15/2014  . Generalized abdominal pain   . Abdominal pain   . Pancreatitis 05/24/2014  . Leukocytosis 05/24/2014  . GERD (gastroesophageal reflux disease) 05/24/2014  . Dementia with behavioral disturbance 02/27/2014  . Rectal bleeding 05/21/2013  . Hypokalemia 05/21/2013  . Depression 05/21/2013  . Invasive lobular carcinoma of breast, stage 1 ([place]) 06/23/2012  . Hypertension, essential, benign   . Fibromyalgia   . Achalasia, esophageal    Past Medical History:  Diagnosis Date  . Anal fissure   . Arthritis   . Bilateral edema of lower extremity   . Cognitive dysfunction    DUE TO DEMENTIA--  PER NEUROLOGIST NOTE SEVERE  . Dementia with behavioral disturbance    PER NEUROLOGIST NOTE PT ABLE TO DO ADL'S BUT UNABLE TO CARE FOR HOME AND NEEDED AND PT HAS 24 HOUR CARE  . Dyslipidemia   . Fibromyalgia   . GERD (gastroesophageal reflux disease)   . Hemorrhoids   . History of breast cancer oncologist-  dr Humphrey Rolls--- no recurrence   dx 05-19-2012  Stage I , Invasive Lobular/ LCIS & microinvasive ductal and DCIS (T1 A NX M0)--  s/p  left partial mastectomy  07-04-2012 and radiation  . History of diverticulitis of colon   . History of radiation therapy 08-10-2012 to 09-08-2012   Left breast 750 cGy in 3 sessions  . History of squamous cell carcinoma excision    NOSE  . Hypertension   . OSA on CPAP   . Prolapsed internal hemorrhoids, grade 3   . Varicose veins     Family History  Problem Relation Age of Onset  . Cancer Mother 31       melanoma ca  . Arthritis Father   . Dementia Neg Hx     Past Surgical History:  Procedure Laterality Date  . BREAST LUMPECTOMY WITH NEEDLE LOCALIZATION  07/04/2012   Procedure: BREAST LUMPECTOMY WITH  NEEDLE LOCALIZATION;  Surgeon: Edward Jolly, MD;  Location: [facility];  Service: General;  Laterality: Left;  left  . CHOLECYSTECTOMY  2007  . COLONOSCOPY    . ESOPHAGEAL DILATION    . HEMORRHOID BANDING  Mar & Apr 2016  . HEMORRHOID SURGERY N/A 12/25/2014   Procedure: HEMORRHOIDOPEXY WITH ANAL EXAM;  Surgeon: Leighton Ruff, MD;  Location[facility];  Service: General;  Laterality: N/A;  . INTRAMEDULLARY (IM) NAIL INTERTROCHANTERIC Right 10/31/2016   Procedure: INTRAMEDULLARY (IM) NAIL INTERTROCHANTRIC;  Surgeon: [name], MD;  Location: [facility];  Service: Orthopedics;  Laterality: Right;  . RECTOCELE REPAIR    . TOTAL ABDOMINAL HYSTERECTOMY  1976  . VARICOSE VEIN SURGERY  2010   left lower extremity   Social History   Occupational History  . interior design   . Retired     Social History Main Topics  . Smoking status: Never Smoker  . Smokeless tobacco: Never Used  . Alcohol use No  . Drug use: No  . Sexual activity: Not on file

## 2016-11-19 DIAGNOSIS — Z4889 Encounter for other specified surgical aftercare: Secondary | ICD-10-CM | POA: Diagnosis not present

## 2016-11-19 DIAGNOSIS — Z4789 Encounter for other orthopedic aftercare: Secondary | ICD-10-CM | POA: Diagnosis not present

## 2016-11-19 DIAGNOSIS — G4733 Obstructive sleep apnea (adult) (pediatric): Secondary | ICD-10-CM | POA: Diagnosis not present

## 2016-11-19 DIAGNOSIS — S72141D Displaced intertrochanteric fracture of right femur, subsequent encounter for closed fracture with routine healing: Secondary | ICD-10-CM | POA: Diagnosis not present

## 2016-11-21 ENCOUNTER — Emergency Department (HOSPITAL_COMMUNITY): Payer: Medicare Other

## 2016-11-21 ENCOUNTER — Encounter (HOSPITAL_COMMUNITY): Payer: Self-pay | Admitting: Emergency Medicine

## 2016-11-21 ENCOUNTER — Emergency Department (HOSPITAL_COMMUNITY)
Admission: EM | Admit: 2016-11-21 | Discharge: 2016-11-21 | Disposition: A | Discharge status: Home / Self Care | Payer: Medicare Other | Attending: Emergency Medicine | Admitting: Emergency Medicine

## 2016-11-21 DIAGNOSIS — Z79899 Other long term (current) drug therapy: Secondary | ICD-10-CM | POA: Diagnosis not present

## 2016-11-21 DIAGNOSIS — W19XXXA Unspecified fall, initial encounter: Secondary | ICD-10-CM | POA: Insufficient documentation

## 2016-11-21 DIAGNOSIS — M25551 Pain in right hip: Secondary | ICD-10-CM | POA: Diagnosis not present

## 2016-11-21 DIAGNOSIS — S199XXA Unspecified injury of neck, initial encounter: Secondary | ICD-10-CM | POA: Diagnosis not present

## 2016-11-21 DIAGNOSIS — R260 Ataxic gait: Secondary | ICD-10-CM | POA: Diagnosis not present

## 2016-11-21 DIAGNOSIS — S72001A Fracture of unspecified part of neck of right femur, initial encounter for closed fracture: Secondary | ICD-10-CM | POA: Diagnosis not present

## 2016-11-21 DIAGNOSIS — T148XXA Other injury of unspecified body region, initial encounter: Secondary | ICD-10-CM | POA: Diagnosis not present

## 2016-11-21 DIAGNOSIS — S299XXA Unspecified injury of thorax, initial encounter: Secondary | ICD-10-CM | POA: Diagnosis not present

## 2016-11-21 DIAGNOSIS — Z853 Personal history of malignant neoplasm of breast: Secondary | ICD-10-CM | POA: Insufficient documentation

## 2016-11-21 DIAGNOSIS — S3993XA Unspecified injury of pelvis, initial encounter: Secondary | ICD-10-CM | POA: Diagnosis not present

## 2016-11-21 DIAGNOSIS — Y939 Activity, unspecified: Secondary | ICD-10-CM | POA: Diagnosis not present

## 2016-11-21 DIAGNOSIS — S0990XA Unspecified injury of head, initial encounter: Secondary | ICD-10-CM | POA: Diagnosis not present

## 2016-11-21 DIAGNOSIS — Y92129 Unspecified place in nursing home as the place of occurrence of the external cause: Secondary | ICD-10-CM | POA: Diagnosis not present

## 2016-11-21 DIAGNOSIS — Y999 Unspecified external cause status: Secondary | ICD-10-CM | POA: Insufficient documentation

## 2016-11-21 DIAGNOSIS — I10 Essential (primary) hypertension: Secondary | ICD-10-CM | POA: Insufficient documentation

## 2016-11-21 DIAGNOSIS — R4182 Altered mental status, unspecified: Secondary | ICD-10-CM | POA: Diagnosis not present

## 2016-11-21 DIAGNOSIS — R930 Abnormal findings on diagnostic imaging of skull and head, not elsewhere classified: Secondary | ICD-10-CM | POA: Diagnosis not present

## 2016-11-21 DIAGNOSIS — Z7982 Long term (current) use of aspirin: Secondary | ICD-10-CM | POA: Diagnosis not present

## 2016-11-21 DIAGNOSIS — T1490XA Injury, unspecified, initial encounter: Secondary | ICD-10-CM | POA: Diagnosis not present

## 2016-11-21 DIAGNOSIS — R9431 Abnormal electrocardiogram [ECG] [EKG]: Secondary | ICD-10-CM | POA: Diagnosis not present

## 2016-11-21 LAB — I-STAT TROPONIN, ED: Troponin i, poc: 0 ng/mL (ref 0.00–0.08)

## 2016-11-21 LAB — COMPREHENSIVE METABOLIC PANEL
ALBUMIN: 3.2 g/dL — AB (ref 3.5–5.0)
ALK PHOS: 109 U/L (ref 38–126)
ALT: 20 U/L (ref 14–54)
ANION GAP: 11 (ref 5–15)
AST: 30 U/L (ref 15–41)
BILIRUBIN TOTAL: 0.4 mg/dL (ref 0.3–1.2)
BUN: 13 mg/dL (ref 6–20)
CALCIUM: 8.8 mg/dL — AB (ref 8.9–10.3)
CO2: 27 mmol/L (ref 22–32)
CREATININE: 0.7 mg/dL (ref 0.44–1.00)
Chloride: 100 mmol/L — ABNORMAL LOW (ref 101–111)
GFR calc Af Amer: >60 mL/min (ref >60)
GFR calc non Af Amer: >60 mL/min (ref >60)
GLUCOSE: 110 mg/dL — AB (ref 65–99)
Potassium: 3.1 mmol/L — ABNORMAL LOW (ref 3.5–5.1)
SODIUM: 138 mmol/L (ref 135–145)
TOTAL PROTEIN: 7.2 g/dL (ref 6.5–8.1)

## 2016-11-21 LAB — CBC WITH DIFFERENTIAL/PLATELET
BASOS PCT: 0 %
Basophils Absolute: 0 10*3/uL (ref 0.0–0.1)
EOS ABS: 0 10*3/uL (ref 0.0–0.7)
Eosinophils Relative: 0 %
HEMATOCRIT: 42.4 % (ref 36.0–46.0)
HEMOGLOBIN: 14.2 g/dL (ref 12.0–15.0)
LYMPHS ABS: 2.7 10*3/uL (ref 0.7–4.0)
Lymphocytes Relative: 25 %
MCH: 31.1 pg (ref 26.0–34.0)
MCHC: 33.5 g/dL (ref 30.0–36.0)
MCV: 93 fL (ref 78.0–100.0)
MONOS PCT: 10 %
Monocytes Absolute: 1.1 10*3/uL — ABNORMAL HIGH (ref 0.1–1.0)
NEUTROS ABS: 7 10*3/uL (ref 1.7–7.7)
NEUTROS PCT: 65 %
Platelets: 355 10*3/uL (ref 150–400)
RBC: 4.56 MIL/uL (ref 3.87–5.11)
RDW: 13.2 % (ref 11.5–15.5)
WBC: 10.8 10*3/uL — ABNORMAL HIGH (ref 4.0–10.5)

## 2016-11-21 MED ORDER — SODIUM CHLORIDE 0.9 % IV BOLUS (SEPSIS)
500.0000 mL | Freq: Once | INTRAVENOUS | Status: AC
  Administered 2016-11-21: 500 mL via INTRAVENOUS

## 2016-11-21 NOTE — ED Provider Notes (Signed)
[facility] DEPT Provider Note   CSN: [id number] Arrival date & time: 11/21/16  1708     History   Chief Complaint Chief Complaint  Patient presents with  . Fall    HPI Kimberly Acevedo is a 83 y.o. female hx of dementia, GERD, recent hip fracture s/p surgery, here with fall. Patient recently had right hip fracture and just finished rehabilitation and is currently in the nursing home. Patient was noted to have slid out of her recliner. It is unclear if she hit her head or her hip at all. She denies any new pain. She also did not remember how she fell. Moreover, patient has been on by mouth vancomycin for C. difficile but denies active diarrhea. Denies any fevers at the facility.  The history is provided by the patient, the EMS personnel and a relative. The history is limited by the condition of the patient.   Level V caveat- dementia   Past Medical History:  Diagnosis Date  . Anal fissure   . Arthritis   . Bilateral edema of lower extremity   . Cognitive dysfunction    DUE TO DEMENTIA--  PER NEUROLOGIST NOTE SEVERE  . Dementia with behavioral disturbance    PER NEUROLOGIST NOTE PT ABLE TO DO ADL'S BUT UNABLE TO CARE FOR HOME AND NEEDED AND PT HAS 24 HOUR CARE  . Dyslipidemia   . Fibromyalgia   . GERD (gastroesophageal reflux disease)   . Hemorrhoids   . History of breast cancer oncologist-  dr Humphrey Rolls--- no recurrence   dx 05-19-2012  Stage I , Invasive Lobular/ LCIS & microinvasive ductal and DCIS (T1 A NX M0)--  s/p  left partial mastectomy 07-04-2012 and radiation  . History of diverticulitis of colon   . History of radiation therapy 08-10-2012 to 09-08-2012   Left breast 750 cGy in 3 sessions  . History of squamous cell carcinoma excision    NOSE  . Hypertension   . OSA on CPAP   . Prolapsed internal hemorrhoids, grade 3   . Varicose veins     Patient Active Problem List   Diagnosis Date Noted  . Pressure injury of skin 11/02/2016  . Hyperglycemia 10/31/2016  .  Closed right hip fracture, initial encounter ([place]) 10/30/2016  . OSA on CPAP 10/30/2016  . Volume depletion   . GI bleed 11/20/2015  . Gastrointestinal hemorrhage associated with intestinal diverticulitis   . Diverticulitis of colon   . Intra-abdominal hematoma 01/06/2015  . Normocytic normochromic anemia 01/06/2015  . Nausea vomiting and diarrhea 01/06/2015  . Bleeding internal hemorrhoids 07/16/2014  . Anal fissure - posterior 06/17/2014  . Dementia 06/15/2014  . Generalized abdominal pain   . Abdominal pain   . Pancreatitis 05/24/2014  . Leukocytosis 05/24/2014  . GERD (gastroesophageal reflux disease) 05/24/2014  . Dementia with behavioral disturbance 02/27/2014  . Rectal bleeding 05/21/2013  . Hypokalemia 05/21/2013  . Depression 05/21/2013  . Invasive lobular carcinoma of breast, stage 1 ([place]) 06/23/2012  . Hypertension, essential, benign   . Fibromyalgia   . Achalasia, esophageal     Past Surgical History:  Procedure Laterality Date  . BREAST LUMPECTOMY WITH NEEDLE LOCALIZATION  07/04/2012   Procedure: BREAST LUMPECTOMY WITH NEEDLE LOCALIZATION;  Surgeon: Edward Jolly, MD;  Location: [facility];  Service: General;  Laterality: Left;  left  . CHOLECYSTECTOMY  2007  . COLONOSCOPY    . ESOPHAGEAL DILATION    . HEMORRHOID BANDING  Mar & Apr 2016  . HEMORRHOID  SURGERY N/A 12/25/2014   Procedure: HEMORRHOIDOPEXY WITH ANAL EXAM;  Surgeon: Leighton Ruff, MD;  Location: [facility];  Service: General;  Laterality: N/A;  . INTRAMEDULLARY (IM) NAIL INTERTROCHANTERIC Right 10/31/2016   Procedure: INTRAMEDULLARY (IM) NAIL INTERTROCHANTRIC;  Surgeon: [name], MD;  Location: [facility];  Service: Orthopedics;  Laterality: Right;  . RECTOCELE REPAIR    . TOTAL ABDOMINAL HYSTERECTOMY  1976  . VARICOSE VEIN SURGERY  2010   left lower extremity    OB History    No data available       Home Medications    Prior to Admission medications     Medication Sig Start Date End Date Taking? Authorizing Provider  acetaminophen (TYLENOL) 500 MG tablet Take 1 tablet (500 mg total) by mouth every 6 (six) hours as needed for mild pain. 10/31/16   [name], MD  aspirin 325 MG tablet Take 1 tablet (325 mg total) by mouth daily. 11/02/16   Reyne Dumas, MD  bisacodyl (DULCOLAX) 5 MG EC tablet Take 1 tablet (5 mg total) by mouth daily as needed for moderate constipation. 11/02/16   Reyne Dumas, MD  Cholecalciferol (VITAMIN D) 2000 UNITS CAPS Take 1 capsule by mouth daily.    [provider]  losartan (COZAAR) 50 MG tablet Take 1 tablet (50 mg total) by mouth daily. 11/02/16   Reyne Dumas, MD  polyethylene glycol (MIRALAX / GLYCOLAX) packet Take 17 g by mouth daily as needed for mild constipation. 11/02/16   Reyne Dumas, MD  sertraline (ZOLOFT) 100 MG tablet Take 100 mg by mouth daily. 07/13/16   [provider]  vitamin B-12 (CYANOCOBALAMIN) 1000 MCG tablet Take 1,000 mcg by mouth daily.    [provider]    Family History Family History  Problem Relation Age of Onset  . Cancer Mother 75       melanoma ca  . Arthritis Father   . Dementia Neg Hx     Social History Social History  Substance Use Topics  . Smoking status: Never Smoker  . Smokeless tobacco: Never Used  . Alcohol use No     Allergies   Penicillins; Statins; Sulfa drugs cross reactors; Tramadol hcl; Aspirin; Codeine; and Morphine and related   Review of Systems Review of Systems  Musculoskeletal:       R hip pain (chronic)   All other systems reviewed and are negative.    Physical Exam Updated Vital Signs BP (!) 162/69 (BP Location: Right Arm)   Pulse 88   Temp 98.1 F (36.7 C)   Resp 18   Ht 5\' 8"  (1.727 m)   Wt 80.3 kg (177 lb)   SpO2 95%   BMI 26.91 kg/m   Physical Exam  Constitutional:  Chronically ill, demented   HENT:  Head: Normocephalic.  Mouth/Throat: Oropharynx is clear and moist.  No obvious scalp  hematoma   Eyes: Conjunctivae and EOM are normal. Pupils are equal, round, and reactive to light.  Neck: Normal range of motion. Neck supple.  Cardiovascular: Normal rate, regular rhythm and normal heart sounds.   Pulmonary/Chest: Effort normal and breath sounds normal. No respiratory distress. She has no wheezes.  Abdominal: Soft. Bowel sounds are normal. She exhibits no distension. There is no tenderness.  Musculoskeletal: Normal range of motion.  R hip surgical site healing well, minimal tenderness around it, no obvious deformity or dislocation   Neurological:  A & O x 1. Moving all extremities   Skin: Skin  is warm.  Psychiatric:  Unable   Nursing note and vitals reviewed.    ED Treatments / Results  Labs (all labs ordered are listed, but only abnormal results are displayed) Labs Reviewed  CBC WITH DIFFERENTIAL/PLATELET - Abnormal; Notable for the following:       Result Value   WBC 10.8 (*)    Monocytes Absolute 1.1 (*)    All other components within normal limits  COMPREHENSIVE METABOLIC PANEL - Abnormal; Notable for the following:    Potassium 3.1 (*)    Chloride 100 (*)    Glucose, Bld 110 (*)    Calcium 8.8 (*)    Albumin 3.2 (*)    All other components within normal limits  I-STAT TROPOININ, ED    EKG  EKG Interpretation  Date/Time:  Sunday November 21 2016 18:04:00 EDT Ventricular Rate:  81 PR Interval:    QRS Duration: 105 QT Interval:  385 QTC Calculation: 447 R Axis:   88 Text Interpretation:  Sinus rhythm Borderline right axis deviation No significant change since last tracing Confirmed by Wandra Arthurs [phone]) on 11/21/2016 6:28:05 PM       Radiology Dg Pelvis 1-2 Views  Result Date: 11/21/2016 CLINICAL DATA:  83 year old female with fall. EXAM: PELVIS - 1-2 VIEW COMPARISON:  Intraoperative right hip radiograph dated 10/31/2016 and CT of the abdomen pelvis dated 11/20/2015 FINDINGS: There is redemonstration of the right femoral neck fracture status post  ORIF. The visualized portion of the orthopedic hardware appear intact. No new fracture identified. The bones are osteopenic. There is no dislocation. There is mild osteoarthritic changes of the hips. There is degenerative changes of the lower lumbar spine. The soft tissues appear unremarkable. IMPRESSION: 1. No acute fracture or dislocation. 2. History status post prior ORIF of the right femoral neck fracture. 3. Osteopenia. Electronically Signed   By: Anner Crete M.D.   On: 11/21/2016 18:58   Dg Chest Port 1 View  Result Date: 11/21/2016 CLINICAL DATA:  Unwitnessed fall. EXAM: PORTABLE CHEST 1 VIEW COMPARISON:  10/31/2016 FINDINGS: Cardiomediastinal silhouette is normal. Mediastinal contours appear intact. Calcific atherosclerotic disease and tortuosity of the aorta. There is no evidence of focal airspace consolidation, pleural effusion or pneumothorax. Osseous structures are without acute abnormality. Soft tissues are grossly normal. IMPRESSION: No active disease. Aortic atherosclerosis. Electronically Signed   By: Fidela Salisbury M.D.   On: 11/21/2016 19:01    Procedures Procedures (including critical care time)  Medications Ordered in ED Medications  sodium chloride 0.9 % bolus 500 mL (500 mLs Intravenous New Bag/Given 11/21/16 1757)     Initial Impression / Assessment and Plan / ED Course  I have reviewed the triage vital signs and the nursing notes.  Pertinent labs & imaging results that were available during my care of the patient were reviewed by me and considered in my medical decision making (see chart for details).     Kimberly Acevedo is a 83 y.o. female here with fall. Likely mechanical fall. Given hx of C diff and currently on PO vanc, will get CBC, CMP. Will get CT head/neck, xrays.   8:06 PM Labs unremarkable. WBC 10. CT head/neck unremarkable (unable to cross over in EPIC but able to view the read on PACS), xrays showed no fracture. Will dc back to facility.    Final Clinical Impressions(s) / ED Diagnoses   Final diagnoses:  Fall    New Prescriptions New Prescriptions   No medications on file  Drenda Freeze, MD 11/21/16 2007

## 2016-11-21 NOTE — Discharge Instructions (Signed)
She needs to be on fall precautions at the facility.   See your doctor  Return to ER if you have another fall, headaches, vomiting, unable to walk.

## 2016-11-21 NOTE — ED Triage Notes (Signed)
Patient BIB EMS from "[facility]" / [facility] home for unwitnessed fall (slid out of her recliner) at approx 1620. No injuries or complaints of pain at this time. Patient has history of dimentia and is AxOx1 (alert to self) as baseline. Patient has history of chronic pain, but denies "new" pain. Patient is documented with C-diff at this time.

## 2016-11-22 DIAGNOSIS — G4733 Obstructive sleep apnea (adult) (pediatric): Secondary | ICD-10-CM | POA: Diagnosis not present

## 2016-11-22 DIAGNOSIS — I10 Essential (primary) hypertension: Secondary | ICD-10-CM | POA: Diagnosis not present

## 2016-11-22 DIAGNOSIS — Z4889 Encounter for other specified surgical aftercare: Secondary | ICD-10-CM | POA: Diagnosis not present

## 2016-11-22 DIAGNOSIS — S72141D Displaced intertrochanteric fracture of right femur, subsequent encounter for closed fracture with routine healing: Secondary | ICD-10-CM | POA: Diagnosis not present

## 2016-11-22 DIAGNOSIS — F329 Major depressive disorder, single episode, unspecified: Secondary | ICD-10-CM | POA: Diagnosis not present

## 2016-11-22 DIAGNOSIS — S71141D Puncture wound with foreign body, right thigh, subsequent encounter: Secondary | ICD-10-CM | POA: Diagnosis not present

## 2016-11-22 DIAGNOSIS — Z4789 Encounter for other orthopedic aftercare: Secondary | ICD-10-CM | POA: Diagnosis not present

## 2016-11-22 DIAGNOSIS — G309 Alzheimer's disease, unspecified: Secondary | ICD-10-CM | POA: Diagnosis not present

## 2016-11-23 DIAGNOSIS — G4733 Obstructive sleep apnea (adult) (pediatric): Secondary | ICD-10-CM | POA: Diagnosis not present

## 2016-11-23 DIAGNOSIS — S72141D Displaced intertrochanteric fracture of right femur, subsequent encounter for closed fracture with routine healing: Secondary | ICD-10-CM | POA: Diagnosis not present

## 2016-11-23 DIAGNOSIS — S72091A Other fracture of head and neck of right femur, initial encounter for closed fracture: Secondary | ICD-10-CM | POA: Diagnosis not present

## 2016-11-23 DIAGNOSIS — F329 Major depressive disorder, single episode, unspecified: Secondary | ICD-10-CM | POA: Diagnosis not present

## 2016-11-23 DIAGNOSIS — I10 Essential (primary) hypertension: Secondary | ICD-10-CM | POA: Diagnosis not present

## 2016-11-23 DIAGNOSIS — F0280 Dementia in other diseases classified elsewhere without behavioral disturbance: Secondary | ICD-10-CM | POA: Diagnosis not present

## 2016-11-23 DIAGNOSIS — G309 Alzheimer's disease, unspecified: Secondary | ICD-10-CM | POA: Diagnosis not present

## 2016-11-24 ENCOUNTER — Telehealth (INDEPENDENT_AMBULATORY_CARE_PROVIDER_SITE_OTHER): Payer: Self-pay | Admitting: Orthopedic Surgery

## 2016-11-24 DIAGNOSIS — M79651 Pain in right thigh: Secondary | ICD-10-CM | POA: Diagnosis not present

## 2016-11-24 DIAGNOSIS — A0472 Enterocolitis due to Clostridium difficile, not specified as recurrent: Secondary | ICD-10-CM | POA: Diagnosis not present

## 2016-11-24 DIAGNOSIS — M25551 Pain in right hip: Secondary | ICD-10-CM | POA: Diagnosis not present

## 2016-11-24 DIAGNOSIS — R269 Unspecified abnormalities of gait and mobility: Secondary | ICD-10-CM | POA: Diagnosis not present

## 2016-11-24 DIAGNOSIS — S72091A Other fracture of head and neck of right femur, initial encounter for closed fracture: Secondary | ICD-10-CM | POA: Diagnosis not present

## 2016-11-24 DIAGNOSIS — W19XXXA Unspecified fall, initial encounter: Secondary | ICD-10-CM | POA: Diagnosis not present

## 2016-11-24 DIAGNOSIS — Z79899 Other long term (current) drug therapy: Secondary | ICD-10-CM | POA: Diagnosis not present

## 2016-11-24 NOTE — Telephone Encounter (Signed)
Katie from [facility] request the current weight bearing status for the patient. She also states patient has been walking on foot.

## 2016-11-24 NOTE — Telephone Encounter (Signed)
I called and left vm for Katie. Advised at last ov patient was advise to be WTBAT with physical therapy, however patient recently had a fall on 11/21/16 and is going to be reevaluated on 6/25. Due to patient hx of dementia and recent fall, showing no new fracture, to be safe have patient be touch down weightbearing until new evaluation on 11/29/16.

## 2016-11-26 DIAGNOSIS — Z4889 Encounter for other specified surgical aftercare: Secondary | ICD-10-CM | POA: Diagnosis not present

## 2016-11-26 DIAGNOSIS — W19XXXD Unspecified fall, subsequent encounter: Secondary | ICD-10-CM | POA: Diagnosis not present

## 2016-11-26 DIAGNOSIS — G4733 Obstructive sleep apnea (adult) (pediatric): Secondary | ICD-10-CM | POA: Diagnosis not present

## 2016-11-26 DIAGNOSIS — S72141D Displaced intertrochanteric fracture of right femur, subsequent encounter for closed fracture with routine healing: Secondary | ICD-10-CM | POA: Diagnosis not present

## 2016-11-29 ENCOUNTER — Ambulatory Visit (INDEPENDENT_AMBULATORY_CARE_PROVIDER_SITE_OTHER): Payer: Medicare Other | Admitting: Orthopedic Surgery

## 2016-11-30 DIAGNOSIS — A0472 Enterocolitis due to Clostridium difficile, not specified as recurrent: Secondary | ICD-10-CM | POA: Diagnosis not present

## 2016-11-30 DIAGNOSIS — Z79899 Other long term (current) drug therapy: Secondary | ICD-10-CM | POA: Diagnosis not present

## 2016-11-30 DIAGNOSIS — E860 Dehydration: Secondary | ICD-10-CM | POA: Diagnosis not present

## 2016-11-30 DIAGNOSIS — R54 Age-related physical debility: Secondary | ICD-10-CM | POA: Diagnosis not present

## 2016-12-02 ENCOUNTER — Ambulatory Visit (INDEPENDENT_AMBULATORY_CARE_PROVIDER_SITE_OTHER): Payer: Self-pay

## 2016-12-02 ENCOUNTER — Ambulatory Visit (INDEPENDENT_AMBULATORY_CARE_PROVIDER_SITE_OTHER): Payer: Medicare Other | Admitting: Orthopedic Surgery

## 2016-12-02 ENCOUNTER — Encounter (INDEPENDENT_AMBULATORY_CARE_PROVIDER_SITE_OTHER): Payer: Self-pay | Admitting: Orthopedic Surgery

## 2016-12-02 VITALS — Ht 68.0 in | Wt 177.0 lb

## 2016-12-02 DIAGNOSIS — M25551 Pain in right hip: Secondary | ICD-10-CM | POA: Diagnosis not present

## 2016-12-02 DIAGNOSIS — G301 Alzheimer's disease with late onset: Secondary | ICD-10-CM

## 2016-12-02 DIAGNOSIS — F02818 Dementia in other diseases classified elsewhere, unspecified severity, with other behavioral disturbance: Secondary | ICD-10-CM

## 2016-12-02 DIAGNOSIS — S72001A Fracture of unspecified part of neck of right femur, initial encounter for closed fracture: Secondary | ICD-10-CM

## 2016-12-02 DIAGNOSIS — F0281 Dementia in other diseases classified elsewhere with behavioral disturbance: Secondary | ICD-10-CM

## 2016-12-02 NOTE — Progress Notes (Signed)
Office Visit Note   Patient: Kimberly Acevedo           Date of Birth: 30-May-1929           MRN: [id number] Visit Date: 12/02/2016              Requested by: Deland Pretty, MD [address] [place], [place] [address] PCP: Deland Pretty, MD  Chief Complaint  Patient presents with  . Right Hip - Routine Post Op    10/31/16 right IM nail intertroch fracture       HPI: Patient is a 83 year old woman with Alzheimer's dementia 4 weeks status post intramedullary nailing for right hip fracture. Patient has been nonweightbearing she has been completed assistance with a Hoyer lift most likely due to the dementia.  Assessment & Plan: Visit Diagnoses:  1. Pain in right hip   2. Closed right hip fracture, initial encounter ([place])   3. Late onset Alzheimer's disease with behavioral disturbance     Plan: Patient may attempt working with physical therapy weightbearing as tolerated anticipate that she most likely will be total assistance with transfers via a Hoyer lift and will be a permanent skilled nursing resident.  Follow-Up Instructions: Return if symptoms worsen or fail to improve.   Ortho Exam  Patient is not alert or oriented, no adenopathy, well-dressed, normal affect, normal respiratory effort. Patient is ambulating in a wheelchair. There is no pain with range of motion of her hip the incisions are well healed.  Imaging: Xr Hip Unilat W Or W/o Pelvis 2-3 Views Right  Result Date: 12/02/2016 2 view radiographs of the right hip shows stable internal fixation for the intertrochanteric right hip fracture.   Labs: Lab Results  Component Value Date   HGBA1C 6.3 (H) 10/30/2016   HGBA1C 6.5 07/20/2016   REPTSTATUS 01/15/2015 FINAL 01/11/2015   CULT  01/11/2015    >=100,000 COLONIES/mL PROTEUS MIRABILIS Performed at [facility] 01/11/2015    Orders:  Orders Placed This Encounter  Procedures  . XR HIP UNILAT W OR W/O PELVIS  2-3 VIEWS RIGHT   No orders of the defined types were placed in this encounter.    Procedures: No procedures performed  Clinical Data: No additional findings.  ROS:  All other systems negative, except as noted in the HPI. Review of Systems  Objective: Vital Signs: Ht 5\' 8"  (1.727 m)   Wt 177 lb (80.3 kg)   BMI 26.91 kg/m   Specialty Comments:  No specialty comments available.  PMFS History: Patient Active Problem List   Diagnosis Date Noted  . Pressure injury of skin 11/02/2016  . Hyperglycemia 10/31/2016  . Closed right hip fracture, initial encounter ([place]) 10/30/2016  . OSA on CPAP 10/30/2016  . Volume depletion   . GI bleed 11/20/2015  . Gastrointestinal hemorrhage associated with intestinal diverticulitis   . Diverticulitis of colon   . Intra-abdominal hematoma 01/06/2015  . Normocytic normochromic anemia 01/06/2015  . Nausea vomiting and diarrhea 01/06/2015  . Bleeding internal hemorrhoids 07/16/2014  . Anal fissure - posterior 06/17/2014  . Dementia 06/15/2014  . Generalized abdominal pain   . Abdominal pain   . Pancreatitis 05/24/2014  . Leukocytosis 05/24/2014  . GERD (gastroesophageal reflux disease) 05/24/2014  . Dementia with behavioral disturbance 02/27/2014  . Rectal bleeding 05/21/2013  . Hypokalemia 05/21/2013  . Depression 05/21/2013  . Invasive lobular carcinoma of breast, stage 1 ([place]) 06/23/2012  . Hypertension, essential, benign   .  Fibromyalgia   . Achalasia, esophageal    Past Medical History:  Diagnosis Date  . Anal fissure   . Arthritis   . Bilateral edema of lower extremity   . Cognitive dysfunction    DUE TO DEMENTIA--  PER NEUROLOGIST NOTE SEVERE  . Dementia with behavioral disturbance    PER NEUROLOGIST NOTE PT ABLE TO DO ADL'S BUT UNABLE TO CARE FOR HOME AND NEEDED AND PT HAS 24 HOUR CARE  . Dyslipidemia   . Fibromyalgia   . GERD (gastroesophageal reflux disease)   . Hemorrhoids   . History of breast cancer oncologist-   dr Humphrey Rolls--- no recurrence   dx 05-19-2012  Stage I , Invasive Lobular/ LCIS & microinvasive ductal and DCIS (T1 A NX M0)--  s/p  left partial mastectomy 07-04-2012 and radiation  . History of diverticulitis of colon   . History of radiation therapy 08-10-2012 to 09-08-2012   Left breast 750 cGy in 3 sessions  . History of squamous cell carcinoma excision    NOSE  . Hypertension   . OSA on CPAP   . Prolapsed internal hemorrhoids, grade 3   . Varicose veins     Family History  Problem Relation Age of Onset  . Cancer Mother 31       melanoma ca  . Arthritis Father   . Dementia Neg Hx     Past Surgical History:  Procedure Laterality Date  . BREAST LUMPECTOMY WITH NEEDLE LOCALIZATION  07/04/2012   Procedure: BREAST LUMPECTOMY WITH NEEDLE LOCALIZATION;  Surgeon: Edward Jolly, MD;  Location: [facility];  Service: General;  Laterality: Left;  left  . CHOLECYSTECTOMY  2007  . COLONOSCOPY    . ESOPHAGEAL DILATION    . HEMORRHOID BANDING  Mar & Apr 2016  . HEMORRHOID SURGERY N/A 12/25/2014   Procedure: HEMORRHOIDOPEXY WITH ANAL EXAM;  Surgeon: Leighton Ruff, MD;  Location: [facility];  Service: General;  Laterality: N/A;  . INTRAMEDULLARY (IM) NAIL INTERTROCHANTERIC Right 10/31/2016   Procedure: INTRAMEDULLARY (IM) NAIL INTERTROCHANTRIC;  Surgeon: [name], MD;  Location: [facility];  Service: Orthopedics;  Laterality: Right;  . RECTOCELE REPAIR    . TOTAL ABDOMINAL HYSTERECTOMY  1976  . VARICOSE VEIN SURGERY  2010   left lower extremity   Social History   Occupational History  . interior design   . Retired     Social History Main Topics  . Smoking status: Never Smoker  . Smokeless tobacco: Never Used  . Alcohol use No  . Drug use: No  . Sexual activity: Not on file

## 2016-12-07 ENCOUNTER — Emergency Department (HOSPITAL_COMMUNITY)
Admission: EM | Admit: 2016-12-07 | Discharge: 2016-12-07 | Disposition: A | Discharge status: Skilled Nursing Facility | Payer: Medicare Other | Attending: Emergency Medicine | Admitting: Emergency Medicine

## 2016-12-07 ENCOUNTER — Encounter (HOSPITAL_COMMUNITY): Payer: Self-pay | Admitting: Emergency Medicine

## 2016-12-07 ENCOUNTER — Emergency Department (HOSPITAL_COMMUNITY): Payer: Medicare Other

## 2016-12-07 DIAGNOSIS — Y9389 Activity, other specified: Secondary | ICD-10-CM | POA: Insufficient documentation

## 2016-12-07 DIAGNOSIS — R079 Chest pain, unspecified: Secondary | ICD-10-CM | POA: Diagnosis not present

## 2016-12-07 DIAGNOSIS — Z853 Personal history of malignant neoplasm of breast: Secondary | ICD-10-CM | POA: Diagnosis not present

## 2016-12-07 DIAGNOSIS — W010XXA Fall on same level from slipping, tripping and stumbling without subsequent striking against object, initial encounter: Secondary | ICD-10-CM | POA: Insufficient documentation

## 2016-12-07 DIAGNOSIS — Z7982 Long term (current) use of aspirin: Secondary | ICD-10-CM | POA: Insufficient documentation

## 2016-12-07 DIAGNOSIS — T148XXA Other injury of unspecified body region, initial encounter: Secondary | ICD-10-CM | POA: Diagnosis not present

## 2016-12-07 DIAGNOSIS — Y998 Other external cause status: Secondary | ICD-10-CM | POA: Insufficient documentation

## 2016-12-07 DIAGNOSIS — F419 Anxiety disorder, unspecified: Secondary | ICD-10-CM | POA: Diagnosis not present

## 2016-12-07 DIAGNOSIS — F0391 Unspecified dementia with behavioral disturbance: Secondary | ICD-10-CM | POA: Diagnosis not present

## 2016-12-07 DIAGNOSIS — Y92129 Unspecified place in nursing home as the place of occurrence of the external cause: Secondary | ICD-10-CM | POA: Diagnosis not present

## 2016-12-07 DIAGNOSIS — Z9049 Acquired absence of other specified parts of digestive tract: Secondary | ICD-10-CM | POA: Diagnosis not present

## 2016-12-07 DIAGNOSIS — R0781 Pleurodynia: Secondary | ICD-10-CM | POA: Diagnosis not present

## 2016-12-07 DIAGNOSIS — Z79899 Other long term (current) drug therapy: Secondary | ICD-10-CM | POA: Diagnosis not present

## 2016-12-07 DIAGNOSIS — L89891 Pressure ulcer of other site, stage 1: Secondary | ICD-10-CM | POA: Diagnosis not present

## 2016-12-07 DIAGNOSIS — S0990XA Unspecified injury of head, initial encounter: Secondary | ICD-10-CM | POA: Diagnosis not present

## 2016-12-07 DIAGNOSIS — W19XXXA Unspecified fall, initial encounter: Secondary | ICD-10-CM

## 2016-12-07 DIAGNOSIS — F0390 Unspecified dementia without behavioral disturbance: Secondary | ICD-10-CM | POA: Insufficient documentation

## 2016-12-07 DIAGNOSIS — M542 Cervicalgia: Secondary | ICD-10-CM | POA: Diagnosis not present

## 2016-12-07 DIAGNOSIS — G8911 Acute pain due to trauma: Secondary | ICD-10-CM | POA: Diagnosis not present

## 2016-12-07 DIAGNOSIS — S299XXA Unspecified injury of thorax, initial encounter: Secondary | ICD-10-CM | POA: Diagnosis not present

## 2016-12-07 NOTE — ED Notes (Signed)
Pt. resting with no distress/respirations unlabored , waiting for [facility] to transport pt. back to nursing home .

## 2016-12-07 NOTE — ED Provider Notes (Signed)
[facility] DEPT Provider Note   CSN: [id number] Arrival date & time: 12/07/16  1625     History   Chief Complaint Chief Complaint  Patient presents with  . Fall    HPI Kimberly Acevedo is a 83 y.o. female.  HPI   83 year old female who presents with unwitnessed fall. Patient has severe dementia at baseline and is unable to provide history. Patient denies any complaints at this time. Per EMS report and facility report, the patient reportedly was initially sitting down and then was found on the ground. She was singing on the ground at the time in no apparent distress. Staff does not believe there was loss of consciousness. She was in her usual state of health prior to and since this episode. She is currently recovering from a hip fracture but has been recovering well and has recently seen her orthopedist as well as primary doctor. No other recent medication changes. She is on blood thinner, however.  Level 5 caveat invoked as remainder of history, ROS, and physical exam limited due to patient's dementia.   Past Medical History:  Diagnosis Date  . Anal fissure   . Arthritis   . Bilateral edema of lower extremity   . Cognitive dysfunction    DUE TO DEMENTIA--  PER NEUROLOGIST NOTE SEVERE  . Dementia with behavioral disturbance    PER NEUROLOGIST NOTE PT ABLE TO DO ADL'S BUT UNABLE TO CARE FOR HOME AND NEEDED AND PT HAS 24 HOUR CARE  . Dyslipidemia   . Fibromyalgia   . GERD (gastroesophageal reflux disease)   . Hemorrhoids   . History of breast cancer oncologist-  dr Humphrey Rolls--- no recurrence   dx 05-19-2012  Stage I , Invasive Lobular/ LCIS & microinvasive ductal and DCIS (T1 A NX M0)--  s/p  left partial mastectomy 07-04-2012 and radiation  . History of diverticulitis of colon   . History of radiation therapy 08-10-2012 to 09-08-2012   Left breast 750 cGy in 3 sessions  . History of squamous cell carcinoma excision    NOSE  . Hypertension   . OSA on CPAP   . Prolapsed  internal hemorrhoids, grade 3   . Varicose veins     Patient Active Problem List   Diagnosis Date Noted  . Pressure injury of skin 11/02/2016  . Hyperglycemia 10/31/2016  . Closed right hip fracture, initial encounter ([place]) 10/30/2016  . OSA on CPAP 10/30/2016  . Volume depletion   . GI bleed 11/20/2015  . Gastrointestinal hemorrhage associated with intestinal diverticulitis   . Diverticulitis of colon   . Intra-abdominal hematoma 01/06/2015  . Normocytic normochromic anemia 01/06/2015  . Nausea vomiting and diarrhea 01/06/2015  . Bleeding internal hemorrhoids 07/16/2014  . Anal fissure - posterior 06/17/2014  . Dementia 06/15/2014  . Generalized abdominal pain   . Abdominal pain   . Pancreatitis 05/24/2014  . Leukocytosis 05/24/2014  . GERD (gastroesophageal reflux disease) 05/24/2014  . Dementia with behavioral disturbance 02/27/2014  . Rectal bleeding 05/21/2013  . Hypokalemia 05/21/2013  . Depression 05/21/2013  . Invasive lobular carcinoma of breast, stage 1 ([place]) 06/23/2012  . Hypertension, essential, benign   . Fibromyalgia   . Achalasia, esophageal     Past Surgical History:  Procedure Laterality Date  . BREAST LUMPECTOMY WITH NEEDLE LOCALIZATION  07/04/2012   Procedure: BREAST LUMPECTOMY WITH NEEDLE LOCALIZATION;  Surgeon: Edward Jolly, MD;  Location: [facility];  Service: General;  Laterality: Left;  left  . CHOLECYSTECTOMY  2007  .  COLONOSCOPY    . ESOPHAGEAL DILATION    . HEMORRHOID BANDING  Mar & Apr 2016  . HEMORRHOID SURGERY N/A 12/25/2014   Procedure: HEMORRHOIDOPEXY WITH ANAL EXAM;  Surgeon: Leighton Ruff, MD;  Location: [facility];  Service: General;  Laterality: N/A;  . INTRAMEDULLARY (IM) NAIL INTERTROCHANTERIC Right 10/31/2016   Procedure: INTRAMEDULLARY (IM) NAIL INTERTROCHANTRIC;  Surgeon: [name], MD;  Location: [facility];  Service: Orthopedics;  Laterality: Right;  . RECTOCELE REPAIR    . TOTAL ABDOMINAL  HYSTERECTOMY  1976  . VARICOSE VEIN SURGERY  2010   left lower extremity    OB History    No data available       Home Medications    Prior to Admission medications   Medication Sig Start Date End Date Taking? Authorizing Provider  acetaminophen (TYLENOL) 500 MG tablet Take 1 tablet (500 mg total) by mouth every 6 (six) hours as needed for mild pain. 10/31/16   [name], MD  aspirin 325 MG tablet Take 1 tablet (325 mg total) by mouth daily. 11/02/16   Reyne Dumas, MD  bisacodyl (DULCOLAX) 5 MG EC tablet Take 1 tablet (5 mg total) by mouth daily as needed for moderate constipation. 11/02/16   Reyne Dumas, MD  Cholecalciferol (VITAMIN D) 2000 UNITS CAPS Take 1 capsule by mouth daily.    [provider]  losartan (COZAAR) 50 MG tablet Take 1 tablet (50 mg total) by mouth daily. 11/02/16   Reyne Dumas, MD  polyethylene glycol (MIRALAX / GLYCOLAX) packet Take 17 g by mouth daily as needed for mild constipation. 11/02/16   Reyne Dumas, MD  sertraline (ZOLOFT) 100 MG tablet Take 100 mg by mouth daily. 07/13/16   [provider]  vitamin B-12 (CYANOCOBALAMIN) 1000 MCG tablet Take 1,000 mcg by mouth daily.    [provider]    Family History Family History  Problem Relation Age of Onset  . Cancer Mother 51       melanoma ca  . Arthritis Father   . Dementia Neg Hx     Social History Social History  Substance Use Topics  . Smoking status: Never Smoker  . Smokeless tobacco: Never Used  . Alcohol use No     Allergies   Penicillins; Statins; Sulfa drugs cross reactors; Tramadol hcl; Aspirin; Codeine; and Morphine and related   Review of Systems Review of Systems  Unable to perform ROS: Mental status change     Physical Exam Updated Vital Signs BP (!) 139/53 (BP Location: Left Arm)   Pulse 84   Temp 98.4 F (36.9 C) (Oral)   Resp 18   SpO2 97%   Physical Exam  Constitutional: She appears well-developed and well-nourished. No distress.   HENT:  Head: Normocephalic and atraumatic.  No apparent trauma to the head or neck  Eyes: Conjunctivae are normal.  Neck: Neck supple.  Cardiovascular: Normal rate, regular rhythm and normal heart sounds.  Exam reveals no friction rub.   No murmur heard. Pulmonary/Chest: Effort normal and breath sounds normal. No respiratory distress. She has no wheezes. She has no rales.  Abdominal: She exhibits no distension.  Musculoskeletal: She exhibits no edema.  Neurological: She exhibits normal muscle tone.  Alert, interactive. Moves all extremities with 5 out of 5 strength. Endorses normal sensation to light touch in bilateral upper and lower extremities. Face is symmetric. Speech appears normal, though confused.  Skin: Skin is warm. Capillary refill takes less than 2 seconds.  Psychiatric: She has a normal mood and affect.  Nursing note and vitals reviewed.    ED Treatments / Results  Labs (all labs ordered are listed, but only abnormal results are displayed) Labs Reviewed - No data to display  EKG  EKG Interpretation None       Radiology Dg Chest 2 View  Result Date: 12/07/2016 CLINICAL DATA:  Fall with acute right chest and rib pain. Initial encounter. EXAM: CHEST  2 VIEW COMPARISON:  11/21/2016 and prior exams FINDINGS: Upper limits normal heart size again noted. There is no evidence of focal airspace disease, pulmonary edema, suspicious pulmonary nodule/mass, pleural effusion, or pneumothorax. No acute bony abnormalities are identified. IMPRESSION: No active cardiopulmonary disease. Electronically Signed   By: Margarette Canada M.D.   On: 12/07/2016 17:26   Ct Head Wo Contrast  Result Date: 12/07/2016 CLINICAL DATA:  Unwitnessed fall from wheelchair today. Neck pain.History dementia, breast cancer, hypertension. EXAM: CT HEAD WITHOUT CONTRAST CT CERVICAL SPINE WITHOUT CONTRAST TECHNIQUE: Multidetector CT imaging of the head and cervical spine was performed following the standard protocol  without intravenous contrast. Multiplanar CT image reconstructions of the cervical spine were also generated. COMPARISON:  CT HEAD and cervical spine November 21, 2016 FINDINGS: CT HEAD FINDINGS- mildly motion degraded examination. BRAIN: No intraparenchymal hemorrhage, mass effect nor midline shift. Asymmetrically enlarged LEFT lateral ventricle, without hydrocephalus. Patchy supratentorial white matter hypodensities within normal range for patient's age, though non-specific are most compatible with chronic small vessel ischemic disease. No acute large vascular territory infarcts. No abnormal extra-axial fluid collections. Basal cisterns are patent. VASCULAR: Moderate calcific atherosclerosis of the carotid siphons. SKULL: No skull fracture. No significant scalp soft tissue swelling. SINUSES/ORBITS: Mild frontal sinus mucosal thickening without paranasal sinus air-fluid levels. Mastoid air cells are well aerated. The included ocular globes and orbital contents are non-suspicious. Status post bilateral ocular lens implants. OTHER: None. CT CERVICAL SPINE FINDINGS ALIGNMENT: Straightened lordosis. Vertebral bodies in alignment. SKULL BASE AND VERTEBRAE: Cervical vertebral bodies and posterior elements are intact. Severe C5-6 and C6-7 disc height loss, endplate sclerosis and marginal spurring compatible with degenerative discs. C6-7 auto interbody arthrodesis. Multilevel disc mineralization. C1-2 articulation maintained with severe arthropathy. Bilateral C2 through C4 facet arthrodesis on degenerative basis. Osteopenia without destructive bony lesions. SOFT TISSUES AND SPINAL CANAL: Nonacute. Punctate calcifications RIGHT thyroid lobe without dominant nodule. DISC LEVELS: Mild canal stenosis C6-7. Severe LEFT C4-5 through C6-7 neural foraminal narrowing, moderate to severe on the RIGHT at C6-7. UPPER CHEST: Lung apices are clear. OTHER: None. IMPRESSION: CT HEAD: 1. No acute intracranial process. 2. Stable examination:  Stable parenchymal brain volume loss and ventricle asymmetry, moderate chronic small vessel ischemic disease. CT CERVICAL SPINE: 1. No acute fracture or malalignment. 2. Mild canal stenosis C6-7. Severe C4-5 through C6-7 LEFT neural foraminal narrowing. Electronically Signed   By: Elon Alas M.D.   On: 12/07/2016 17:56   Ct Cervical Spine Wo Contrast  Result Date: 12/07/2016 CLINICAL DATA:  Unwitnessed fall from wheelchair today. Neck pain.History dementia, breast cancer, hypertension. EXAM: CT HEAD WITHOUT CONTRAST CT CERVICAL SPINE WITHOUT CONTRAST TECHNIQUE: Multidetector CT imaging of the head and cervical spine was performed following the standard protocol without intravenous contrast. Multiplanar CT image reconstructions of the cervical spine were also generated. COMPARISON:  CT HEAD and cervical spine November 21, 2016 FINDINGS: CT HEAD FINDINGS- mildly motion degraded examination. BRAIN: No intraparenchymal hemorrhage, mass effect nor midline shift. Asymmetrically enlarged LEFT lateral ventricle, without hydrocephalus. Patchy supratentorial white matter hypodensities  within normal range for patient's age, though non-specific are most compatible with chronic small vessel ischemic disease. No acute large vascular territory infarcts. No abnormal extra-axial fluid collections. Basal cisterns are patent. VASCULAR: Moderate calcific atherosclerosis of the carotid siphons. SKULL: No skull fracture. No significant scalp soft tissue swelling. SINUSES/ORBITS: Mild frontal sinus mucosal thickening without paranasal sinus air-fluid levels. Mastoid air cells are well aerated. The included ocular globes and orbital contents are non-suspicious. Status post bilateral ocular lens implants. OTHER: None. CT CERVICAL SPINE FINDINGS ALIGNMENT: Straightened lordosis. Vertebral bodies in alignment. SKULL BASE AND VERTEBRAE: Cervical vertebral bodies and posterior elements are intact. Severe C5-6 and C6-7 disc height loss,  endplate sclerosis and marginal spurring compatible with degenerative discs. C6-7 auto interbody arthrodesis. Multilevel disc mineralization. C1-2 articulation maintained with severe arthropathy. Bilateral C2 through C4 facet arthrodesis on degenerative basis. Osteopenia without destructive bony lesions. SOFT TISSUES AND SPINAL CANAL: Nonacute. Punctate calcifications RIGHT thyroid lobe without dominant nodule. DISC LEVELS: Mild canal stenosis C6-7. Severe LEFT C4-5 through C6-7 neural foraminal narrowing, moderate to severe on the RIGHT at C6-7. UPPER CHEST: Lung apices are clear. OTHER: None. IMPRESSION: CT HEAD: 1. No acute intracranial process. 2. Stable examination: Stable parenchymal brain volume loss and ventricle asymmetry, moderate chronic small vessel ischemic disease. CT CERVICAL SPINE: 1. No acute fracture or malalignment. 2. Mild canal stenosis C6-7. Severe C4-5 through C6-7 LEFT neural foraminal narrowing. Electronically Signed   By: Elon Alas M.D.   On: 12/07/2016 17:56    Procedures Procedures (including critical care time)  Medications Ordered in ED Medications - No data to display   Initial Impression / Assessment and Plan / ED Course  I have reviewed the triage vital signs and the nursing notes.  Pertinent labs & imaging results that were available during my care of the patient were reviewed by me and considered in my medical decision making (see chart for details).    83 yo Female with history of dementia here with right-sided chest pain after mechanical fall. Patient reportedly slipped out of her wheelchair. No significant trauma on exam. Plain films and CT scans are negative. Patient is at her neurological baseline according to report. We will discharge back to facility.  This note was prepared with assistance of Systems analyst. Occasional wrong-word or sound-a-like substitutions may have occurred due to the inherent limitations of voice recognition  software.   Final Clinical Impressions(s) / ED Diagnoses   Final diagnoses:  Fall, initial encounter    New Prescriptions Discharge Medication List as of 12/07/2016  6:00 PM       Duffy Bruce, MD 12/07/16 2340

## 2016-12-07 NOTE — ED Triage Notes (Signed)
Pt is from [facility] unit. Pt was left in chair by staff, then staff heard pt singing. Staff walked into room, pt laying on floor. Complaining of right rib pain. Unwitnessed fall. Pt on eliquis. Pt is oriented to her norm. HR 88, 97% on room air, 126/81. EMS denies any deformities. Pt in C collar

## 2016-12-07 NOTE — ED Notes (Signed)
Called [facility] to let them know pt is D/C and will be returning once [facility] arrives.

## 2016-12-07 NOTE — ED Notes (Signed)
Pt denies any pain.

## 2016-12-14 DIAGNOSIS — F0391 Unspecified dementia with behavioral disturbance: Secondary | ICD-10-CM | POA: Diagnosis not present

## 2016-12-14 DIAGNOSIS — I10 Essential (primary) hypertension: Secondary | ICD-10-CM | POA: Diagnosis not present

## 2016-12-14 DIAGNOSIS — R079 Chest pain, unspecified: Secondary | ICD-10-CM | POA: Diagnosis not present

## 2016-12-14 DIAGNOSIS — R109 Unspecified abdominal pain: Secondary | ICD-10-CM | POA: Diagnosis not present

## 2016-12-17 DIAGNOSIS — R1011 Right upper quadrant pain: Secondary | ICD-10-CM | POA: Diagnosis not present

## 2016-12-22 DIAGNOSIS — G4733 Obstructive sleep apnea (adult) (pediatric): Secondary | ICD-10-CM | POA: Diagnosis not present

## 2016-12-22 DIAGNOSIS — Z4889 Encounter for other specified surgical aftercare: Secondary | ICD-10-CM | POA: Diagnosis not present

## 2016-12-22 DIAGNOSIS — S72141D Displaced intertrochanteric fracture of right femur, subsequent encounter for closed fracture with routine healing: Secondary | ICD-10-CM | POA: Diagnosis not present

## 2016-12-22 DIAGNOSIS — Z4789 Encounter for other orthopedic aftercare: Secondary | ICD-10-CM | POA: Diagnosis not present

## 2017-01-04 DIAGNOSIS — F0391 Unspecified dementia with behavioral disturbance: Secondary | ICD-10-CM | POA: Diagnosis not present

## 2017-01-04 DIAGNOSIS — K5730 Diverticulosis of large intestine without perforation or abscess without bleeding: Secondary | ICD-10-CM | POA: Diagnosis not present

## 2017-01-04 DIAGNOSIS — F419 Anxiety disorder, unspecified: Secondary | ICD-10-CM | POA: Diagnosis not present

## 2017-01-04 DIAGNOSIS — F4320 Adjustment disorder, unspecified: Secondary | ICD-10-CM | POA: Diagnosis not present

## 2017-01-22 DIAGNOSIS — S72141D Displaced intertrochanteric fracture of right femur, subsequent encounter for closed fracture with routine healing: Secondary | ICD-10-CM | POA: Diagnosis not present

## 2017-01-22 DIAGNOSIS — Z4789 Encounter for other orthopedic aftercare: Secondary | ICD-10-CM | POA: Diagnosis not present

## 2017-01-22 DIAGNOSIS — Z4889 Encounter for other specified surgical aftercare: Secondary | ICD-10-CM | POA: Diagnosis not present

## 2017-01-22 DIAGNOSIS — G4733 Obstructive sleep apnea (adult) (pediatric): Secondary | ICD-10-CM | POA: Diagnosis not present

## 2017-01-25 DIAGNOSIS — S91201A Unspecified open wound of right great toe with damage to nail, initial encounter: Secondary | ICD-10-CM | POA: Diagnosis not present

## 2017-01-25 DIAGNOSIS — F0280 Dementia in other diseases classified elsewhere without behavioral disturbance: Secondary | ICD-10-CM | POA: Diagnosis not present

## 2017-01-25 DIAGNOSIS — I10 Essential (primary) hypertension: Secondary | ICD-10-CM | POA: Diagnosis not present

## 2017-02-02 DIAGNOSIS — I10 Essential (primary) hypertension: Secondary | ICD-10-CM | POA: Diagnosis not present

## 2017-02-02 DIAGNOSIS — F0390 Unspecified dementia without behavioral disturbance: Secondary | ICD-10-CM | POA: Diagnosis not present

## 2017-02-02 DIAGNOSIS — F419 Anxiety disorder, unspecified: Secondary | ICD-10-CM | POA: Diagnosis not present

## 2017-02-22 DIAGNOSIS — Z4889 Encounter for other specified surgical aftercare: Secondary | ICD-10-CM | POA: Diagnosis not present

## 2017-02-22 DIAGNOSIS — G4733 Obstructive sleep apnea (adult) (pediatric): Secondary | ICD-10-CM | POA: Diagnosis not present

## 2017-02-22 DIAGNOSIS — S72141D Displaced intertrochanteric fracture of right femur, subsequent encounter for closed fracture with routine healing: Secondary | ICD-10-CM | POA: Diagnosis not present

## 2017-02-22 DIAGNOSIS — Z4789 Encounter for other orthopedic aftercare: Secondary | ICD-10-CM | POA: Diagnosis not present

## 2017-03-18 DIAGNOSIS — M79662 Pain in left lower leg: Secondary | ICD-10-CM | POA: Diagnosis not present

## 2017-03-18 DIAGNOSIS — M79652 Pain in left thigh: Secondary | ICD-10-CM | POA: Diagnosis not present

## 2017-03-18 DIAGNOSIS — M25572 Pain in left ankle and joints of left foot: Secondary | ICD-10-CM | POA: Diagnosis not present

## 2017-03-18 DIAGNOSIS — M25562 Pain in left knee: Secondary | ICD-10-CM | POA: Diagnosis not present

## 2017-03-22 DIAGNOSIS — I10 Essential (primary) hypertension: Secondary | ICD-10-CM | POA: Diagnosis not present

## 2017-03-22 DIAGNOSIS — F0281 Dementia in other diseases classified elsewhere with behavioral disturbance: Secondary | ICD-10-CM | POA: Diagnosis not present

## 2017-03-22 DIAGNOSIS — S72002S Fracture of unspecified part of neck of left femur, sequela: Secondary | ICD-10-CM | POA: Diagnosis not present

## 2017-03-22 DIAGNOSIS — K5790 Diverticulosis of intestine, part unspecified, without perforation or abscess without bleeding: Secondary | ICD-10-CM | POA: Diagnosis not present

## 2017-03-24 DIAGNOSIS — G4733 Obstructive sleep apnea (adult) (pediatric): Secondary | ICD-10-CM | POA: Diagnosis not present

## 2017-03-24 DIAGNOSIS — Z4789 Encounter for other orthopedic aftercare: Secondary | ICD-10-CM | POA: Diagnosis not present

## 2017-03-24 DIAGNOSIS — Z4889 Encounter for other specified surgical aftercare: Secondary | ICD-10-CM | POA: Diagnosis not present

## 2017-03-24 DIAGNOSIS — S72141D Displaced intertrochanteric fracture of right femur, subsequent encounter for closed fracture with routine healing: Secondary | ICD-10-CM | POA: Diagnosis not present

## 2017-03-28 DIAGNOSIS — F4320 Adjustment disorder, unspecified: Secondary | ICD-10-CM | POA: Diagnosis not present

## 2017-03-28 DIAGNOSIS — F0281 Dementia in other diseases classified elsewhere with behavioral disturbance: Secondary | ICD-10-CM | POA: Diagnosis not present

## 2017-03-28 DIAGNOSIS — F419 Anxiety disorder, unspecified: Secondary | ICD-10-CM | POA: Diagnosis not present

## 2017-03-28 DIAGNOSIS — G309 Alzheimer's disease, unspecified: Secondary | ICD-10-CM | POA: Diagnosis not present

## 2017-03-29 DIAGNOSIS — Z79899 Other long term (current) drug therapy: Secondary | ICD-10-CM | POA: Diagnosis not present

## 2017-03-29 DIAGNOSIS — M79609 Pain in unspecified limb: Secondary | ICD-10-CM | POA: Diagnosis not present

## 2017-03-29 DIAGNOSIS — I10 Essential (primary) hypertension: Secondary | ICD-10-CM | POA: Diagnosis not present

## 2017-03-29 DIAGNOSIS — S72002S Fracture of unspecified part of neck of left femur, sequela: Secondary | ICD-10-CM | POA: Diagnosis not present

## 2017-03-31 ENCOUNTER — Inpatient Hospital Stay (HOSPITAL_COMMUNITY)
Admission: EM | Admit: 2017-03-31 | Discharge: 2017-04-04 | DRG: 470 | Disposition: A | Discharge status: Home Health | Payer: Medicare Other | Attending: Family Medicine | Admitting: Family Medicine

## 2017-03-31 ENCOUNTER — Other Ambulatory Visit (INDEPENDENT_AMBULATORY_CARE_PROVIDER_SITE_OTHER): Payer: Self-pay | Admitting: Family

## 2017-03-31 ENCOUNTER — Ambulatory Visit (INDEPENDENT_AMBULATORY_CARE_PROVIDER_SITE_OTHER): Payer: Medicare Other

## 2017-03-31 ENCOUNTER — Encounter (INDEPENDENT_AMBULATORY_CARE_PROVIDER_SITE_OTHER): Payer: Self-pay | Admitting: Orthopedic Surgery

## 2017-03-31 ENCOUNTER — Inpatient Hospital Stay (HOSPITAL_COMMUNITY): Payer: Medicare Other

## 2017-03-31 ENCOUNTER — Ambulatory Visit (INDEPENDENT_AMBULATORY_CARE_PROVIDER_SITE_OTHER): Payer: Medicare Other | Admitting: Orthopedic Surgery

## 2017-03-31 ENCOUNTER — Encounter (HOSPITAL_COMMUNITY): Payer: Self-pay | Admitting: *Deleted

## 2017-03-31 DIAGNOSIS — M25551 Pain in right hip: Secondary | ICD-10-CM | POA: Diagnosis not present

## 2017-03-31 DIAGNOSIS — I10 Essential (primary) hypertension: Secondary | ICD-10-CM | POA: Diagnosis present

## 2017-03-31 DIAGNOSIS — Z9989 Dependence on other enabling machines and devices: Secondary | ICD-10-CM

## 2017-03-31 DIAGNOSIS — Z853 Personal history of malignant neoplasm of breast: Secondary | ICD-10-CM

## 2017-03-31 DIAGNOSIS — F0281 Dementia in other diseases classified elsewhere with behavioral disturbance: Secondary | ICD-10-CM | POA: Diagnosis not present

## 2017-03-31 DIAGNOSIS — K219 Gastro-esophageal reflux disease without esophagitis: Secondary | ICD-10-CM | POA: Diagnosis present

## 2017-03-31 DIAGNOSIS — G8911 Acute pain due to trauma: Secondary | ICD-10-CM | POA: Diagnosis not present

## 2017-03-31 DIAGNOSIS — C50919 Malignant neoplasm of unspecified site of unspecified female breast: Secondary | ICD-10-CM | POA: Diagnosis present

## 2017-03-31 DIAGNOSIS — Z880 Allergy status to penicillin: Secondary | ICD-10-CM | POA: Diagnosis not present

## 2017-03-31 DIAGNOSIS — F0391 Unspecified dementia with behavioral disturbance: Secondary | ICD-10-CM | POA: Diagnosis present

## 2017-03-31 DIAGNOSIS — Z885 Allergy status to narcotic agent status: Secondary | ICD-10-CM

## 2017-03-31 DIAGNOSIS — M797 Fibromyalgia: Secondary | ICD-10-CM | POA: Diagnosis present

## 2017-03-31 DIAGNOSIS — Y92129 Unspecified place in nursing home as the place of occurrence of the external cause: Secondary | ICD-10-CM

## 2017-03-31 DIAGNOSIS — Z923 Personal history of irradiation: Secondary | ICD-10-CM | POA: Diagnosis not present

## 2017-03-31 DIAGNOSIS — G4733 Obstructive sleep apnea (adult) (pediatric): Secondary | ICD-10-CM | POA: Diagnosis present

## 2017-03-31 DIAGNOSIS — Z66 Do not resuscitate: Secondary | ICD-10-CM | POA: Diagnosis present

## 2017-03-31 DIAGNOSIS — E785 Hyperlipidemia, unspecified: Secondary | ICD-10-CM | POA: Diagnosis not present

## 2017-03-31 DIAGNOSIS — S72002A Fracture of unspecified part of neck of left femur, initial encounter for closed fracture: Secondary | ICD-10-CM

## 2017-03-31 DIAGNOSIS — K220 Achalasia of cardia: Secondary | ICD-10-CM | POA: Diagnosis not present

## 2017-03-31 DIAGNOSIS — M810 Age-related osteoporosis without current pathological fracture: Secondary | ICD-10-CM | POA: Diagnosis not present

## 2017-03-31 DIAGNOSIS — W19XXXA Unspecified fall, initial encounter: Secondary | ICD-10-CM | POA: Diagnosis present

## 2017-03-31 DIAGNOSIS — S72002K Fracture of unspecified part of neck of left femur, subsequent encounter for closed fracture with nonunion: Secondary | ICD-10-CM | POA: Diagnosis not present

## 2017-03-31 DIAGNOSIS — F32A Depression, unspecified: Secondary | ICD-10-CM | POA: Diagnosis present

## 2017-03-31 DIAGNOSIS — R918 Other nonspecific abnormal finding of lung field: Secondary | ICD-10-CM | POA: Diagnosis not present

## 2017-03-31 DIAGNOSIS — Z888 Allergy status to other drugs, medicaments and biological substances status: Secondary | ICD-10-CM | POA: Diagnosis not present

## 2017-03-31 DIAGNOSIS — K922 Gastrointestinal hemorrhage, unspecified: Secondary | ICD-10-CM | POA: Diagnosis not present

## 2017-03-31 DIAGNOSIS — Z7982 Long term (current) use of aspirin: Secondary | ICD-10-CM

## 2017-03-31 DIAGNOSIS — F0151 Vascular dementia with behavioral disturbance: Secondary | ICD-10-CM | POA: Diagnosis not present

## 2017-03-31 DIAGNOSIS — F0390 Unspecified dementia without behavioral disturbance: Secondary | ICD-10-CM | POA: Diagnosis present

## 2017-03-31 DIAGNOSIS — M25552 Pain in left hip: Secondary | ICD-10-CM

## 2017-03-31 DIAGNOSIS — F329 Major depressive disorder, single episode, unspecified: Secondary | ICD-10-CM | POA: Diagnosis present

## 2017-03-31 DIAGNOSIS — Z85828 Personal history of other malignant neoplasm of skin: Secondary | ICD-10-CM

## 2017-03-31 DIAGNOSIS — G309 Alzheimer's disease, unspecified: Secondary | ICD-10-CM | POA: Diagnosis present

## 2017-03-31 DIAGNOSIS — Z01818 Encounter for other preprocedural examination: Secondary | ICD-10-CM | POA: Diagnosis not present

## 2017-03-31 DIAGNOSIS — Z79899 Other long term (current) drug therapy: Secondary | ICD-10-CM

## 2017-03-31 DIAGNOSIS — S92153A Displaced avulsion fracture (chip fracture) of unspecified talus, initial encounter for closed fracture: Secondary | ICD-10-CM | POA: Diagnosis not present

## 2017-03-31 DIAGNOSIS — K648 Other hemorrhoids: Secondary | ICD-10-CM | POA: Diagnosis not present

## 2017-03-31 DIAGNOSIS — T148XXA Other injury of unspecified body region, initial encounter: Secondary | ICD-10-CM | POA: Diagnosis not present

## 2017-03-31 DIAGNOSIS — F03918 Unspecified dementia, unspecified severity, with other behavioral disturbance: Secondary | ICD-10-CM | POA: Diagnosis present

## 2017-03-31 DIAGNOSIS — S72042A Displaced fracture of base of neck of left femur, initial encounter for closed fracture: Secondary | ICD-10-CM | POA: Diagnosis not present

## 2017-03-31 DIAGNOSIS — K5732 Diverticulitis of large intestine without perforation or abscess without bleeding: Secondary | ICD-10-CM | POA: Diagnosis present

## 2017-03-31 DIAGNOSIS — Z886 Allergy status to analgesic agent status: Secondary | ICD-10-CM

## 2017-03-31 DIAGNOSIS — R9431 Abnormal electrocardiogram [ECG] [EKG]: Secondary | ICD-10-CM | POA: Diagnosis not present

## 2017-03-31 DIAGNOSIS — Z8781 Personal history of (healed) traumatic fracture: Secondary | ICD-10-CM

## 2017-03-31 LAB — CBC WITH DIFFERENTIAL/PLATELET
BASOS PCT: 0 %
Basophils Absolute: 0 10*3/uL (ref 0.0–0.1)
EOS ABS: 0 10*3/uL (ref 0.0–0.7)
Eosinophils Relative: 0 %
HEMATOCRIT: 44.8 % (ref 36.0–46.0)
Hemoglobin: 15 g/dL (ref 12.0–15.0)
Lymphocytes Relative: 24 %
Lymphs Abs: 2.4 10*3/uL (ref 0.7–4.0)
MCH: 31.8 pg (ref 26.0–34.0)
MCHC: 33.5 g/dL (ref 30.0–36.0)
MCV: 95.1 fL (ref 78.0–100.0)
MONO ABS: 0.6 10*3/uL (ref 0.1–1.0)
MONOS PCT: 6 %
Neutro Abs: 7 10*3/uL (ref 1.7–7.7)
Neutrophils Relative %: 70 %
Platelets: 330 10*3/uL (ref 150–400)
RBC: 4.71 MIL/uL (ref 3.87–5.11)
RDW: 13.2 % (ref 11.5–15.5)
WBC: 10.1 10*3/uL (ref 4.0–10.5)

## 2017-03-31 LAB — PROTIME-INR
INR: 1.03
Prothrombin Time: 13.4 seconds (ref 11.4–15.2)

## 2017-03-31 LAB — BASIC METABOLIC PANEL
Anion gap: 10 (ref 5–15)
BUN: 15 mg/dL (ref 6–20)
CALCIUM: 8.8 mg/dL — AB (ref 8.9–10.3)
CO2: 27 mmol/L (ref 22–32)
CREATININE: 0.62 mg/dL (ref 0.44–1.00)
Chloride: 101 mmol/L (ref 101–111)
GFR calc Af Amer: >60 mL/min (ref >60)
GFR calc non Af Amer: >60 mL/min (ref >60)
GLUCOSE: 102 mg/dL — AB (ref 65–99)
Potassium: 3.4 mmol/L — ABNORMAL LOW (ref 3.5–5.1)
Sodium: 138 mmol/L (ref 135–145)

## 2017-03-31 LAB — TYPE AND SCREEN
ABO/RH(D): O POS
Antibody Screen: NEGATIVE

## 2017-03-31 LAB — ABO/RH: ABO/RH(D): O POS

## 2017-03-31 LAB — APTT: aPTT: 30 seconds (ref 24–36)

## 2017-03-31 MED ORDER — SODIUM CHLORIDE 0.9 % IV SOLN
INTRAVENOUS | Status: DC
Start: 2017-03-31 — End: 2017-04-04
  Administered 2017-03-31 – 2017-04-01 (×2): via INTRAVENOUS

## 2017-03-31 MED ORDER — CHLORHEXIDINE GLUCONATE 4 % EX LIQD
60.0000 mL | Freq: Once | CUTANEOUS | Status: DC
Start: 2017-04-01 — End: 2017-04-01

## 2017-03-31 MED ORDER — LOSARTAN POTASSIUM 50 MG PO TABS
50.0000 mg | ORAL_TABLET | Freq: Every day | ORAL | Status: DC
  Administered 2017-04-02 – 2017-04-04 (×3): 50 mg via ORAL
  Filled 2017-03-31 (×3): qty 1

## 2017-03-31 MED ORDER — ACETAMINOPHEN 325 MG PO TABS
650.0000 mg | ORAL_TABLET | Freq: Four times a day (QID) | ORAL | Status: DC | PRN
  Administered 2017-04-02: 650 mg via ORAL
  Filled 2017-03-31 (×2): qty 2

## 2017-03-31 MED ORDER — POTASSIUM CHLORIDE CRYS ER 20 MEQ PO TBCR
40.0000 meq | EXTENDED_RELEASE_TABLET | Freq: Once | ORAL | Status: DC
  Filled 2017-03-31: qty 2

## 2017-03-31 MED ORDER — SENNOSIDES-DOCUSATE SODIUM 8.6-50 MG PO TABS: 1.0000 | ORAL_TABLET | Freq: Every evening | ORAL | Status: DC | PRN

## 2017-03-31 MED ORDER — FLEET ENEMA 7-19 GM/118ML RE ENEM: 1.0000 | ENEMA | Freq: Once | RECTAL | Status: DC | PRN

## 2017-03-31 MED ORDER — VITAMIN B-12 1000 MCG PO TABS
1000.0000 ug | ORAL_TABLET | Freq: Every day | ORAL | Status: DC
  Administered 2017-04-02 – 2017-04-04 (×3): 1000 ug via ORAL
  Filled 2017-03-31 (×3): qty 1

## 2017-03-31 MED ORDER — BISACODYL 10 MG RE SUPP: 10.0000 mg | Freq: Every day | RECTAL | Status: DC | PRN

## 2017-03-31 MED ORDER — CLINDAMYCIN PHOSPHATE 900 MG/50ML IV SOLN
900.0000 mg | INTRAVENOUS | Status: AC
  Administered 2017-04-01: 900 mg via INTRAVENOUS
  Filled 2017-03-31: qty 50

## 2017-03-31 MED ORDER — ACETAMINOPHEN 650 MG RE SUPP: 650.0000 mg | Freq: Four times a day (QID) | RECTAL | Status: DC | PRN

## 2017-03-31 MED ORDER — VITAMIN D 1000 UNITS PO TABS
2000.0000 [IU] | ORAL_TABLET | Freq: Every day | ORAL | Status: DC
  Administered 2017-04-02 – 2017-04-04 (×3): 2000 [IU] via ORAL
  Filled 2017-03-31 (×3): qty 2

## 2017-03-31 MED ORDER — FENTANYL CITRATE (PF) 100 MCG/2ML IJ SOLN
50.0000 ug | INTRAMUSCULAR | Status: AC | PRN
  Administered 2017-03-31 (×2): 50 ug via INTRAVENOUS
  Filled 2017-03-31 (×2): qty 2

## 2017-03-31 MED ORDER — SERTRALINE HCL 100 MG PO TABS
100.0000 mg | ORAL_TABLET | Freq: Every day | ORAL | Status: DC
  Administered 2017-04-02 – 2017-04-04 (×3): 100 mg via ORAL
  Filled 2017-03-31 (×3): qty 1

## 2017-03-31 MED ORDER — ONDANSETRON HCL 4 MG/2ML IJ SOLN: 4.0000 mg | Freq: Four times a day (QID) | INTRAMUSCULAR | Status: DC | PRN

## 2017-03-31 MED ORDER — ONDANSETRON HCL 4 MG PO TABS: 4.0000 mg | ORAL_TABLET | Freq: Four times a day (QID) | ORAL | Status: DC | PRN

## 2017-03-31 MED ORDER — SODIUM CHLORIDE 0.9 % IV SOLN: INTRAVENOUS | Status: DC

## 2017-03-31 MED ORDER — BISACODYL 5 MG PO TBEC
5.0000 mg | DELAYED_RELEASE_TABLET | Freq: Every day | ORAL | Status: DC | PRN
  Administered 2017-04-03: 5 mg via ORAL
  Filled 2017-03-31: qty 1

## 2017-03-31 NOTE — Progress Notes (Signed)
Orthopedic Tech Progress Note Patient Details:  Kimberly Acevedo [birth year] [id number]  Patient ID: Kimberly Acevedo, female   DOB: 04-20-1929, 83 y.o.   MRN: [id number] Pt cant have ohf due to age restriction.  [name] 03/31/2017, 10:57 PM

## 2017-03-31 NOTE — ED Triage Notes (Signed)
Per EMS, pt Kimberly Acevedo Nsg home.  Staff reports fall x 1 week ago, +L hip fx.  Has not been given any pain med.  Pt was transported to her doctor this am d/t severe pain.  Was transported to the ED for pain mgt and possible surgery tomorrow.  Pt has hx of Dementia.

## 2017-03-31 NOTE — ED Notes (Addendum)
Family at bedside.  Is aware of patient care plan.

## 2017-03-31 NOTE — Progress Notes (Signed)
Patient is unable to agree or disagree with wearing CPAP due to her dementia.  Patient states that she doesn't wear CPAP (mask on face) at home. Patient vitals are within normal limits.  RN is at bedside and will call if something changes to apply the CPAP on patient.

## 2017-03-31 NOTE — Progress Notes (Signed)
Office Visit Note   Patient: Kimberly Acevedo           Date of Birth: 1928-09-16           MRN: [id number] Visit Date: 03/31/2017              Requested by: Jacklyn Shell, [address] [employer] [place], [place] [address] PCP: Deland Pretty, MD  Chief Complaint  Patient presents with  . Right Hip - Pain      HPI: Patient is an 83 year old woman with multiple medical problems including Alzheimer's dementia who is been having subacute left hip pain.  She is status post internal fixation for a right intertrochanteric hip fracture and has been having left hip pain.  She calls out in pain.  No known injury from the skilled nursing facility no known falls she has been trying to work with physical therapy but is unable to ambulate even with assistance.  Assessment & Plan: Visit Diagnoses:  1. Pain in right hip   2. Pain in left hip   3. Closed displaced fracture of left femoral neck (HCC)     Plan: Discussed with the patient's daughter who is the power of attorney options for the left hip options include left hip hemiarthroplasty versus observation and pain medicine for pain management.  Discussed the increased risks of infection neurovascular injury DVT dislocation need for additional surgery for the left hip.  Discussed the patient would not be able to ambulate due to her dementia.  Patient's daughter states she understands and would like to proceed with left hip hemiarthroplasty.  We will have the patient admitted through the hospital today of [facility] to the hospitalist service and plan for left hip hemiarthroplasty tomorrow Friday.  Follow-Up Instructions: Return in about 2 weeks (around 04/14/2017).   Ortho Exam  Patient is not oriented, normal respiratory effort. On examination patient has dementia she is not alert not oriented.  She has pain with any attempted range of motion of the left hip there is no pain with range of motion of the right hip.  There is no skin  breakdown.  Imaging: Xr Hip Unilat W Or W/o Pelvis 2-3 Views Left  Result Date: 03/31/2017 2 view radiographs of the left hip shows a displaced impacted femoral neck fracture with displacement.  Xr Hip Unilat W Or W/o Pelvis 2-3 Views Right  Result Date: 03/31/2017 2 view radiographs of the right hip shows stable internal fixation of the intertrochanteric hip fracture.  No complicating features no hardware failure.  No images are attached to the encounter.  Labs: Lab Results  Component Value Date   HGBA1C 6.3 (H) 10/30/2016   HGBA1C 6.5 07/20/2016   REPTSTATUS 01/15/2015 FINAL 01/11/2015   CULT  01/11/2015    >=100,000 COLONIES/mL PROTEUS MIRABILIS Performed at [facility] 01/11/2015    Orders:  Orders Placed This Encounter  Procedures  . XR HIP UNILAT W OR W/O PELVIS 2-3 VIEWS RIGHT  . XR HIP UNILAT W OR W/O PELVIS 2-3 VIEWS LEFT   No orders of the defined types were placed in this encounter.    Procedures: No procedures performed  Clinical Data: No additional findings.  ROS:  All other systems negative, except as noted in the HPI. Review of Systems  Objective: Vital Signs: There were no vitals taken for this visit.  Specialty Comments:  No specialty comments available.  PMFS History: Patient Active Problem List   Diagnosis Date  Noted  . Pressure injury of skin 11/02/2016  . Hyperglycemia 10/31/2016  . Closed displaced fracture of left femoral neck ([place]) 10/30/2016  . OSA on CPAP 10/30/2016  . Volume depletion   . GI bleed 11/20/2015  . Gastrointestinal hemorrhage associated with intestinal diverticulitis   . Diverticulitis of colon   . Intra-abdominal hematoma 01/06/2015  . Normocytic normochromic anemia 01/06/2015  . Nausea vomiting and diarrhea 01/06/2015  . Bleeding internal hemorrhoids 07/16/2014  . Anal fissure - posterior 06/17/2014  . Dementia 06/15/2014  . Generalized abdominal pain   . Abdominal  pain   . Pancreatitis 05/24/2014  . Leukocytosis 05/24/2014  . GERD (gastroesophageal reflux disease) 05/24/2014  . Dementia with behavioral disturbance 02/27/2014  . Rectal bleeding 05/21/2013  . Hypokalemia 05/21/2013  . Depression 05/21/2013  . Invasive lobular carcinoma of breast, stage 1 ([place]) 06/23/2012  . Hypertension, essential, benign   . Fibromyalgia   . Achalasia, esophageal    Past Medical History:  Diagnosis Date  . Anal fissure   . Arthritis   . Bilateral edema of lower extremity   . Cognitive dysfunction    DUE TO DEMENTIA--  PER NEUROLOGIST NOTE SEVERE  . Dementia with behavioral disturbance    PER NEUROLOGIST NOTE PT ABLE TO DO ADL'S BUT UNABLE TO CARE FOR HOME AND NEEDED AND PT HAS 24 HOUR CARE  . Dyslipidemia   . Fibromyalgia   . GERD (gastroesophageal reflux disease)   . Hemorrhoids   . History of breast cancer oncologist-  dr Humphrey Rolls--- no recurrence   dx 05-19-2012  Stage I , Invasive Lobular/ LCIS & microinvasive ductal and DCIS (T1 A NX M0)--  s/p  left partial mastectomy 07-04-2012 and radiation  . History of diverticulitis of colon   . History of radiation therapy 08-10-2012 to 09-08-2012   Left breast 750 cGy in 3 sessions  . History of squamous cell carcinoma excision    NOSE  . Hypertension   . OSA on CPAP   . Prolapsed internal hemorrhoids, grade 3   . Varicose veins     Family History  Problem Relation Age of Onset  . Cancer Mother 19       melanoma ca  . Arthritis Father   . Dementia Neg Hx     Past Surgical History:  Procedure Laterality Date  . BREAST LUMPECTOMY WITH NEEDLE LOCALIZATION  07/04/2012   Procedure: BREAST LUMPECTOMY WITH NEEDLE LOCALIZATION;  Surgeon: Edward Jolly, MD;  Location: [facility];  Service: General;  Laterality: Left;  left  . CHOLECYSTECTOMY  2007  . COLONOSCOPY    . ESOPHAGEAL DILATION    . HEMORRHOID BANDING  Mar & Apr 2016  . HEMORRHOID SURGERY N/A 12/25/2014   Procedure:  HEMORRHOIDOPEXY WITH ANAL EXAM;  Surgeon: Leighton Ruff, MD;  Location: [facility];  Service: General;  Laterality: N/A;  . INTRAMEDULLARY (IM) NAIL INTERTROCHANTERIC Right 10/31/2016   Procedure: INTRAMEDULLARY (IM) NAIL INTERTROCHANTRIC;  Surgeon: [name], MD;  Location: [facility];  Service: Orthopedics;  Laterality: Right;  . RECTOCELE REPAIR    . TOTAL ABDOMINAL HYSTERECTOMY  1976  . VARICOSE VEIN SURGERY  2010   left lower extremity   Social History   Occupational History  . interior design   . Retired     Social History Main Topics  . Smoking status: Never Smoker  . Smokeless tobacco: Never Used  . Alcohol use No  . Drug use: No  . Sexual activity: Not on  file

## 2017-03-31 NOTE — ED Notes (Signed)
Attempted to adm PO K-dur in a pill and liquid form without success.   Pt spits it out.  [facility] EDP notified and states to hold off for now.

## 2017-03-31 NOTE — ED Provider Notes (Signed)
[facility] EMERGENCY DEPARTMENT Provider Note   CSN: [id number] Arrival date & time: 03/31/17  1219  LEVEL 5 CAVEAT - DEMENTIA   History   Chief Complaint Chief Complaint  Patient presents with  . Hip Pain  . Fall    HPI Kimberly Acevedo is a 83 y.o. female.  HPI  83 year old sent into the ER for admission for a left hip fracture.  History is taken from the daughter over the phone.  Patient apparently had a fall a couple weeks ago in her facility.  X-rays were obtained about a week ago that showed a "femur fracture".  The patient's power of attorney and daughter, interpret this to not be serious until the patient was still having pain and taken to her orthopedist, Dr. Sharol Given, today.  X-rays showed an impacted femoral neck fracture with displacement.  She was then sent over here for admission for operation tomorrow.  The patient is demented and has no current complaints.  Past Medical History:  Diagnosis Date  . Anal fissure   . Arthritis   . Bilateral edema of lower extremity   . Cognitive dysfunction    DUE TO DEMENTIA--  PER NEUROLOGIST NOTE SEVERE  . Dementia with behavioral disturbance    PER NEUROLOGIST NOTE PT ABLE TO DO ADL'S BUT UNABLE TO CARE FOR HOME AND NEEDED AND PT HAS 24 HOUR CARE  . Dyslipidemia   . Fibromyalgia   . GERD (gastroesophageal reflux disease)   . Hemorrhoids   . History of breast cancer oncologist-  dr Humphrey Rolls--- no recurrence   dx 05-19-2012  Stage I , Invasive Lobular/ LCIS & microinvasive ductal and DCIS (T1 A NX M0)--  s/p  left partial mastectomy 07-04-2012 and radiation  . History of diverticulitis of colon   . History of radiation therapy 08-10-2012 to 09-08-2012   Left breast 750 cGy in 3 sessions  . History of squamous cell carcinoma excision    NOSE  . Hypertension   . OSA on CPAP   . Prolapsed internal hemorrhoids, grade 3   . Varicose veins     Patient Active Problem List   Diagnosis Date Noted  . S/P right hip  fracture 03/31/2017  . Pressure injury of skin 11/02/2016  . Hyperglycemia 10/31/2016  . Closed displaced fracture of left femoral neck ([place]) 10/30/2016  . OSA on CPAP 10/30/2016  . Volume depletion   . GI bleed 11/20/2015  . Gastrointestinal hemorrhage associated with intestinal diverticulitis   . Diverticulitis of colon   . Intra-abdominal hematoma 01/06/2015  . Normocytic normochromic anemia 01/06/2015  . Nausea vomiting and diarrhea 01/06/2015  . Bleeding internal hemorrhoids 07/16/2014  . Anal fissure - posterior 06/17/2014  . Dementia 06/15/2014  . Generalized abdominal pain   . Abdominal pain   . Pancreatitis 05/24/2014  . Leukocytosis 05/24/2014  . GERD (gastroesophageal reflux disease) 05/24/2014  . Dementia with behavioral disturbance 02/27/2014  . Rectal bleeding 05/21/2013  . Hypokalemia 05/21/2013  . Depression 05/21/2013  . Invasive lobular carcinoma of breast, stage 1 ([place]) 06/23/2012  . Hypertension, essential, benign   . Fibromyalgia   . Achalasia, esophageal     Past Surgical History:  Procedure Laterality Date  . BREAST LUMPECTOMY WITH NEEDLE LOCALIZATION  07/04/2012   Procedure: BREAST LUMPECTOMY WITH NEEDLE LOCALIZATION;  Surgeon: Edward Jolly, MD;  Location: [place];  Service: General;  Laterality: Left;  left  . CHOLECYSTECTOMY  2007  . COLONOSCOPY    . ESOPHAGEAL  DILATION    . HEMORRHOID BANDING  Mar & Apr 2016  . HEMORRHOID SURGERY N/A 12/25/2014   Procedure: HEMORRHOIDOPEXY WITH ANAL EXAM;  Surgeon: Leighton Ruff, MD;  Location[facility];  Service: General;  Laterality: N/A;  . INTRAMEDULLARY (IM) NAIL INTERTROCHANTERIC Right 10/31/2016   Procedure: INTRAMEDULLARY (IM) NAIL INTERTROCHANTRIC;  Surgeon: [name], MD;  Location: [place];  Service: Orthopedics;  Laterality: Right;  . RECTOCELE REPAIR    . TOTAL ABDOMINAL HYSTERECTOMY  1976  . VARICOSE VEIN SURGERY  2010   left lower extremity    OB  History    No data available       Home Medications    Prior to Admission medications   Medication Sig Start Date End Date Taking? Authorizing Provider  acetaminophen (TYLENOL) 500 MG tablet Take 1 tablet (500 mg total) by mouth every 6 (six) hours as needed for mild pain. 10/31/16   [name], MD  aspirin 325 MG tablet Take 1 tablet (325 mg total) by mouth daily. 11/02/16   Reyne Dumas, MD  bisacodyl (DULCOLAX) 5 MG EC tablet Take 1 tablet (5 mg total) by mouth daily as needed for moderate constipation. 11/02/16   Reyne Dumas, MD  Cholecalciferol (VITAMIN D) 2000 UNITS CAPS Take 1 capsule by mouth daily.    [provider]  losartan (COZAAR) 50 MG tablet Take 1 tablet (50 mg total) by mouth daily. 11/02/16   Reyne Dumas, MD  polyethylene glycol (MIRALAX / GLYCOLAX) packet Take 17 g by mouth daily as needed for mild constipation. 11/02/16   Reyne Dumas, MD  sertraline (ZOLOFT) 100 MG tablet Take 100 mg by mouth daily. 07/13/16   [provider]  vitamin B-12 (CYANOCOBALAMIN) 1000 MCG tablet Take 1,000 mcg by mouth daily.    [provider]    Family History Family History  Problem Relation Age of Onset  . Cancer Mother 27       melanoma ca  . Arthritis Father   . Dementia Neg Hx     Social History Social History  Substance Use Topics  . Smoking status: Never Smoker  . Smokeless tobacco: Never Used  . Alcohol use No     Allergies   Penicillins; Statins; Sulfa drugs cross reactors; Tramadol hcl; Aspirin; Codeine; and Morphine and related   Review of Systems Review of Systems  Unable to perform ROS: Dementia     Physical Exam Updated Vital Signs BP (!) 147/70   Pulse 76   Temp 97.8 F (36.6 C) (Oral)   Resp 11   SpO2 (!) 86%   Physical Exam  Constitutional: She appears well-developed and well-nourished.  HENT:  Head: Normocephalic and atraumatic.  Right Ear: External ear normal.  Left Ear: External ear normal.  Nose: Nose  normal.  Eyes: Right eye exhibits no discharge. Left eye exhibits no discharge.  Cardiovascular: Normal rate, regular rhythm and normal heart sounds.   Pulses:      Dorsalis pedis pulses are 2+ on the right side, and 2+ on the left side.  Pulmonary/Chest: Effort normal and breath sounds normal.  Abdominal: Soft. There is no tenderness.  Musculoskeletal:       Left hip: She exhibits decreased range of motion and tenderness.  Left leg shortened and with mild external rotation  Neurological: She is alert. She is disoriented.  Awake, alert but confused. Does not follow commands  Skin: Skin is warm and dry.  Nursing note and vitals reviewed.  ED Treatments / Results  Labs (all labs ordered are listed, but only abnormal results are displayed) Labs Reviewed  BASIC METABOLIC PANEL - Abnormal; Notable for the following:       Result Value   Potassium 3.4 (*)    Glucose, Bld 102 (*)    Calcium 8.8 (*)    All other components within normal limits  CBC WITH DIFFERENTIAL/PLATELET  PROTIME-INR  APTT  URINALYSIS, ROUTINE W REFLEX MICROSCOPIC  TYPE AND SCREEN  TYPE AND SCREEN  ABO/RH    EKG  EKG Interpretation  Date/Time:  Thursday March 31 2017 12:54:20 EDT Ventricular Rate:  71 PR Interval:    QRS Duration: 106 QT Interval:  395 QTC Calculation: 430 R Axis:   91 Text Interpretation:  Sinus rhythm Right axis deviation Baseline wander in lead(s) V2 V5 No significant change since June 2018 Confirmed by Sherwood Gambler [phone]) on 03/31/2017 1:01:33 PM Also confirmed by Sherwood Gambler [phone]), editor Drema Pry [phone])  on 03/31/2017 1:18:39 PM       Radiology Xr Hip Unilat W Or W/o Pelvis 2-3 Views Left  Result Date: 03/31/2017 2 view radiographs of the left hip shows a displaced impacted femoral neck fracture with displacement.  Xr Hip Unilat W Or W/o Pelvis 2-3 Views Right  Result Date: 03/31/2017 2 view radiographs of the right hip shows stable internal  fixation of the intertrochanteric hip fracture.  No complicating features no hardware failure.   Procedures Procedures (including critical care time)  Medications Ordered in ED Medications  fentaNYL (SUBLIMAZE) injection 50 mcg (50 mcg Intravenous Given 03/31/17 1302)  potassium chloride SA (K-DUR,KLOR-CON) CR tablet 40 mEq (not administered)  0.9 %  sodium chloride infusion (not administered)  senna-docusate (Senokot-S) tablet 1 tablet (not administered)  bisacodyl (DULCOLAX) EC tablet 5 mg (not administered)  sodium phosphate (FLEET) 7-19 GM/118ML enema 1 enema (not administered)     Initial Impression / Assessment and Plan / ED Course  I have reviewed the triage vital signs and the nursing notes.  Pertinent labs & imaging results that were available during my care of the patient were reviewed by me and considered in my medical decision making (see chart for details).     Patient's exam is limited due to the dementia but she appears to only have the left-sided hip fracture.  Orthopedics is already aware patient and has scheduled her for the operating room tomorrow.  Hospitalist to admit.  No signs of an acute emergent condition besides the fracture.  Final Clinical Impressions(s) / ED Diagnoses   Final diagnoses:  Left displaced femoral neck fracture (HCC)    New Prescriptions New Prescriptions   No medications on file     Sherwood Gambler, MD 03/31/17 1457

## 2017-03-31 NOTE — ED Notes (Signed)
Pt is resting, yells out and sings at times.  Pt's family left for the night.

## 2017-03-31 NOTE — ED Notes (Signed)
Called [name] at [facility] when pt resides.  Spoke to [place].  She reports pt has had multiple falls.  Recent fall was x 2 weeks ago.  Noticed pt was in pain in her L hip on the 11th, had an xray done and was found to have a L hip fx.  Motrin was added for pain along with her tylenol.  [place] reports pt was not given a stronger pain med d/t multiple allergies to pain meds.

## 2017-03-31 NOTE — H&P (Signed)
History and Physical    Kimberly Acevedo CHE:[id number] DOB: 08-14-1928 DOA: 03/31/2017   PCP: Deland Pretty, MD   Patient coming from:  Home    Chief Complaint: Left  hip pain   HPI: Kimberly Acevedo is a 83 y.o. female with medical history significant for dementia, nursing home resident, GERD, fibromyalgia, osteoarthritis, history of breast cancer, multiple falls, and a history of recent hip fracture on 10/31/16 status post internal fixation for the right intertrochanteric hip fracture, just finishing rehabilitation, noted to have significant pain this morning. She was given Tylenol and Motrin, as she is allergic to several pain medications, including with anaphylaxis. No apparent head injury. X-rays showed an impacted right femoral neck fracture with displacement, seen at Dr. Jess Barters office, who recommended admission for OR tomorrow.  Other  information could not be obtained, as the patient is  Level  5 caveat, due to severe dementia. .    ED Course:  BP (!) 147/70   Pulse 76   Temp 97.8 F (36.6 C) (Oral)   Resp 11   SpO2 (!) 86%   Right hip XR stable , Left hip shows a  Displaced impacted fem neck fracture with displacement  Chest x-ray NAD  Sodium  138, potassium 3.4, glucose 102, creatinine 0.62, annual 10,  white count 10.1, hemoglobin 15, platelets 330, Limited amount of pain medication is given, this is the patient's serious side effects to narcotics, which include anaphylaxis. Review of Systems:  As per HPI otherwise all other systems reviewed and are negative  Past Medical History:  Diagnosis Date  . Anal fissure   . Arthritis   . Bilateral edema of lower extremity   . Cognitive dysfunction    DUE TO DEMENTIA--  PER NEUROLOGIST NOTE SEVERE  . Dementia with behavioral disturbance    PER NEUROLOGIST NOTE PT ABLE TO DO ADL'S BUT UNABLE TO CARE FOR HOME AND NEEDED AND PT HAS 24 HOUR CARE  . Dyslipidemia   . Fibromyalgia   . GERD (gastroesophageal reflux disease)   .  Hemorrhoids   . History of breast cancer oncologist-  dr Humphrey Rolls--- no recurrence   dx 05-19-2012  Stage I , Invasive Lobular/ LCIS & microinvasive ductal and DCIS (T1 A NX M0)--  s/p  left partial mastectomy 07-04-2012 and radiation  . History of diverticulitis of colon   . History of radiation therapy 08-10-2012 to 09-08-2012   Left breast 750 cGy in 3 sessions  . History of squamous cell carcinoma excision    NOSE  . Hypertension   . OSA on CPAP   . Prolapsed internal hemorrhoids, grade 3   . Varicose veins     Past Surgical History:  Procedure Laterality Date  . BREAST LUMPECTOMY WITH NEEDLE LOCALIZATION  07/04/2012   Procedure: BREAST LUMPECTOMY WITH NEEDLE LOCALIZATION;  Surgeon: Edward Jolly, MD;  Location: [place];  Service: General;  Laterality: Left;  left  . CHOLECYSTECTOMY  2007  . COLONOSCOPY    . ESOPHAGEAL DILATION    . HEMORRHOID BANDING  Mar & Apr 2016  . HEMORRHOID SURGERY N/A 12/25/2014   Procedure: HEMORRHOIDOPEXY WITH ANAL EXAM;  Surgeon: Leighton Ruff, MD;  Location: [facility];  Service: General;  Laterality: N/A;  . INTRAMEDULLARY (IM) NAIL INTERTROCHANTERIC Right 10/31/2016   Procedure: INTRAMEDULLARY (IM) NAIL INTERTROCHANTRIC;  Surgeon: [name], MD;  Location: [place];  Service: Orthopedics;  Laterality: Right;  . RECTOCELE REPAIR    . TOTAL ABDOMINAL  HYSTERECTOMY  1976  . VARICOSE VEIN SURGERY  2010   left lower extremity    Social History Social History   Social History  . Marital status: Widowed    Spouse name: N/A  . Number of children: 1  . Years of education: 58   Occupational History  . interior design   . Retired     Social History Main Topics  . Smoking status: Never Smoker  . Smokeless tobacco: Never Used  . Alcohol use No  . Drug use: No  . Sexual activity: Not on file   Other Topics Concern  . Not on file   Social History Narrative   Patient lives at home alone with 2 caregivers.     Patient is retired.    Patient has 1 child.    Patient has an 11th grade education.    Patient is right handed.      Allergies  Allergen Reactions  . Penicillins Swelling and Rash    Has patient had a PCN reaction causing immediate rash, facial/tongue/throat swelling, SOB or lightheadedness with hypotension: No Has patient had a PCN reaction causing severe rash involving mucus membranes or skin necrosis: No Has patient had a PCN reaction that required hospitalization No Has patient had a PCN reaction occurring within the last 10 years: No If all of the above answers are "NO", then may proceed with Cephalosporin use.  . Statins   . Sulfa Drugs Cross Reactors Shortness Of Breath  . Tramadol Hcl Anaphylaxis  . Aspirin Swelling  . Codeine Other (See Comments)    unknown  . Morphine And Related Other (See Comments)    Hypoglycemia    Family History  Problem Relation Age of Onset  . Cancer Mother 37       melanoma ca  . Arthritis Father   . Dementia Neg Hx       Prior to Admission medications   Medication Sig Start Date End Date Taking? Authorizing Provider  acetaminophen (TYLENOL) 500 MG tablet Take 1 tablet (500 mg total) by mouth every 6 (six) hours as needed for mild pain. 10/31/16   [name], MD  aspirin 325 MG tablet Take 1 tablet (325 mg total) by mouth daily. 11/02/16   Reyne Dumas, MD  bisacodyl (DULCOLAX) 5 MG EC tablet Take 1 tablet (5 mg total) by mouth daily as needed for moderate constipation. 11/02/16   Reyne Dumas, MD  Cholecalciferol (VITAMIN D) 2000 UNITS CAPS Take 1 capsule by mouth daily.    [provider]  losartan (COZAAR) 50 MG tablet Take 1 tablet (50 mg total) by mouth daily. 11/02/16   Reyne Dumas, MD  polyethylene glycol (MIRALAX / GLYCOLAX) packet Take 17 g by mouth daily as needed for mild constipation. 11/02/16   Reyne Dumas, MD  sertraline (ZOLOFT) 100 MG tablet Take 100 mg by mouth daily. 07/13/16   [provider]    vitamin B-12 (CYANOCOBALAMIN) 1000 MCG tablet Take 1,000 mcg by mouth daily.    [provider]    Physical Exam:  Vitals:   03/31/17 1237 03/31/17 1245 03/31/17 1330  BP: 115/68 114/64 (!) 147/70  Pulse: 88 72 76  Resp: (!) 22 20 11   Temp: 97.8 F (36.6 C)    TempSrc: Oral    SpO2: 99% 98% (!) 86%   Constitutional: NAD, uncomfortable due to the pain. Patient is unable to verbalize further. Eyes: PERRL, lids and conjunctivae normal ENMT: Mucous membranes are moist, without exudate or  lesions  Neck: normal, supple, no masses, no thyromegaly Respiratory: clear to auscultation bilaterally, no wheezing, no crackles. Normal respiratory effort  Cardiovascular: Regular rate and rhythm,  murmur, rubs or gallops. No extremity edema. 2+ pedal pulses. No carotid bruits.  Abdomen: Soft, non tender, No hepatosplenomegaly. Bowel sounds positive.  Musculoskeletal: no clubbing / cyanosis. Left hip immobilized, tender to palpation  Skin: no jaundice, No lesions.  Neurologic: Sensation intact  Strength equal in all extremities, except left  hip, which his immobilized. Psychiatric:   Alert, chronic dementia, she is a higher baseline.   Labs on Admission: I have personally reviewed following labs and imaging studies  CBC:  Recent Labs Lab 03/31/17 1257  WBC 10.1  NEUTROABS 7.0  HGB 15.0  HCT 44.8  MCV 95.1  PLT 638    Basic Metabolic Panel:  Recent Labs Lab 03/31/17 1257  NA 138  K 3.4*  CL 101  CO2 27  GLUCOSE 102*  BUN 15  CREATININE 0.62  CALCIUM 8.8*    GFR: CrCl cannot be calculated (Unknown ideal weight.).  Liver Function Tests: No results for input(s): AST, ALT, ALKPHOS, BILITOT, PROT, ALBUMIN in the last 168 hours. No results for input(s): LIPASE, AMYLASE in the last 168 hours. No results for input(s): AMMONIA in the last 168 hours.  Coagulation Profile:  Recent Labs Lab 03/31/17 1257  INR 1.03    Cardiac Enzymes: No results for input(s):  CKTOTAL, CKMB, CKMBINDEX, TROPONINI in the last 168 hours.  BNP (last 3 results) No results for input(s): PROBNP in the last 8760 hours.  HbA1C: No results for input(s): HGBA1C in the last 72 hours.  CBG: No results for input(s): GLUCAP in the last 168 hours.  Lipid Profile: No results for input(s): CHOL, HDL, LDLCALC, TRIG, CHOLHDL, LDLDIRECT in the last 72 hours.  Thyroid Function Tests: No results for input(s): TSH, T4TOTAL, FREET4, T3FREE, THYROIDAB in the last 72 hours.  Anemia Panel: No results for input(s): VITAMINB12, FOLATE, FERRITIN, TIBC, IRON, RETICCTPCT in the last 72 hours.  Urine analysis:    Component Value Date/Time   COLORURINE YELLOW 10/31/2016 0110   APPEARANCEUR CLEAR 10/31/2016 0110   LABSPEC 1.027 10/31/2016 0110   PHURINE 6.0 10/31/2016 0110   GLUCOSEU NEGATIVE 10/31/2016 0110   HGBUR NEGATIVE 10/31/2016 0110   BILIRUBINUR NEGATIVE 10/31/2016 0110   KETONESUR 20 (A) 10/31/2016 0110   PROTEINUR 30 (A) 10/31/2016 0110   UROBILINOGEN 1.0 01/11/2015 1030   NITRITE NEGATIVE 10/31/2016 0110   LEUKOCYTESUR NEGATIVE 10/31/2016 0110    Sepsis Labs: @LABRCNTIP (procalcitonin:4,lacticidven:4) )No results found for this or any previous visit (from the past 240 hour(s)).   Radiological Exams on Admission: Chest Portable 1 View  Result Date: 03/31/2017 CLINICAL DATA:  Preop chest exam for hip arthroplasty due to fracture. History of breast cancer. EXAM: PORTABLE CHEST 1 VIEW COMPARISON:  11/21/2016.  12/07/2016. FINDINGS: The heart size and mediastinal contours are within normal limits. Both lungs are clear. The visualized skeletal structures are unremarkable. Aortic atherosclerosis. IMPRESSION: No active disease.  Stable exam. Electronically Signed   By: Staci Righter M.D.   On: 03/31/2017 15:01   Xr Hip Unilat W Or W/o Pelvis 2-3 Views Left  Result Date: 03/31/2017 2 view radiographs of the left hip shows a displaced impacted femoral neck fracture with  displacement.  Xr Hip Unilat W Or W/o Pelvis 2-3 Views Right  Result Date: 03/31/2017 2 view radiographs of the right hip shows stable internal fixation of the intertrochanteric hip fracture.  No complicating features no hardware failure.   EKG: Independently reviewed.  Assessment/Plan Active Problems:   S/p left hip fracture   Hypertension, essential, benign   Fibromyalgia   Achalasia, esophageal   Invasive lobular carcinoma of breast, stage 1 (HCC)   Depression   Dementia with behavioral disturbance   GERD (gastroesophageal reflux disease)   Dementia   OSA on CPAP  Left  hip Fracture as evidenced by imaging. Patient s/p surgery of  R hip in May 2018, and since then d subsequent falls  History of osteoporosis  Received tylenol and Motrin  due to serious allergies to narcotics   CXR NAD. CBC and CMET unremarkable  Admit to med surg, anticipating surgery as per Ortho in am  NPO after midnight  SCDs IVF Pain control with tylenol  Hold ASA PT/OT consult Other plans as per Ortho  Continue Calcium daily   History of Severe Dementia, on  . No acute issues Continue Zoloft.    Hypertension BP  147/70   Pulse 76     Continue home anti-hypertensive medications   OSA Continue CPAP nightly   DVT prophylaxis:  SCD  Code Status:    DNR  Family Communication:  None  Disposition Plan: Expect patient to be discharged to home after condition improves Consults called:    Kizzie Ide  Admission status: Medsurg IP   Rondel Jumbo, PA-C [facility]   03/31/2017, 3:51 PM

## 2017-04-01 ENCOUNTER — Inpatient Hospital Stay (HOSPITAL_COMMUNITY): Payer: Medicare Other | Admitting: Anesthesiology

## 2017-04-01 ENCOUNTER — Encounter (HOSPITAL_COMMUNITY): Payer: Self-pay | Admitting: *Deleted

## 2017-04-01 ENCOUNTER — Encounter (HOSPITAL_COMMUNITY)
Admission: EM | Disposition: A | Discharge status: Home Health | Payer: Self-pay | Source: Home / Self Care | Attending: Family Medicine

## 2017-04-01 ENCOUNTER — Telehealth (INDEPENDENT_AMBULATORY_CARE_PROVIDER_SITE_OTHER): Payer: Self-pay | Admitting: Orthopedic Surgery

## 2017-04-01 DIAGNOSIS — S72002A Fracture of unspecified part of neck of left femur, initial encounter for closed fracture: Principal | ICD-10-CM

## 2017-04-01 DIAGNOSIS — S72002K Fracture of unspecified part of neck of left femur, subsequent encounter for closed fracture with nonunion: Secondary | ICD-10-CM

## 2017-04-01 HISTORY — PX: HIP ARTHROPLASTY: SHX981

## 2017-04-01 LAB — COMPREHENSIVE METABOLIC PANEL
ALBUMIN: 2.6 g/dL — AB (ref 3.5–5.0)
ALT: 11 U/L — ABNORMAL LOW (ref 14–54)
ANION GAP: 9 (ref 5–15)
AST: 20 U/L (ref 15–41)
Alkaline Phosphatase: 55 U/L (ref 38–126)
BILIRUBIN TOTAL: 0.6 mg/dL (ref 0.3–1.2)
BUN: 10 mg/dL (ref 6–20)
CO2: 27 mmol/L (ref 22–32)
Calcium: 8.5 mg/dL — ABNORMAL LOW (ref 8.9–10.3)
Chloride: 103 mmol/L (ref 101–111)
Creatinine, Ser: 0.54 mg/dL (ref 0.44–1.00)
GFR calc Af Amer: >60 mL/min (ref >60)
GLUCOSE: 90 mg/dL (ref 65–99)
Potassium: 3.4 mmol/L — ABNORMAL LOW (ref 3.5–5.1)
Sodium: 139 mmol/L (ref 135–145)
TOTAL PROTEIN: 5.9 g/dL — AB (ref 6.5–8.1)

## 2017-04-01 LAB — MRSA PCR SCREENING: MRSA BY PCR: NEGATIVE

## 2017-04-01 LAB — CBC
HEMATOCRIT: 39.8 % (ref 36.0–46.0)
HEMOGLOBIN: 12.6 g/dL (ref 12.0–15.0)
MCH: 30.1 pg (ref 26.0–34.0)
MCHC: 31.7 g/dL (ref 30.0–36.0)
MCV: 95.2 fL (ref 78.0–100.0)
Platelets: 280 10*3/uL (ref 150–400)
RBC: 4.18 MIL/uL (ref 3.87–5.11)
RDW: 13.5 % (ref 11.5–15.5)
WBC: 8.3 10*3/uL (ref 4.0–10.5)

## 2017-04-01 LAB — PROTIME-INR
INR: 1.1
Prothrombin Time: 14.1 seconds (ref 11.4–15.2)

## 2017-04-01 LAB — URINALYSIS, ROUTINE W REFLEX MICROSCOPIC
Bilirubin Urine: NEGATIVE
Glucose, UA: NEGATIVE mg/dL
HGB URINE DIPSTICK: NEGATIVE
Ketones, ur: 5 mg/dL — AB
LEUKOCYTES UA: NEGATIVE
NITRITE: NEGATIVE
PROTEIN: NEGATIVE mg/dL
Specific Gravity, Urine: 1.023 (ref 1.005–1.030)
pH: 6 (ref 5.0–8.0)

## 2017-04-01 SURGERY — HEMIARTHROPLASTY, HIP, DIRECT ANTERIOR APPROACH, FOR FRACTURE
Anesthesia: General | Site: Hip | Laterality: Left

## 2017-04-01 MED ORDER — PROPOFOL 10 MG/ML IV BOLUS
INTRAVENOUS | Status: DC | PRN
  Administered 2017-04-01: 60 mg via INTRAVENOUS
  Administered 2017-04-01: 120 mg via INTRAVENOUS

## 2017-04-01 MED ORDER — LIDOCAINE 2% (20 MG/ML) 5 ML SYRINGE
INTRAMUSCULAR | Status: AC
  Filled 2017-04-01: qty 5

## 2017-04-01 MED ORDER — ONDANSETRON HCL 4 MG/2ML IJ SOLN: 4.0000 mg | Freq: Four times a day (QID) | INTRAMUSCULAR | Status: DC | PRN

## 2017-04-01 MED ORDER — WARFARIN - PHARMACIST DOSING INPATIENT
Freq: Every day | Status: DC
  Administered 2017-04-01: 17:00:00

## 2017-04-01 MED ORDER — ACETAMINOPHEN 500 MG PO TABS: 500.0000 mg | ORAL_TABLET | Freq: Four times a day (QID) | ORAL | 0 refills | Status: AC | PRN

## 2017-04-01 MED ORDER — ASPIRIN EC 325 MG PO TBEC: 325.0000 mg | DELAYED_RELEASE_TABLET | Freq: Every day | ORAL | Status: DC

## 2017-04-01 MED ORDER — METOCLOPRAMIDE HCL 5 MG/ML IJ SOLN: 5.0000 mg | Freq: Three times a day (TID) | INTRAMUSCULAR | Status: DC | PRN

## 2017-04-01 MED ORDER — ONDANSETRON HCL 4 MG/2ML IJ SOLN
INTRAMUSCULAR | Status: AC
  Filled 2017-04-01: qty 2

## 2017-04-01 MED ORDER — DIVALPROEX SODIUM 125 MG PO CSDR
375.0000 mg | DELAYED_RELEASE_CAPSULE | Freq: Two times a day (BID) | ORAL | Status: DC
  Administered 2017-04-01 – 2017-04-04 (×6): 375 mg via ORAL
  Filled 2017-04-01 (×8): qty 3

## 2017-04-01 MED ORDER — ASPIRIN EC 81 MG PO TBEC: 81.0000 mg | DELAYED_RELEASE_TABLET | Freq: Every day | ORAL | 1 refills | Status: AC

## 2017-04-01 MED ORDER — 0.9 % SODIUM CHLORIDE (POUR BTL) OPTIME
TOPICAL | Status: DC | PRN
Start: 2017-04-01 — End: 2017-04-01
  Administered 2017-04-01: 1000 mL

## 2017-04-01 MED ORDER — WARFARIN SODIUM 5 MG PO TABS
2.5000 mg | ORAL_TABLET | Freq: Once | ORAL | Status: AC
  Administered 2017-04-01: 2.5 mg via ORAL
  Filled 2017-04-01: qty 1

## 2017-04-01 MED ORDER — CLINDAMYCIN PHOSPHATE 600 MG/50ML IV SOLN
600.0000 mg | Freq: Four times a day (QID) | INTRAVENOUS | Status: AC
  Administered 2017-04-01 – 2017-04-02 (×2): 600 mg via INTRAVENOUS
  Filled 2017-04-01 (×2): qty 50

## 2017-04-01 MED ORDER — METOCLOPRAMIDE HCL 5 MG PO TABS: 5.0000 mg | ORAL_TABLET | Freq: Three times a day (TID) | ORAL | Status: DC | PRN

## 2017-04-01 MED ORDER — ROCURONIUM BROMIDE 100 MG/10ML IV SOLN
INTRAVENOUS | Status: DC | PRN
  Administered 2017-04-01: 40 mg via INTRAVENOUS

## 2017-04-01 MED ORDER — ONDANSETRON HCL 4 MG/2ML IJ SOLN
INTRAMUSCULAR | Status: DC | PRN
  Administered 2017-04-01: 4 mg via INTRAVENOUS

## 2017-04-01 MED ORDER — MENTHOL 3 MG MT LOZG: 1.0000 | LOZENGE | OROMUCOSAL | Status: DC | PRN

## 2017-04-01 MED ORDER — FENTANYL CITRATE (PF) 100 MCG/2ML IJ SOLN
INTRAMUSCULAR | Status: AC
  Administered 2017-04-01: 50 ug via INTRAVENOUS
  Filled 2017-04-01: qty 2

## 2017-04-01 MED ORDER — ONDANSETRON HCL 4 MG PO TABS
4.0000 mg | ORAL_TABLET | Freq: Four times a day (QID) | ORAL | Status: DC | PRN
Start: 2017-04-01 — End: 2017-04-01

## 2017-04-01 MED ORDER — LORAZEPAM 0.5 MG PO TABS: 0.5000 mg | ORAL_TABLET | Freq: Three times a day (TID) | ORAL | Status: DC | PRN

## 2017-04-01 MED ORDER — SUGAMMADEX SODIUM 200 MG/2ML IV SOLN
INTRAVENOUS | Status: DC | PRN
  Administered 2017-04-01: 150 mg via INTRAVENOUS

## 2017-04-01 MED ORDER — POLYETHYLENE GLYCOL 3350 17 G PO PACK: 17.0000 g | PACK | Freq: Every day | ORAL | Status: DC | PRN

## 2017-04-01 MED ORDER — PHENYLEPHRINE HCL 10 MG/ML IJ SOLN
INTRAVENOUS | Status: DC | PRN
  Administered 2017-04-01: 50 ug/min via INTRAVENOUS

## 2017-04-01 MED ORDER — CLINDAMYCIN PHOSPHATE 900 MG/50ML IV SOLN
INTRAVENOUS | Status: AC
  Filled 2017-04-01: qty 50

## 2017-04-01 MED ORDER — FENTANYL CITRATE (PF) 100 MCG/2ML IJ SOLN
25.0000 ug | INTRAMUSCULAR | Status: DC | PRN
Start: 2017-04-01 — End: 2017-04-01
  Administered 2017-04-01 (×2): 50 ug via INTRAVENOUS

## 2017-04-01 MED ORDER — HYDROCODONE-ACETAMINOPHEN 5-325 MG PO TABS
1.0000 | ORAL_TABLET | Freq: Four times a day (QID) | ORAL | Status: DC | PRN
  Administered 2017-04-01: 2 via ORAL
  Administered 2017-04-02: 1 via ORAL
  Administered 2017-04-02 – 2017-04-04 (×4): 2 via ORAL
  Filled 2017-04-01: qty 2
  Filled 2017-04-01: qty 1
  Filled 2017-04-01 (×4): qty 2

## 2017-04-01 MED ORDER — DEXAMETHASONE SODIUM PHOSPHATE 10 MG/ML IJ SOLN
INTRAMUSCULAR | Status: DC | PRN
  Administered 2017-04-01: 10 mg via INTRAVENOUS

## 2017-04-01 MED ORDER — HYDROCORTISONE 2.5 % RE CREA
1.0000 "application " | TOPICAL_CREAM | Freq: Three times a day (TID) | RECTAL | Status: DC | PRN
  Filled 2017-04-01: qty 28.35

## 2017-04-01 MED ORDER — ACETAMINOPHEN 650 MG RE SUPP: 650.0000 mg | Freq: Four times a day (QID) | RECTAL | Status: DC | PRN

## 2017-04-01 MED ORDER — PHENOL 1.4 % MT LIQD: 1.0000 | OROMUCOSAL | Status: DC | PRN

## 2017-04-01 MED ORDER — MORPHINE SULFATE (PF) 4 MG/ML IV SOLN
0.5000 mg | INTRAVENOUS | Status: DC | PRN
  Administered 2017-04-02 – 2017-04-03 (×2): 0.52 mg via INTRAVENOUS
  Filled 2017-04-01 (×2): qty 1

## 2017-04-01 MED ORDER — FENTANYL CITRATE (PF) 100 MCG/2ML IJ SOLN
INTRAMUSCULAR | Status: DC | PRN
  Administered 2017-04-01 (×2): 50 ug via INTRAVENOUS

## 2017-04-01 MED ORDER — SODIUM CHLORIDE 0.9 % IR SOLN
Status: DC | PRN
  Administered 2017-04-01: 3000 mL

## 2017-04-01 MED ORDER — DEXAMETHASONE SODIUM PHOSPHATE 10 MG/ML IJ SOLN
INTRAMUSCULAR | Status: AC
  Filled 2017-04-01: qty 1

## 2017-04-01 MED ORDER — MORPHINE SULFATE (PF) 4 MG/ML IV SOLN: 0.5000 mg | INTRAVENOUS | Status: DC | PRN

## 2017-04-01 MED ORDER — MORPHINE SULFATE 2 MG/ML IJ SOLN: 0.5000 mg | INTRAMUSCULAR | Status: DC | PRN

## 2017-04-01 MED ORDER — ACETAMINOPHEN 325 MG PO TABS: 650.0000 mg | ORAL_TABLET | Freq: Four times a day (QID) | ORAL | Status: DC | PRN

## 2017-04-01 MED ORDER — FENTANYL CITRATE (PF) 250 MCG/5ML IJ SOLN
INTRAMUSCULAR | Status: AC
  Filled 2017-04-01: qty 5

## 2017-04-01 MED ORDER — LACTATED RINGERS IV SOLN
INTRAVENOUS | Status: DC
  Administered 2017-04-01 (×2): via INTRAVENOUS

## 2017-04-01 MED ORDER — LIDOCAINE HCL (CARDIAC) 20 MG/ML IV SOLN
INTRAVENOUS | Status: DC | PRN
  Administered 2017-04-01: 50 mg via INTRAVENOUS

## 2017-04-01 SURGICAL SUPPLY — 46 items
BLADE SAW SAG 73X25 THK (BLADE) ×1
BLADE SAW SGTL 73X25 THK (BLADE) ×1 IMPLANT
BRUSH FEMORAL CANAL (MISCELLANEOUS) IMPLANT
CAPT HIP HEMI 1 ×2 IMPLANT
COVER SURGICAL LIGHT HANDLE (MISCELLANEOUS) ×2 IMPLANT
DRAPE IMP U-DRAPE 54X76 (DRAPES) ×2 IMPLANT
DRAPE INCISE IOBAN 85X60 (DRAPES) ×4 IMPLANT
DRAPE ORTHO SPLIT 77X108 STRL (DRAPES) ×2
DRAPE SURG ORHT 6 SPLT 77X108 (DRAPES) ×2 IMPLANT
DRAPE U-SHAPE 47X51 STRL (DRAPES) ×2 IMPLANT
DRSG AQUACEL AG ADV 3.5X10 (GAUZE/BANDAGES/DRESSINGS) ×2 IMPLANT
DRSG MEPILEX BORDER 4X8 (GAUZE/BANDAGES/DRESSINGS) ×4 IMPLANT
DURAPREP 26ML APPLICATOR (WOUND CARE) ×2 IMPLANT
ELECT BLADE 6.5 EXT (BLADE) IMPLANT
ELECT CAUTERY BLADE 6.4 (BLADE) IMPLANT
ELECT REM PT RETURN 9FT ADLT (ELECTROSURGICAL) ×2
ELECTRODE REM PT RTRN 9FT ADLT (ELECTROSURGICAL) ×1 IMPLANT
GAUZE SPONGE 4X4 12PLY STRL (GAUZE/BANDAGES/DRESSINGS) ×2 IMPLANT
GLOVE BIOGEL PI IND STRL 7.0 (GLOVE) ×1 IMPLANT
GLOVE BIOGEL PI IND STRL 9 (GLOVE) ×1 IMPLANT
GLOVE BIOGEL PI INDICATOR 7.0 (GLOVE) ×1
GLOVE BIOGEL PI INDICATOR 9 (GLOVE) ×1
GLOVE SURG ORTHO 9.0 STRL STRW (GLOVE) ×2 IMPLANT
GLOVE SURG SS PI 7.0 STRL IVOR (GLOVE) ×2 IMPLANT
GOWN STRL REUS W/ TWL XL LVL3 (GOWN DISPOSABLE) ×1 IMPLANT
GOWN STRL REUS W/TWL XL LVL3 (GOWN DISPOSABLE) ×1
HANDPIECE INTERPULSE COAX TIP (DISPOSABLE)
IMMOBILIZER KNEE 22 UNIV (SOFTGOODS) ×2 IMPLANT
KIT BASIN OR (CUSTOM PROCEDURE TRAY) ×2 IMPLANT
KIT ROOM TURNOVER OR (KITS) ×2 IMPLANT
MANIFOLD NEPTUNE II (INSTRUMENTS) ×2 IMPLANT
NS IRRIG 1000ML POUR BTL (IV SOLUTION) ×2 IMPLANT
PACK TOTAL JOINT (CUSTOM PROCEDURE TRAY) ×2 IMPLANT
PACK UNIVERSAL I (CUSTOM PROCEDURE TRAY) ×2 IMPLANT
PAD ARMBOARD 7.5X6 YLW CONV (MISCELLANEOUS) ×4 IMPLANT
SET HNDPC FAN SPRY TIP SCT (DISPOSABLE) IMPLANT
STAPLER VISISTAT 35W (STAPLE) ×2 IMPLANT
SUT ETHIBOND NAB CT1 #1 30IN (SUTURE) ×2 IMPLANT
SUT VIC AB 1 CT1 27 (SUTURE) ×1
SUT VIC AB 1 CT1 27XBRD ANBCTR (SUTURE) ×1 IMPLANT
SUT VIC AB 2-0 CTB1 (SUTURE) ×2 IMPLANT
TOWEL OR 17X24 6PK STRL BLUE (TOWEL DISPOSABLE) ×2 IMPLANT
TOWEL OR 17X26 10 PK STRL BLUE (TOWEL DISPOSABLE) ×2 IMPLANT
TOWER CARTRIDGE SMART MIX (DISPOSABLE) IMPLANT
TRAY FOLEY W/METER SILVER 16FR (SET/KITS/TRAYS/PACK) IMPLANT
WATER STERILE IRR 1000ML POUR (IV SOLUTION) ×6 IMPLANT

## 2017-04-01 NOTE — Progress Notes (Signed)
Telephone call to Daughter Donia Guiles [phone] left message [phone]

## 2017-04-01 NOTE — H&P (View-Only) (Signed)
Office Visit Note   Patient: Kimberly Acevedo           Date of Birth: [birth year]           MRN: [id number] Visit Date: 03/31/2017              Requested by: Jacklyn Shell, [place] [place], [place] [address] PCP: Deland Pretty, MD  Chief Complaint  Patient presents with  . Right Hip - Pain      HPI: Patient is an 83 year old woman with multiple medical problems including Alzheimer's dementia who is been having subacute left hip pain.  She is status post internal fixation for a right intertrochanteric hip fracture and has been having left hip pain.  She calls out in pain.  No known injury from the skilled nursing facility no known falls she has been trying to work with physical therapy but is unable to ambulate even with assistance.  Assessment & Plan: Visit Diagnoses:  1. Pain in right hip   2. Pain in left hip   3. Closed displaced fracture of left femoral neck (HCC)     Plan: Discussed with the patient's daughter who is the power of attorney options for the left hip options include left hip hemiarthroplasty versus observation and pain medicine for pain management.  Discussed the increased risks of infection neurovascular injury DVT dislocation need for additional surgery for the left hip.  Discussed the patient would not be able to ambulate due to her dementia.  Patient's daughter states she understands and would like to proceed with left hip hemiarthroplasty.  We will have the patient admitted through the hospital today of [facility] to the hospitalist service and plan for left hip hemiarthroplasty tomorrow Friday.  Follow-Up Instructions: Return in about 2 weeks (around 04/14/2017).   Ortho Exam  Patient is not oriented, normal respiratory effort. On examination patient has dementia she is not alert not oriented.  She has pain with any attempted range of motion of the left hip there is no pain with range of motion of the right hip.  There is no skin  breakdown.  Imaging: Xr Hip Unilat W Or W/o Pelvis 2-3 Views Left  Result Date: 03/31/2017 2 view radiographs of the left hip shows a displaced impacted femoral neck fracture with displacement.  Xr Hip Unilat W Or W/o Pelvis 2-3 Views Right  Result Date: 03/31/2017 2 view radiographs of the right hip shows stable internal fixation of the intertrochanteric hip fracture.  No complicating features no hardware failure.  No images are attached to the encounter.  Labs: Lab Results  Component Value Date   HGBA1C 6.3 (H) 10/30/2016   HGBA1C 6.5 07/20/2016   REPTSTATUS 01/15/2015 FINAL 01/11/2015   CULT  01/11/2015    >=100,000 COLONIES/mL PROTEUS MIRABILIS Performed at [facility] 01/11/2015    Orders:  Orders Placed This Encounter  Procedures  . XR HIP UNILAT W OR W/O PELVIS 2-3 VIEWS RIGHT  . XR HIP UNILAT W OR W/O PELVIS 2-3 VIEWS LEFT   No orders of the defined types were placed in this encounter.    Procedures: No procedures performed  Clinical Data: No additional findings.  ROS:  All other systems negative, except as noted in the HPI. Review of Systems  Objective: Vital Signs: There were no vitals taken for this visit.  Specialty Comments:  No specialty comments available.  PMFS History: Patient Active Problem List   Diagnosis Date  Noted  . Pressure injury of skin 11/02/2016  . Hyperglycemia 10/31/2016  . Closed displaced fracture of left femoral neck ([place]) 10/30/2016  . OSA on CPAP 10/30/2016  . Volume depletion   . GI bleed 11/20/2015  . Gastrointestinal hemorrhage associated with intestinal diverticulitis   . Diverticulitis of colon   . Intra-abdominal hematoma 01/06/2015  . Normocytic normochromic anemia 01/06/2015  . Nausea vomiting and diarrhea 01/06/2015  . Bleeding internal hemorrhoids 07/16/2014  . Anal fissure - posterior 06/17/2014  . Dementia 06/15/2014  . Generalized abdominal pain   . Abdominal  pain   . Pancreatitis 05/24/2014  . Leukocytosis 05/24/2014  . GERD (gastroesophageal reflux disease) 05/24/2014  . Dementia with behavioral disturbance 02/27/2014  . Rectal bleeding 05/21/2013  . Hypokalemia 05/21/2013  . Depression 05/21/2013  . Invasive lobular carcinoma of breast, stage 1 ([place]) 06/23/2012  . Hypertension, essential, benign   . Fibromyalgia   . Achalasia, esophageal    Past Medical History:  Diagnosis Date  . Anal fissure   . Arthritis   . Bilateral edema of lower extremity   . Cognitive dysfunction    DUE TO DEMENTIA--  PER NEUROLOGIST NOTE SEVERE  . Dementia with behavioral disturbance    PER NEUROLOGIST NOTE PT ABLE TO DO ADL'S BUT UNABLE TO CARE FOR HOME AND NEEDED AND PT HAS 24 HOUR CARE  . Dyslipidemia   . Fibromyalgia   . GERD (gastroesophageal reflux disease)   . Hemorrhoids   . History of breast cancer oncologist-  dr Humphrey Rolls--- no recurrence   dx 05-19-2012  Stage I , Invasive Lobular/ LCIS & microinvasive ductal and DCIS (T1 A NX M0)--  s/p  left partial mastectomy 07-04-2012 and radiation  . History of diverticulitis of colon   . History of radiation therapy 08-10-2012 to 09-08-2012   Left breast 750 cGy in 3 sessions  . History of squamous cell carcinoma excision    NOSE  . Hypertension   . OSA on CPAP   . Prolapsed internal hemorrhoids, grade 3   . Varicose veins     Family History  Problem Relation Age of Onset  . Cancer Mother 32       melanoma ca  . Arthritis Father   . Dementia Neg Hx     Past Surgical History:  Procedure Laterality Date  . BREAST LUMPECTOMY WITH NEEDLE LOCALIZATION  07/04/2012   Procedure: BREAST LUMPECTOMY WITH NEEDLE LOCALIZATION;  Surgeon: Edward Jolly, MD;  Location: [facility];  Service: General;  Laterality: Left;  left  . CHOLECYSTECTOMY  2007  . COLONOSCOPY    . ESOPHAGEAL DILATION    . HEMORRHOID BANDING  Mar & Apr 2016  . HEMORRHOID SURGERY N/A 12/25/2014   Procedure:  HEMORRHOIDOPEXY WITH ANAL EXAM;  Surgeon: Leighton Ruff, MD;  Location: [facility];  Service: General;  Laterality: N/A;  . INTRAMEDULLARY (IM) NAIL INTERTROCHANTERIC Right 10/31/2016   Procedure: INTRAMEDULLARY (IM) NAIL INTERTROCHANTRIC;  Surgeon: [name], MD;  Location: [facility];  Service: Orthopedics;  Laterality: Right;  . RECTOCELE REPAIR    . TOTAL ABDOMINAL HYSTERECTOMY  1976  . VARICOSE VEIN SURGERY  2010   left lower extremity   Social History   Occupational History  . interior design   . Retired     Social History Main Topics  . Smoking status: Never Smoker  . Smokeless tobacco: Never Used  . Alcohol use No  . Drug use: No  . Sexual activity: Not on  file

## 2017-04-01 NOTE — Anesthesia Procedure Notes (Signed)
Procedure Name: Intubation Date/Time: 04/01/2017 12:05 PM Performed by: Lieutenant Diego Pre-anesthesia Checklist: Patient identified, Emergency Drugs available, Suction available and Patient being monitored Patient Re-evaluated:Patient Re-evaluated prior to induction Oxygen Delivery Method: Circle system utilized Preoxygenation: Pre-oxygenation with 100% oxygen Induction Type: IV induction Ventilation: Mask ventilation without difficulty Laryngoscope Size: Miller, 2, Mac and 3 Grade View: Grade III Tube type: Oral Tube size: 7.0 mm Number of attempts: 1 Airway Equipment and Method: Stylet and Oral airway Placement Confirmation: ETT inserted through vocal cords under direct vision,  positive ETCO2 and breath sounds checked- equal and bilateral Secured at: 24 cm Tube secured with: Tape Dental Injury: Teeth and Oropharynx as per pre-operative assessment  Comments: DL with Miller 2, unable to view cords. MAC 3 used to place bougie, ett slides easy over, +EtCO2, =BBS

## 2017-04-01 NOTE — Progress Notes (Signed)
Orthopedic Tech Progress Note Patient Details:  Kimberly Acevedo 04/09/1929 [id number]  Patient ID: Kimberly Acevedo, female   DOB: 04/15/1929, 83 y.o.   MRN: [id number]   Kimberly Acevedo 04/01/2017, 3:44 PMUnable to use Trapeze bar.

## 2017-04-01 NOTE — Consult Note (Signed)
ORTHOPAEDIC CONSULTATION  REQUESTING PHYSICIAN: Nita Sells, MD  Chief Complaint: Left hip pain  HPI: Kimberly Acevedo is a 83 y.o. female who presents with who presented with left hip pain of unknown etiology.  Radiographs shows a displaced impacted femoral neck fracture.  Past Medical History:  Diagnosis Date  . Anal fissure   . Arthritis   . Bilateral edema of lower extremity   . Cognitive dysfunction    DUE TO DEMENTIA--  PER NEUROLOGIST NOTE SEVERE  . Dementia with behavioral disturbance    PER NEUROLOGIST NOTE PT ABLE TO DO ADL'S BUT UNABLE TO CARE FOR HOME AND NEEDED AND PT HAS 24 HOUR CARE  . Dyslipidemia   . Fibromyalgia   . GERD (gastroesophageal reflux disease)   . Hemorrhoids   . History of breast cancer oncologist-  dr Humphrey Rolls--- no recurrence   dx 05-19-2012  Stage I , Invasive Lobular/ LCIS & microinvasive ductal and DCIS (T1 A NX M0)--  s/p  left partial mastectomy 07-04-2012 and radiation  . History of diverticulitis of colon   . History of radiation therapy 08-10-2012 to 09-08-2012   Left breast 750 cGy in 3 sessions  . History of squamous cell carcinoma excision    NOSE  . Hypertension   . OSA on CPAP   . Prolapsed internal hemorrhoids, grade 3   . Varicose veins    Past Surgical History:  Procedure Laterality Date  . BREAST LUMPECTOMY WITH NEEDLE LOCALIZATION  07/04/2012   Procedure: BREAST LUMPECTOMY WITH NEEDLE LOCALIZATION;  Surgeon: Edward Jolly, MD;  Location: [facility];  Service: General;  Laterality: Left;  left  . CHOLECYSTECTOMY  2007  . COLONOSCOPY    . ESOPHAGEAL DILATION    . HEMORRHOID BANDING  Mar & Apr 2016  . HEMORRHOID SURGERY N/A 12/25/2014   Procedure: HEMORRHOIDOPEXY WITH ANAL EXAM;  Surgeon: Leighton Ruff, MD;  Location: [facility];  Service: General;  Laterality: N/A;  . INTRAMEDULLARY (IM) NAIL INTERTROCHANTERIC Right 10/31/2016   Procedure: INTRAMEDULLARY (IM) NAIL INTERTROCHANTRIC;   Surgeon: [name], MD;  Location: [facility];  Service: Orthopedics;  Laterality: Right;  . RECTOCELE REPAIR    . TOTAL ABDOMINAL HYSTERECTOMY  1976  . VARICOSE VEIN SURGERY  2010   left lower extremity   Social History   Social History  . Marital status: Widowed    Spouse name: N/A  . Number of children: 1  . Years of education: 70   Occupational History  . interior design   . Retired     Social History Main Topics  . Smoking status: Never Smoker  . Smokeless tobacco: Never Used  . Alcohol use No  . Drug use: No  . Sexual activity: Not Asked   Other Topics Concern  . None   Social History Narrative   Patient lives at home alone with 2 caregivers.    Patient is retired.    Patient has 1 child.    Patient has an 11th grade education.    Patient is right handed.    Family History  Problem Relation Age of Onset  . Cancer Mother 76       melanoma ca  . Arthritis Father   . Dementia Neg Hx    - negative except otherwise stated in the family history section Allergies  Allergen Reactions  . Penicillins Swelling and Rash    Has patient had a PCN reaction causing immediate rash, facial/tongue/throat swelling, SOB or  lightheadedness with hypotension: No Has patient had a PCN reaction causing severe rash involving mucus membranes or skin necrosis: No Has patient had a PCN reaction that required hospitalization No Has patient had a PCN reaction occurring within the last 10 years: No If all of the above answers are "NO", then may proceed with Cephalosporin use.  . Statins   . Sulfa Drugs Cross Reactors Shortness Of Breath  . Tramadol Hcl Anaphylaxis  . Aspirin Swelling  . Codeine Other (See Comments)    unknown  . Morphine And Related Other (See Comments)    Hypoglycemia   Prior to Admission medications   Medication Sig Start Date End Date Taking? Authorizing Provider  acetaminophen (TYLENOL) 500 MG tablet Take 1 tablet (500 mg total) by mouth every 6 (six) hours as  needed for mild pain. 10/31/16  Yes [name], MD  bisacodyl (DULCOLAX) 5 MG EC tablet Take 1 tablet (5 mg total) by mouth daily as needed for moderate constipation. 11/02/16  Yes Reyne Dumas, MD  Cholecalciferol (VITAMIN D) 2000 UNITS CAPS Take 2,000 Units by mouth daily.    Yes [provider]  divalproex (DEPAKOTE SPRINKLE) 125 MG capsule Take 375 mg by mouth 2 (two) times daily.   Yes [provider]  hydrocortisone (ANUSOL-HC) 2.5 % rectal cream Place 1 application rectally 3 (three) times daily as needed for hemorrhoids or anal itching.   Yes [provider]  hydrocortisone 2.5 % cream Apply 1 application topically 3 (three) times daily as needed. Apply a thin layer to the affected area (s) by topical route   Yes [provider]  ibuprofen (ADVIL,MOTRIN) 600 MG tablet Take 600 mg by mouth 3 (three) times daily. For 14 days  STARTED: March 29, 2017   Yes [provider]  Infant Care Products [facility]) Apply 1 application topically 3 (three) times daily. Ointment Apply Topically to rash with brief changes and incontience episodes   Yes [provider]  loperamide (IMODIUM A-D) 2 MG tablet Take 2 mg by mouth as needed for diarrhea or loose stools.   Yes [provider]  LORazepam (ATIVAN) 0.5 MG tablet Take 0.5 mg by mouth 3 (three) times daily as needed for anxiety.   Yes [provider]  losartan (COZAAR) 50 MG tablet Take 1 tablet (50 mg total) by mouth daily. 11/02/16  Yes Reyne Dumas, MD  polyethylene glycol (MIRALAX / GLYCOLAX) packet Take 17 g by mouth daily as needed for mild constipation. 11/02/16  Yes Reyne Dumas, MD  sertraline (ZOLOFT) 50 MG tablet Take 50 mg by mouth daily.  07/13/16  Yes [provider]  vitamin B-12 (CYANOCOBALAMIN) 1000 MCG tablet Take 1,000 mcg by mouth every other day.    Yes [provider]  acetaminophen (TYLENOL) 500 MG tablet Take 1 tablet (500 mg total) by  mouth every 6 (six) hours as needed for mild pain. 04/01/17   [name], MD  aspirin 325 MG tablet Take 1 tablet (325 mg total) by mouth daily. Patient not taking: Reported on 03/31/2017 11/02/16   Reyne Dumas, MD  aspirin EC 81 MG tablet Take 1 tablet (81 mg total) by mouth daily. 04/01/17   [name], MD   Chest Portable 1 View  Result Date: 03/31/2017 CLINICAL DATA:  Preop chest exam for hip arthroplasty due to fracture. History of breast cancer. EXAM: PORTABLE CHEST 1 VIEW COMPARISON:  11/21/2016.  12/07/2016. FINDINGS: The heart size and mediastinal contours are within normal  limits. Both lungs are clear. The visualized skeletal structures are unremarkable. Aortic atherosclerosis. IMPRESSION: No active disease.  Stable exam. Electronically Signed   By: Staci Righter M.D.   On: 03/31/2017 15:01   Xr Hip Unilat W Or W/o Pelvis 2-3 Views Left  Result Date: 03/31/2017 2 view radiographs of the left hip shows a displaced impacted femoral neck fracture with displacement.  Xr Hip Unilat W Or W/o Pelvis 2-3 Views Right  Result Date: 03/31/2017 2 view radiographs of the right hip shows stable internal fixation of the intertrochanteric hip fracture.  No complicating features no hardware failure.  - pertinent xrays, CT, MRI studies were reviewed and independently interpreted  Positive ROS: All other systems have been reviewed and were otherwise negative with the exception of those mentioned in the HPI and as above.  Physical Exam: General: not Alert, screams out in pain for her left hip Psychiatric: Patient is not competent for consent with agitation. Lymphatic: No axillary or cervical lymphadenopathy Cardiovascular: No pedal edema Respiratory: No cyanosis, no use of accessory musculature GI: No organomegaly, abdomen is soft and non-tender  Skin: Examination the skin is intact no breakdown no bruising no ecchymosis.  Neurologic: Patient does  have protective sensation bilateral  lower extremities.   MUSCULOSKELETAL:  Patient is not alert or oriented.  She is unable to weight-bear she has pain with internal and external rotation of the left hip.  Radiographs shows an impacted displaced femoral neck fracture.  Assessment: Assessment: Left impacted femoral neck fracture.  Plan: Plan: Due to the patient's pain her family elected to proceed with hemiarthroplasty.  Risks and benefits were discussed including infection neurovascular injury dislocation DVT mortality of surgery need for additional surgery.  Patient family wished to proceed at this time.  Thank you for the consult and the opportunity to see Ms. Leodis Liverpool, MD [place] [phone] 1:07 PM

## 2017-04-01 NOTE — Transfer of Care (Signed)
Immediate Anesthesia Transfer of Care Note  Patient: Kimberly Acevedo  Procedure(s) Performed: ARTHROPLASTY BIPOLAR LEFT HIP (HEMIARTHROPLASTY) (Left Hip)  Patient Location: PACU  Anesthesia Type:General  Level of Consciousness: awake and patient cooperative  Airway & Oxygen Therapy: Patient Spontanous Breathing and Patient connected to face mask oxygen  Post-op Assessment: Report given to RN and Post -op Vital signs reviewed and stable  Post vital signs: Reviewed  Last Vitals:  Vitals:   04/01/17 0840 04/01/17 0906  BP: (!) 170/58   Pulse: 76   Resp:  16  Temp: 36.6 C   SpO2: 94%     Last Pain:  Vitals:   04/01/17 0840  TempSrc: Oral         Complications: No apparent anesthesia complications

## 2017-04-01 NOTE — Anesthesia Preprocedure Evaluation (Addendum)
Anesthesia Evaluation  Patient identified by MRN, date of birth, ID band Patient awake    Reviewed: Allergy & Precautions, NPO status , Patient's Chart, lab work & pertinent test results  History of Anesthesia Complications Negative for: history of anesthetic complications  Airway Mallampati: II  TM Distance: >3 FB Neck ROM: Limited    Dental  (+) Dental Advisory Given, Poor Dentition, Chipped, Missing   Pulmonary neg pulmonary ROS,    breath sounds clear to auscultation       Cardiovascular hypertension, Pt. on medications (-) angina Rhythm:Regular Rate:Normal     Neuro/Psych Depression dementianegative neurological ROS     GI/Hepatic negative GI ROS, Neg liver ROS,   Endo/Other  negative endocrine ROS  Renal/GU negative Renal ROS     Musculoskeletal  (+) Arthritis , Fibromyalgia -  Abdominal   Peds  Hematology negative hematology ROS (+)   Anesthesia Other Findings H/o breast cancer  Reproductive/Obstetrics                           Anesthesia Physical Anesthesia Plan  ASA: III  Anesthesia Plan: General   Post-op Pain Management:    Induction: Intravenous  PONV Risk Score and Plan: 4 or greater and Ondansetron, Dexamethasone and Treatment may vary due to age or medical condition  Airway Management Planned: Oral ETT  Additional Equipment:   Intra-op Plan:   Post-operative Plan: Extubation in OR  Informed Consent: I have reviewed the patients History and Physical, chart, labs and discussed the procedure including the risks, benefits and alternatives for the proposed anesthesia with the patient or authorized representative who has indicated his/her understanding and acceptance.   Dental advisory given and Consent reviewed with POA  Plan Discussed with: Surgeon and CRNA  Anesthesia Plan Comments: (Plan routine monitors, GETA Daughter understands pt will need intubation and  ventilation intra-op, agrees to full resuscitation until pt leaves PACU)        Anesthesia Quick Evaluation

## 2017-04-01 NOTE — Op Note (Signed)
Date of Surgery: 04/01/2017  INDICATIONS: Kimberly Acevedo is a 83 y.o.-year-old female who a skilled nursing resident with Alzheimer's dementia.  Patient presented with a femoral neck fracture of etiology.Marland Kitchen  PREOPERATIVE DIAGNOSIS: Left femoral neck fracture.  POSTOPERATIVE DIAGNOSIS: Same.  PROCEDURE: Hemiarthroplasty femoral neck fracture   SURGEON: Sharol Given, M.D.  ANESTHESIA:  general  IV FLUIDS AND URINE: See anesthesia.  ESTIMATED BLOOD LOSS: min mL.  COMPLICATIONS: None.  COMPONENT SIZES: [place] basic with size 6 femoral stem +0 neck and 50 mm head  DESCRIPTION OF PROCEDURE: The patient was brought to the operating room and underwent a general anesthetic. After adequate levels of anesthesia were obtained patient was placed in the lateral decubitus position with the hip fracture side up and axillary roll was placed the arms were padded and the mark II supports were placed. The lower extremity was prepped using DuraPrep draped into a sterile field Ioban was used to cover all exposed skin. A timeout was called. A posterior lateral incision was made this was carried down through the tensor fascia lata which was split. Retractors were placed. The piriformis short external rotators and capsule was incised off the femoral neck and retracted with Ethibond suture. The femoral neck cut was made 1 cm proximal to the calcar. The head was delivered. The head was sized and the appropriate size had a good suction fit. The femoral canal was sequentially broached to the proper size. The hip was trialed and this had a good stable fit. The wound was irrigated with normal saline. The femoral component and head were inserted this was again reduced. The hip was stable. The wound was irrigated with normal saline. The capsule short external rotators and piriformis were reapproximated with #1 Ethibond. The tensor fascia lata was closed using #1 Vicryl. Subcutaneous was closed using 0 Vicryl the skin was closed  using staples. A Mepilex dressing was applied. Patient was extubated taken to the PACU in stable condition. Kimberly Score, MD [place] 1:02 PM

## 2017-04-01 NOTE — Anesthesia Postprocedure Evaluation (Signed)
Anesthesia Post Note  Patient: Lianne Cure  Procedure(s) Performed: ARTHROPLASTY BIPOLAR LEFT HIP (HEMIARTHROPLASTY) (Left Hip)     Patient location during evaluation: PACU Anesthesia Type: General Level of consciousness: awake and alert and patient cooperative Pain management: pain level controlled Vital Signs Assessment: post-procedure vital signs reviewed and stable Respiratory status: spontaneous breathing, nonlabored ventilation, respiratory function stable and patient connected to nasal cannula oxygen Cardiovascular status: blood pressure returned to baseline and stable Postop Assessment: no apparent nausea or vomiting Anesthetic complications: no    Last Vitals:  Vitals:   04/01/17 1349 04/01/17 1400  BP: 131/62 (!) 130/49  Pulse: 95 97  Resp: 18 18  Temp: 36.7 C 36.4 C  SpO2: 100% 100%    Last Pain:  Vitals:   04/01/17 1400  TempSrc: Axillary  PainSc:                  Mareon Robinette,E. Ronella Plunk

## 2017-04-01 NOTE — Progress Notes (Signed)
ANTICOAGULATION CONSULT NOTE - Initial Consult  Pharmacy Consult for Coumadin Indication: VTE prophylaxis s/p Ortho procedure  Allergies  Allergen Reactions  . Penicillins Swelling and Rash    Has patient had a PCN reaction causing immediate rash, facial/tongue/throat swelling, SOB or lightheadedness with hypotension: No Has patient had a PCN reaction causing severe rash involving mucus membranes or skin necrosis: No Has patient had a PCN reaction that required hospitalization No Has patient had a PCN reaction occurring within the last 10 years: No If all of the above answers are "NO", then may proceed with Cephalosporin use.  . Statins   . Sulfa Drugs Cross Reactors Shortness Of Breath  . Tramadol Hcl Anaphylaxis  . Aspirin Swelling  . Codeine Other (See Comments)    unknown  . Morphine And Related Other (See Comments)    Hypoglycemia    Patient Measurements: Height: 5\' 8"  (172.7 cm) Weight: 143 lb (64.9 kg) IBW/kg (Calculated) : 63.9  Assessment: 83 yo F presents with L hip pain. Now s/p L THA to start Coumadin for VTE prophylaxis. No anticoag PTA. CBC stable.  Goal of Therapy:  INR 2-3 Monitor platelets by anticoagulation protocol: Yes   Plan:  Give Coumadin 2.5mg  PO x 1 tonight Monitor daily INR, CBC, s/s of bleed   Elenor Quinones, PharmD, BCPS Clinical Pharmacist Pager [phone] 04/01/2017 3:10 PM

## 2017-04-01 NOTE — Progress Notes (Signed)
PROGRESS NOTE    Kimberly Acevedo  PZW:[id number] DOB: 1928-07-19 DOA: 03/31/2017 PCP: Deland Pretty, MD   Specialists:     Brief Narrative:  83 year old female resident of [facility] History of right hip fracture 10/2016 HTN Inner left breast mass status post lumpectomy 07/04/12-Dr. Harlow Asa Prior lower GI bleed secondary to possible diverticulitis/diverticulosis 11/2015  Prior hemorrhoidopexy Dr. Marcello Moores 7/16 Mild to moderate dementia confounding with depression Reflux Obstructive sleep apnea supposed to be on CPAP Hyperglycemia on last admission 10/2016 A1c was 6.3 Questionable allergies to narcotics   readmitted from skilled nursing facility with unwitnessed fall found to have left hip fracture Orthopedics Dr. Sharol Given consulted regarding management  Assessment & Plan:   Active Problems:   Hypertension, essential, benign   Fibromyalgia   Achalasia, esophageal   Invasive lobular carcinoma of breast, stage 1 (HCC)   Depression   Dementia with behavioral disturbance   GERD (gastroesophageal reflux disease)   Dementia   OSA on CPAP   S/p left hip fracture   Left hip fracture  Defer management of same to orthopedics at this time  Did fairly well with surgery 10/2016 do not feel need any further clearance from cardiology at this time  Prior lower GI bleeding thought to be diverticular  Continue PPI and antireflux measures  Would defer anticoagulation to orthopedics-looks like patient managed on short-term anticoagulation after last hip surgery--aspirin 325?   dementia/depression  Continue sertraline 50 daily--monitor mentation postop  Previously was on Depakote 375 twice daily presumably for mood stabilization--will inquire with family  Would continue Ativan 0.5 3 times daily as needed anxiety/confusion if needed  Hypertension  Continue Cozaar 50 mg daily  Probable osteoporosis  Given age as well as multiple hip fractures, empirically treat with  vitamin D suggest outpatient further workup   Hemorrhoids  If needed use Anusol suppositories      Lovenox for now Inpatient pending surgery Called to update daughter--no answer Await surgery expect 48 hours prior to discharge   Consultants:   Orthopedics Dr. Sharol Given  Procedures:   None is yet  Antimicrobials:   Perioperative   Subjective:  Is a little confused Does not seem to be in pain at present Tells me that she is in [place] but otherwise does not recall circumstances leading up to fall  Objective: Vitals:   03/31/17 1933 03/31/17 1945 03/31/17 2127 04/01/17 0451  BP: (!) 151/69 (!) 162/69 (!) 161/65 140/60  Pulse: 72 71 73 69  Resp: 12 12 18 16   Temp:   (!) 97.5 F (36.4 C) 97.7 F (36.5 C)  TempSrc:   Axillary Oral  SpO2: 97% 96% 100% 97%  Weight:    65.2 kg (143 lb 12.8 oz)    Intake/Output Summary (Last 24 hours) at 04/01/17 0719 Last data filed at 04/01/17 0450  Gross per 24 hour  Intake           863.75 ml  Output              550 ml  Net           313.75 ml   Filed Weights   04/01/17 0451  Weight: 65.2 kg (143 lb 12.8 oz)    Examination:  Alert confused a little bit No distress however no cardiopulmonary embarrassment S1-S2 no murmur Chest clear Power 5/5 upper extremities wiggles toes left lower extremity is externally rotated   Data Reviewed: I have personally reviewed following labs and imaging studies  CBC:  Recent Labs Lab 03/31/17 1257 04/01/17 0410  WBC 10.1 8.3  NEUTROABS 7.0  --   HGB 15.0 12.6  HCT 44.8 39.8  MCV 95.1 95.2  PLT 330 128   Basic Metabolic Panel:  Recent Labs Lab 03/31/17 1257 04/01/17 0410  NA 138 139  K 3.4* 3.4*  CL 101 103  CO2 27 27  GLUCOSE 102* 90  BUN 15 10  CREATININE 0.62 0.54  CALCIUM 8.8* 8.5*   GFR: Estimated Creatinine Clearance: 50.9 mL/min (by C-G formula based on SCr of 0.54 mg/dL). Liver Function Tests:  Recent Labs Lab 04/01/17 0410  AST 20  ALT 11*    ALKPHOS 55  BILITOT 0.6  PROT 5.9*  ALBUMIN 2.6*   No results for input(s): LIPASE, AMYLASE in the last 168 hours. No results for input(s): AMMONIA in the last 168 hours. Coagulation Profile:  Recent Labs Lab 03/31/17 1257 04/01/17 0410  INR 1.03 1.10   Cardiac Enzymes: No results for input(s): CKTOTAL, CKMB, CKMBINDEX, TROPONINI in the last 168 hours. BNP (last 3 results) No results for input(s): PROBNP in the last 8760 hours. HbA1C: No results for input(s): HGBA1C in the last 72 hours. CBG: No results for input(s): GLUCAP in the last 168 hours. Lipid Profile: No results for input(s): CHOL, HDL, LDLCALC, TRIG, CHOLHDL, LDLDIRECT in the last 72 hours. Thyroid Function Tests: No results for input(s): TSH, T4TOTAL, FREET4, T3FREE, THYROIDAB in the last 72 hours. Anemia Panel: No results for input(s): VITAMINB12, FOLATE, FERRITIN, TIBC, IRON, RETICCTPCT in the last 72 hours. Urine analysis:    Component Value Date/Time   COLORURINE YELLOW 04/01/2017 0340   APPEARANCEUR HAZY (A) 04/01/2017 0340   LABSPEC 1.023 04/01/2017 0340   PHURINE 6.0 04/01/2017 0340   GLUCOSEU NEGATIVE 04/01/2017 0340   HGBUR NEGATIVE 04/01/2017 0340   BILIRUBINUR NEGATIVE 04/01/2017 0340   KETONESUR 5 (A) 04/01/2017 0340   PROTEINUR NEGATIVE 04/01/2017 0340   UROBILINOGEN 1.0 01/11/2015 1030   NITRITE NEGATIVE 04/01/2017 0340   LEUKOCYTESUR NEGATIVE 04/01/2017 0340     Radiology Studies: Reviewed images personally in health database    Scheduled Meds: . chlorhexidine  60 mL Topical Once  . cholecalciferol  2,000 Units Oral Daily  . losartan  50 mg Oral Daily  . potassium chloride SA  40 mEq Oral Once  . sertraline  100 mg Oral Daily  . vitamin B-12  1,000 mcg Oral Daily   Continuous Infusions: . sodium chloride 75 mL/hr at 03/31/17 1529  . sodium chloride    . clindamycin (CLEOCIN) IV       LOS: 1 day    Time spent: Brimhall Nizhoni, MD [facility]   If 7PM-7AM, please contact night-coverage [email or web address] Password [email or web address] 04/01/2017, 7:19 AM

## 2017-04-01 NOTE — Interval H&P Note (Signed)
History and Physical Interval Note:  04/01/2017 10:10 AM  Kimberly Acevedo  has presented today for surgery, with the diagnosis of Left Femoral Neck Fracture  The various methods of treatment have been discussed with the patient and family. After consideration of risks, benefits and other options for treatment, the patient has consented to  Procedure(s): ARTHROPLASTY BIPOLAR LEFT HIP (HEMIARTHROPLASTY) (Left) as a surgical intervention .  The patient's history has been reviewed, patient examined, no change in status, stable for surgery.  I have reviewed the patient's chart and labs.  Questions were answered to the patient's satisfaction.     [name]

## 2017-04-01 NOTE — Telephone Encounter (Signed)
Faxed to [phone]

## 2017-04-01 NOTE — Telephone Encounter (Signed)
Sharyn Lull from [employer] [facility] [employer] was asking for the last office note to be faxed over to [phone]

## 2017-04-01 NOTE — Progress Notes (Signed)
PT Cancellation Note  Patient Details Name: ISBELLA ARLINE MRN: [id number] DOB: 30-Sep-1928   Cancelled Treatment:    Reason Eval/Treat Not Completed: Patient not medically ready; patient currently on bedrest awaiting hip fx fixation.  Will attempt to see following surgery.    Reginia Naas 04/01/2017, 8:38 AM  Magda Kiel, PT [phone] 04/01/2017

## 2017-04-01 NOTE — Progress Notes (Signed)
OT Cancellation Note  Patient Details Name: Kimberly Acevedo MRN: [id number] DOB: Nov 05, 1928   Cancelled Treatment:    Reason Eval/Treat Not Completed: Patient at procedure or test/ unavailable. Currently in OR; will follow up for OT eval post op.  Binnie Kand M.S., OTR/L Pager: [phone]  04/01/2017, 10:58 AM

## 2017-04-01 NOTE — Progress Notes (Signed)
Physical medicine rehabilitation consult requested chart reviewed. Patient is a resident of [facility] skilled nursing facility. Plan is for surgical intervention of left hip fracture 04/01/2017. Patient will not require inpatient rehabilitation services with plan to return back to skilled nursing facility.

## 2017-04-01 NOTE — Discharge Instructions (Signed)
INSTRUCTIONS AFTER JOINT REPLACEMENT  ° °o Remove items at home which could result in a fall. This includes throw rugs or furniture in walking pathways °o ICE to the affected joint every three hours while awake for 30 minutes at a time, for at least the first 3-5 days, and then as needed for pain and swelling.  Continue to use ice for pain and swelling. You may notice swelling that will progress down to the foot and ankle.  This is normal after surgery.  Elevate your leg when you are not up walking on it.   °o Continue to use the breathing machine you got in the hospital (incentive spirometer) which will help keep your temperature down.  It is common for your temperature to cycle up and down following surgery, especially at night when you are not up moving around and exerting yourself.  The breathing machine keeps your lungs expanded and your temperature down. ° ° °DIET:  As you were doing prior to hospitalization, we recommend a well-balanced diet. ° °DRESSING / WOUND CARE / SHOWERING ° °You may change your dressing 3-5 days after surgery.  Then change the dressing every day with sterile gauze.  Please use good hand washing techniques before changing the dressing.  Do not use any lotions or creams on the incision until instructed by your surgeon. ° °ACTIVITY ° °o Increase activity slowly as tolerated, but follow the weight bearing instructions below.   °o No driving for 6 weeks or until further direction given by your physician.  You cannot drive while taking narcotics.  °o No lifting or carrying greater than 10 lbs. until further directed by your surgeon. °o Avoid periods of inactivity such as sitting longer than an hour when not asleep. This helps prevent blood clots.  °o You may return to work once you are authorized by your doctor.  ° ° ° °WEIGHT BEARING  ° °Weight bearing as tolerated with assist device (Klich, cane, etc) as directed, use it as long as suggested by your surgeon or therapist, typically at  least 4-6 weeks. ° ° °EXERCISES ° °Results after joint replacement surgery are often greatly improved when you follow the exercise, range of motion and muscle strengthening exercises prescribed by your doctor. Safety measures are also important to protect the joint from further injury. Any time any of these exercises cause you to have increased pain or swelling, decrease what you are doing until you are comfortable again and then slowly increase them. If you have problems or questions, call your caregiver or physical therapist for advice.  ° °Rehabilitation is important following a joint replacement. After just a few days of immobilization, the muscles of the leg can become weakened and shrink (atrophy).  These exercises are designed to build up the tone and strength of the thigh and leg muscles and to improve motion. Often times heat used for twenty to thirty minutes before working out will loosen up your tissues and help with improving the range of motion but do not use heat for the first two weeks following surgery (sometimes heat can increase post-operative swelling).  ° °These exercises can be done on a training (exercise) mat, on the floor, on a table or on a bed. Use whatever works the best and is most comfortable for you.    Use music or television while you are exercising so that the exercises are a pleasant break in your day. This will make your life better with the exercises acting as a break   in your routine that you can look forward to.   Perform all exercises about fifteen times, three times per day or as directed.  You should exercise both the operative leg and the other leg as well. ° °Exercises include: °  °• Quad Sets - Tighten up the muscle on the front of the thigh (Quad) and hold for 5-10 seconds.   °• Straight Leg Raises - With your knee straight (if you were given a brace, keep it on), lift the leg to 60 degrees, hold for 3 seconds, and slowly lower the leg.  Perform this exercise against  resistance later as your leg gets stronger.  °• Leg Slides: Lying on your back, slowly slide your foot toward your buttocks, bending your knee up off the floor (only go as far as is comfortable). Then slowly slide your foot back down until your leg is flat on the floor again.  °• Angel Wings: Lying on your back spread your legs to the side as far apart as you can without causing discomfort.  °• Hamstring Strength:  Lying on your back, push your heel against the floor with your leg straight by tightening up the muscles of your buttocks.  Repeat, but this time bend your knee to a comfortable angle, and push your heel against the floor.  You may put a pillow under the heel to make it more comfortable if necessary.  ° °A rehabilitation program following joint replacement surgery can speed recovery and prevent re-injury in the future due to weakened muscles. Contact your doctor or a physical therapist for more information on knee rehabilitation.  ° ° °CONSTIPATION ° °Constipation is defined medically as fewer than three stools per week and severe constipation as less than one stool per week.  Even if you have a regular bowel pattern at home, your normal regimen is likely to be disrupted due to multiple reasons following surgery.  Combination of anesthesia, postoperative narcotics, change in appetite and fluid intake all can affect your bowels.  ° °YOU MUST use at least one of the following options; they are listed in order of increasing strength to get the job done.  They are all available over the counter, and you may need to use some, POSSIBLY even all of these options:   ° °Drink plenty of fluids (prune juice may be helpful) and high fiber foods °Colace 100 mg by mouth twice a day  °Senokot for constipation as directed and as needed Dulcolax (bisacodyl), take with full glass of water  °Miralax (polyethylene glycol) once or twice a day as needed. ° °If you have tried all these things and are unable to have a bowel  movement in the first 3-4 days after surgery call either your surgeon or your primary doctor.   ° °If you experience loose stools or diarrhea, hold the medications until you stool forms back up.  If your symptoms do not get better within 1 week or if they get worse, check with your doctor.  If you experience "the worst abdominal pain ever" or develop nausea or vomiting, please contact the office immediately for further recommendations for treatment. ° ° °ITCHING:  If you experience itching with your medications, try taking only a single pain pill, or even half a pain pill at a time.  You can also use Benadryl over the counter for itching or also to help with sleep.  ° °TED HOSE STOCKINGS:  Use stockings on both legs until for at least 2 weeks or as   directed by physician office. They may be removed at night for sleeping.  MEDICATIONS:  See your medication summary on the After Visit Summary that nursing will review with you.  You may have some home medications which will be placed on hold until you complete the course of blood thinner medication.  It is important for you to complete the blood thinner medication as prescribed.  PRECAUTIONS:  If you experience chest pain or shortness of breath - call 911 immediately for transfer to the hospital emergency department.   If you develop a fever greater that 101 F, purulent drainage from wound, increased redness or drainage from wound, foul odor from the wound/dressing, or calf pain - CONTACT YOUR SURGEON.                                                   FOLLOW-UP APPOINTMENTS:  If you do not already have a post-op appointment, please call the office for an appointment to be seen by your surgeon.  Guidelines for how soon to be seen are listed in your After Visit Summary, but are typically between 1-4 weeks after surgery.  OTHER INSTRUCTIONS:   Knee Replacement:  Do not place pillow under knee, focus on keeping the knee straight while resting. CPM  instructions: 0-90 degrees, 2 hours in the morning, 2 hours in the afternoon, and 2 hours in the evening. Place foam block, curve side up under heel at all times except when in CPM or when walking.  DO NOT modify, tear, cut, or change the foam block in any way.  MAKE SURE YOU:   Understand these instructions.   Get help right away if you are not doing well or get worse.    Thank you for letting us be a part of your medical care team.  It is a privilege we respect greatly.  We hope these instructions will help you stay on track for a fast and full recovery!    Information on my medicine - Coumadin   (Warfarin)  Why was Coumadin prescribed for you? Coumadin was prescribed for you because you have a blood clot or a medical condition that can cause an increased risk of forming blood clots. Blood clots can cause serious health problems by blocking the flow of blood to the heart, lung, or brain. Coumadin can prevent harmful blood clots from forming. As a reminder your indication for Coumadin is:   Blood Clot Prevention After Orthopedic Surgery   What test will check on my response to Coumadin? While on Coumadin (warfarin) you will need to have an INR test regularly to ensure that your dose is keeping you in the desired range. The INR (international normalized ratio) number is calculated from the result of the laboratory test called prothrombin time (PT).  If an INR APPOINTMENT HAS NOT ALREADY BEEN MADE FOR YOU please schedule an appointment to have this lab work done by your health care provider within 7 days. Your INR goal is usually a number between:  2 to 3 or your provider may give you a more narrow range like 2-2.5.  Ask your health care provider during an office visit what your goal INR is.  What  do you need to  know  About  COUMADIN? Take Coumadin (warfarin) exactly as prescribed by your healthcare provider about the same time each day.  DO NOT stop taking without talking to the doctor  who prescribed the medication.  Stopping without other blood clot prevention medication to take the place of Coumadin may increase your risk of developing a new clot or stroke.  Get refills before you run out.  What do you do if you miss a dose? If you miss a dose, take it as soon as you remember on the same day then continue your regularly scheduled regimen the next day.  Do not take two doses of Coumadin at the same time.  Important Safety Information A possible side effect of Coumadin (Warfarin) is an increased risk of bleeding. You should call your healthcare provider right away if you experience any of the following: ? Bleeding from an injury or your nose that does not stop. ? Unusual colored urine (red or dark brown) or unusual colored stools (red or black). ? Unusual bruising for unknown reasons. ? A serious fall or if you hit your head (even if there is no bleeding).  Some foods or medicines interact with Coumadin (warfarin) and might alter your response to warfarin. To help avoid this: ? Eat a balanced diet, maintaining a consistent amount of Vitamin K. ? Notify your provider about major diet changes you plan to make. ? Avoid alcohol or limit your intake to 1 drink for women and 2 drinks for men per day. (1 drink is 5 oz. wine, 12 oz. beer, or 1.5 oz. liquor.)  Make sure that ANY health care provider who prescribes medication for you knows that you are taking Coumadin (warfarin).  Also make sure the healthcare provider who is monitoring your Coumadin knows when you have started a new medication including herbals and non-prescription products.  Coumadin (Warfarin)  Major Drug Interactions  Increased Warfarin Effect Decreased Warfarin Effect  Alcohol (large quantities) Antibiotics (esp. Septra/Bactrim, Flagyl, Cipro) Amiodarone (Cordarone) Aspirin (ASA) Cimetidine (Tagamet) Megestrol (Megace) NSAIDs (ibuprofen, naproxen, etc.) Piroxicam (Feldene) Propafenone (Rythmol  SR) Propranolol (Inderal) Isoniazid (INH) Posaconazole (Noxafil) Barbiturates (Phenobarbital) Carbamazepine (Tegretol) Chlordiazepoxide (Librium) Cholestyramine (Questran) Griseofulvin Oral Contraceptives Rifampin Sucralfate (Carafate) Vitamin K   Coumadin (Warfarin) Major Herbal Interactions  Increased Warfarin Effect Decreased Warfarin Effect  Garlic Ginseng Ginkgo biloba Coenzyme Q10 Green tea St. Johns wort    Coumadin (Warfarin) FOOD Interactions  Eat a consistent number of servings per week of foods HIGH in Vitamin K (1 serving =  cup)  Collards (cooked, or boiled & drained) Kale (cooked, or boiled & drained) Mustard greens (cooked, or boiled & drained) Parsley *serving size only =  cup Spinach (cooked, or boiled & drained) Swiss chard (cooked, or boiled & drained) Turnip greens (cooked, or boiled & drained)  Eat a consistent number of servings per week of foods MEDIUM-HIGH in Vitamin K (1 serving = 1 cup)  Asparagus (cooked, or boiled & drained) Broccoli (cooked, boiled & drained, or raw & chopped) Brussel sprouts (cooked, or boiled & drained) *serving size only =  cup Lettuce, raw (green leaf, endive, romaine) Spinach, raw Turnip greens, raw & chopped   These websites have more information on Coumadin (warfarin):  FailFactory.se; VeganReport.com.au;

## 2017-04-02 DIAGNOSIS — F0151 Vascular dementia with behavioral disturbance: Secondary | ICD-10-CM

## 2017-04-02 DIAGNOSIS — Z01818 Encounter for other preprocedural examination: Secondary | ICD-10-CM

## 2017-04-02 LAB — CBC
HCT: 34.2 % — ABNORMAL LOW (ref 36.0–46.0)
HEMOGLOBIN: 11.1 g/dL — AB (ref 12.0–15.0)
MCH: 30.4 pg (ref 26.0–34.0)
MCHC: 32.5 g/dL (ref 30.0–36.0)
MCV: 93.7 fL (ref 78.0–100.0)
Platelets: 251 10*3/uL (ref 150–400)
RBC: 3.65 MIL/uL — ABNORMAL LOW (ref 3.87–5.11)
RDW: 13.1 % (ref 11.5–15.5)
WBC: 11.3 10*3/uL — ABNORMAL HIGH (ref 4.0–10.5)

## 2017-04-02 LAB — BASIC METABOLIC PANEL
ANION GAP: 7 (ref 5–15)
BUN: 9 mg/dL (ref 6–20)
CHLORIDE: 98 mmol/L — AB (ref 101–111)
CO2: 30 mmol/L (ref 22–32)
Calcium: 8.1 mg/dL — ABNORMAL LOW (ref 8.9–10.3)
Creatinine, Ser: 0.62 mg/dL (ref 0.44–1.00)
Glucose, Bld: 118 mg/dL — ABNORMAL HIGH (ref 65–99)
POTASSIUM: 3.7 mmol/L (ref 3.5–5.1)
SODIUM: 135 mmol/L (ref 135–145)

## 2017-04-02 LAB — PROTIME-INR
INR: 1.08
PROTHROMBIN TIME: 13.9 s (ref 11.4–15.2)

## 2017-04-02 MED ORDER — WARFARIN SODIUM 5 MG PO TABS
2.5000 mg | ORAL_TABLET | Freq: Once | ORAL | Status: DC
Start: 2017-04-02 — End: 2017-04-03

## 2017-04-02 MED ORDER — WARFARIN SODIUM 5 MG PO TABS: 5.0000 mg | ORAL_TABLET | Freq: Once | ORAL | Status: DC

## 2017-04-02 NOTE — [place] FL2 (Signed)
[place] MEDICAID FL2 LEVEL OF CARE SCREENING TOOL     IDENTIFICATION  Patient Name: Kimberly Acevedo Birthdate: 1929-05-03 Sex: female Admission Date (Current Location): 03/31/2017  [facility] and [place] Number:  Herbalist and Address:  The . [facility], [place] [address], [place], [place] [address]      Provider Number:    Attending Physician Name and Address:  Nita Sells, MD  Relative Name and Phone Number:       Current Level of Care: Hospital Recommended Level of Care: [place] Prior Approval Number:    Date Approved/Denied:   PASRR Number:    Discharge Plan: Other (Comment) ([name])    Current Diagnoses: Patient Active Problem List   Diagnosis Date Noted  . Left displaced femoral neck fracture ([place])   . S/p left hip fracture 03/31/2017  . Pressure injury of skin 11/02/2016  . Hyperglycemia 10/31/2016  . Closed fracture of neck of left femur ([place]) 10/30/2016  . OSA on CPAP 10/30/2016  . Volume depletion   . GI bleed 11/20/2015  . Gastrointestinal hemorrhage associated with intestinal diverticulitis   . Diverticulitis of colon   . Intra-abdominal hematoma 01/06/2015  . Normocytic normochromic anemia 01/06/2015  . Nausea vomiting and diarrhea 01/06/2015  . Bleeding internal hemorrhoids 07/16/2014  . Anal fissure - posterior 06/17/2014  . Dementia 06/15/2014  . Generalized abdominal pain   . Abdominal pain   . Pancreatitis 05/24/2014  . Leukocytosis 05/24/2014  . GERD (gastroesophageal reflux disease) 05/24/2014  . Dementia with behavioral disturbance 02/27/2014  . Rectal bleeding 05/21/2013  . Hypokalemia 05/21/2013  . Depression 05/21/2013  . Invasive lobular carcinoma of breast, stage 1 ([place]) 06/23/2012  . Hypertension, essential, benign   . Fibromyalgia   . Achalasia, esophageal     Orientation RESPIRATION BLADDER Height & Weight     Self  O2, Normal ([place] as needed; see DC summary) Incontinent,  External catheter (03/31/17) Weight: 143 lb 4.8 oz (65 kg) Height:  5\' 8"  (172.7 cm)  BEHAVIORAL SYMPTOMS/MOOD NEUROLOGICAL BOWEL NUTRITION STATUS      Incontinent    AMBULATORY STATUS COMMUNICATION OF NEEDS Skin   Extensive Assist Verbally Surgical wounds (left hip closed incision 04/01/17, adhesive bandage)                       Personal Care Assistance Level of Assistance  Bathing, Dressing, Feeding Bathing Assistance: Maximum assistance Feeding assistance: Limited assistance Dressing Assistance: Maximum assistance     Functional Limitations Info  Sight, Hearing, Speech Sight Info: Adequate Hearing Info: Adequate Speech Info: Impaired (speech is slightly garbled)    [place]  PT (By licensed PT), OT (By licensed OT)     PT Frequency: 3x/wk with home health OT Frequency: 3x/wk with home health            Contractures      Additional Factors Info  Code Status, Allergies, Psychotropic Code Status Info: DNR Allergies Info: Penicillins, Statins, Sulfa Drugs Cross Reactors, Tramadol Hcl, Aspirin, Codeine, Morphine And Related Psychotropic Info: Depakote sprinkle 375 mg 2x/day; Zoloft 100mg  daily         Current Medications (04/02/2017):  This is the current hospital active medication list Current Facility-Administered Medications  Medication Dose Route Frequency Provider Last Rate Last Dose  . 0.9 %  sodium chloride infusion   Intravenous Continuous Rondel Jumbo, PA-C 75 mL/hr at 04/01/17 2143    . 0.9 %  sodium chloride infusion   Intravenous Continuous Rondel Jumbo, PA-C      . acetaminophen (TYLENOL) tablet 650 mg  650 mg Oral Q6H PRN Rondel Jumbo, PA-C       Or  . acetaminophen (TYLENOL) suppository 650 mg  650 mg Rectal Q6H PRN Rondel Jumbo, PA-C      . bisacodyl (DULCOLAX) EC tablet 5 mg  5 mg Oral Daily PRN Rondel Jumbo, PA-C      . bisacodyl (DULCOLAX) suppository 10 mg  10 mg Rectal Daily PRN Rondel Jumbo, PA-C       . cholecalciferol (VITAMIN D) tablet 2,000 Units  2,000 Units Oral Daily Rondel Jumbo, PA-C   2,000 Units at 04/02/17 1109  . divalproex (DEPAKOTE SPRINKLE) capsule 375 mg  375 mg Oral BID Nita Sells, MD   375 mg at 04/02/17 1109  . HYDROcodone-acetaminophen (NORCO/VICODIN) 5-325 MG per tablet 1-2 tablet  1-2 tablet Oral Q6H PRN [name], MD   2 tablet at 04/02/17 1455  . hydrocortisone (ANUSOL-HC) 2.5 % rectal cream 1 application  1 application Rectal TID PRN Nita Sells, MD      . lactated ringers infusion   Intravenous Continuous Annye Asa, MD 10 mL/hr at 04/01/17 1024    . LORazepam (ATIVAN) tablet 0.5 mg  0.5 mg Oral TID PRN Nita Sells, MD      . losartan (COZAAR) tablet 50 mg  50 mg Oral Daily Rondel Jumbo, PA-C   50 mg at 04/02/17 1109  . menthol-cetylpyridinium (CEPACOL) lozenge 3 mg  1 lozenge Oral PRN [name], MD       Or  . phenol (CHLORASEPTIC) mouth spray 1 spray  1 spray Mouth/Throat PRN [name], MD      . metoCLOPramide (REGLAN) tablet 5-10 mg  5-10 mg Oral Q8H PRN [name], MD       Or  . metoCLOPramide (REGLAN) injection 5-10 mg  5-10 mg Intravenous Q8H PRN [name], MD      . morphine 4 MG/ML injection 0.52 mg  0.52 mg Intravenous Q2H PRN Nita Sells, MD   0.52 mg at 04/02/17 1452  . ondansetron (ZOFRAN) tablet 4 mg  4 mg Oral Q6H PRN Sharene Butters E, PA-C       Or  . ondansetron (ZOFRAN) injection 4 mg  4 mg Intravenous Q6H PRN Wertman, Sara E, PA-C      . polyethylene glycol (MIRALAX / GLYCOLAX) packet 17 g  17 g Oral Daily PRN Nita Sells, MD      . potassium chloride SA (K-DUR,KLOR-CON) CR tablet 40 mEq  40 mEq Oral Once Sherwood Gambler, MD   Stopped at 03/31/17 1535  . senna-docusate (Senokot-S) tablet 1 tablet  1 tablet Oral QHS PRN Rondel Jumbo, PA-C      . sertraline (ZOLOFT) tablet 100 mg  100 mg Oral Daily Sharene Butters E, PA-C   100 mg at 04/02/17 1109  . sodium  phosphate (FLEET) 7-19 GM/118ML enema 1 enema  1 enema Rectal Once PRN Rondel Jumbo, PA-C      . vitamin B-12 (CYANOCOBALAMIN) tablet 1,000 mcg  1,000 mcg Oral Daily Rondel Jumbo, PA-C   1,000 mcg at 04/02/17 1109  . warfarin (COUMADIN) tablet 2.5 mg  2.5 mg Oral ONCE-1800 Priscella Mann, RPH      . Warfarin - Pharmacist Dosing Inpatient   Does not apply [address] Reginia Naas, [facility]  Discharge Medications: Please see discharge summary for a list of discharge medications.  Relevant Imaging Results:  Relevant Lab Results:   Additional Information SS#: [id number]  Geralynn Ochs, LCSW

## 2017-04-02 NOTE — Evaluation (Signed)
Physical Therapy Evaluation Patient Details Name: Kimberly Acevedo MRN: [id number] DOB: 06-03-1929 Today's Date: 04/02/2017   History of Present Illness  Patient presents to the hospital with an impacted femoral head fx. She had a hemiarthroplasty done on 04/01/2017. She had an IM nail done 2 months prior on the right hip. She is a high fall risk and has advanced dementia. PMH: HTN, Carcinoma, diverticulitis, breast cancer, fibromyalgia, dementia, arthritis  Clinical Impression  Patient requires significant assist for transfers. She was able to sit at the edge of the bed with assist but was unable to safely transfer to the chair. She is pleasant and can follow simple commands. She would benefit from further skilled acute therapy to improve her over all mobility and safety.     Follow Up Recommendations SNF    Equipment Recommendations  None recommended by PT    Recommendations for Other Services       Precautions / Restrictions Precautions Precautions: Posterior Hip;Fall Precaution Booklet Issued: No Precaution Comments: Patient could not comprehend percautions 2nd to advanced demntai. She did not break percuations though.  Required Braces or Orthoses: Knee Immobilizer - Left Restrictions Weight Bearing Restrictions: Yes Other Position/Activity Restrictions: WBAT      Mobility  Bed Mobility Overal bed mobility: Needs Assistance Bed Mobility: Supine to Sit;Sit to Supine     Supine to sit: +2 for physical assistance;Max assist Sit to supine: +2 for physical assistance;Max assist   General bed mobility comments: Max cuing to reach for handrails. Requiered max a to scoor her hips to the edge of the bed. Mod-max a to remain sitting 2nd to posterior push. Patient was able to sit at the edge of the bed for 10 minutes and was able to eat a cracker.   Transfers Overall transfer level: Needs assistance               General transfer comment: Ubnable to perfrom despite max  cuing and encouragement. Patient declined to attmept to stand 2nd to pain.   Ambulation/Gait             General Gait Details: unable to assess   Stairs            Wheelchair Mobility    Modified Rankin (Stroke Patients Only)       Balance Overall balance assessment: Needs assistance Sitting-balance support: Bilateral upper extremity supported Sitting balance-Leahy Scale: Poor Sitting balance - Comments: required mod-max a to reamin seated        Standing balance comment: unable to stand                              Pertinent Vitals/Pain Pain Assessment: Faces Faces Pain Scale: Hurts whole lot Pain Descriptors / Indicators: Aching Pain Intervention(s): Limited activity within patient's tolerance;Monitored during session;Premedicated before session;Repositioned    Home Living Family/patient expects to be discharged to:: Skilled nursing facility                      Prior Function Level of [place]: Needs assistance         Comments: Unable to assess prior level at nursing home 12nd to ddementia but note from previous admission reported min a for gait and transfers     Hand Dominance   Dominant Hand: Right    Extremity/Trunk Assessment   Upper Extremity Assessment Upper Extremity Assessment: Defer to OT evaluation    Lower Extremity Assessment Lower  Extremity Assessment: Generalized weakness;Difficult to assess due to impaired cognition       Communication   Communication:  (confused but able to communicate )  Cognition Arousal/Alertness: Awake/alert Behavior During Therapy: Anxious Overall Cognitive Status: History of cognitive impairments - at baseline                                 General Comments: Hisdtory of dementia. Able to aswer simple questions. Unreliable historian. Able to follow simple commands       General Comments      Exercises     Assessment/Plan    PT Assessment Patient  needs continued PT services  PT Problem List Decreased strength;Decreased range of motion;Decreased balance;Decreased activity tolerance;Decreased cognition;Decreased knowledge of use of DME;Decreased safety awareness;Pain;Decreased knowledge of precautions       PT Treatment Interventions DME instruction;Gait training;Functional mobility training;Therapeutic activities;Therapeutic exercise;Balance training;Neuromuscular re-education;Patient/family education    PT Goals (Current goals can be found in the Care Plan section)  Acute Rehab PT Goals Patient Stated Goal: unable  PT Goal Formulation: Patient unable to participate in goal setting    Frequency Min 3X/week   Barriers to discharge        Co-evaluation PT/OT/SLP Co-Evaluation/Treatment: Yes Reason for Co-Treatment: Complexity of the patient's impairments (multi-system involvement);Necessary to address cognition/behavior during functional activity;For patient/therapist safety;To address functional/ADL transfers PT goals addressed during session: Mobility/safety with mobility;Balance;Proper use of DME;Strengthening/ROM         AM-PAC PT "6 Clicks" Daily Activity  Outcome Measure Difficulty turning over in bed (including adjusting bedclothes, sheets and blankets)?: A Lot Difficulty moving from lying on back to sitting on the side of the bed? : A Lot Difficulty sitting down on and standing up from a chair with arms (e.g., wheelchair, bedside commode, etc,.)?: Unable Help needed moving to and from a bed to chair (including a wheelchair)?: Total Help needed walking in hospital room?: Total Help needed climbing 3-5 steps with a railing? : Total 6 Click Score: 8    End of Session Equipment Utilized During Treatment: Left knee immobilizer Activity Tolerance: Patient tolerated treatment well Patient left: in bed;with call bell/phone within reach;with bed alarm set Nurse Communication: Mobility status (Patient requested food.  Nursing reports patient has already ) PT Visit Diagnosis: Unsteadiness on feet (R26.81);Other abnormalities of gait and mobility (R26.89);Repeated falls (R29.6);Muscle weakness (generalized) (M62.81);Difficulty in walking, not elsewhere classified (R26.2);Pain Pain - Right/Left: Left Pain - part of body: Hip    Time: 5621-3086 PT Time Calculation (min) (ACUTE ONLY): 40 min   Charges:   PT Evaluation $PT Eval Moderate Complexity: 1 Mod     PT G Codes:          Carney Living PT DPT  04/02/2017, 11:16 AM

## 2017-04-02 NOTE — Clinical Social Work Note (Signed)
Clinical Social Work Assessment  Patient Details  Name: Kimberly Acevedo MRN: [id number] Date of Birth: 10-23-1928  Date of referral:  04/02/17               Reason for consult:  Facility Placement                Permission sought to share information with:  Facility Sport and exercise psychologist, Family Supports Permission granted to share information::  Yes, Verbal Permission Granted  Name::     [name], advertising::  [name]  Relationship::  Daughter  Contact Information:     Housing/Transportation Living arrangements for the past 2 months:  [name] of Information:  Adult Children Patient Interpreter Needed:  None Criminal Activity/Legal Involvement Pertinent to Current Situation/Hospitalization:  No - Comment as needed Significant Relationships:  Adult Children Lives with:  Self, Facility Resident Do you feel safe going back to the place where you live?  Yes Need for family participation in patient care:  Yes (Comment) (patient has dementia)  Care giving concerns:  Patient has been at [name] and family has no concerns about care received there.   Social Worker assessment / plan:  CSW spoke with patient's daughter, Kimberly Acevedo, over the phone to discuss discharge plan. CSW discussed recommendation for SNF and insurance billing options between SNF and [name]. CSW ensured patient's daughter that orders for home health services could be sent over to [name] for patient to get rehab at the [name]. CSW to complete referral and follow to facilitate discharge back to [name].  Employment status:  Retired Nurse, adult PT Recommendations:  [name] / Referral to community resources:     Patient/Family's Response to care:  Patient's daughter is agreeable for patient to return to [name].  Patient/Family's Understanding of and Emotional Response to Diagnosis, Current Treatment, and Prognosis:  Patient's  daughter is aware that patient could admit to a SNF for rehab, but the patient will become more confused due to her dementia with the change in environment. Patient's daughter would like the patient to return to where she is familiar and to have rehab services work with her at her facility. Patient's daughter was curious if the insurance would cover any of the [name] bill if the patient was receiving rehab services, but was understanding that the insurance would only cover the PT and OT session and not the room and board.  Emotional Assessment Appearance:  Appears stated age Attitude/Demeanor/Rapport:  Unable to Assess Affect (typically observed):  Unable to Assess Orientation:  Oriented to Self Alcohol / Substance use:  Not Applicable Psych involvement (Current and /or in the community):  No (Comment)  Discharge Needs  Concerns to be addressed:  Care Coordination Readmission within the last 30 days:  No Current discharge risk:  Physical Impairment, Cognitively Impaired Barriers to Discharge:  Continued Medical Work up   [employer], [name] 04/02/2017, 3:30 PM

## 2017-04-02 NOTE — Progress Notes (Signed)
PROGRESS NOTE    Kimberly Acevedo  EUM:[id number] DOB: 18-Mar-1929 DOA: 03/31/2017 PCP: Deland Pretty, MD   Specialists:     Brief Narrative:  83 year old female resident of [facility] skilled [facility] History of right hip fracture 10/2016 HTN Inner left breast mass status post lumpectomy 07/04/12-Dr. Harlow Asa Prior lower GI bleed secondary to possible diverticulitis/diverticulosis 11/2015  Prior hemorrhoidopexy Dr. Marcello Moores 7/16 Mild to moderate dementia confounding with depression Reflux Obstructive sleep apnea supposed to be on CPAP Hyperglycemia on last admission 10/2016 A1c was 6.3 Questionable allergies to narcotics   readmitted from skilled nursing facility with unwitnessed fall found to have left hip fracture Orthopedics Dr. Sharol Given consulted regarding management  Assessment & Plan:   Active Problems:   Hypertension, essential, benign   Fibromyalgia   Achalasia, esophageal   Invasive lobular carcinoma of breast, stage 1 (HCC)   Depression   Dementia with behavioral disturbance   GERD (gastroesophageal reflux disease)   Dementia   OSA on CPAP   S/p left hip fracture   Left displaced femoral neck fracture (HCC)   Left hip fracture  Status post hemiarthroplasty 10/26  Did fairly well with surgery 10/2016 do not feel need any further clearance from cardiology at this time  Defer weightbearing status, anticoagulation, pain control, wound management to orthopedics  Therapy has evaluated patient will need skilled probably in the next 24 hours  Prior lower GI bleeding thought to be diverticular  Continue PPI and antireflux measures  Would defer anticoagulation to orthopedics-looks like patient managed on short-term anticoagulation after last hip surgery--aspirin 325?   dementia/depression  Continue sertraline 50 daily--she is pleasantly confused  Previously was on Depakote 375 twice daily presumably for mood stabilization--will inquire with family  Would continue  Ativan 0.5 3 times daily as needed anxiety/confusion if needed  Hypertension  Continue Cozaar 50 mg daily  Probable osteoporosis  Given age as well as multiple hip fractures, empirically treat with vitamin D suggest outpatient further workup   Hemorrhoids  If needed use Anusol suppositories      Lovenox for now Inpatient pending surgery No family present called on 10/26 Await surgery expect 48 hours prior to discharge   Consultants:   Orthopedics Dr. Sharol Given  Procedures:   None is yet  Antimicrobials:   Perioperative   Subjective:  Alert pleasant but confused Holding a baby doll in her hand and padding it No chest pain Cannot give a reliable ROS  Objective: Vitals:   04/01/17 1824 04/01/17 2037 04/02/17 0609 04/02/17 0646  BP: (!) 104/49 (!) 105/44 (!) 112/56   Pulse: 95 82 75   Resp: 18 17 16    Temp: 98.6 F (37 C) 97.6 F (36.4 C) 98.3 F (36.8 C)   TempSrc: Oral Axillary Axillary   SpO2: 96% 98% 94%   Weight:    65 kg (143 lb 4.8 oz)  Height:        Intake/Output Summary (Last 24 hours) at 04/02/17 1051 Last data filed at 04/02/17 0831  Gross per 24 hour  Intake             1460 ml  Output              900 ml  Net              560 ml   Filed Weights   04/01/17 0451 04/01/17 1020 04/02/17 0646  Weight: 65.2 kg (143 lb 12.8 oz) 64.9 kg (143 lb) 65 kg (143 lb 4.8 oz)  Examination:  Alert but confused Tells me correctly that she is in [place] but the rest of her speech is confused Chest clear no added sounds S1-S2 no murmur Abdomen mildly distended Passive motion of lower extremities is slightly painful   Data Reviewed: I have personally reviewed following labs and imaging studies  CBC:  Recent Labs Lab 03/31/17 1257 04/01/17 0410 04/02/17 0503  WBC 10.1 8.3 11.3*  NEUTROABS 7.0  --   --   HGB 15.0 12.6 11.1*  HCT 44.8 39.8 34.2*  MCV 95.1 95.2 93.7  PLT 330 280 735   Basic Metabolic Panel:  Recent Labs Lab  03/31/17 1257 04/01/17 0410 04/02/17 0503  NA 138 139 135  K 3.4* 3.4* 3.7  CL 101 103 98*  CO2 27 27 30   GLUCOSE 102* 90 118*  BUN 15 10 9   CREATININE 0.62 0.54 0.62  CALCIUM 8.8* 8.5* 8.1*   GFR: Estimated Creatinine Clearance: 50.9 mL/min (by C-G formula based on SCr of 0.62 mg/dL). Liver Function Tests:  Recent Labs Lab 04/01/17 0410  AST 20  ALT 11*  ALKPHOS 55  BILITOT 0.6  PROT 5.9*  ALBUMIN 2.6*   No results for input(s): LIPASE, AMYLASE in the last 168 hours. No results for input(s): AMMONIA in the last 168 hours. Coagulation Profile:  Recent Labs Lab 03/31/17 1257 04/01/17 0410 04/02/17 0503  INR 1.03 1.10 1.08   Cardiac Enzymes: No results for input(s): CKTOTAL, CKMB, CKMBINDEX, TROPONINI in the last 168 hours. BNP (last 3 results) No results for input(s): PROBNP in the last 8760 hours. HbA1C: No results for input(s): HGBA1C in the last 72 hours. CBG: No results for input(s): GLUCAP in the last 168 hours. Lipid Profile: No results for input(s): CHOL, HDL, LDLCALC, TRIG, CHOLHDL, LDLDIRECT in the last 72 hours. Thyroid Function Tests: No results for input(s): TSH, T4TOTAL, FREET4, T3FREE, THYROIDAB in the last 72 hours. Anemia Panel: No results for input(s): VITAMINB12, FOLATE, FERRITIN, TIBC, IRON, RETICCTPCT in the last 72 hours. Urine analysis:    Component Value Date/Time   COLORURINE YELLOW 04/01/2017 0340   APPEARANCEUR HAZY (A) 04/01/2017 0340   LABSPEC 1.023 04/01/2017 0340   PHURINE 6.0 04/01/2017 0340   GLUCOSEU NEGATIVE 04/01/2017 0340   HGBUR NEGATIVE 04/01/2017 0340   BILIRUBINUR NEGATIVE 04/01/2017 0340   KETONESUR 5 (A) 04/01/2017 0340   PROTEINUR NEGATIVE 04/01/2017 0340   UROBILINOGEN 1.0 01/11/2015 1030   NITRITE NEGATIVE 04/01/2017 0340   LEUKOCYTESUR NEGATIVE 04/01/2017 0340     Radiology Studies: Reviewed images personally in health database    Scheduled Meds: . cholecalciferol  2,000 Units Oral Daily  .  divalproex  375 mg Oral BID  . losartan  50 mg Oral Daily  . potassium chloride SA  40 mEq Oral Once  . sertraline  100 mg Oral Daily  . vitamin B-12  1,000 mcg Oral Daily  . Warfarin - Pharmacist Dosing Inpatient   Does not apply q1800   Continuous Infusions: . sodium chloride 75 mL/hr at 04/01/17 2143  . sodium chloride    . lactated ringers 10 mL/hr at 04/01/17 1024     LOS: 2 days    Time spent: [place], MD [facility] ([facility]   If 7PM-7AM, please contact night-coverage [email or web address] Password [email or web address] 04/02/2017, 10:51 AM

## 2017-04-02 NOTE — Progress Notes (Signed)
ANTICOAGULATION CONSULT NOTE - Follow Up Consult  Pharmacy Consult for Coumadin Indication: VTE prophylaxis s/p orthopedic surgery  Allergies  Allergen Reactions  . Penicillins Swelling and Rash    Has patient had a PCN reaction causing immediate rash, facial/tongue/throat swelling, SOB or lightheadedness with hypotension: No Has patient had a PCN reaction causing severe rash involving mucus membranes or skin necrosis: No Has patient had a PCN reaction that required hospitalization No Has patient had a PCN reaction occurring within the last 10 years: No If all of the above answers are "NO", then may proceed with Cephalosporin use.  . Statins   . Sulfa Drugs Cross Reactors Shortness Of Breath  . Tramadol Hcl Anaphylaxis  . Aspirin Swelling  . Codeine Other (See Comments)    unknown  . Morphine And Related Other (See Comments)    Hypoglycemia    Patient Measurements: Height: 5\' 8"  (172.7 cm) Weight: 143 lb 4.8 oz (65 kg) IBW/kg (Calculated) : 63.9  Vital Signs: Temp: 98.3 F (36.8 C) (10/27 0609) Temp Source: Axillary (10/27 0609) BP: 112/56 (10/27 0609) Pulse Rate: 75 (10/27 0609)  Labs:  Recent Labs  03/31/17 1257 03/31/17 2125 04/01/17 0410 04/02/17 0503  HGB 15.0  --  12.6 11.1*  HCT 44.8  --  39.8 34.2*  PLT 330  --  280 251  APTT  --  30  --   --   LABPROT 13.4  --  14.1 13.9  INR 1.03  --  1.10 1.08  CREATININE 0.62  --  0.54 0.62    Estimated Creatinine Clearance: 50.9 mL/min (by C-G formula based on SCr of 0.62 mg/dL).  Assessment: 83 year old female from [facility] s/p THA on 10/26 and initiated on Coumadin per pharmacy dosing consult for VTE prophylaxis. Patient was not on anticoagulation prior to this admission. Note patient does have a history of a GIB back in June of 2017 thought to be secondary to diverticulitis.   Today, INR is down at 1.08 after initial dose of 2.5mg  yesterday. CBC is stable. No bleeding reported.   Goal of Therapy:  INR  2-3 Monitor platelets by anticoagulation protocol: Yes   Plan:  Repeat Coumadin 2.5mg  po x1 tonight.  Daily PT/INR. If no movement in INR, will increase dose 10/28. Monitor CBC and s/s of bleeding.   Sloan Leiter, PharmD, BCPS, BCCCP Clinical Pharmacist Clinical phone 04/02/2017 until 3:30PM [phone] After hours, please call #[phone] 04/02/2017,11:18 AM

## 2017-04-02 NOTE — Evaluation (Signed)
Occupational Therapy Evaluation Patient Details Name: RUDEAN ICENHOUR MRN: [id number] DOB: 1929-03-07 Today's Date: 04/02/2017    History of Present Illness Patient presents to the hospital with an impacted femoral head fx. She had a hemiarthroplasty done on 04/01/2017. She had an IM nail done 2 months prior on the right hip. She is a high fall risk and has advanced dementia. PMH: HTN, Carcinoma, diverticulitis, breast cancer, fibromyalgia, dementia, arthritis   Clinical Impression   Unsure of pt's PLOF due to pt being a poor historian as a result of advanced dementia. Pt currently requires max +2 assist for functional mobility including bed mobility due to increased RLE pain and posterior lean when sitting unsupported EOB. Pt able to eat and complete grooming tasks with setup provided sitting EOB for ~10 minutes. Utilized classic country music, baby doll, and familiar tasks to encourage ppt to participate in session despite experiencing pain and these strategies appeared to work very well. Pt will benefit from continued acute OT to maximize her independence and safety with functional mobility and ADL and reduce caregiver burden. Recommend SNF for post-acute rehab. OT will continue to follow acutely.    Follow Up Recommendations  SNF;Supervision/Assistance - 24 hour    Equipment Recommendations  Other (comment) (TBD at next venue)    Recommendations for Other Services       Precautions / Restrictions Precautions Precautions: Posterior Hip;Fall Precaution Booklet Issued: No Precaution Comments: Patient could not comprehend percautions 2nd to advanced demntai. She did not break percuations though.  Required Braces or Orthoses: Knee Immobilizer - Left Restrictions Weight Bearing Restrictions: Yes Other Position/Activity Restrictions: WBAT      Mobility Bed Mobility Overal bed mobility: Needs Assistance Bed Mobility: Supine to Sit;Sit to Supine     Supine to sit: Max assist;+2 for  physical assistance Sit to supine: Max assist;+2 for safety/equipment   General bed mobility comments: Max cuing to reach for handrails. Requiered max a to scoor her hips to the edge of the bed. Mod-max a to remain sitting 2nd to posterior push. Patient was able to sit at the edge of the bed for 10 minutes and was able to eat a cracker.   Transfers Overall transfer level: Needs assistance               General transfer comment: Unable to perform despite max cueing and encouragement. Pt declined to attempt to stand 2nd to pain.     Balance Overall balance assessment: Needs assistance Sitting-balance support: Bilateral upper extremity supported;Feet supported Sitting balance-Leahy Scale: Zero Sitting balance - Comments: required mod-max a to reamin seated  Postural control: Posterior lean     Standing balance comment: unable to stand                            ADL either performed or assessed with clinical judgement   ADL Overall ADL's : Needs assistance/impaired Eating/Feeding: Set up;Sitting   Grooming: Wash/dry hands;Set up;Sitting   Upper Body Bathing: Maximal assistance;Sitting Upper Body Bathing Details (indicate cue type and reason): pt with severe posterior lean; assist to maintain sitting position Lower Body Bathing: Maximal assistance;+2 for physical assistance;+2 for safety/equipment;Sit to/from stand   Upper Body Dressing : Maximal assistance;Sitting Upper Body Dressing Details (indicate cue type and reason): pt with severe posterior lean; assist to maintain sitting position Lower Body Dressing: Maximal assistance;+2 for physical assistance;Sit to/from stand  Functional mobility during ADLs: Maximal assistance;+2 for physical assistance General ADL Comments: Utilized music and familiar tasks to encourage mobility and participation in ADL. Pt responded well to these techniques.     Vision   Vision Assessment?: No apparent visual  deficits     Perception     Praxis      Pertinent Vitals/Pain Pain Assessment: Faces Faces Pain Scale: Hurts whole lot Pain Descriptors / Indicators: Aching Pain Intervention(s): Limited activity within patient's tolerance;Monitored during session;Repositioned;Premedicated before session     Hand Dominance Right   Extremity/Trunk Assessment Upper Extremity Assessment Upper Extremity Assessment: Generalized weakness   Lower Extremity Assessment Lower Extremity Assessment: Defer to PT evaluation   Cervical / Trunk Assessment Cervical / Trunk Assessment: Kyphotic   Communication Communication Communication: Other (comment) (Advanced dementia - confused speech)   Cognition Arousal/Alertness: Awake/alert Behavior During Therapy: Anxious Overall Cognitive Status: History of cognitive impairments - at baseline                                 General Comments: Hisdtory of dementia. Able to aswer simple questions. Unreliable historian. Able to follow simple commands    General Comments  Pt taken off supplemental O2 on arrival. Pt SpO2 remained at 95% while conversing with therapist in supine. During transfer to sitting EOB pt desatted to 87% on RA, but returned to 98% sitting EOB for 10 minutes. Pt returned to nasal canula at end of session as sats laying in bed were at 90%. Pt returned to SpO2 95% on 3L of O2 as set prior to session.    Exercises Exercises: General Lower Extremity General Exercises - Lower Extremity Ankle Circles/Pumps: AROM;Right;Left;10 reps;Supine Hip Flexion/Marching: AAROM;Right;5 reps;Supine   Shoulder Instructions      Home Living Family/patient expects to be discharged to:: Skilled nursing facility                                        Prior Functioning/Environment Level of Independence: Needs assistance        Comments: Unable to assess prior level at nursing home 2nd to advanced dementia but note from previous  admission reported min a for gait and transfers        OT Problem List: Decreased strength;Decreased range of motion;Decreased activity tolerance;Impaired balance (sitting and/or standing);Decreased cognition;Decreased safety awareness;Decreased knowledge of use of DME or AE;Decreased knowledge of precautions;Pain      OT Treatment/Interventions: Self-care/ADL training;Therapeutic exercise;DME and/or AE instruction;Therapeutic activities;Patient/family education;Balance training    OT Goals(Current goals can be found in the care plan section) Acute Rehab OT Goals Patient Stated Goal: unable  OT Goal Formulation: With patient Time For Goal Achievement: 04/16/17 Potential to Achieve Goals: Fair ADL Goals Pt Will Perform Eating: with set-up;sitting (EOB for 10 minutes with no physical assist) Pt Will Perform Grooming: with set-up;sitting (EOB for 10 minutes with cueing for sequencing) Pt Will Perform Upper Body Bathing: with set-up;sitting (EOB with cueing for sequencing as needed) Pt Will Perform Upper Body Dressing: sitting;with set-up (EOB with cueing as needed for sequencing) Pt Will Transfer to Toilet: with mod assist;stand pivot transfer;bedside commode Pt Will Perform Toileting - Clothing Manipulation and hygiene: with mod assist;sit to/from stand Additional ADL Goal #1: Pt will complete bed mobility with mod assist and caregivers adhering to hip precautions.  OT Frequency: Min 2X/week  Barriers to D/C:            Co-evaluation PT/OT/SLP Co-Evaluation/Treatment: Yes Reason for Co-Treatment: Complexity of the patient's impairments (multi-system involvement);Necessary to address cognition/behavior during functional activity;For patient/therapist safety;To address functional/ADL transfers PT goals addressed during session: Mobility/safety with mobility;Balance;Proper use of DME;Strengthening/ROM OT goals addressed during session: ADL's and self-care;Proper use of Adaptive  equipment and DME;Strengthening/ROM      AM-PAC PT "6 Clicks" Daily Activity     Outcome Measure Help from another person eating meals?: None Help from another person taking care of personal grooming?: A Little Help from another person toileting, which includes using toliet, bedpan, or urinal?: A Lot Help from another person bathing (including washing, rinsing, drying)?: Total Help from another person to put on and taking off regular upper body clothing?: A Little Help from another person to put on and taking off regular lower body clothing?: A Lot 6 Click Score: 15   End of Session Equipment Utilized During Treatment: Rolling Salts;Oxygen Nurse Communication: Mobility status;Weight bearing status;Precautions  Activity Tolerance: Patient limited by pain Patient left: in bed;with call bell/phone within reach;with bed alarm set;with SCD's reapplied  OT Visit Diagnosis: Other abnormalities of gait and mobility (R26.89);History of falling (Z91.81);Muscle weakness (generalized) (M62.81);Other symptoms and signs involving cognitive function;Pain Pain - Right/Left: Left Pain - part of body: Leg                Time: 7544-9201 OT Time Calculation (min): 26 min Charges:  OT General Charges $OT Visit: 1 Visit OT Evaluation $OT Eval High Complexity: 1 High G-Codes:     Redmond Baseman, MS, OTR/L 04/02/2017, 12:18 PM

## 2017-04-02 NOTE — Plan of Care (Signed)
Problem: Pain Managment: Goal: General experience of comfort will improve Outcome: Progressing Medicated once for pain with moderate relief  Problem: Bowel/Gastric: Goal: Will not experience complications related to bowel motility Outcome: Progressing No bowel complications reported

## 2017-04-02 NOTE — Progress Notes (Signed)
Pt suffers from dementia and is unable to clearly state wether or not she wants to utilize CPAP QHS. RT will hold CPAP at this time.  Pt currently on 2 LPM  and tolerating well at this time.  RT to monitor and assess as needed.

## 2017-04-03 ENCOUNTER — Encounter (HOSPITAL_COMMUNITY): Payer: Self-pay | Admitting: *Deleted

## 2017-04-03 LAB — BASIC METABOLIC PANEL
Anion gap: 7 (ref 5–15)
BUN: 11 mg/dL (ref 6–20)
CO2: 28 mmol/L (ref 22–32)
CREATININE: 0.52 mg/dL (ref 0.44–1.00)
Calcium: 8 mg/dL — ABNORMAL LOW (ref 8.9–10.3)
Chloride: 102 mmol/L (ref 101–111)
GFR calc Af Amer: >60 mL/min (ref >60)
GLUCOSE: 100 mg/dL — AB (ref 65–99)
Potassium: 3.3 mmol/L — ABNORMAL LOW (ref 3.5–5.1)
Sodium: 137 mmol/L (ref 135–145)

## 2017-04-03 LAB — CBC
HEMATOCRIT: 32.3 % — AB (ref 36.0–46.0)
Hemoglobin: 10.6 g/dL — ABNORMAL LOW (ref 12.0–15.0)
MCH: 31.4 pg (ref 26.0–34.0)
MCHC: 32.8 g/dL (ref 30.0–36.0)
MCV: 95.6 fL (ref 78.0–100.0)
PLATELETS: 201 10*3/uL (ref 150–400)
RBC: 3.38 MIL/uL — ABNORMAL LOW (ref 3.87–5.11)
RDW: 13.7 % (ref 11.5–15.5)
WBC: 9.9 10*3/uL (ref 4.0–10.5)

## 2017-04-03 LAB — PROTIME-INR
INR: 1.16
Prothrombin Time: 14.7 seconds (ref 11.4–15.2)

## 2017-04-03 MED ORDER — LORAZEPAM 0.5 MG PO TABS: 0.5000 mg | ORAL_TABLET | Freq: Three times a day (TID) | ORAL | 0 refills | Status: AC | PRN

## 2017-04-03 MED ORDER — WARFARIN SODIUM 2.5 MG PO TABS: 2.5000 mg | ORAL_TABLET | Freq: Once | ORAL | 0 refills | Status: AC

## 2017-04-03 MED ORDER — SENNOSIDES-DOCUSATE SODIUM 8.6-50 MG PO TABS: 1.0000 | ORAL_TABLET | Freq: Every evening | ORAL | Status: AC | PRN

## 2017-04-03 MED ORDER — WARFARIN SODIUM 5 MG PO TABS: 2.5000 mg | ORAL_TABLET | Freq: Once | ORAL | Status: DC

## 2017-04-03 MED ORDER — HYDROCODONE-ACETAMINOPHEN 5-325 MG PO TABS: 1.0000 | ORAL_TABLET | Freq: Four times a day (QID) | ORAL | 0 refills | Status: AC | PRN

## 2017-04-03 MED ORDER — WARFARIN SODIUM 4 MG PO TABS
4.0000 mg | ORAL_TABLET | Freq: Once | ORAL | Status: DC
  Filled 2017-04-03: qty 1

## 2017-04-03 NOTE — Discharge Summary (Signed)
Physician Discharge Summary  Kimberly Acevedo DJS:[id number] DOB: 06-Jul-1928 DOA: 03/31/2017  PCP: Deland Pretty, MD  Admit date: 03/31/2017 Discharge date: 04/03/2017  Time spent: 25 minutes  Recommendations for Outpatient Follow-up:  1. Patient requires posterior hip precautions, continue aspirin 81 mg for DVT prophylaxis--keep knee immobilizer on for the time being until reviewed by surgeon--- will need 2-week follow-up with Dr. Sharol Given as an outpatient 2. Reorient daily-she has mild to moderate dementia and is pleasantly confused, scripts given for controlled substances Depakote, Ativan, Percocet for pain control 3. Get CBC plus differential as well as basic metabolic panel in 1 week 4. If failing to thrive would recommend outpatient discussion with palliative care given dementia and hip fracture high risk for decline in this patient population  Discharge Diagnoses:  Active Problems:   Hypertension, essential, benign   Fibromyalgia   Achalasia, esophageal   Invasive lobular carcinoma of breast, stage 1 (HCC)   Depression   Dementia with behavioral disturbance   GERD (gastroesophageal reflux disease)   Dementia   OSA on CPAP   S/p left hip fracture   Left displaced femoral neck fracture ([place])   Discharge Condition: Fair  Diet recommendation: Recommend liberalization of diet  Filed Weights   04/01/17 1020 04/02/17 0646 04/03/17 0500  Weight: 64.9 kg (143 lb) 65 kg (143 lb 4.8 oz) 66.2 kg (146 lb)    History of present illness: 83 year old female resident of [facility] skilled nursing center History of right hip fracture 10/2016 HTN Inner left breast mass status post lumpectomy 07/04/12-Dr. Harlow Asa Prior lower GI bleed secondary to possible diverticulitis/diverticulosis 11/2015             Prior hemorrhoidopexy Dr. Marcello Moores 7/16 Mild to moderate dementia confounding with depression Reflux Obstructive sleep apnea supposed to be on CPAP Hyperglycemia on last admission 10/2016 A1c  was 6.3 Questionable allergies to narcotics    readmitted from skilled nursing facility with unwitnessed fall found to have left hip fracture Orthopedics Dr. Sharol Given consulted regarding management   Hospital Course:    Left hip fracture             Status post hemiarthroplasty 10/26             Did fairly well with surgery 10/2016 do not feel need any further clearance from cardiology at this time            Weightbearing as tolerated posterior hip precautions knee immobilizer aspirin for DVT prophylaxis 81 mg  Use Tylenol 650 3 times daily to 4 times daily for first choice for pain, if not relieved sparingly use Percocet as she has some confusion overall has improved             Will discharge to skilled therapy   Prior lower GI bleeding thought to be diverticular             Continue PPI and antireflux measures            Needs aspirin 81 mg at least for the time being for DVT prophylaxis follow hemoglobin    dementia/depression             Continue sertraline 50 daily--she is pleasantly confused             Previously was on Depakote 375 twice daily presumably for mood stabilization             Would continue Ativan 0.5 3 times daily as needed anxiety/confusion if needed   Hypertension  Continue Cozaar 50 mg daily   Probable osteoporosis             Given age as well as multiple hip fractures, empirically treat with vitamin D suggest outpatient further workup              Hemorrhoids             If needed use Anusol suppositories  Prescription for MiraLAX as well as senna provided given history of hemorrhoids and will be on opiates  Procedures:  Left hemiarthroplasty 04/01/2017  Consultations:  Orthopedics Dr. Sharol Given  Discharge Exam: Vitals:   04/02/17 1929 04/03/17 0504  BP: (!) 119/42 (!) 139/58  Pulse: 81 83  Resp: 15 17  Temp: 97.6 F (36.4 C) 98.1 F (36.7 C)  SpO2: 93% 95%    General: Alert but confused, pleasant otherwise holding baby doll in her  hand--cannot obtain review of systems but is able to move leg Half eaten cup of ice cream at the bedside Cardiovascular: S1-S2 no murmur rub or gallop Respiratory: Clinically clear no added sound Abdomen soft Knee immobilizer on  Discharge Instructions   Discharge Instructions    Posterior total hip precautions    Complete by:  As directed    Weight bearing as tolerated    Complete by:  As directed      Current Discharge Medication List    START taking these medications   Details  aspirin EC 81 MG tablet Take 1 tablet (81 mg total) by mouth daily. Qty: 30 tablet, Refills: 1    HYDROcodone-acetaminophen (NORCO/VICODIN) 5-325 MG tablet Take 1 tablet by mouth every 6 (six) hours as needed for moderate pain. Qty: 6 tablet, Refills: 0    senna-docusate (SENOKOT-S) 8.6-50 MG tablet Take 1 tablet by mouth at bedtime as needed for mild constipation.    warfarin (COUMADIN) 2.5 MG tablet Take 1 tablet (2.5 mg total) by mouth one time only at 6 PM. Qty: 5 tablet, Refills: 0      CONTINUE these medications which have CHANGED   Details  acetaminophen (TYLENOL) 500 MG tablet Take 1 tablet (500 mg total) by mouth every 6 (six) hours as needed for mild pain. Qty: 30 tablet, Refills: 0    LORazepam (ATIVAN) 0.5 MG tablet Take 1 tablet (0.5 mg total) by mouth 3 (three) times daily as needed for anxiety. Qty: 6 tablet, Refills: 0      CONTINUE these medications which have NOT CHANGED   Details  bisacodyl (DULCOLAX) 5 MG EC tablet Take 1 tablet (5 mg total) by mouth daily as needed for moderate constipation. Qty: 30 tablet, Refills: 0    Cholecalciferol (VITAMIN D) 2000 UNITS CAPS Take 2,000 Units by mouth daily.     divalproex (DEPAKOTE SPRINKLE) 125 MG capsule Take 375 mg by mouth 2 (two) times daily.    !! hydrocortisone (ANUSOL-HC) 2.5 % rectal cream Place 1 application rectally 3 (three) times daily as needed for hemorrhoids or anal itching.    !! hydrocortisone 2.5 % cream  Apply 1 application topically 3 (three) times daily as needed. Apply a thin layer to the affected area (s) by topical route    Infant Care Products (DERMACLOUD EX) Apply 1 application topically 3 (three) times daily. Ointment Apply Topically to rash with brief changes and incontience episodes    loperamide (IMODIUM A-D) 2 MG tablet Take 2 mg by mouth as needed for diarrhea or loose stools.    losartan (COZAAR) 50 MG tablet Take 1  tablet (50 mg total) by mouth daily. Qty: 30 tablet, Refills: 1    polyethylene glycol (MIRALAX / GLYCOLAX) packet Take 17 g by mouth daily as needed for mild constipation. Qty: 14 each, Refills: 0    sertraline (ZOLOFT) 50 MG tablet Take 50 mg by mouth daily.  Refills: 5    vitamin B-12 (CYANOCOBALAMIN) 1000 MCG tablet Take 1,000 mcg by mouth every other day.      !! - Potential duplicate medications found. Please discuss with provider.    STOP taking these medications     ibuprofen (ADVIL,MOTRIN) 600 MG tablet      aspirin 325 MG tablet        Allergies  Allergen Reactions  . Penicillins Swelling and Rash    Has patient had a PCN reaction causing immediate rash, facial/tongue/throat swelling, SOB or lightheadedness with hypotension: No Has patient had a PCN reaction causing severe rash involving mucus membranes or skin necrosis: No Has patient had a PCN reaction that required hospitalization No Has patient had a PCN reaction occurring within the last 10 years: No If all of the above answers are "NO", then may proceed with Cephalosporin use.  . Statins   . Sulfa Drugs Cross Reactors Shortness Of Breath  . Tramadol Hcl Anaphylaxis  . Aspirin Swelling  . Codeine Other (See Comments)    unknown  . Morphine And Related Other (See Comments)    Hypoglycemia   Follow-up Information    [name], MD In 1 week.   Specialty:  Orthopedic Surgery Contact information: [place] [address] [phone]             The results of significant diagnostics from this hospitalization (including imaging, microbiology, ancillary and laboratory) are listed below for reference.    Significant Diagnostic Studies: Chest Portable 1 View  Result Date: 03/31/2017 CLINICAL DATA:  Preop chest exam for hip arthroplasty due to fracture. History of breast cancer. EXAM: PORTABLE CHEST 1 VIEW COMPARISON:  11/21/2016.  12/07/2016. FINDINGS: The heart size and mediastinal contours are within normal limits. Both lungs are clear. The visualized skeletal structures are unremarkable. Aortic atherosclerosis. IMPRESSION: No active disease.  Stable exam. Electronically Signed   By: Staci Righter M.D.   On: 03/31/2017 15:01   Xr Hip Unilat W Or W/o Pelvis 2-3 Views Left  Result Date: 03/31/2017 2 view radiographs of the left hip shows a displaced impacted femoral neck fracture with displacement.  Xr Hip Unilat W Or W/o Pelvis 2-3 Views Right  Result Date: 03/31/2017 2 view radiographs of the right hip shows stable internal fixation of the intertrochanteric hip fracture.  No complicating features no hardware failure.   Microbiology: Recent Results (from the past 240 hour(s))  MRSA PCR Screening     Status: None   Collection Time: 04/01/17  2:57 AM  Result Value Ref Range Status   MRSA by PCR NEGATIVE NEGATIVE Final    Comment:        The GeneXpert MRSA Assay (FDA approved for NASAL specimens only), is one component of a comprehensive MRSA colonization surveillance program. It is not intended to diagnose MRSA infection nor to guide or monitor treatment for MRSA infections.      Labs: Basic Metabolic Panel:  Recent Labs Lab 03/31/17 1257 04/01/17 0410 04/02/17 0503 04/03/17 0255  NA 138 139 135 137  K 3.4* 3.4* 3.7 3.3*  CL 101 103 98* 102  CO2 27 27 30 28   GLUCOSE 102* 90 118*  100*  BUN 15 10 9 11   CREATININE 0.62 0.54 0.62 0.52  CALCIUM 8.8* 8.5* 8.1* 8.0*   Liver Function Tests:  Recent  Labs Lab 04/01/17 0410  AST 20  ALT 11*  ALKPHOS 55  BILITOT 0.6  PROT 5.9*  ALBUMIN 2.6*   No results for input(s): LIPASE, AMYLASE in the last 168 hours. No results for input(s): AMMONIA in the last 168 hours. CBC:  Recent Labs Lab 03/31/17 1257 04/01/17 0410 04/02/17 0503 04/03/17 0255  WBC 10.1 8.3 11.3* 9.9  NEUTROABS 7.0  --   --   --   HGB 15.0 12.6 11.1* 10.6*  HCT 44.8 39.8 34.2* 32.3*  MCV 95.1 95.2 93.7 95.6  PLT 330 280 251 201   Cardiac Enzymes: No results for input(s): CKTOTAL, CKMB, CKMBINDEX, TROPONINI in the last 168 hours. BNP: BNP (last 3 results) No results for input(s): BNP in the last 8760 hours.  ProBNP (last 3 results) No results for input(s): PROBNP in the last 8760 hours.  CBG: No results for input(s): GLUCAP in the last 168 hours.     SignedNita Sells MD   [facility] 04/03/2017, 10:07 AM

## 2017-04-03 NOTE — Progress Notes (Signed)
CSW following to facilitate discharge planning. CSW sent updated clinical information and discharge summary to patient's [name], and was contacted by the regional nurse, Mickel Baas, that they have concerns about the patient returning that they will not be able to handle until tomorrow. Mickel Baas requested that CSW contact patient's daughter to discuss SNF rehab again, as that is usually what happens after hip replacements.   CSW contacted patient's daughter to discuss [name] nurse concerns, but patient's daughter continued to request that patient return [facility] instead of SNF. Patient's daughter requested that CSW contact MD to inform them that the facility can't take her until tomorrow.   CSW contacted Mickel Baas back to discuss that patient's daughter still wants to return to [facility]. Mickel Baas discussed how there was no RN on site at the [name] today (she is working two and a half hours away on call), and wouldn't be until she arrives in town tomorrow; there's no one available to train aide staff on how to assist the patient with transferring safely and no way to get any equipment for the patient in terms of a bedside commode or any other adaptive equipment that may be needed to accommodate the patient at this time. Mickel Baas indicated that she will be coming into town tomorrow and will be able to set up what is needed to safely get the patient back to the facility.   CSW alerted MD of information. CSW will continue to follow to facilitate discharge to [name] tomorrow.  Laveda Abbe, [place] Clinical Social Worker [phone]

## 2017-04-03 NOTE — Progress Notes (Signed)
ANTICOAGULATION CONSULT NOTE - Follow Up Consult  Pharmacy Consult for Coumadin Indication: VTE prophylaxis s/p orthopedic surgery  Allergies  Allergen Reactions  . Penicillins Swelling and Rash    Has patient had a PCN reaction causing immediate rash, facial/tongue/throat swelling, SOB or lightheadedness with hypotension: No Has patient had a PCN reaction causing severe rash involving mucus membranes or skin necrosis: No Has patient had a PCN reaction that required hospitalization No Has patient had a PCN reaction occurring within the last 10 years: No If all of the above answers are "NO", then may proceed with Cephalosporin use.  . Statins   . Sulfa Drugs Cross Reactors Shortness Of Breath  . Tramadol Hcl Anaphylaxis  . Aspirin Swelling  . Codeine Other (See Comments)    unknown  . Morphine And Related Other (See Comments)    Hypoglycemia    Patient Measurements: Height: 5\' 8"  (172.7 cm) Weight: 146 lb (66.2 kg) IBW/kg (Calculated) : 63.9  Vital Signs: Temp: 97.4 F (36.3 C) (10/28 1447) Temp Source: Oral (10/28 1447) BP: 130/43 (10/28 1447) Pulse Rate: 75 (10/28 1447)  Labs:  Recent Labs  03/31/17 2125  04/01/17 0410 04/02/17 0503 04/03/17 0255  HGB  --   < > 12.6 11.1* 10.6*  HCT  --   --  39.8 34.2* 32.3*  PLT  --   --  280 251 201  APTT 30  --   --   --   --   LABPROT  --   --  14.1 13.9 14.7  INR  --   --  1.10 1.08 1.16  CREATININE  --   --  0.54 0.62 0.52  < > = values in this interval not displayed.  Estimated Creatinine Clearance: 50.9 mL/min (by C-G formula based on SCr of 0.52 mg/dL).  Assessment: 83 year old female from [facility] s/p THA on 10/26 and initiated on Coumadin per pharmacy dosing consult for VTE prophylaxis. Patient was not on anticoagulation prior to this admission. Note patient does have a history of a GIB back in June of 2017 thought to be secondary to diverticulitis.   Today, INR is 1.16. CBC stable. Warfarin dose was not  administered on 10/27 per MAR. Planning to d/c on 10/29.   Goal of Therapy:  INR 2-3 Monitor platelets by anticoagulation protocol: Yes   Plan:  1. Warfarin 4 mg x 1 this evening  2. Daily PT/INR 3. Monitor CBC and s/s of bleeding  Vincenza Hews, PharmD, BCPS 04/03/2017, 4:38 PM

## 2017-04-04 ENCOUNTER — Encounter (HOSPITAL_COMMUNITY): Payer: Self-pay | Admitting: Orthopedic Surgery

## 2017-04-04 DIAGNOSIS — K220 Achalasia of cardia: Secondary | ICD-10-CM

## 2017-04-04 DIAGNOSIS — F0280 Dementia in other diseases classified elsewhere without behavioral disturbance: Secondary | ICD-10-CM

## 2017-04-04 DIAGNOSIS — G309 Alzheimer's disease, unspecified: Secondary | ICD-10-CM

## 2017-04-04 LAB — CBC WITH DIFFERENTIAL/PLATELET
BASOS PCT: 0 %
Basophils Absolute: 0 10*3/uL (ref 0.0–0.1)
EOS ABS: 0.1 10*3/uL (ref 0.0–0.7)
Eosinophils Relative: 1 %
HEMATOCRIT: 33.6 % — AB (ref 36.0–46.0)
HEMOGLOBIN: 10.8 g/dL — AB (ref 12.0–15.0)
Lymphocytes Relative: 29 %
Lymphs Abs: 2.2 10*3/uL (ref 0.7–4.0)
MCH: 30.8 pg (ref 26.0–34.0)
MCHC: 32.1 g/dL (ref 30.0–36.0)
MCV: 95.7 fL (ref 78.0–100.0)
Monocytes Absolute: 0.5 10*3/uL (ref 0.1–1.0)
Monocytes Relative: 7 %
Neutro Abs: 4.9 10*3/uL (ref 1.7–7.7)
Neutrophils Relative %: 63 %
Platelets: 196 10*3/uL (ref 150–400)
RBC: 3.51 MIL/uL — AB (ref 3.87–5.11)
RDW: 13.7 % (ref 11.5–15.5)
WBC: 7.7 10*3/uL (ref 4.0–10.5)

## 2017-04-04 LAB — PROTIME-INR
INR: 1.08
PROTHROMBIN TIME: 13.9 s (ref 11.4–15.2)

## 2017-04-04 MED ORDER — WARFARIN SODIUM 4 MG PO TABS
4.0000 mg | ORAL_TABLET | Freq: Once | ORAL | Status: AC
  Administered 2017-04-04: 4 mg via ORAL
  Filled 2017-04-04 (×2): qty 1

## 2017-04-04 NOTE — Progress Notes (Signed)
Pt discharged to [name] in stable condition, All pt belongings and RX's x 2 given to transporters. Report called to [place].  AKingRNBSN

## 2017-04-04 NOTE — Progress Notes (Signed)
ANTICOAGULATION CONSULT NOTE - Follow Up Consult  Pharmacy Consult for Coumadin Indication: VTE prophylaxis s/p orthopedic surgery  Allergies  Allergen Reactions  . Penicillins Swelling and Rash    Has patient had a PCN reaction causing immediate rash, facial/tongue/throat swelling, SOB or lightheadedness with hypotension: No Has patient had a PCN reaction causing severe rash involving mucus membranes or skin necrosis: No Has patient had a PCN reaction that required hospitalization No Has patient had a PCN reaction occurring within the last 10 years: No If all of the above answers are "NO", then may proceed with Cephalosporin use.  . Statins   . Sulfa Drugs Cross Reactors Shortness Of Breath  . Tramadol Hcl Anaphylaxis  . Aspirin Swelling  . Codeine Other (See Comments)    unknown  . Morphine And Related Other (See Comments)    Hypoglycemia    Patient Measurements: Height: 5\' 8"  (172.7 cm) Weight: 149 lb 1.6 oz (67.6 kg) IBW/kg (Calculated) : 63.9  Vital Signs: Temp: 97.7 F (36.5 C) (10/29 0739) Temp Source: Oral (10/29 0739) BP: 135/56 (10/29 0739) Pulse Rate: 79 (10/29 0739)  Labs:  Recent Labs  04/02/17 0503 04/03/17 0255 04/04/17 0344 04/04/17 0705  HGB 11.1* 10.6*  --  10.8*  HCT 34.2* 32.3*  --  33.6*  PLT 251 201  --  196  LABPROT 13.9 14.7 13.9  --   INR 1.08 1.16 1.08  --   CREATININE 0.62 0.52  --   --     Estimated Creatinine Clearance: 50.9 mL/min (by C-G formula based on SCr of 0.52 mg/dL).  Assessment: 60 YOF from [facility] s/p THA on 10/26 and initiated on Coumadin for VTE prophylaxis.  Noted that patient has a history of GIB back in June of 2017 thought to be secondary to diverticulitis.   INR remains sub-therapeutic at 1.08.  Coumadin dose not given on 04/02/17 and not charted as given on 04/03/17.  No bleeding reported.    Goal of Therapy:  INR 2-3 Monitor platelets by anticoagulation protocol: Yes    Plan:  Coumadin 4mg  PO  today if not discharged Daily PT / INR   Armani Brar D. Mina Marble, PharmD, BCPS Pager:  [phone] 04/04/2017, 10:16 AM

## 2017-04-04 NOTE — Discharge Summary (Signed)
Physician Discharge Summary  Kimberly Acevedo DGU:[id number] DOB: 1929-01-11 DOA: 03/31/2017 Stable for d/c today 10/29--no changes from prior assessment 10/28 PCP: Deland Pretty, MD  Admit date: 03/31/2017 Discharge date: 04/04/2017  Time spent: 25 minutes  Recommendations for Outpatient Follow-up:  1. Patient requires posterior hip precautions, continue aspirin 81 mg for DVT prophylaxis--keep knee immobilizer on for the time being until reviewed by surgeon--- will need 2-week follow-up with Dr. Sharol Given as an outpatient 2. Reorient daily-she has mild to moderate dementia and is pleasantly confused, scripts given for controlled substances Depakote, Ativan, Percocet for pain control 3. Get CBC plus differential as well as basic metabolic panel in 1 week 4. If failing to thrive would recommend outpatient discussion with palliative care given dementia and hip fracture high risk for decline in this patient population  Discharge Diagnoses:  Active Problems:   Hypertension, essential, benign   Fibromyalgia   Achalasia, esophageal   Invasive lobular carcinoma of breast, stage 1 (HCC)   Depression   Dementia with behavioral disturbance   GERD (gastroesophageal reflux disease)   Dementia   OSA on CPAP   S/p left hip fracture   Left displaced femoral neck fracture (HCC)   Discharge Condition: Fair  Diet recommendation: Recommend liberalization of diet  Filed Weights   04/02/17 0646 04/03/17 0500 04/04/17 0500  Weight: 65 kg (143 lb 4.8 oz) 66.2 kg (146 lb) 67.6 kg (149 lb 1.6 oz)    History of present illness: 83 year old female resident of [facility] skilled nursing center History of right hip fracture 10/2016 HTN Inner left breast mass status post lumpectomy 07/04/12-Dr. Harlow Asa Prior lower GI bleed secondary to possible diverticulitis/diverticulosis 11/2015             Prior hemorrhoidopexy Dr. Marcello Moores 7/16 Mild to moderate dementia confounding with depression Reflux Obstructive sleep  apnea supposed to be on CPAP Hyperglycemia on last admission 10/2016 A1c was 6.3 Questionable allergies to narcotics    readmitted from skilled nursing facility with unwitnessed fall found to have left hip fracture Orthopedics Dr. Sharol Given consulted regarding management   Hospital Course:    Left hip fracture             Status post hemiarthroplasty 10/26             Did fairly well with surgery 10/2016 do not feel need any further clearance from cardiology at this time            Weightbearing as tolerated posterior hip precautions knee immobilizer aspirin for DVT prophylaxis 81 mg  Use Tylenol 650 3 times daily to 4 times daily for first choice for pain, if not relieved sparingly use Percocet as she has some confusion overall has improved             Will discharge to skilled therapy   Prior lower GI bleeding thought to be diverticular             Continue PPI and antireflux measures            Needs aspirin 81 mg at least for the time being for DVT prophylaxis follow hemoglobin    dementia/depression             Continue sertraline 50 daily--she is pleasantly confused             Previously was on Depakote 375 twice daily presumably for mood stabilization             Would continue Ativan 0.5 3  times daily as needed anxiety/confusion if needed   Hypertension             Continue Cozaar 50 mg daily   Probable osteoporosis             Given age as well as multiple hip fractures, empirically treat with vitamin D suggest outpatient further workup              Hemorrhoids             If needed use Anusol suppositories  Prescription for MiraLAX as well as senna provided given history of hemorrhoids and will be on opiates  Procedures:  Left hemiarthroplasty 04/01/2017  Consultations:  Orthopedics Dr. Sharol Given  Discharge Exam: Vitals:   04/03/17 1956 04/04/17 0739  BP: 135/63 (!) 135/56  Pulse: 91 79  Resp:    Temp: 98.4 F (36.9 C) 97.7 F (36.5 C)  SpO2: 97% 98%    General:  Alert but confused, pleasant otherwise holding baby doll in her hand--cannot obtain review of systems but is able to move leg Half eaten cup of ice cream at the bedside Cardiovascular: S1-S2 no murmur rub or gallop Respiratory: Clinically clear no added sound Abdomen soft Knee immobilizer on  Discharge Instructions   Discharge Instructions    Diet - low sodium heart healthy    Complete by:  As directed    Increase activity slowly    Complete by:  As directed    Posterior total hip precautions    Complete by:  As directed    Weight bearing as tolerated    Complete by:  As directed      Current Discharge Medication List    START taking these medications   Details  aspirin EC 81 MG tablet Take 1 tablet (81 mg total) by mouth daily. Qty: 30 tablet, Refills: 1    HYDROcodone-acetaminophen (NORCO/VICODIN) 5-325 MG tablet Take 1 tablet by mouth every 6 (six) hours as needed for moderate pain. Qty: 6 tablet, Refills: 0    senna-docusate (SENOKOT-S) 8.6-50 MG tablet Take 1 tablet by mouth at bedtime as needed for mild constipation.    warfarin (COUMADIN) 2.5 MG tablet Take 1 tablet (2.5 mg total) by mouth one time only at 6 PM. Qty: 5 tablet, Refills: 0      CONTINUE these medications which have CHANGED   Details  acetaminophen (TYLENOL) 500 MG tablet Take 1 tablet (500 mg total) by mouth every 6 (six) hours as needed for mild pain. Qty: 30 tablet, Refills: 0    LORazepam (ATIVAN) 0.5 MG tablet Take 1 tablet (0.5 mg total) by mouth 3 (three) times daily as needed for anxiety. Qty: 6 tablet, Refills: 0      CONTINUE these medications which have NOT CHANGED   Details  bisacodyl (DULCOLAX) 5 MG EC tablet Take 1 tablet (5 mg total) by mouth daily as needed for moderate constipation. Qty: 30 tablet, Refills: 0    Cholecalciferol (VITAMIN D) 2000 UNITS CAPS Take 2,000 Units by mouth daily.     divalproex (DEPAKOTE SPRINKLE) 125 MG capsule Take 375 mg by mouth 2 (two) times  daily.    !! hydrocortisone (ANUSOL-HC) 2.5 % rectal cream Place 1 application rectally 3 (three) times daily as needed for hemorrhoids or anal itching.    !! hydrocortisone 2.5 % cream Apply 1 application topically 3 (three) times daily as needed. Apply a thin layer to the affected area (s) by topical route    Infant Care Products (  DERMACLOUD EX) Apply 1 application topically 3 (three) times daily. Ointment Apply Topically to rash with brief changes and incontience episodes    loperamide (IMODIUM A-D) 2 MG tablet Take 2 mg by mouth as needed for diarrhea or loose stools.    losartan (COZAAR) 50 MG tablet Take 1 tablet (50 mg total) by mouth daily. Qty: 30 tablet, Refills: 1    polyethylene glycol (MIRALAX / GLYCOLAX) packet Take 17 g by mouth daily as needed for mild constipation. Qty: 14 each, Refills: 0    sertraline (ZOLOFT) 50 MG tablet Take 50 mg by mouth daily.  Refills: 5    vitamin B-12 (CYANOCOBALAMIN) 1000 MCG tablet Take 1,000 mcg by mouth every other day.      !! - Potential duplicate medications found. Please discuss with provider.    STOP taking these medications     ibuprofen (ADVIL,MOTRIN) 600 MG tablet      aspirin 325 MG tablet        Allergies  Allergen Reactions  . Penicillins Swelling and Rash    Has patient had a PCN reaction causing immediate rash, facial/tongue/throat swelling, SOB or lightheadedness with hypotension: No Has patient had a PCN reaction causing severe rash involving mucus membranes or skin necrosis: No Has patient had a PCN reaction that required hospitalization No Has patient had a PCN reaction occurring within the last 10 years: No If all of the above answers are "NO", then may proceed with Cephalosporin use.  . Statins   . Sulfa Drugs Cross Reactors Shortness Of Breath  . Tramadol Hcl Anaphylaxis  . Aspirin Swelling  . Codeine Other (See Comments)    unknown  . Morphine And Related Other (See Comments)    Hypoglycemia    Follow-up Information    [name], MD In 1 week.   Specialty:  Orthopedic Surgery Contact information: [place] [address] [phone]            The results of significant diagnostics from this hospitalization (including imaging, microbiology, ancillary and laboratory) are listed below for reference.    Significant Diagnostic Studies: Chest Portable 1 View  Result Date: 03/31/2017 CLINICAL DATA:  Preop chest exam for hip arthroplasty due to fracture. History of breast cancer. EXAM: PORTABLE CHEST 1 VIEW COMPARISON:  11/21/2016.  12/07/2016. FINDINGS: The heart size and mediastinal contours are within normal limits. Both lungs are clear. The visualized skeletal structures are unremarkable. Aortic atherosclerosis. IMPRESSION: No active disease.  Stable exam. Electronically Signed   By: Staci Righter M.D.   On: 03/31/2017 15:01   Xr Hip Unilat W Or W/o Pelvis 2-3 Views Left  Result Date: 03/31/2017 2 view radiographs of the left hip shows a displaced impacted femoral neck fracture with displacement.  Xr Hip Unilat W Or W/o Pelvis 2-3 Views Right  Result Date: 03/31/2017 2 view radiographs of the right hip shows stable internal fixation of the intertrochanteric hip fracture.  No complicating features no hardware failure.   Microbiology: Recent Results (from the past 240 hour(s))  MRSA PCR Screening     Status: None   Collection Time: 04/01/17  2:57 AM  Result Value Ref Range Status   MRSA by PCR NEGATIVE NEGATIVE Final    Comment:        The GeneXpert MRSA Assay (FDA approved for NASAL specimens only), is one component of a comprehensive MRSA colonization surveillance program. It is not intended to diagnose MRSA infection nor to guide or monitor treatment for  MRSA infections.      Labs: Basic Metabolic Panel:  Recent Labs Lab 03/31/17 1257 04/01/17 0410 04/02/17 0503 04/03/17 0255  NA 138 139 135 137  K 3.4* 3.4* 3.7  3.3*  CL 101 103 98* 102  CO2 27 27 30 28   GLUCOSE 102* 90 118* 100*  BUN 15 10 9 11   CREATININE 0.62 0.54 0.62 0.52  CALCIUM 8.8* 8.5* 8.1* 8.0*   Liver Function Tests:  Recent Labs Lab 04/01/17 0410  AST 20  ALT 11*  ALKPHOS 55  BILITOT 0.6  PROT 5.9*  ALBUMIN 2.6*   No results for input(s): LIPASE, AMYLASE in the last 168 hours. No results for input(s): AMMONIA in the last 168 hours. CBC:  Recent Labs Lab 03/31/17 1257 04/01/17 0410 04/02/17 0503 04/03/17 0255 04/04/17 0705  WBC 10.1 8.3 11.3* 9.9 7.7  NEUTROABS 7.0  --   --   --  4.9  HGB 15.0 12.6 11.1* 10.6* 10.8*  HCT 44.8 39.8 34.2* 32.3* 33.6*  MCV 95.1 95.2 93.7 95.6 95.7  PLT 330 280 251 201 196   Cardiac Enzymes: No results for input(s): CKTOTAL, CKMB, CKMBINDEX, TROPONINI in the last 168 hours. BNP: BNP (last 3 results) No results for input(s): BNP in the last 8760 hours.  ProBNP (last 3 results) No results for input(s): PROBNP in the last 8760 hours.  CBG: No results for input(s): GLUCAP in the last 168 hours.     SignedNita Sells MD   [facility] 04/04/2017, 9:17 AM

## 2017-04-04 NOTE — Clinical Social Work Placement (Signed)
   CLINICAL SOCIAL WORK PLACEMENT  NOTE  Date:  04/04/2017  Patient Details  Name: Kimberly Acevedo MRN: [id number] Date of Birth: 1929/01/30  Clinical Social Work is seeking post-discharge placement for this patient at the [place] level of care (*CSW will initial, date and re-position this form in  chart as items are completed):  Yes   Patient/family provided with [place] Work Department's list of facilities offering this level of care within the geographic area requested by the patient (or if unable, by the patient's family).  Yes   Patient/family informed of their freedom to choose among providers that offer the needed level of care, that participate in Medicare, Medicaid or managed care program needed by the patient, have an available bed and are willing to accept the patient.  Yes   Patient/family informed of [facility]'s ownership interest in [facility] and [facility], as well as of the fact that they are under no obligation to receive care at these facilities.  PASRR submitted to EDS on       PASRR number received on       Existing PASRR number confirmed on       FL2 transmitted to all facilities in geographic area requested by pt/family on       FL2 transmitted to all facilities within larger geographic area on       Patient informed that his/her managed care company has contracts with or will negotiate with certain facilities, including the following:        Yes   Patient/family informed of bed offers received.  Patient chooses bed at [facility]     Physician recommends and patient chooses bed at      Patient to be transferred to [facility] on 04/04/17.  Patient to be transferred to facility by [facility]     Patient family notified on 04/04/17 of transfer.  Name of family member notified:  daughter, [name]     PHYSICIAN Please prepare prescriptions     Additional Comment:     _______________________________________________ Normajean Baxter, LCSW 04/04/2017, 1:31 PM

## 2017-04-04 NOTE — Progress Notes (Signed)
Pt refused CPAP, vital signs stable, pt resting comfortably. Rt to monitor as needed.

## 2017-04-04 NOTE — Social Work (Signed)
Clinical Social Worker facilitated patient discharge including contacting patient family and facility to confirm patient discharge plans.  Clinical information faxed to facility and family agreeable with plan.    CSW arranged ambulance transport via [facility] to [name].    RN to call [phone] to give report prior to discharge.  Clinical Social Worker will sign off for now as social work intervention is no longer needed. Please consult [place] again if new need arises.  Elissa Hefty, LCSW Clinical Social Worker [phone]

## 2017-04-04 NOTE — Progress Notes (Signed)
No acute changes Remains pleasantly confused  Stable for d/c to snf--d/c summ updated CSW aware  Verneita Griffes, MD [facility] [phone]

## 2017-04-04 NOTE — Care Management (Signed)
Case manager contacted Adela Lank with [facility] concerning patient needing physical therapy at [facility] at discharge. Dr. Verlon Au will enter order for [facility] .

## 2017-04-04 NOTE — Social Work (Signed)
CSW spoke to [name] to discuss discharge plan and home health needs. [place] advised that they use [place] home health services. CSW will advise RNCM to assist.  CSW called daughter and advised of the return to [name]. Daughter in agreement.  CSW will assist with transition to [name].  Elissa Hefty, LCSW Clinical Social Worker [phone]

## 2017-04-04 NOTE — Progress Notes (Signed)
Physical Therapy Treatment Patient Details Name: Kimberly Acevedo MRN: [id number] DOB: 23-Jun-1928 Today's Date: 04/04/2017    History of Present Illness Patient presents to the hospital with an impacted femoral head fx. She had a hemiarthroplasty done on 04/01/2017. She had an IM nail done 2 months prior on the right hip. She is a high fall risk and has advanced dementia. PMH: HTN, Carcinoma, diverticulitis, breast cancer, fibromyalgia, dementia, arthritis    PT Comments    Patient progressing to OOB with +2 A this session.  Able to sit EOB with min A at times, but remains posterior and when resistive needing max A for balance.  She will need SNF level rehab at d/c.    Follow Up Recommendations  SNF     Equipment Recommendations  None recommended by PT    Recommendations for Other Services       Precautions / Restrictions Precautions Precautions: Fall;Posterior Hip Required Braces or Orthoses: Knee Immobilizer - Left Restrictions LLE Weight Bearing: Weight bearing as tolerated    Mobility  Bed Mobility Overal bed mobility: Needs Assistance Bed Mobility: Supine to Sit     Supine to sit: HOB elevated;+2 for physical assistance;Total assist     General bed mobility comments: helicopter technique on bed pad pt yelling and anxious, calmed with quiet redirection and time  Transfers Overall transfer level: Needs assistance   Transfers: Stand Pivot Transfers   Stand pivot transfers: +2 physical assistance;Max assist       General transfer comment: use of bed pad to pivot to chair with max support for balance, cues to reach around helpers shoulders (was leaning back with bilateral HHA)  Ambulation/Gait         Gait velocity: unable       Stairs            Wheelchair Mobility    Modified Rankin (Stroke Patients Only)       Balance Overall balance assessment: Needs assistance Sitting-balance support: Bilateral upper extremity supported Sitting  balance-Leahy Scale: Poor Sitting balance - Comments: leaning back and not able more so with attempts to pivot to chair                                    Cognition Arousal/Alertness: Awake/alert Behavior During Therapy: Anxious Overall Cognitive Status: History of cognitive impairments - at baseline                                        Exercises General Exercises - Lower Extremity Ankle Circles/Pumps: AROM;Both;10 reps;Supine Heel Slides: AAROM;Right;5 reps;Supine (attempted L pt resistive)    General Comments        Pertinent Vitals/Pain Faces Pain Scale: Hurts whole lot Pain Descriptors / Indicators: Crying Pain Intervention(s): Monitored during session;Repositioned;RN gave pain meds during session    Home Living                      Prior Function            PT Goals (current goals can now be found in the care plan section) Progress towards PT goals: Progressing toward goals    Frequency    Min 3X/week      PT Plan Current plan remains appropriate    Co-evaluation  AM-PAC PT "6 Clicks" Daily Activity  Outcome Measure  Difficulty turning over in bed (including adjusting bedclothes, sheets and blankets)?: Unable Difficulty moving from lying on back to sitting on the side of the bed? : Unable Difficulty sitting down on and standing up from a chair with arms (e.g., wheelchair, bedside commode, etc,.)?: Unable Help needed moving to and from a bed to chair (including a wheelchair)?: Total Help needed walking in hospital room?: Total Help needed climbing 3-5 steps with a railing? : Total 6 Click Score: 6    End of Session Equipment Utilized During Treatment: Gait belt Activity Tolerance: Patient limited by pain Patient left: in chair;with call bell/phone within reach;with chair alarm set Nurse Communication: Mobility status;Need for lift equipment PT Visit Diagnosis: Unsteadiness on feet  (R26.81);Other abnormalities of gait and mobility (R26.89);Repeated falls (R29.6);Muscle weakness (generalized) (M62.81);Difficulty in walking, not elsewhere classified (R26.2);Pain Pain - Right/Left: Left Pain - part of body: Hip     Time: 1123-1150 PT Time Calculation (min) (ACUTE ONLY): 27 min  Charges:  $Therapeutic Activity: 23-37 mins                    G CodesMagda Acevedo, [place] [phone] 04/04/2017    Reginia Naas 04/04/2017, 12:25 PM

## 2017-04-05 DIAGNOSIS — S71002S Unspecified open wound, left hip, sequela: Secondary | ICD-10-CM | POA: Diagnosis not present

## 2017-04-05 DIAGNOSIS — S72002S Fracture of unspecified part of neck of left femur, sequela: Secondary | ICD-10-CM | POA: Diagnosis not present

## 2017-04-05 DIAGNOSIS — Z96642 Presence of left artificial hip joint: Secondary | ICD-10-CM | POA: Diagnosis not present

## 2017-04-05 DIAGNOSIS — Z7901 Long term (current) use of anticoagulants: Secondary | ICD-10-CM | POA: Diagnosis not present

## 2017-04-05 NOTE — Telephone Encounter (Signed)
Sharyn Lull from [employer] calls called stating that they did not receive the Office Notes for the patient's last visit.  She asked if you could send it to this fax [phone].  Thank you.

## 2017-04-05 NOTE — Telephone Encounter (Signed)
refaxed at landline to number below.

## 2017-04-06 DIAGNOSIS — I10 Essential (primary) hypertension: Secondary | ICD-10-CM | POA: Diagnosis not present

## 2017-04-06 DIAGNOSIS — F419 Anxiety disorder, unspecified: Secondary | ICD-10-CM | POA: Diagnosis not present

## 2017-04-06 DIAGNOSIS — F0391 Unspecified dementia with behavioral disturbance: Secondary | ICD-10-CM | POA: Diagnosis not present

## 2017-04-06 DIAGNOSIS — M1991 Primary osteoarthritis, unspecified site: Secondary | ICD-10-CM | POA: Diagnosis not present

## 2017-04-06 DIAGNOSIS — S72002D Fracture of unspecified part of neck of left femur, subsequent encounter for closed fracture with routine healing: Secondary | ICD-10-CM | POA: Diagnosis not present

## 2017-04-13 DIAGNOSIS — F418 Other specified anxiety disorders: Secondary | ICD-10-CM | POA: Diagnosis not present

## 2017-04-13 DIAGNOSIS — F3289 Other specified depressive episodes: Secondary | ICD-10-CM | POA: Diagnosis not present

## 2017-04-13 DIAGNOSIS — F0281 Dementia in other diseases classified elsewhere with behavioral disturbance: Secondary | ICD-10-CM | POA: Diagnosis not present

## 2017-04-13 DIAGNOSIS — I10 Essential (primary) hypertension: Secondary | ICD-10-CM | POA: Diagnosis not present

## 2017-04-15 ENCOUNTER — Emergency Department (HOSPITAL_COMMUNITY)
Admission: EM | Admit: 2017-04-15 | Discharge: 2017-04-15 | Disposition: A | Discharge status: Home / Self Care | Payer: Medicare Other | Attending: Emergency Medicine | Admitting: Emergency Medicine

## 2017-04-15 ENCOUNTER — Encounter (HOSPITAL_COMMUNITY): Payer: Self-pay

## 2017-04-15 ENCOUNTER — Emergency Department (HOSPITAL_COMMUNITY): Payer: Medicare Other

## 2017-04-15 DIAGNOSIS — I10 Essential (primary) hypertension: Secondary | ICD-10-CM | POA: Diagnosis not present

## 2017-04-15 DIAGNOSIS — Z66 Do not resuscitate: Secondary | ICD-10-CM | POA: Diagnosis not present

## 2017-04-15 DIAGNOSIS — Z7982 Long term (current) use of aspirin: Secondary | ICD-10-CM | POA: Insufficient documentation

## 2017-04-15 DIAGNOSIS — Z7901 Long term (current) use of anticoagulants: Secondary | ICD-10-CM | POA: Insufficient documentation

## 2017-04-15 DIAGNOSIS — Z85828 Personal history of other malignant neoplasm of skin: Secondary | ICD-10-CM | POA: Insufficient documentation

## 2017-04-15 DIAGNOSIS — Z853 Personal history of malignant neoplasm of breast: Secondary | ICD-10-CM | POA: Insufficient documentation

## 2017-04-15 DIAGNOSIS — F0391 Unspecified dementia with behavioral disturbance: Secondary | ICD-10-CM | POA: Insufficient documentation

## 2017-04-15 DIAGNOSIS — N3001 Acute cystitis with hematuria: Secondary | ICD-10-CM | POA: Insufficient documentation

## 2017-04-15 DIAGNOSIS — N390 Urinary tract infection, site not specified: Secondary | ICD-10-CM | POA: Diagnosis not present

## 2017-04-15 DIAGNOSIS — R1084 Generalized abdominal pain: Secondary | ICD-10-CM | POA: Diagnosis not present

## 2017-04-15 DIAGNOSIS — R4182 Altered mental status, unspecified: Secondary | ICD-10-CM | POA: Diagnosis not present

## 2017-04-15 DIAGNOSIS — R109 Unspecified abdominal pain: Secondary | ICD-10-CM | POA: Diagnosis not present

## 2017-04-15 LAB — COMPREHENSIVE METABOLIC PANEL
ALT: 13 U/L — ABNORMAL LOW (ref 14–54)
AST: 22 U/L (ref 15–41)
Albumin: 3.1 g/dL — ABNORMAL LOW (ref 3.5–5.0)
Alkaline Phosphatase: 77 U/L (ref 38–126)
Anion gap: 8 (ref 5–15)
BUN: 11 mg/dL (ref 6–20)
CO2: 32 mmol/L (ref 22–32)
Calcium: 9.2 mg/dL (ref 8.9–10.3)
Chloride: 103 mmol/L (ref 101–111)
Creatinine, Ser: 0.48 mg/dL (ref 0.44–1.00)
GFR calc Af Amer: >60 mL/min (ref >60)
GFR calc non Af Amer: >60 mL/min (ref >60)
Glucose, Bld: 103 mg/dL — ABNORMAL HIGH (ref 65–99)
Potassium: 3.8 mmol/L (ref 3.5–5.1)
Sodium: 143 mmol/L (ref 135–145)
Total Bilirubin: 0.5 mg/dL (ref 0.3–1.2)
Total Protein: 6.9 g/dL (ref 6.5–8.1)

## 2017-04-15 LAB — CBC WITH DIFFERENTIAL/PLATELET
Basophils Absolute: 0 10*3/uL (ref 0.0–0.1)
Basophils Relative: 0 %
Eosinophils Absolute: 0.2 10*3/uL (ref 0.0–0.7)
Eosinophils Relative: 2 %
HCT: 43.2 % (ref 36.0–46.0)
Hemoglobin: 14.3 g/dL (ref 12.0–15.0)
Lymphocytes Relative: 48 %
Lymphs Abs: 3.3 10*3/uL (ref 0.7–4.0)
MCH: 31.9 pg (ref 26.0–34.0)
MCHC: 33.1 g/dL (ref 30.0–36.0)
MCV: 96.4 fL (ref 78.0–100.0)
Monocytes Absolute: 0.4 10*3/uL (ref 0.1–1.0)
Monocytes Relative: 6 %
Neutro Abs: 3.1 10*3/uL (ref 1.7–7.7)
Neutrophils Relative %: 44 %
Platelets: 259 10*3/uL (ref 150–400)
RBC: 4.48 MIL/uL (ref 3.87–5.11)
RDW: 14 % (ref 11.5–15.5)
WBC: 6.9 10*3/uL (ref 4.0–10.5)

## 2017-04-15 LAB — URINALYSIS, ROUTINE W REFLEX MICROSCOPIC
Bilirubin Urine: NEGATIVE
Glucose, UA: NEGATIVE mg/dL
Ketones, ur: NEGATIVE mg/dL
Nitrite: POSITIVE — AB
Protein, ur: 100 mg/dL — AB
Specific Gravity, Urine: 1.02 (ref 1.005–1.030)
Squamous Epithelial / LPF: NONE SEEN
pH: 7 (ref 5.0–8.0)

## 2017-04-15 LAB — POC OCCULT BLOOD, ED: Fecal Occult Bld: NEGATIVE

## 2017-04-15 LAB — LIPASE, BLOOD: Lipase: 20 U/L (ref 11–51)

## 2017-04-15 MED ORDER — LORAZEPAM 2 MG/ML IJ SOLN
0.5000 mg | Freq: Once | INTRAMUSCULAR | Status: AC
  Administered 2017-04-15: 0.5 mg via INTRAVENOUS
  Filled 2017-04-15: qty 1

## 2017-04-15 MED ORDER — FOSFOMYCIN TROMETHAMINE 3 G PO PACK
3.0000 g | PACK | Freq: Once | ORAL | Status: AC
  Administered 2017-04-15: 3 g via ORAL
  Filled 2017-04-15: qty 3

## 2017-04-15 MED ORDER — FENTANYL CITRATE (PF) 100 MCG/2ML IJ SOLN
25.0000 ug | Freq: Once | INTRAMUSCULAR | Status: AC
  Administered 2017-04-15: 25 ug via INTRAVENOUS
  Filled 2017-04-15: qty 2

## 2017-04-15 MED ORDER — IOPAMIDOL (ISOVUE-300) INJECTION 61%
INTRAVENOUS | Status: AC
  Administered 2017-04-15: 100 mL via INTRAVENOUS
  Filled 2017-04-15: qty 100

## 2017-04-15 NOTE — ED Notes (Signed)
Patient transported to CT 

## 2017-04-15 NOTE — ED Notes (Signed)
Pt is unable to sign for self. Report was called to [facility] to [place].

## 2017-04-15 NOTE — ED Triage Notes (Signed)
Pt to ED via [facility] from [place] home c/o abdominal pain and tenderness in all four quadrants. Abdomen soft per EMS and vitals stable. Pt is a DNR

## 2017-04-15 NOTE — ED Notes (Signed)
Bed: TK18 Expected date:  Expected time:  Means of arrival:  Comments: 83 yo abd pain

## 2017-04-15 NOTE — ED Provider Notes (Signed)
[facility] DEPT Provider Note   CSN: [id number] Arrival date & time: 04/15/17  1712     History   Chief Complaint Chief Complaint  Patient presents with  . Abdominal Pain    All 4 quadrants   Level 5 caveat due to dementia HPI Kimberly Acevedo is a 83 y.o. female with past medical history dementia with behavioral disturbance, dyslipidemia, fibromyalgia, GERD, hemorrhoids, diverticulitis who presents today with chief complaint acute onset of abdominal pain.  Per note from nursing home she exhibited tenderness in all 4 quadrants.  Patient is DNR.  Patient is alert and oriented to person only at baseline.  The history is provided by the nursing home.    Past Medical History:  Diagnosis Date  . Anal fissure   . Arthritis   . Bilateral edema of lower extremity   . Cognitive dysfunction    DUE TO DEMENTIA--  PER NEUROLOGIST NOTE SEVERE  . Dementia with behavioral disturbance    PER NEUROLOGIST NOTE PT ABLE TO DO ADL'S BUT UNABLE TO CARE FOR HOME AND NEEDED AND PT HAS 24 HOUR CARE  . Dyslipidemia   . Fibromyalgia   . GERD (gastroesophageal reflux disease)   . Hemorrhoids   . History of breast cancer oncologist-  dr Humphrey Rolls--- no recurrence   dx 05-19-2012  Stage I , Invasive Lobular/ LCIS & microinvasive ductal and DCIS (T1 A NX M0)--  s/p  left partial mastectomy 07-04-2012 and radiation  . History of diverticulitis of colon   . History of radiation therapy 08-10-2012 to 09-08-2012   Left breast 750 cGy in 3 sessions  . History of squamous cell carcinoma excision    NOSE  . Hypertension   . OSA on CPAP   . Prolapsed internal hemorrhoids, grade 3   . Varicose veins     Patient Active Problem List   Diagnosis Date Noted  . Left displaced femoral neck fracture ([place])   . S/p left hip fracture 03/31/2017  . Pressure injury of skin 11/02/2016  . Hyperglycemia 10/31/2016  . Closed fracture of neck of left femur ([place]) 10/30/2016  . OSA on CPAP  10/30/2016  . Volume depletion   . GI bleed 11/20/2015  . Gastrointestinal hemorrhage associated with intestinal diverticulitis   . Diverticulitis of colon   . Intra-abdominal hematoma 01/06/2015  . Normocytic normochromic anemia 01/06/2015  . Nausea vomiting and diarrhea 01/06/2015  . Bleeding internal hemorrhoids 07/16/2014  . Anal fissure - posterior 06/17/2014  . Dementia 06/15/2014  . Generalized abdominal pain   . Abdominal pain   . Pancreatitis 05/24/2014  . Leukocytosis 05/24/2014  . GERD (gastroesophageal reflux disease) 05/24/2014  . Dementia with behavioral disturbance 02/27/2014  . Rectal bleeding 05/21/2013  . Hypokalemia 05/21/2013  . Depression 05/21/2013  . Invasive lobular carcinoma of breast, stage 1 ([place]) 06/23/2012  . Hypertension, essential, benign   . Fibromyalgia   . Achalasia, esophageal     Past Surgical History:  Procedure Laterality Date  . CHOLECYSTECTOMY  2007  . COLONOSCOPY    . ESOPHAGEAL DILATION    . HEMORRHOID BANDING  Mar & Apr 2016  . RECTOCELE REPAIR    . TOTAL ABDOMINAL HYSTERECTOMY  1976  . VARICOSE VEIN SURGERY  2010   left lower extremity    OB History    No data available       Home Medications    Prior to Admission medications   Medication Sig Start Date End Date Taking? Authorizing Provider  acetaminophen (TYLENOL) 500 MG tablet Take 1 tablet (500 mg total) by mouth every 6 (six) hours as needed for mild pain. 04/01/17   [name], MD  aspirin EC 81 MG tablet Take 1 tablet (81 mg total) by mouth daily. 04/01/17   [name], MD  bisacodyl (DULCOLAX) 5 MG EC tablet Take 1 tablet (5 mg total) by mouth daily as needed for moderate constipation. 11/02/16   Reyne Dumas, MD  Cholecalciferol (VITAMIN D) 2000 UNITS CAPS Take 2,000 Units by mouth daily.     [provider]  divalproex (DEPAKOTE SPRINKLE) 125 MG capsule Take 375 mg by mouth 2 (two) times daily.    [provider]    HYDROcodone-acetaminophen (NORCO/VICODIN) 5-325 MG tablet Take 1 tablet by mouth every 6 (six) hours as needed for moderate pain. 04/03/17   Nita Sells, MD  hydrocortisone (ANUSOL-HC) 2.5 % rectal cream Place 1 application rectally 3 (three) times daily as needed for hemorrhoids or anal itching.    [provider]  hydrocortisone 2.5 % cream Apply 1 application topically 3 (three) times daily as needed. Apply a thin layer to the affected area (s) by topical route    [provider]  Infant Care Products (DERMACLOUD EX) Apply 1 application topically 3 (three) times daily. Ointment Apply Topically to rash with brief changes and incontience episodes    [provider]  loperamide (IMODIUM A-D) 2 MG tablet Take 2 mg by mouth as needed for diarrhea or loose stools.    [provider]  LORazepam (ATIVAN) 0.5 MG tablet Take 1 tablet (0.5 mg total) by mouth 3 (three) times daily as needed for anxiety. 04/03/17   Nita Sells, MD  losartan (COZAAR) 50 MG tablet Take 1 tablet (50 mg total) by mouth daily. 11/02/16   Reyne Dumas, MD  polyethylene glycol (MIRALAX / GLYCOLAX) packet Take 17 g by mouth daily as needed for mild constipation. 11/02/16   Reyne Dumas, MD  senna-docusate (SENOKOT-S) 8.6-50 MG tablet Take 1 tablet by mouth at bedtime as needed for mild constipation. 04/03/17   Nita Sells, MD  sertraline (ZOLOFT) 50 MG tablet Take 50 mg by mouth daily.  07/13/16   [provider]  vitamin B-12 (CYANOCOBALAMIN) 1000 MCG tablet Take 1,000 mcg by mouth every other day.     [provider]  warfarin (COUMADIN) 2.5 MG tablet Take 1 tablet (2.5 mg total) by mouth one time only at 6 PM. 04/03/17   Nita Sells, MD    Family History Family History  Problem Relation Age of Onset  . Cancer Mother 66       melanoma ca  . Arthritis Father   . Dementia Neg Hx     Social History Social History   Tobacco Use  .  Smoking status: Never Smoker  . Smokeless tobacco: Never Used  Substance Use Topics  . Alcohol use: No    Alcohol/week: 0.0 oz  . Drug use: No     Allergies   Penicillins; Statins; Sulfa drugs cross reactors; Tramadol hcl; Aspirin; Codeine; and Morphine and related   Review of Systems Review of Systems  Unable to perform ROS: Dementia     Physical Exam Updated Vital Signs BP 124/82   Pulse (!) 45   Temp (!) 97.5 F (36.4 C) (Oral)   Resp 20   SpO2 96%   Physical Exam  Constitutional: She appears well-developed and well-nourished. No distress.  HENT:  Head: Normocephalic and atraumatic.  Eyes:  Conjunctivae are normal. Right eye exhibits no discharge. Left eye exhibits no discharge.  Neck: No JVD present. No tracheal deviation present.  Cardiovascular: Normal rate.  Pulmonary/Chest: Effort normal.  Equal rise and fall of chest, no increased work of breathing  Abdominal: Soft. She exhibits no distension. Bowel sounds are increased. There is tenderness in the periumbilical area. There is no rigidity, no guarding, no tenderness at McBurney's point and negative Murphy's sign.  Murphy sign absent, Rovsing's absent, no CVA tenderness.  Winces on palpation of the periumbilical region.  Musculoskeletal: She exhibits no edema.  Neurological: She is alert.  Oriented to person only, fluent speech, no facial droop  Skin: Skin is warm and dry. No erythema.  Well-healing linear surgical laceration left hip with multiple staples in place  Psychiatric: She has a normal mood and affect. Her behavior is normal.  Nursing note and vitals reviewed.    ED Treatments / Results  Labs (all labs ordered are listed, but only abnormal results are displayed) Labs Reviewed  COMPREHENSIVE METABOLIC PANEL - Abnormal; Notable for the following components:      Result Value   Glucose, Bld 103 (*)    Albumin 3.1 (*)    ALT 13 (*)    All other components within normal limits  URINALYSIS, ROUTINE  W REFLEX MICROSCOPIC - Abnormal; Notable for the following components:   APPearance TURBID (*)    Hgb urine dipstick MODERATE (*)    Protein, ur 100 (*)    Nitrite POSITIVE (*)    Leukocytes, UA LARGE (*)    Bacteria, UA MANY (*)    All other components within normal limits  URINE CULTURE  CBC WITH DIFFERENTIAL/PLATELET  LIPASE, BLOOD  POC OCCULT BLOOD, ED    EKG  EKG Interpretation None       Radiology Ct Abdomen Pelvis W Contrast  Result Date: 04/15/2017 CLINICAL DATA:  Abdominal pain and tenderness tonight. EXAM: CT ABDOMEN AND PELVIS WITH CONTRAST TECHNIQUE: Multidetector CT imaging of the abdomen and pelvis was performed using the standard protocol following bolus administration of intravenous contrast. CONTRAST:  159mL ISOVUE-300 IOPAMIDOL (ISOVUE-300) INJECTION 61% COMPARISON:  CT scan 11/20/2015 FINDINGS: Lower chest: Grossly clear. 3D motion artifact. The heart is normal in size. No pericardial effusion. Small hiatal hernia. Advanced atherosclerotic calcifications involving the distal descending thoracic aorta. Hepatobiliary: No focal hepatic lesions or intrahepatic biliary dilatation. The gallbladder is surgically absent. Mild associated common bile duct dilatation. Pancreas: Moderate pancreatic atrophy but no mass, inflammation or ductal dilatation. Spleen: Normal size.  No focal lesions. Adrenals/Urinary Tract: The adrenal glands are normal. Simple appearing left renal cyst. No worrisome renal lesions or hydronephrosis. No ureteral dilatation. The bladder appears normal. Stomach/Bowel: The stomach, duodenum, small bowel and colon are grossly normal without oral contrast and given limitation of motion artifact. No obvious acute inflammatory process, mass lesions or obstructive findings. The terminal ileum appears normal. The appendix is normal. Colonic diverticulosis without findings for acute diverticulitis. Vascular/Lymphatic: Advanced atherosclerotic calcifications involving the  aorta and iliac arteries and branch vessel ostia but no aneurysm or dissection. The branch vessels are patent. Major venous structures are patent. No mesenteric or retroperitoneal mass or adenopathy. Reproductive: Surgically absent. Other: No pelvic mass or adenopathy. No free pelvic fluid collections. No inguinal mass or adenopathy. No abdominal wall hernia or subcutaneous lesions. Musculoskeletal: No acute or significant bony findings. Bilateral hip hardware is noted. IMPRESSION: No acute abdominal/ pelvic findings, mass lesions or adenopathy. Advanced atherosclerotic calcifications involving the distal  thoracic aorta and abdominal aorta and branch vessels. Status post cholecystectomy.  No biliary dilatation. Electronically Signed   By: Marijo Sanes M.D.   On: 04/15/2017 20:00    Procedures Procedures (including critical care time)  Medications Ordered in ED Medications  fentaNYL (SUBLIMAZE) injection 25 mcg (25 mcg Intravenous Given 04/15/17 1833)  iopamidol (ISOVUE-300) 61 % injection (100 mLs Intravenous Contrast Given 04/15/17 1947)  fosfomycin (MONUROL) packet 3 g (3 g Oral Given 04/15/17 2221)  LORazepam (ATIVAN) injection 0.5 mg (0.5 mg Intravenous Given 04/15/17 2238)     Initial Impression / Assessment and Plan / ED Course  I have reviewed the triage vital signs and the nursing notes.  Pertinent labs & imaging results that were available during my care of the patient were reviewed by me and considered in my medical decision making (see chart for details).     Patient presents from nursing home with complaint of abdominal pain.  Per nursing home notes, she complained of pain and tenderness in all 4 quadrants.  Afebrile, vital signs are stable.  Lab work is unremarkable, with no leukocytosis or significant electrolyte abnormality.  Hemoccult negative.  CT scan of the abdomen and pelvis shows no acute abnormalities.  Low suspicion of obstruction, perforation, appendicitis, or other acute  surgical abdominal pathology. Awaiting UA.   UA concerning for UTI, sent for culture.  Low suspicion of nephrolithiasis.  She was given fosfomycin in the ED.  Stable for discharge back to the nursing home with follow-up with primary care physician for reevaluation and repeat urine.   Final Clinical Impressions(s) / ED Diagnoses   Final diagnoses:  Acute cystitis with hematuria    ED Discharge Orders    None       Renita Papa, PA-C 04/16/17 [place], [place], DO 04/19/17 [phone]

## 2017-04-15 NOTE — Discharge Instructions (Signed)
Your have been treated for a UTI in the emergency department today.  Follow-up with a primary care physician for reevaluation of your symptoms.  Return to the ED if any concerning signs or symptoms develop.

## 2017-04-15 NOTE — ED Notes (Signed)
Periwick placed to try and obtain urine

## 2017-04-19 DIAGNOSIS — N3001 Acute cystitis with hematuria: Secondary | ICD-10-CM | POA: Diagnosis not present

## 2017-04-19 DIAGNOSIS — I10 Essential (primary) hypertension: Secondary | ICD-10-CM | POA: Diagnosis not present

## 2017-04-19 DIAGNOSIS — F0281 Dementia in other diseases classified elsewhere with behavioral disturbance: Secondary | ICD-10-CM | POA: Diagnosis not present

## 2017-04-21 ENCOUNTER — Ambulatory Visit (INDEPENDENT_AMBULATORY_CARE_PROVIDER_SITE_OTHER): Payer: Medicare Other | Admitting: Orthopedic Surgery

## 2017-04-21 ENCOUNTER — Encounter (INDEPENDENT_AMBULATORY_CARE_PROVIDER_SITE_OTHER): Payer: Self-pay | Admitting: Orthopedic Surgery

## 2017-04-21 ENCOUNTER — Ambulatory Visit (INDEPENDENT_AMBULATORY_CARE_PROVIDER_SITE_OTHER): Payer: Self-pay

## 2017-04-21 VITALS — Ht 68.0 in | Wt 149.0 lb

## 2017-04-21 DIAGNOSIS — S72002D Fracture of unspecified part of neck of left femur, subsequent encounter for closed fracture with routine healing: Secondary | ICD-10-CM

## 2017-04-21 DIAGNOSIS — Z96642 Presence of left artificial hip joint: Secondary | ICD-10-CM

## 2017-04-21 DIAGNOSIS — M25552 Pain in left hip: Secondary | ICD-10-CM

## 2017-04-21 NOTE — Progress Notes (Deleted)
Post-Op Visit Note   Patient: Kimberly Acevedo           Date of Birth: 1929/05/10           MRN: [id number] Visit Date: 04/21/2017 PCP: Deland Pretty, MD  Chief Complaint:  Chief Complaint  Patient presents with  . Left Hip - Follow-up    04/01/17 left hip hemi arthroplasty    HPI:  HPI  Ortho Exam ***  Visit Diagnoses:  1. Pain in left hip     Plan: ***  Follow-Up Instructions: No Follow-up on file.   Imaging: No results found.  Orders:  Orders Placed This Encounter  Procedures  . XR HIP UNILAT W OR W/O PELVIS 2-3 VIEWS LEFT   No orders of the defined types were placed in this encounter.    PMFS History: Patient Active Problem List   Diagnosis Date Noted  . Left displaced femoral neck fracture ([place])   . S/p left hip fracture 03/31/2017  . Pressure injury of skin 11/02/2016  . Hyperglycemia 10/31/2016  . Closed fracture of neck of left femur ([place]) 10/30/2016  . OSA on CPAP 10/30/2016  . Volume depletion   . GI bleed 11/20/2015  . Gastrointestinal hemorrhage associated with intestinal diverticulitis   . Diverticulitis of colon   . Intra-abdominal hematoma 01/06/2015  . Normocytic normochromic anemia 01/06/2015  . Nausea vomiting and diarrhea 01/06/2015  . Bleeding internal hemorrhoids 07/16/2014  . Anal fissure - posterior 06/17/2014  . Dementia 06/15/2014  . Generalized abdominal pain   . Abdominal pain   . Pancreatitis 05/24/2014  . Leukocytosis 05/24/2014  . GERD (gastroesophageal reflux disease) 05/24/2014  . Dementia with behavioral disturbance 02/27/2014  . Rectal bleeding 05/21/2013  . Hypokalemia 05/21/2013  . Depression 05/21/2013  . Invasive lobular carcinoma of breast, stage 1 ([place]) 06/23/2012  . Hypertension, essential, benign   . Fibromyalgia   . Achalasia, esophageal    Past Medical History:  Diagnosis Date  . Anal fissure   . Arthritis   . Bilateral edema of lower extremity   . Cognitive dysfunction    DUE TO DEMENTIA--   PER NEUROLOGIST NOTE SEVERE  . Dementia with behavioral disturbance    PER NEUROLOGIST NOTE PT ABLE TO DO ADL'S BUT UNABLE TO CARE FOR HOME AND NEEDED AND PT HAS 24 HOUR CARE  . Dyslipidemia   . Fibromyalgia   . GERD (gastroesophageal reflux disease)   . Hemorrhoids   . History of breast cancer oncologist-  dr Humphrey Rolls--- no recurrence   dx 05-19-2012  Stage I , Invasive Lobular/ LCIS & microinvasive ductal and DCIS (T1 A NX M0)--  s/p  left partial mastectomy 07-04-2012 and radiation  . History of diverticulitis of colon   . History of radiation therapy 08-10-2012 to 09-08-2012   Left breast 750 cGy in 3 sessions  . History of squamous cell carcinoma excision    NOSE  . Hypertension   . OSA on CPAP   . Prolapsed internal hemorrhoids, grade 3   . Varicose veins     Family History  Problem Relation Age of Onset  . Cancer Mother 39       melanoma ca  . Arthritis Father   . Dementia Neg Hx     Past Surgical History:  Procedure Laterality Date  . BREAST LUMPECTOMY WITH NEEDLE LOCALIZATION  07/04/2012   Procedure: BREAST LUMPECTOMY WITH NEEDLE LOCALIZATION;  Surgeon: Edward Jolly, MD;  Location: [facility];  Service: General;  Laterality: Left;  left  . CHOLECYSTECTOMY  2007  . COLONOSCOPY    . ESOPHAGEAL DILATION    . HEMORRHOID BANDING  Mar & Apr 2016  . HEMORRHOID SURGERY N/A 12/25/2014   Procedure: HEMORRHOIDOPEXY WITH ANAL EXAM;  Surgeon: Leighton Ruff, MD;  [facility];  Service: General;  Laterality: N/A;  . HIP ARTHROPLASTY Left 04/01/2017   Procedure: ARTHROPLASTY BIPOLAR LEFT HIP (HEMIARTHROPLASTY);  Surgeon: [name], MD;  Location: [facility];  Service: Orthopedics;  Laterality: Left;  . INTRAMEDULLARY (IM) NAIL INTERTROCHANTERIC Right 10/31/2016   Procedure: INTRAMEDULLARY (IM) NAIL INTERTROCHANTRIC;  Surgeon: [name], MD;  Location: [facility];  Service: Orthopedics;  Laterality: Right;  . RECTOCELE REPAIR    . TOTAL  ABDOMINAL HYSTERECTOMY  1976  . VARICOSE VEIN SURGERY  2010   left lower extremity   Social History   Occupational History  . Occupation: Community education officer  . Occupation: Retired   Tobacco Use  . Smoking status: Never Smoker  . Smokeless tobacco: Never Used  Substance and Sexual Activity  . Alcohol use: No    Alcohol/week: 0.0 oz  . Drug use: No  . Sexual activity: Not on file

## 2017-04-21 NOTE — Progress Notes (Signed)
Post-Op Visit Note   Patient: Kimberly Acevedo           Date of Birth: 1928-07-10           MRN: [id number] Visit Date: 04/21/2017 PCP: Deland Pretty, MD  Chief Complaint:  Chief Complaint  Patient presents with  . Left Hip - Follow-up    04/01/17 left hip hemi arthroplasty    HPI:  HPI Patient is seen status post left hip hemi arthroplasty for femoral neck fracture on 04/01/17. Today is in wheel chair. Has returned to assisted living. Is in knee immobilizer, aide reports she was placed in this during hospitalization. Exam hindered by patient poor historian due to dementia.  Ortho Exam Left hip incision healing well. Is clean dry and intact. Staples harvested today. Does have pain with rom of left hip.   Visit Diagnoses:  1. Pain in left hip     Plan: weight bearing as tolerated. Up with PT. Assistive devices per PT recs. Incisional cleansing daily with dry dressings. Follow up in office in 4 weeks with repeat radiographs.   Follow-Up Instructions: No Follow-up on file.   Imaging: No results found.  Orders:  Orders Placed This Encounter  Procedures  . XR HIP UNILAT W OR W/O PELVIS 2-3 VIEWS LEFT   No orders of the defined types were placed in this encounter.    PMFS History: Patient Active Problem List   Diagnosis Date Noted  . Left displaced femoral neck fracture ([place])   . S/p left hip fracture 03/31/2017  . Pressure injury of skin 11/02/2016  . Hyperglycemia 10/31/2016  . Closed fracture of neck of left femur ([place]) 10/30/2016  . OSA on CPAP 10/30/2016  . Volume depletion   . GI bleed 11/20/2015  . Gastrointestinal hemorrhage associated with intestinal diverticulitis   . Diverticulitis of colon   . Intra-abdominal hematoma 01/06/2015  . Normocytic normochromic anemia 01/06/2015  . Nausea vomiting and diarrhea 01/06/2015  . Bleeding internal hemorrhoids 07/16/2014  . Anal fissure - posterior 06/17/2014  . Dementia 06/15/2014  . Generalized abdominal pain    . Abdominal pain   . Pancreatitis 05/24/2014  . Leukocytosis 05/24/2014  . GERD (gastroesophageal reflux disease) 05/24/2014  . Dementia with behavioral disturbance 02/27/2014  . Rectal bleeding 05/21/2013  . Hypokalemia 05/21/2013  . Depression 05/21/2013  . Invasive lobular carcinoma of breast, stage 1 ([place]) 06/23/2012  . Hypertension, essential, benign   . Fibromyalgia   . Achalasia, esophageal    Past Medical History:  Diagnosis Date  . Anal fissure   . Arthritis   . Bilateral edema of lower extremity   . Cognitive dysfunction    DUE TO DEMENTIA--  PER NEUROLOGIST NOTE SEVERE  . Dementia with behavioral disturbance    PER NEUROLOGIST NOTE PT ABLE TO DO ADL'S BUT UNABLE TO CARE FOR HOME AND NEEDED AND PT HAS 24 HOUR CARE  . Dyslipidemia   . Fibromyalgia   . GERD (gastroesophageal reflux disease)   . Hemorrhoids   . History of breast cancer oncologist-  dr Humphrey Rolls--- no recurrence   dx 05-19-2012  Stage I , Invasive Lobular/ LCIS & microinvasive ductal and DCIS (T1 A NX M0)--  s/p  left partial mastectomy 07-04-2012 and radiation  . History of diverticulitis of colon   . History of radiation therapy 08-10-2012 to 09-08-2012   Left breast 750 cGy in 3 sessions  . History of squamous cell carcinoma excision    NOSE  . Hypertension   .  OSA on CPAP   . Prolapsed internal hemorrhoids, grade 3   . Varicose veins     Family History  Problem Relation Age of Onset  . Cancer Mother 45       melanoma ca  . Arthritis Father   . Dementia Neg Hx     Past Surgical History:  Procedure Laterality Date  . BREAST LUMPECTOMY WITH NEEDLE LOCALIZATION  07/04/2012   Procedure: BREAST LUMPECTOMY WITH NEEDLE LOCALIZATION;  Surgeon: Edward Jolly, MD;  Location: [facility];  Service: General;  Laterality: Left;  left  . CHOLECYSTECTOMY  2007  . COLONOSCOPY    . ESOPHAGEAL DILATION    . HEMORRHOID BANDING  Mar & Apr 2016  . HEMORRHOID SURGERY N/A 12/25/2014    Procedure: HEMORRHOIDOPEXY WITH ANAL EXAM;  Surgeon: Leighton Ruff, MD;  Location: [facility];  Service: General;  Laterality: N/A;  . HIP ARTHROPLASTY Left 04/01/2017   Procedure: ARTHROPLASTY BIPOLAR LEFT HIP (HEMIARTHROPLASTY);  Surgeon: [name], MD;  Location: [facility];  Service: Orthopedics;  Laterality: Left;  . INTRAMEDULLARY (IM) NAIL INTERTROCHANTERIC Right 10/31/2016   Procedure: INTRAMEDULLARY (IM) NAIL INTERTROCHANTRIC;  Surgeon: [name], MD;  Location: [facility];  Service: Orthopedics;  Laterality: Right;  . RECTOCELE REPAIR    . TOTAL ABDOMINAL HYSTERECTOMY  1976  . VARICOSE VEIN SURGERY  2010   left lower extremity   Social History   Occupational History  . Occupation: Community education officer  . Occupation: Retired   Tobacco Use  . Smoking status: Never Smoker  . Smokeless tobacco: Never Used  Substance and Sexual Activity  . Alcohol use: No    Alcohol/week: 0.0 oz  . Drug use: No  . Sexual activity: Not on file

## 2017-04-22 DIAGNOSIS — Z7901 Long term (current) use of anticoagulants: Secondary | ICD-10-CM | POA: Diagnosis not present

## 2017-04-24 DIAGNOSIS — Z4789 Encounter for other orthopedic aftercare: Secondary | ICD-10-CM | POA: Diagnosis not present

## 2017-04-24 DIAGNOSIS — Z4889 Encounter for other specified surgical aftercare: Secondary | ICD-10-CM | POA: Diagnosis not present

## 2017-04-24 DIAGNOSIS — S72141D Displaced intertrochanteric fracture of right femur, subsequent encounter for closed fracture with routine healing: Secondary | ICD-10-CM | POA: Diagnosis not present

## 2017-04-24 DIAGNOSIS — G4733 Obstructive sleep apnea (adult) (pediatric): Secondary | ICD-10-CM | POA: Diagnosis not present

## 2017-04-26 DIAGNOSIS — M25552 Pain in left hip: Secondary | ICD-10-CM | POA: Diagnosis not present

## 2017-04-26 DIAGNOSIS — I10 Essential (primary) hypertension: Secondary | ICD-10-CM | POA: Diagnosis not present

## 2017-04-26 DIAGNOSIS — Z96642 Presence of left artificial hip joint: Secondary | ICD-10-CM | POA: Diagnosis not present

## 2017-05-02 DIAGNOSIS — R531 Weakness: Secondary | ICD-10-CM | POA: Diagnosis not present

## 2017-05-03 DIAGNOSIS — F419 Anxiety disorder, unspecified: Secondary | ICD-10-CM | POA: Diagnosis not present

## 2017-05-03 DIAGNOSIS — I10 Essential (primary) hypertension: Secondary | ICD-10-CM | POA: Diagnosis not present

## 2017-05-03 DIAGNOSIS — G309 Alzheimer's disease, unspecified: Secondary | ICD-10-CM | POA: Diagnosis not present

## 2017-05-03 DIAGNOSIS — R54 Age-related physical debility: Secondary | ICD-10-CM | POA: Diagnosis not present

## 2017-05-04 DIAGNOSIS — Z7401 Bed confinement status: Secondary | ICD-10-CM | POA: Diagnosis not present

## 2017-05-04 DIAGNOSIS — S72012A Unspecified intracapsular fracture of left femur, initial encounter for closed fracture: Secondary | ICD-10-CM | POA: Diagnosis not present

## 2017-05-04 DIAGNOSIS — F418 Other specified anxiety disorders: Secondary | ICD-10-CM | POA: Diagnosis not present

## 2017-05-04 DIAGNOSIS — F0281 Dementia in other diseases classified elsewhere with behavioral disturbance: Secondary | ICD-10-CM | POA: Diagnosis not present

## 2017-05-10 DIAGNOSIS — F418 Other specified anxiety disorders: Secondary | ICD-10-CM | POA: Diagnosis not present

## 2017-05-10 DIAGNOSIS — M25552 Pain in left hip: Secondary | ICD-10-CM | POA: Diagnosis not present

## 2017-05-10 DIAGNOSIS — G308 Other Alzheimer's disease: Secondary | ICD-10-CM | POA: Diagnosis not present

## 2017-05-10 DIAGNOSIS — R531 Weakness: Secondary | ICD-10-CM | POA: Diagnosis not present

## 2017-05-10 DIAGNOSIS — I10 Essential (primary) hypertension: Secondary | ICD-10-CM | POA: Diagnosis not present

## 2017-05-17 DIAGNOSIS — R350 Frequency of micturition: Secondary | ICD-10-CM | POA: Diagnosis not present

## 2017-05-17 DIAGNOSIS — F0281 Dementia in other diseases classified elsewhere with behavioral disturbance: Secondary | ICD-10-CM | POA: Diagnosis not present

## 2017-05-17 DIAGNOSIS — M25552 Pain in left hip: Secondary | ICD-10-CM | POA: Diagnosis not present

## 2017-05-17 DIAGNOSIS — F418 Other specified anxiety disorders: Secondary | ICD-10-CM | POA: Diagnosis not present

## 2017-05-24 DIAGNOSIS — N7689 Other specified inflammation of vagina and vulva: Secondary | ICD-10-CM | POA: Diagnosis not present

## 2017-05-24 DIAGNOSIS — G4733 Obstructive sleep apnea (adult) (pediatric): Secondary | ICD-10-CM | POA: Diagnosis not present

## 2017-05-24 DIAGNOSIS — F0280 Dementia in other diseases classified elsewhere without behavioral disturbance: Secondary | ICD-10-CM | POA: Diagnosis not present

## 2017-05-24 DIAGNOSIS — Z4889 Encounter for other specified surgical aftercare: Secondary | ICD-10-CM | POA: Diagnosis not present

## 2017-05-24 DIAGNOSIS — S72141D Displaced intertrochanteric fracture of right femur, subsequent encounter for closed fracture with routine healing: Secondary | ICD-10-CM | POA: Diagnosis not present

## 2017-05-24 DIAGNOSIS — Z7901 Long term (current) use of anticoagulants: Secondary | ICD-10-CM | POA: Diagnosis not present

## 2017-05-24 DIAGNOSIS — Z4789 Encounter for other orthopedic aftercare: Secondary | ICD-10-CM | POA: Diagnosis not present

## 2017-06-05 DIAGNOSIS — I10 Essential (primary) hypertension: Secondary | ICD-10-CM | POA: Diagnosis not present

## 2017-06-05 DIAGNOSIS — F0391 Unspecified dementia with behavioral disturbance: Secondary | ICD-10-CM | POA: Diagnosis not present

## 2017-06-05 DIAGNOSIS — S72002D Fracture of unspecified part of neck of left femur, subsequent encounter for closed fracture with routine healing: Secondary | ICD-10-CM | POA: Diagnosis not present

## 2017-06-06 DIAGNOSIS — S72002D Fracture of unspecified part of neck of left femur, subsequent encounter for closed fracture with routine healing: Secondary | ICD-10-CM | POA: Diagnosis not present

## 2017-06-06 DIAGNOSIS — F0391 Unspecified dementia with behavioral disturbance: Secondary | ICD-10-CM | POA: Diagnosis not present

## 2017-06-06 DIAGNOSIS — I10 Essential (primary) hypertension: Secondary | ICD-10-CM | POA: Diagnosis not present

## 2017-06-06 DIAGNOSIS — M1991 Primary osteoarthritis, unspecified site: Secondary | ICD-10-CM | POA: Diagnosis not present

## 2017-06-21 DIAGNOSIS — S72002D Fracture of unspecified part of neck of left femur, subsequent encounter for closed fracture with routine healing: Secondary | ICD-10-CM | POA: Diagnosis not present

## 2017-06-21 DIAGNOSIS — F419 Anxiety disorder, unspecified: Secondary | ICD-10-CM | POA: Diagnosis not present

## 2017-06-21 DIAGNOSIS — M1991 Primary osteoarthritis, unspecified site: Secondary | ICD-10-CM | POA: Diagnosis not present

## 2017-06-21 DIAGNOSIS — F0391 Unspecified dementia with behavioral disturbance: Secondary | ICD-10-CM | POA: Diagnosis not present

## 2017-06-21 DIAGNOSIS — I10 Essential (primary) hypertension: Secondary | ICD-10-CM | POA: Diagnosis not present

## 2017-06-22 DIAGNOSIS — B373 Candidiasis of vulva and vagina: Secondary | ICD-10-CM | POA: Diagnosis not present

## 2017-06-22 DIAGNOSIS — B372 Candidiasis of skin and nail: Secondary | ICD-10-CM | POA: Diagnosis not present

## 2017-06-24 DIAGNOSIS — G4733 Obstructive sleep apnea (adult) (pediatric): Secondary | ICD-10-CM | POA: Diagnosis not present

## 2017-06-24 DIAGNOSIS — S72141D Displaced intertrochanteric fracture of right femur, subsequent encounter for closed fracture with routine healing: Secondary | ICD-10-CM | POA: Diagnosis not present

## 2017-06-24 DIAGNOSIS — Z4789 Encounter for other orthopedic aftercare: Secondary | ICD-10-CM | POA: Diagnosis not present

## 2017-06-24 DIAGNOSIS — Z4889 Encounter for other specified surgical aftercare: Secondary | ICD-10-CM | POA: Diagnosis not present

## 2017-07-05 DIAGNOSIS — F3489 Other specified persistent mood disorders: Secondary | ICD-10-CM | POA: Diagnosis not present

## 2017-07-05 DIAGNOSIS — G8929 Other chronic pain: Secondary | ICD-10-CM | POA: Diagnosis not present

## 2017-07-05 DIAGNOSIS — F3289 Other specified depressive episodes: Secondary | ICD-10-CM | POA: Diagnosis not present

## 2017-07-05 DIAGNOSIS — I10 Essential (primary) hypertension: Secondary | ICD-10-CM | POA: Diagnosis not present

## 2017-07-19 DIAGNOSIS — G308 Other Alzheimer's disease: Secondary | ICD-10-CM | POA: Diagnosis not present

## 2017-07-19 DIAGNOSIS — F418 Other specified anxiety disorders: Secondary | ICD-10-CM | POA: Diagnosis not present

## 2017-07-19 DIAGNOSIS — F3489 Other specified persistent mood disorders: Secondary | ICD-10-CM | POA: Diagnosis not present

## 2017-07-19 DIAGNOSIS — I10 Essential (primary) hypertension: Secondary | ICD-10-CM | POA: Diagnosis not present

## 2017-07-25 DIAGNOSIS — Z4789 Encounter for other orthopedic aftercare: Secondary | ICD-10-CM | POA: Diagnosis not present

## 2017-07-25 DIAGNOSIS — S72141D Displaced intertrochanteric fracture of right femur, subsequent encounter for closed fracture with routine healing: Secondary | ICD-10-CM | POA: Diagnosis not present

## 2017-07-25 DIAGNOSIS — G4733 Obstructive sleep apnea (adult) (pediatric): Secondary | ICD-10-CM | POA: Diagnosis not present

## 2017-07-25 DIAGNOSIS — Z4889 Encounter for other specified surgical aftercare: Secondary | ICD-10-CM | POA: Diagnosis not present

## 2017-08-09 DIAGNOSIS — F39 Unspecified mood [affective] disorder: Secondary | ICD-10-CM | POA: Diagnosis not present

## 2017-08-09 DIAGNOSIS — G308 Other Alzheimer's disease: Secondary | ICD-10-CM | POA: Diagnosis not present

## 2017-08-09 DIAGNOSIS — Z79899 Other long term (current) drug therapy: Secondary | ICD-10-CM | POA: Diagnosis not present

## 2017-08-09 DIAGNOSIS — E119 Type 2 diabetes mellitus without complications: Secondary | ICD-10-CM | POA: Diagnosis not present

## 2017-08-09 DIAGNOSIS — F3289 Other specified depressive episodes: Secondary | ICD-10-CM | POA: Diagnosis not present

## 2017-08-09 DIAGNOSIS — I10 Essential (primary) hypertension: Secondary | ICD-10-CM | POA: Diagnosis not present

## 2017-08-16 DIAGNOSIS — F3289 Other specified depressive episodes: Secondary | ICD-10-CM | POA: Diagnosis not present

## 2017-08-16 DIAGNOSIS — R3915 Urgency of urination: Secondary | ICD-10-CM | POA: Diagnosis not present

## 2017-08-16 DIAGNOSIS — G308 Other Alzheimer's disease: Secondary | ICD-10-CM | POA: Diagnosis not present

## 2017-08-16 DIAGNOSIS — N390 Urinary tract infection, site not specified: Secondary | ICD-10-CM | POA: Diagnosis not present

## 2017-08-22 DIAGNOSIS — Z4889 Encounter for other specified surgical aftercare: Secondary | ICD-10-CM | POA: Diagnosis not present

## 2017-08-22 DIAGNOSIS — S72141D Displaced intertrochanteric fracture of right femur, subsequent encounter for closed fracture with routine healing: Secondary | ICD-10-CM | POA: Diagnosis not present

## 2017-08-22 DIAGNOSIS — Z4789 Encounter for other orthopedic aftercare: Secondary | ICD-10-CM | POA: Diagnosis not present

## 2017-08-22 DIAGNOSIS — G4733 Obstructive sleep apnea (adult) (pediatric): Secondary | ICD-10-CM | POA: Diagnosis not present

## 2017-08-25 DIAGNOSIS — R531 Weakness: Secondary | ICD-10-CM | POA: Diagnosis not present

## 2017-08-30 DIAGNOSIS — I10 Essential (primary) hypertension: Secondary | ICD-10-CM | POA: Diagnosis not present

## 2017-08-30 DIAGNOSIS — F418 Other specified anxiety disorders: Secondary | ICD-10-CM | POA: Diagnosis not present

## 2017-08-30 DIAGNOSIS — G308 Other Alzheimer's disease: Secondary | ICD-10-CM | POA: Diagnosis not present

## 2017-08-30 DIAGNOSIS — F3289 Other specified depressive episodes: Secondary | ICD-10-CM | POA: Diagnosis not present

## 2017-09-13 DIAGNOSIS — F3289 Other specified depressive episodes: Secondary | ICD-10-CM | POA: Diagnosis not present

## 2017-09-13 DIAGNOSIS — F418 Other specified anxiety disorders: Secondary | ICD-10-CM | POA: Diagnosis not present

## 2017-09-13 DIAGNOSIS — I10 Essential (primary) hypertension: Secondary | ICD-10-CM | POA: Diagnosis not present

## 2017-09-13 DIAGNOSIS — R451 Restlessness and agitation: Secondary | ICD-10-CM | POA: Diagnosis not present

## 2017-10-04 DIAGNOSIS — R451 Restlessness and agitation: Secondary | ICD-10-CM | POA: Diagnosis not present

## 2017-10-04 DIAGNOSIS — F0281 Dementia in other diseases classified elsewhere with behavioral disturbance: Secondary | ICD-10-CM | POA: Diagnosis not present

## 2017-10-04 DIAGNOSIS — I10 Essential (primary) hypertension: Secondary | ICD-10-CM | POA: Diagnosis not present

## 2017-10-04 DIAGNOSIS — F419 Anxiety disorder, unspecified: Secondary | ICD-10-CM | POA: Diagnosis not present

## 2017-10-21 ENCOUNTER — Encounter: Payer: Medicare Other | Admitting: Hospice and Palliative Medicine

## 2017-10-24 ENCOUNTER — Encounter: Payer: Medicare Other | Admitting: Hospice and Palliative Medicine

## 2017-10-25 ENCOUNTER — Encounter: Payer: Medicare Other | Admitting: Hospice and Palliative Medicine

## 2017-10-25 DIAGNOSIS — K625 Hemorrhage of anus and rectum: Secondary | ICD-10-CM | POA: Diagnosis not present

## 2017-10-25 DIAGNOSIS — E538 Deficiency of other specified B group vitamins: Secondary | ICD-10-CM | POA: Diagnosis not present

## 2017-10-25 DIAGNOSIS — F0281 Dementia in other diseases classified elsewhere with behavioral disturbance: Secondary | ICD-10-CM | POA: Diagnosis not present

## 2017-10-25 DIAGNOSIS — F39 Unspecified mood [affective] disorder: Secondary | ICD-10-CM | POA: Diagnosis not present

## 2017-11-01 ENCOUNTER — Non-Acute Institutional Stay: Payer: Medicare Other | Admitting: Hospice and Palliative Medicine

## 2017-11-01 DIAGNOSIS — Z515 Encounter for palliative care: Secondary | ICD-10-CM

## 2017-11-01 NOTE — Progress Notes (Signed)
PALLIATIVE CARE CONSULT VISIT   PATIENT NAME: Kimberly Acevedo DOB: Aug 27, 1928 MRN: [id number]  PRIMARY CARE PROVIDER: Deland Pretty, MD  REFERRING PROVIDER: Deland Pretty, MD [address], [place] [address]  RESPONSIBLE PARTY:  Wilder Glade, da    RECOMMENDATIONS and PLAN:  1.Pain: patient reports hip pain today. She has been refusing PO meds, spitting them back out. Staff reports that she would take liquid vs the pills. Recommend tylenol in liquid form BID. 2. Anxiety: seemed well controlled today. She continues on Buspar 15 mg PO BID and ativan gel occasionally.  2. ACP: DNR on chart.   I spent 15 minutes providing this consultation,  from 10:00am  to 10:15 am. More than 50% of the time in this consultation was spent interviewing staff, assessing patient and coordinating communication.   HISTORY OF PRESENT ILLNESS:  Kimberly Acevedo is a 84 y.o. female with multiple medical problems in the memory care unit at [facility]. Primary NP of facility referred  Palliative Care  to help address symptom management and goals of care. Today's visit was routine to assess for any decline.   CODE STATUS: DNR  PPS: 30% HOSPICE ELIGIBILITY/DIAGNOSIS: TBD  PAST MEDICAL HISTORY:  Past Medical History:  Diagnosis Date  . Anal fissure   . Arthritis   . Bilateral edema of lower extremity   . Cognitive dysfunction    DUE TO DEMENTIA--  PER NEUROLOGIST NOTE SEVERE  . Dementia with behavioral disturbance    PER NEUROLOGIST NOTE PT ABLE TO DO ADL'S BUT UNABLE TO CARE FOR HOME AND NEEDED AND PT HAS 24 HOUR CARE  . Dyslipidemia   . Fibromyalgia   . GERD (gastroesophageal reflux disease)   . Hemorrhoids   . History of breast cancer oncologist-  dr Humphrey Rolls--- no recurrence   dx 05-19-2012  Stage I , Invasive Lobular/ LCIS & microinvasive ductal and DCIS (T1 A NX M0)--  s/p  left partial mastectomy 07-04-2012 and radiation  . History of diverticulitis of colon   . History of  radiation therapy 08-10-2012 to 09-08-2012   Left breast 750 cGy in 3 sessions  . History of squamous cell carcinoma excision    NOSE  . Hypertension   . OSA on CPAP   . Prolapsed internal hemorrhoids, grade 3   . Varicose veins     SOCIAL HX:  Social History   Tobacco Use  . Smoking status: Never Smoker  . Smokeless tobacco: Never Used  Substance Use Topics  . Alcohol use: No    Alcohol/week: 0.0 oz    ALLERGIES:  Allergies  Allergen Reactions  . Penicillins Swelling and Rash    Has patient had a PCN reaction causing immediate rash, facial/tongue/throat swelling, SOB or lightheadedness with hypotension: No Has patient had a PCN reaction causing severe rash involving mucus membranes or skin necrosis: No Has patient had a PCN reaction that required hospitalization No Has patient had a PCN reaction occurring within the last 10 years: No If all of the above answers are "NO", then may proceed with Cephalosporin use.  . Statins   . Sulfa Drugs Cross Reactors Shortness Of Breath  . Tramadol Hcl Anaphylaxis  . Aspirin Swelling  . Codeine Other (See Comments)    unknown  . Morphine And Related Other (See Comments)    Hypoglycemia     PERTINENT MEDICATIONS:  Outpatient Encounter Medications as of 11/01/2017  Medication Sig  . acetaminophen (TYLENOL) 500 MG tablet Take 1 tablet (500  mg total) by mouth every 6 (six) hours as needed for mild pain.  Marland Kitchen aspirin EC 81 MG tablet Take 1 tablet (81 mg total) by mouth daily.  . bisacodyl (DULCOLAX) 5 MG EC tablet Take 1 tablet (5 mg total) by mouth daily as needed for moderate constipation.  . Cholecalciferol (VITAMIN D) 2000 UNITS CAPS Take 2,000 Units by mouth daily.   . divalproex (DEPAKOTE SPRINKLE) 125 MG capsule Take 375 mg by mouth 2 (two) times daily.  Marland Kitchen HYDROcodone-acetaminophen (NORCO/VICODIN) 5-325 MG tablet Take 1 tablet by mouth every 6 (six) hours as needed for moderate pain.  . hydrocortisone (ANUSOL-HC) 2.5 % rectal cream  Place 1 application rectally 3 (three) times daily as needed for hemorrhoids or anal itching.  . hydrocortisone 2.5 % cream Apply 1 application topically 3 (three) times daily as needed. Apply a thin layer to the affected area (s) by topical route  . Infant Care Products (DERMACLOUD EX) Apply 1 application topically 3 (three) times daily. Ointment Apply Topically to rash with brief changes and incontience episodes  . loperamide (IMODIUM A-D) 2 MG tablet Take 2 mg by mouth as needed for diarrhea or loose stools.  Marland Kitchen LORazepam (ATIVAN) 0.5 MG tablet Take 1 tablet (0.5 mg total) by mouth 3 (three) times daily as needed for anxiety.  Marland Kitchen losartan (COZAAR) 50 MG tablet Take 1 tablet (50 mg total) by mouth daily.  . polyethylene glycol (MIRALAX / GLYCOLAX) packet Take 17 g by mouth daily as needed for mild constipation.  . senna-docusate (SENOKOT-S) 8.6-50 MG tablet Take 1 tablet by mouth at bedtime as needed for mild constipation.  . sertraline (ZOLOFT) 50 MG tablet Take 50 mg by mouth daily.   . vitamin B-12 (CYANOCOBALAMIN) 1000 MCG tablet Take 1,000 mcg by mouth every other day.   . warfarin (COUMADIN) 2.5 MG tablet Take 1 tablet (2.5 mg total) by mouth one time only at 6 PM.   No facility-administered encounter medications on file as of 11/01/2017.     PHYSICAL EXAM:   General: Alert, Caucasian female in [place] in NAD Cardiovascular: RRR Pulmonary: CTAB  Abdomen: soft, active BS; NTTP Extremities: weak upon standing Skin: thin and fragile Neurological: alert; word searches  Nathanial Rancher, NP

## 2017-11-04 DIAGNOSIS — G8929 Other chronic pain: Secondary | ICD-10-CM | POA: Diagnosis not present

## 2017-11-04 DIAGNOSIS — I10 Essential (primary) hypertension: Secondary | ICD-10-CM | POA: Diagnosis not present

## 2017-11-04 DIAGNOSIS — M25552 Pain in left hip: Secondary | ICD-10-CM | POA: Diagnosis not present

## 2017-11-04 DIAGNOSIS — R451 Restlessness and agitation: Secondary | ICD-10-CM | POA: Diagnosis not present

## 2017-11-09 DIAGNOSIS — G8929 Other chronic pain: Secondary | ICD-10-CM | POA: Diagnosis not present

## 2017-11-09 DIAGNOSIS — R451 Restlessness and agitation: Secondary | ICD-10-CM | POA: Diagnosis not present

## 2017-11-09 DIAGNOSIS — M25552 Pain in left hip: Secondary | ICD-10-CM | POA: Diagnosis not present

## 2017-11-09 DIAGNOSIS — I10 Essential (primary) hypertension: Secondary | ICD-10-CM | POA: Diagnosis not present

## 2017-12-01 DIAGNOSIS — G309 Alzheimer's disease, unspecified: Secondary | ICD-10-CM | POA: Diagnosis not present

## 2017-12-01 DIAGNOSIS — R2681 Unsteadiness on feet: Secondary | ICD-10-CM | POA: Diagnosis not present

## 2017-12-01 DIAGNOSIS — I10 Essential (primary) hypertension: Secondary | ICD-10-CM | POA: Diagnosis not present

## 2017-12-01 DIAGNOSIS — F339 Major depressive disorder, recurrent, unspecified: Secondary | ICD-10-CM | POA: Diagnosis not present

## 2017-12-06 DIAGNOSIS — R451 Restlessness and agitation: Secondary | ICD-10-CM | POA: Diagnosis not present

## 2017-12-06 DIAGNOSIS — G309 Alzheimer's disease, unspecified: Secondary | ICD-10-CM | POA: Diagnosis not present

## 2017-12-06 DIAGNOSIS — I10 Essential (primary) hypertension: Secondary | ICD-10-CM | POA: Diagnosis not present

## 2017-12-19 DIAGNOSIS — Z043 Encounter for examination and observation following other accident: Secondary | ICD-10-CM | POA: Diagnosis not present

## 2017-12-19 DIAGNOSIS — M25569 Pain in unspecified knee: Secondary | ICD-10-CM | POA: Diagnosis not present

## 2017-12-19 DIAGNOSIS — S199XXA Unspecified injury of neck, initial encounter: Secondary | ICD-10-CM | POA: Diagnosis not present

## 2017-12-19 DIAGNOSIS — S0990XA Unspecified injury of head, initial encounter: Secondary | ICD-10-CM | POA: Diagnosis not present

## 2017-12-19 DIAGNOSIS — M542 Cervicalgia: Secondary | ICD-10-CM | POA: Diagnosis not present

## 2017-12-20 DIAGNOSIS — G309 Alzheimer's disease, unspecified: Secondary | ICD-10-CM | POA: Diagnosis not present

## 2017-12-20 DIAGNOSIS — R451 Restlessness and agitation: Secondary | ICD-10-CM | POA: Diagnosis not present

## 2017-12-20 DIAGNOSIS — I10 Essential (primary) hypertension: Secondary | ICD-10-CM | POA: Diagnosis not present

## 2017-12-20 DIAGNOSIS — R2681 Unsteadiness on feet: Secondary | ICD-10-CM | POA: Diagnosis not present

## 2017-12-27 DIAGNOSIS — I10 Essential (primary) hypertension: Secondary | ICD-10-CM | POA: Diagnosis not present

## 2017-12-27 DIAGNOSIS — G309 Alzheimer's disease, unspecified: Secondary | ICD-10-CM | POA: Diagnosis not present

## 2017-12-27 DIAGNOSIS — R451 Restlessness and agitation: Secondary | ICD-10-CM | POA: Diagnosis not present

## 2018-01-05 DIAGNOSIS — I10 Essential (primary) hypertension: Secondary | ICD-10-CM | POA: Diagnosis not present

## 2018-01-05 DIAGNOSIS — G309 Alzheimer's disease, unspecified: Secondary | ICD-10-CM | POA: Diagnosis not present

## 2018-02-07 DIAGNOSIS — G309 Alzheimer's disease, unspecified: Secondary | ICD-10-CM | POA: Diagnosis not present

## 2018-02-07 DIAGNOSIS — I10 Essential (primary) hypertension: Secondary | ICD-10-CM | POA: Diagnosis not present

## 2018-02-07 DIAGNOSIS — R451 Restlessness and agitation: Secondary | ICD-10-CM | POA: Diagnosis not present

## 2018-03-07 DIAGNOSIS — G309 Alzheimer's disease, unspecified: Secondary | ICD-10-CM | POA: Diagnosis not present

## 2018-03-07 DIAGNOSIS — I10 Essential (primary) hypertension: Secondary | ICD-10-CM | POA: Diagnosis not present

## 2018-03-07 DIAGNOSIS — R451 Restlessness and agitation: Secondary | ICD-10-CM | POA: Diagnosis not present

## 2018-03-21 DIAGNOSIS — D510 Vitamin B12 deficiency anemia due to intrinsic factor deficiency: Secondary | ICD-10-CM | POA: Diagnosis not present

## 2018-03-21 DIAGNOSIS — R451 Restlessness and agitation: Secondary | ICD-10-CM | POA: Diagnosis not present

## 2018-03-21 DIAGNOSIS — G309 Alzheimer's disease, unspecified: Secondary | ICD-10-CM | POA: Diagnosis not present

## 2018-03-28 DIAGNOSIS — E7800 Pure hypercholesterolemia, unspecified: Secondary | ICD-10-CM | POA: Diagnosis not present

## 2018-03-28 DIAGNOSIS — Z79899 Other long term (current) drug therapy: Secondary | ICD-10-CM | POA: Diagnosis not present

## 2018-03-28 DIAGNOSIS — G309 Alzheimer's disease, unspecified: Secondary | ICD-10-CM | POA: Diagnosis not present

## 2018-03-28 DIAGNOSIS — E1100 Type 2 diabetes mellitus with hyperosmolarity without nonketotic hyperglycemic-hyperosmolar coma (NKHHC): Secondary | ICD-10-CM | POA: Diagnosis not present

## 2018-03-28 DIAGNOSIS — D510 Vitamin B12 deficiency anemia due to intrinsic factor deficiency: Secondary | ICD-10-CM | POA: Diagnosis not present

## 2018-03-28 DIAGNOSIS — I10 Essential (primary) hypertension: Secondary | ICD-10-CM | POA: Diagnosis not present

## 2018-03-28 DIAGNOSIS — R451 Restlessness and agitation: Secondary | ICD-10-CM | POA: Diagnosis not present

## 2018-03-28 DIAGNOSIS — R112 Nausea with vomiting, unspecified: Secondary | ICD-10-CM | POA: Diagnosis not present

## 2018-03-30 DIAGNOSIS — I10 Essential (primary) hypertension: Secondary | ICD-10-CM | POA: Diagnosis not present

## 2018-03-30 DIAGNOSIS — G309 Alzheimer's disease, unspecified: Secondary | ICD-10-CM | POA: Diagnosis not present

## 2018-03-30 DIAGNOSIS — W06XXXA Fall from bed, initial encounter: Secondary | ICD-10-CM | POA: Diagnosis not present

## 2018-03-30 DIAGNOSIS — R451 Restlessness and agitation: Secondary | ICD-10-CM | POA: Diagnosis not present

## 2018-04-04 DIAGNOSIS — G309 Alzheimer's disease, unspecified: Secondary | ICD-10-CM | POA: Diagnosis not present

## 2018-04-04 DIAGNOSIS — I10 Essential (primary) hypertension: Secondary | ICD-10-CM | POA: Diagnosis not present

## 2018-04-04 DIAGNOSIS — F339 Major depressive disorder, recurrent, unspecified: Secondary | ICD-10-CM | POA: Diagnosis not present

## 2018-04-04 DIAGNOSIS — R633 Feeding difficulties: Secondary | ICD-10-CM | POA: Diagnosis not present

## 2018-04-18 DIAGNOSIS — R451 Restlessness and agitation: Secondary | ICD-10-CM | POA: Diagnosis not present

## 2018-04-18 DIAGNOSIS — G309 Alzheimer's disease, unspecified: Secondary | ICD-10-CM | POA: Diagnosis not present

## 2018-04-18 DIAGNOSIS — F339 Major depressive disorder, recurrent, unspecified: Secondary | ICD-10-CM | POA: Diagnosis not present

## 2018-06-13 DIAGNOSIS — I10 Essential (primary) hypertension: Secondary | ICD-10-CM | POA: Diagnosis not present

## 2018-06-13 DIAGNOSIS — R451 Restlessness and agitation: Secondary | ICD-10-CM | POA: Diagnosis not present

## 2018-06-13 DIAGNOSIS — D518 Other vitamin B12 deficiency anemias: Secondary | ICD-10-CM | POA: Diagnosis not present

## 2018-06-13 DIAGNOSIS — G308 Other Alzheimer's disease: Secondary | ICD-10-CM | POA: Diagnosis not present

## 2018-06-20 DIAGNOSIS — R05 Cough: Secondary | ICD-10-CM | POA: Diagnosis not present

## 2018-06-20 DIAGNOSIS — E119 Type 2 diabetes mellitus without complications: Secondary | ICD-10-CM | POA: Diagnosis not present

## 2018-06-20 DIAGNOSIS — G308 Other Alzheimer's disease: Secondary | ICD-10-CM | POA: Diagnosis not present

## 2018-06-20 DIAGNOSIS — F0281 Dementia in other diseases classified elsewhere with behavioral disturbance: Secondary | ICD-10-CM | POA: Diagnosis not present

## 2018-06-26 DIAGNOSIS — R0981 Nasal congestion: Secondary | ICD-10-CM | POA: Diagnosis not present

## 2018-06-26 DIAGNOSIS — F0280 Dementia in other diseases classified elsewhere without behavioral disturbance: Secondary | ICD-10-CM | POA: Diagnosis not present

## 2018-06-26 DIAGNOSIS — G309 Alzheimer's disease, unspecified: Secondary | ICD-10-CM | POA: Diagnosis not present

## 2018-06-26 DIAGNOSIS — R05 Cough: Secondary | ICD-10-CM | POA: Diagnosis not present

## 2018-06-26 DIAGNOSIS — Z880 Allergy status to penicillin: Secondary | ICD-10-CM | POA: Diagnosis not present

## 2018-06-26 DIAGNOSIS — Z79899 Other long term (current) drug therapy: Secondary | ICD-10-CM | POA: Diagnosis not present

## 2018-06-26 DIAGNOSIS — J069 Acute upper respiratory infection, unspecified: Secondary | ICD-10-CM | POA: Diagnosis not present

## 2018-06-26 DIAGNOSIS — R0602 Shortness of breath: Secondary | ICD-10-CM | POA: Diagnosis not present

## 2018-06-26 DIAGNOSIS — R079 Chest pain, unspecified: Secondary | ICD-10-CM | POA: Diagnosis not present

## 2018-06-27 DIAGNOSIS — F339 Major depressive disorder, recurrent, unspecified: Secondary | ICD-10-CM | POA: Diagnosis not present

## 2018-06-27 DIAGNOSIS — R05 Cough: Secondary | ICD-10-CM | POA: Diagnosis not present

## 2018-06-27 DIAGNOSIS — E119 Type 2 diabetes mellitus without complications: Secondary | ICD-10-CM | POA: Diagnosis not present

## 2018-06-27 DIAGNOSIS — F0391 Unspecified dementia with behavioral disturbance: Secondary | ICD-10-CM | POA: Diagnosis not present

## 2018-07-18 DIAGNOSIS — I10 Essential (primary) hypertension: Secondary | ICD-10-CM | POA: Diagnosis not present

## 2018-07-18 DIAGNOSIS — F0391 Unspecified dementia with behavioral disturbance: Secondary | ICD-10-CM | POA: Diagnosis not present

## 2018-07-18 DIAGNOSIS — E1100 Type 2 diabetes mellitus with hyperosmolarity without nonketotic hyperglycemic-hyperosmolar coma (NKHHC): Secondary | ICD-10-CM | POA: Diagnosis not present

## 2018-08-22 DIAGNOSIS — Z79899 Other long term (current) drug therapy: Secondary | ICD-10-CM | POA: Diagnosis not present

## 2018-08-23 DIAGNOSIS — F39 Unspecified mood [affective] disorder: Secondary | ICD-10-CM | POA: Diagnosis not present

## 2018-08-23 DIAGNOSIS — F339 Major depressive disorder, recurrent, unspecified: Secondary | ICD-10-CM | POA: Diagnosis not present

## 2018-08-23 DIAGNOSIS — G309 Alzheimer's disease, unspecified: Secondary | ICD-10-CM | POA: Diagnosis not present

## 2018-08-23 DIAGNOSIS — I10 Essential (primary) hypertension: Secondary | ICD-10-CM | POA: Diagnosis not present

## 2018-08-23 DIAGNOSIS — F0281 Dementia in other diseases classified elsewhere with behavioral disturbance: Secondary | ICD-10-CM | POA: Diagnosis not present

## 2018-08-23 DIAGNOSIS — R451 Restlessness and agitation: Secondary | ICD-10-CM | POA: Diagnosis not present

## 2018-08-23 DIAGNOSIS — E119 Type 2 diabetes mellitus without complications: Secondary | ICD-10-CM | POA: Diagnosis not present

## 2018-08-29 DIAGNOSIS — E1100 Type 2 diabetes mellitus with hyperosmolarity without nonketotic hyperglycemic-hyperosmolar coma (NKHHC): Secondary | ICD-10-CM | POA: Diagnosis not present

## 2018-08-29 DIAGNOSIS — I10 Essential (primary) hypertension: Secondary | ICD-10-CM | POA: Diagnosis not present

## 2018-08-29 DIAGNOSIS — F0391 Unspecified dementia with behavioral disturbance: Secondary | ICD-10-CM | POA: Diagnosis not present

## 2018-09-19 DIAGNOSIS — I10 Essential (primary) hypertension: Secondary | ICD-10-CM | POA: Diagnosis not present

## 2018-09-19 DIAGNOSIS — L03818 Cellulitis of other sites: Secondary | ICD-10-CM | POA: Diagnosis not present

## 2018-09-19 DIAGNOSIS — E1100 Type 2 diabetes mellitus with hyperosmolarity without nonketotic hyperglycemic-hyperosmolar coma (NKHHC): Secondary | ICD-10-CM | POA: Diagnosis not present

## 2018-09-19 DIAGNOSIS — F0391 Unspecified dementia with behavioral disturbance: Secondary | ICD-10-CM | POA: Diagnosis not present

## 2018-09-20 DIAGNOSIS — F0391 Unspecified dementia with behavioral disturbance: Secondary | ICD-10-CM | POA: Diagnosis not present

## 2018-09-20 DIAGNOSIS — F339 Major depressive disorder, recurrent, unspecified: Secondary | ICD-10-CM | POA: Diagnosis not present

## 2018-09-26 DIAGNOSIS — I10 Essential (primary) hypertension: Secondary | ICD-10-CM | POA: Diagnosis not present

## 2018-09-26 DIAGNOSIS — F0391 Unspecified dementia with behavioral disturbance: Secondary | ICD-10-CM | POA: Diagnosis not present

## 2018-09-26 DIAGNOSIS — E1100 Type 2 diabetes mellitus with hyperosmolarity without nonketotic hyperglycemic-hyperosmolar coma (NKHHC): Secondary | ICD-10-CM | POA: Diagnosis not present

## 2018-10-10 DIAGNOSIS — Z79899 Other long term (current) drug therapy: Secondary | ICD-10-CM | POA: Diagnosis not present

## 2018-10-17 DIAGNOSIS — W57XXXA Bitten or stung by nonvenomous insect and other nonvenomous arthropods, initial encounter: Secondary | ICD-10-CM | POA: Diagnosis not present

## 2018-10-17 DIAGNOSIS — I10 Essential (primary) hypertension: Secondary | ICD-10-CM | POA: Diagnosis not present

## 2018-10-17 DIAGNOSIS — E1100 Type 2 diabetes mellitus with hyperosmolarity without nonketotic hyperglycemic-hyperosmolar coma (NKHHC): Secondary | ICD-10-CM | POA: Diagnosis not present

## 2018-10-17 DIAGNOSIS — F0391 Unspecified dementia with behavioral disturbance: Secondary | ICD-10-CM | POA: Diagnosis not present

## 2018-10-24 DIAGNOSIS — F0391 Unspecified dementia with behavioral disturbance: Secondary | ICD-10-CM | POA: Diagnosis not present

## 2018-10-24 DIAGNOSIS — E1100 Type 2 diabetes mellitus with hyperosmolarity without nonketotic hyperglycemic-hyperosmolar coma (NKHHC): Secondary | ICD-10-CM | POA: Diagnosis not present

## 2018-10-24 DIAGNOSIS — I10 Essential (primary) hypertension: Secondary | ICD-10-CM | POA: Diagnosis not present

## 2018-11-13 DIAGNOSIS — F0391 Unspecified dementia with behavioral disturbance: Secondary | ICD-10-CM | POA: Diagnosis not present

## 2018-11-13 DIAGNOSIS — F339 Major depressive disorder, recurrent, unspecified: Secondary | ICD-10-CM | POA: Diagnosis not present

## 2018-11-23 DIAGNOSIS — R269 Unspecified abnormalities of gait and mobility: Secondary | ICD-10-CM | POA: Diagnosis not present

## 2018-11-23 DIAGNOSIS — I10 Essential (primary) hypertension: Secondary | ICD-10-CM | POA: Diagnosis not present

## 2018-11-23 DIAGNOSIS — G309 Alzheimer's disease, unspecified: Secondary | ICD-10-CM | POA: Diagnosis not present

## 2018-11-23 DIAGNOSIS — E1100 Type 2 diabetes mellitus with hyperosmolarity without nonketotic hyperglycemic-hyperosmolar coma (NKHHC): Secondary | ICD-10-CM | POA: Diagnosis not present

## 2018-11-27 DIAGNOSIS — I10 Essential (primary) hypertension: Secondary | ICD-10-CM | POA: Diagnosis not present

## 2018-11-27 DIAGNOSIS — E1100 Type 2 diabetes mellitus with hyperosmolarity without nonketotic hyperglycemic-hyperosmolar coma (NKHHC): Secondary | ICD-10-CM | POA: Diagnosis not present

## 2018-11-27 DIAGNOSIS — Z79899 Other long term (current) drug therapy: Secondary | ICD-10-CM | POA: Diagnosis not present

## 2018-12-05 DIAGNOSIS — B351 Tinea unguium: Secondary | ICD-10-CM | POA: Diagnosis not present

## 2018-12-05 DIAGNOSIS — E0842 Diabetes mellitus due to underlying condition with diabetic polyneuropathy: Secondary | ICD-10-CM | POA: Diagnosis not present

## 2018-12-05 DIAGNOSIS — E1159 Type 2 diabetes mellitus with other circulatory complications: Secondary | ICD-10-CM | POA: Diagnosis not present

## 2018-12-05 DIAGNOSIS — D510 Vitamin B12 deficiency anemia due to intrinsic factor deficiency: Secondary | ICD-10-CM | POA: Diagnosis not present

## 2018-12-06 DIAGNOSIS — F0281 Dementia in other diseases classified elsewhere with behavioral disturbance: Secondary | ICD-10-CM | POA: Diagnosis not present

## 2018-12-06 DIAGNOSIS — I10 Essential (primary) hypertension: Secondary | ICD-10-CM | POA: Diagnosis not present

## 2018-12-06 DIAGNOSIS — E119 Type 2 diabetes mellitus without complications: Secondary | ICD-10-CM | POA: Diagnosis not present

## 2018-12-06 DIAGNOSIS — R269 Unspecified abnormalities of gait and mobility: Secondary | ICD-10-CM | POA: Diagnosis not present

## 2018-12-25 DIAGNOSIS — G309 Alzheimer's disease, unspecified: Secondary | ICD-10-CM | POA: Diagnosis not present

## 2018-12-25 DIAGNOSIS — I10 Essential (primary) hypertension: Secondary | ICD-10-CM | POA: Diagnosis not present

## 2018-12-25 DIAGNOSIS — F339 Major depressive disorder, recurrent, unspecified: Secondary | ICD-10-CM | POA: Diagnosis not present

## 2018-12-25 DIAGNOSIS — R2681 Unsteadiness on feet: Secondary | ICD-10-CM | POA: Diagnosis not present

## 2019-02-05 DIAGNOSIS — E1159 Type 2 diabetes mellitus with other circulatory complications: Secondary | ICD-10-CM | POA: Diagnosis not present

## 2019-02-05 DIAGNOSIS — B351 Tinea unguium: Secondary | ICD-10-CM | POA: Diagnosis not present

## 2019-02-05 DIAGNOSIS — E0842 Diabetes mellitus due to underlying condition with diabetic polyneuropathy: Secondary | ICD-10-CM | POA: Diagnosis not present

## 2019-02-05 DIAGNOSIS — D510 Vitamin B12 deficiency anemia due to intrinsic factor deficiency: Secondary | ICD-10-CM | POA: Diagnosis not present

## 2019-03-07 DIAGNOSIS — E782 Mixed hyperlipidemia: Secondary | ICD-10-CM | POA: Diagnosis not present

## 2019-03-07 DIAGNOSIS — F0391 Unspecified dementia with behavioral disturbance: Secondary | ICD-10-CM | POA: Diagnosis not present

## 2019-03-07 DIAGNOSIS — E1159 Type 2 diabetes mellitus with other circulatory complications: Secondary | ICD-10-CM | POA: Diagnosis not present

## 2019-03-07 DIAGNOSIS — I10 Essential (primary) hypertension: Secondary | ICD-10-CM | POA: Diagnosis not present

## 2019-03-20 DIAGNOSIS — D510 Vitamin B12 deficiency anemia due to intrinsic factor deficiency: Secondary | ICD-10-CM | POA: Diagnosis not present

## 2019-03-20 DIAGNOSIS — Z79899 Other long term (current) drug therapy: Secondary | ICD-10-CM | POA: Diagnosis not present

## 2019-03-20 DIAGNOSIS — E782 Mixed hyperlipidemia: Secondary | ICD-10-CM | POA: Diagnosis not present

## 2019-03-20 DIAGNOSIS — E1100 Type 2 diabetes mellitus with hyperosmolarity without nonketotic hyperglycemic-hyperosmolar coma (NKHHC): Secondary | ICD-10-CM | POA: Diagnosis not present

## 2019-03-21 DIAGNOSIS — I10 Essential (primary) hypertension: Secondary | ICD-10-CM | POA: Diagnosis not present

## 2019-03-21 DIAGNOSIS — E782 Mixed hyperlipidemia: Secondary | ICD-10-CM | POA: Diagnosis not present

## 2019-03-21 DIAGNOSIS — E1159 Type 2 diabetes mellitus with other circulatory complications: Secondary | ICD-10-CM | POA: Diagnosis not present

## 2019-03-21 DIAGNOSIS — F0391 Unspecified dementia with behavioral disturbance: Secondary | ICD-10-CM | POA: Diagnosis not present

## 2019-04-19 DIAGNOSIS — F0391 Unspecified dementia with behavioral disturbance: Secondary | ICD-10-CM | POA: Diagnosis not present

## 2019-04-19 DIAGNOSIS — I10 Essential (primary) hypertension: Secondary | ICD-10-CM | POA: Diagnosis not present

## 2019-04-19 DIAGNOSIS — E782 Mixed hyperlipidemia: Secondary | ICD-10-CM | POA: Diagnosis not present

## 2019-04-19 DIAGNOSIS — E1159 Type 2 diabetes mellitus with other circulatory complications: Secondary | ICD-10-CM | POA: Diagnosis not present

## 2019-05-10 DIAGNOSIS — I10 Essential (primary) hypertension: Secondary | ICD-10-CM | POA: Diagnosis not present

## 2019-05-10 DIAGNOSIS — E782 Mixed hyperlipidemia: Secondary | ICD-10-CM | POA: Diagnosis not present

## 2019-05-10 DIAGNOSIS — E1159 Type 2 diabetes mellitus with other circulatory complications: Secondary | ICD-10-CM | POA: Diagnosis not present

## 2019-05-10 DIAGNOSIS — F0391 Unspecified dementia with behavioral disturbance: Secondary | ICD-10-CM | POA: Diagnosis not present

## 2019-05-14 DIAGNOSIS — R109 Unspecified abdominal pain: Secondary | ICD-10-CM | POA: Diagnosis not present

## 2019-05-14 DIAGNOSIS — F0280 Dementia in other diseases classified elsewhere without behavioral disturbance: Secondary | ICD-10-CM | POA: Diagnosis not present

## 2019-05-14 DIAGNOSIS — R1031 Right lower quadrant pain: Secondary | ICD-10-CM | POA: Diagnosis not present

## 2019-05-14 DIAGNOSIS — G309 Alzheimer's disease, unspecified: Secondary | ICD-10-CM | POA: Diagnosis not present

## 2019-05-14 DIAGNOSIS — R10817 Generalized abdominal tenderness: Secondary | ICD-10-CM | POA: Diagnosis not present

## 2019-05-14 DIAGNOSIS — I10 Essential (primary) hypertension: Secondary | ICD-10-CM | POA: Diagnosis not present

## 2019-05-14 DIAGNOSIS — R55 Syncope and collapse: Secondary | ICD-10-CM | POA: Diagnosis not present

## 2019-05-14 DIAGNOSIS — R1110 Vomiting, unspecified: Secondary | ICD-10-CM | POA: Diagnosis not present

## 2019-05-14 DIAGNOSIS — R112 Nausea with vomiting, unspecified: Secondary | ICD-10-CM | POA: Diagnosis not present

## 2019-05-14 DIAGNOSIS — F329 Major depressive disorder, single episode, unspecified: Secondary | ICD-10-CM | POA: Diagnosis not present

## 2019-05-14 DIAGNOSIS — R404 Transient alteration of awareness: Secondary | ICD-10-CM | POA: Diagnosis not present

## 2019-05-17 DIAGNOSIS — R55 Syncope and collapse: Secondary | ICD-10-CM | POA: Diagnosis not present

## 2019-05-17 DIAGNOSIS — I10 Essential (primary) hypertension: Secondary | ICD-10-CM | POA: Diagnosis not present

## 2019-05-17 DIAGNOSIS — E782 Mixed hyperlipidemia: Secondary | ICD-10-CM | POA: Diagnosis not present

## 2019-05-17 DIAGNOSIS — F0391 Unspecified dementia with behavioral disturbance: Secondary | ICD-10-CM | POA: Diagnosis not present

## 2019-05-22 IMAGING — CR DG TIBIA/FIBULA 2V*R*
4 series · 4 of 4 positions shown · non-contrast
Comparison: None.

CLINICAL DATA: Pt c/o right leg pain s/p fall today at [HOSPITAL]. Known fracture of right hip. H/o arthritis. *Best obtainable,
pt in severe pain.

EXAM:
RIGHT TIBIA AND FIBULA - 2 VIEW

[x tib-fib ap right (1 of 2)]
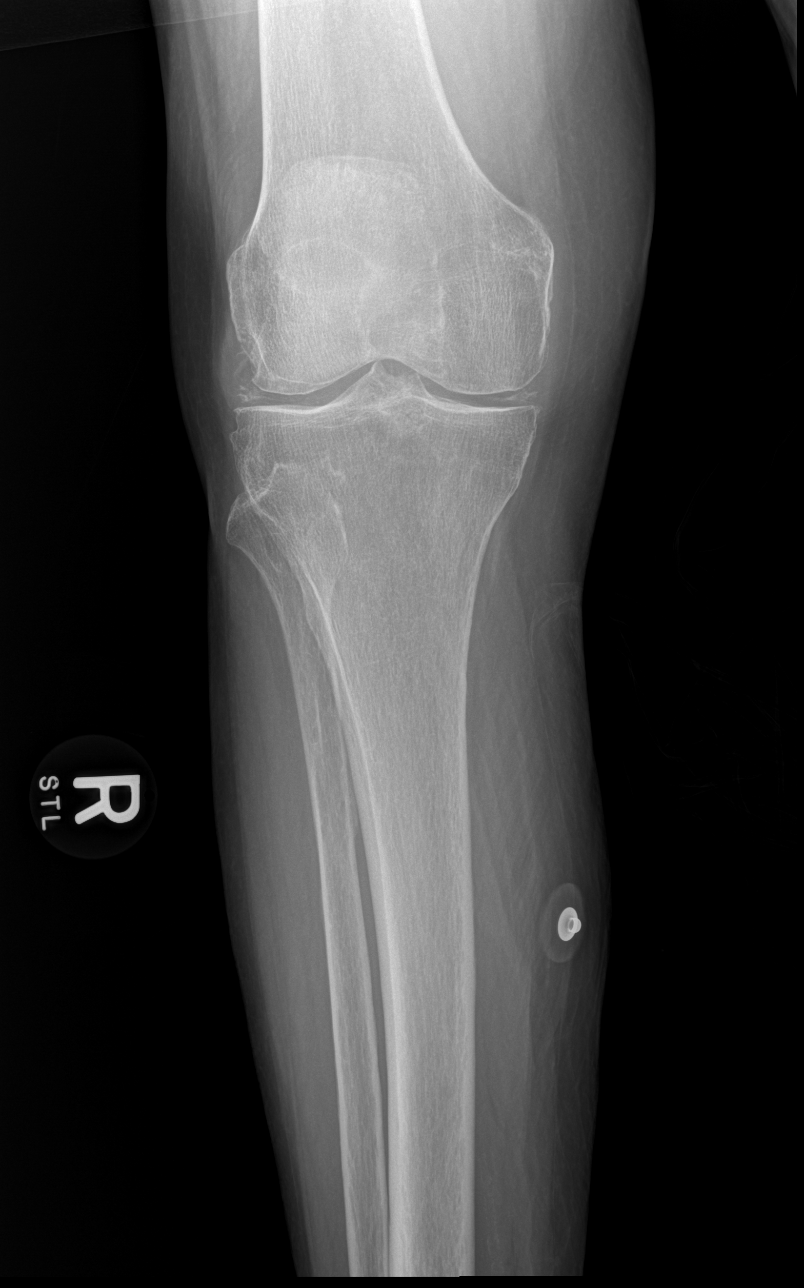

[x tib-fib ap right (2 of 2)]
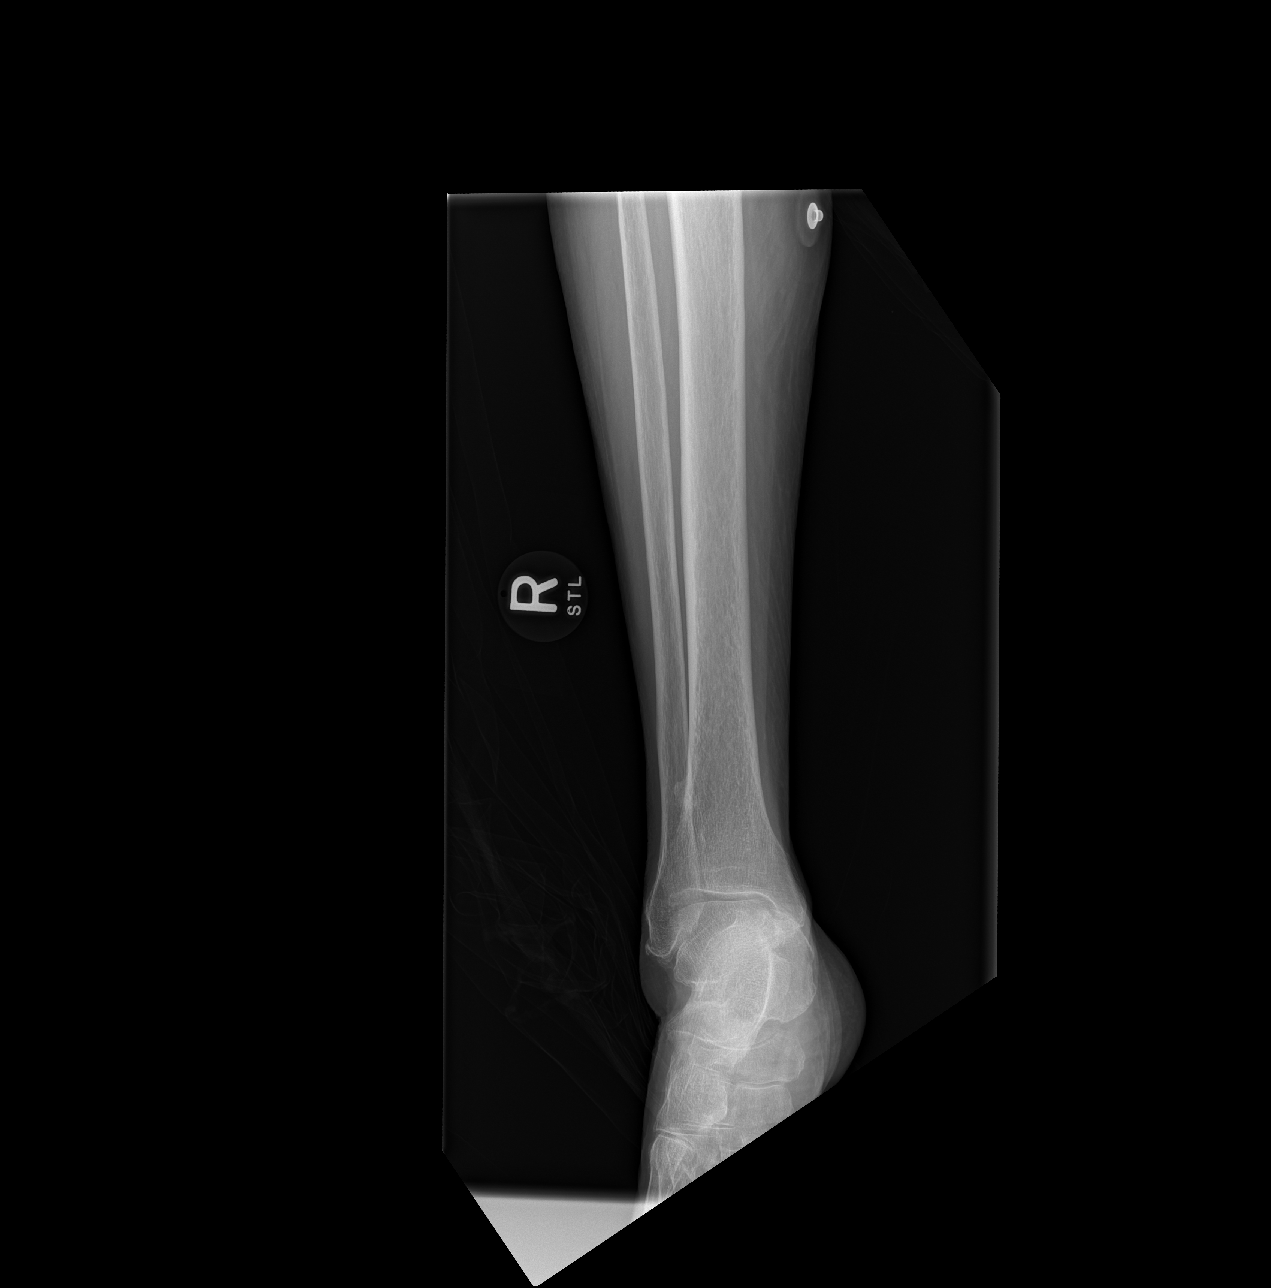

[x tib-fib lat right (1 of 2)]
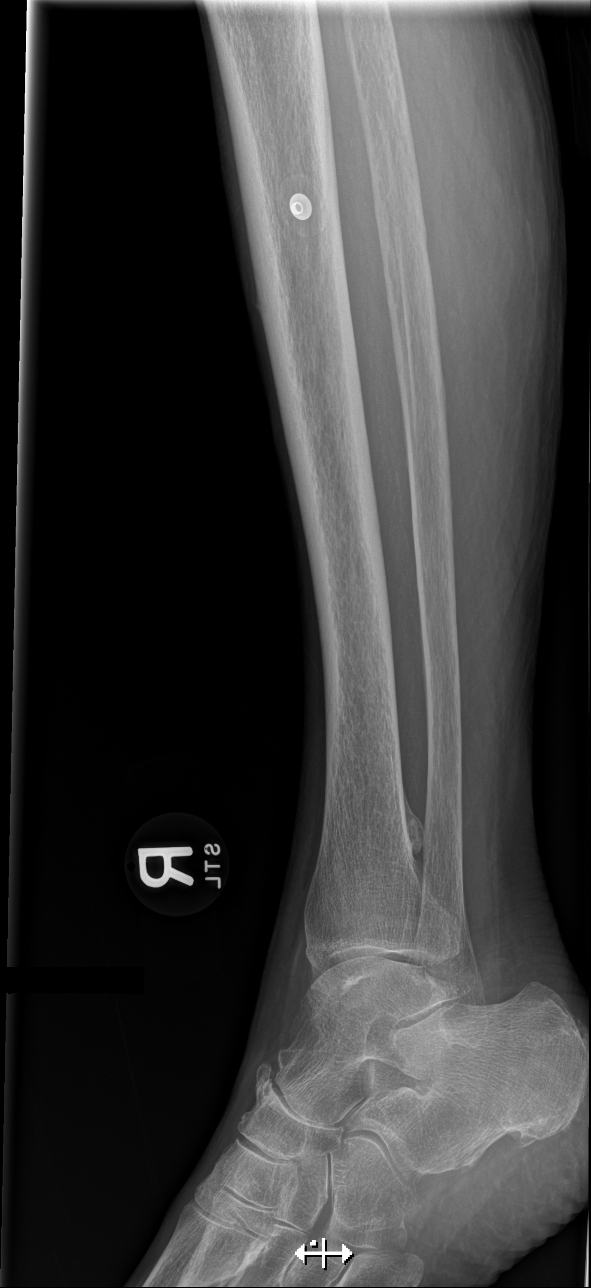

[x tib-fib lat right (2 of 2)]
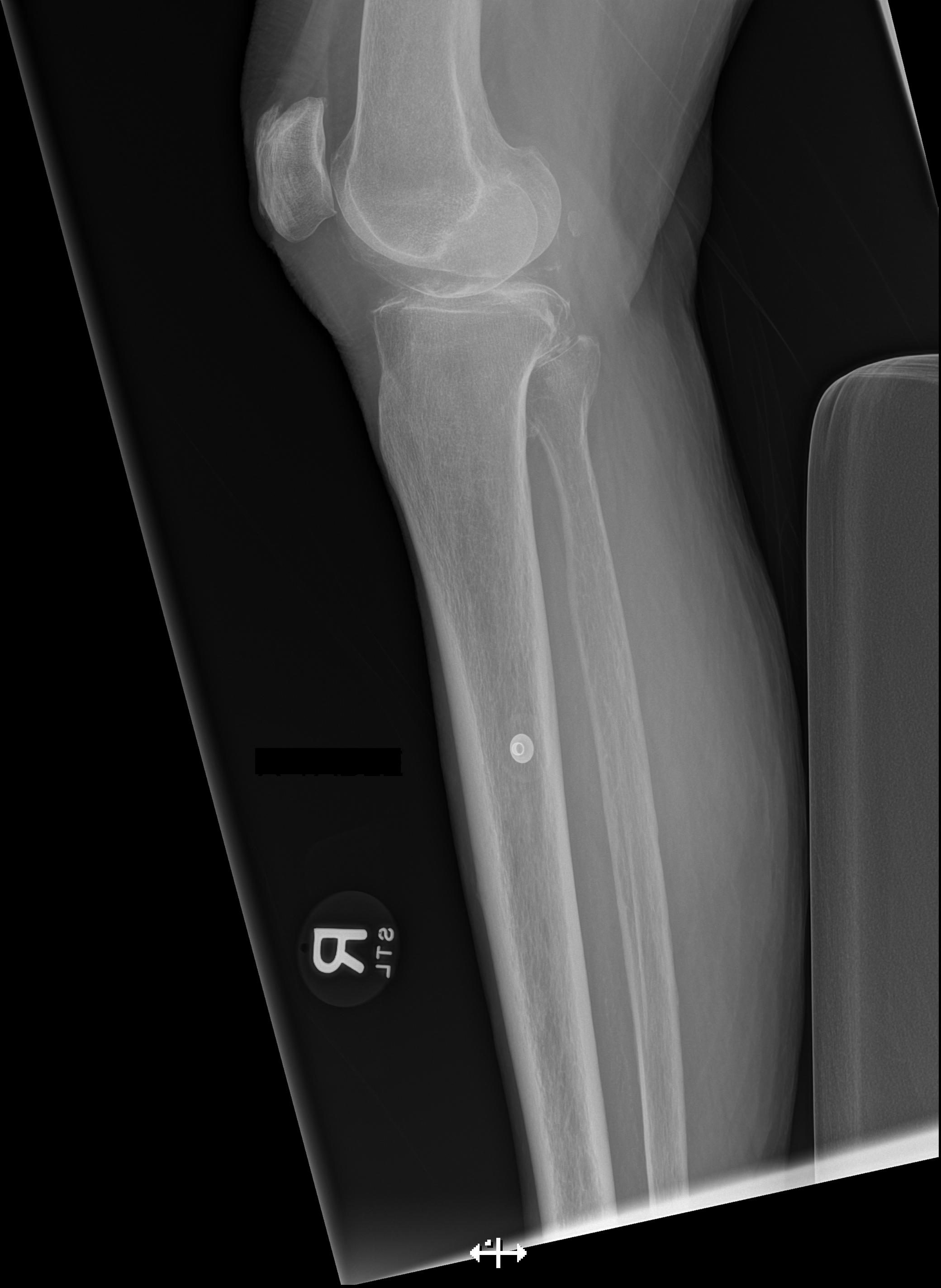

[4 of 4 positions shown; findings below may reference images not displayed]

FINDINGS: Osseous alignment is normal. No fracture line or displaced fracture
fragment seen. Incidental note of chondrocalcinosis within the
medial and lateral knee compartments indicating underlying CPPD.
IMPRESSION: 1. No acute findings. No osseous fracture or dislocation within the
tibia or fibula.
2. Evidence of CPPD at the right knee. No osteophytes or other signs
of secondary degenerative osteoarthritis.

## 2019-05-23 IMAGING — DX DG CHEST W/ LORDOTIC
1 series · 1 of 1 positions shown · non-contrast
Comparison: Chest radiograph performed 10/30/2016

CLINICAL DATA: Question of pneumothorax on prior chest radiograph.
Follow-up chest radiograph requested.

EXAM:
CHEST - 1 VIEW WITH LORDOTIC VIEW(S)

[chest ap]
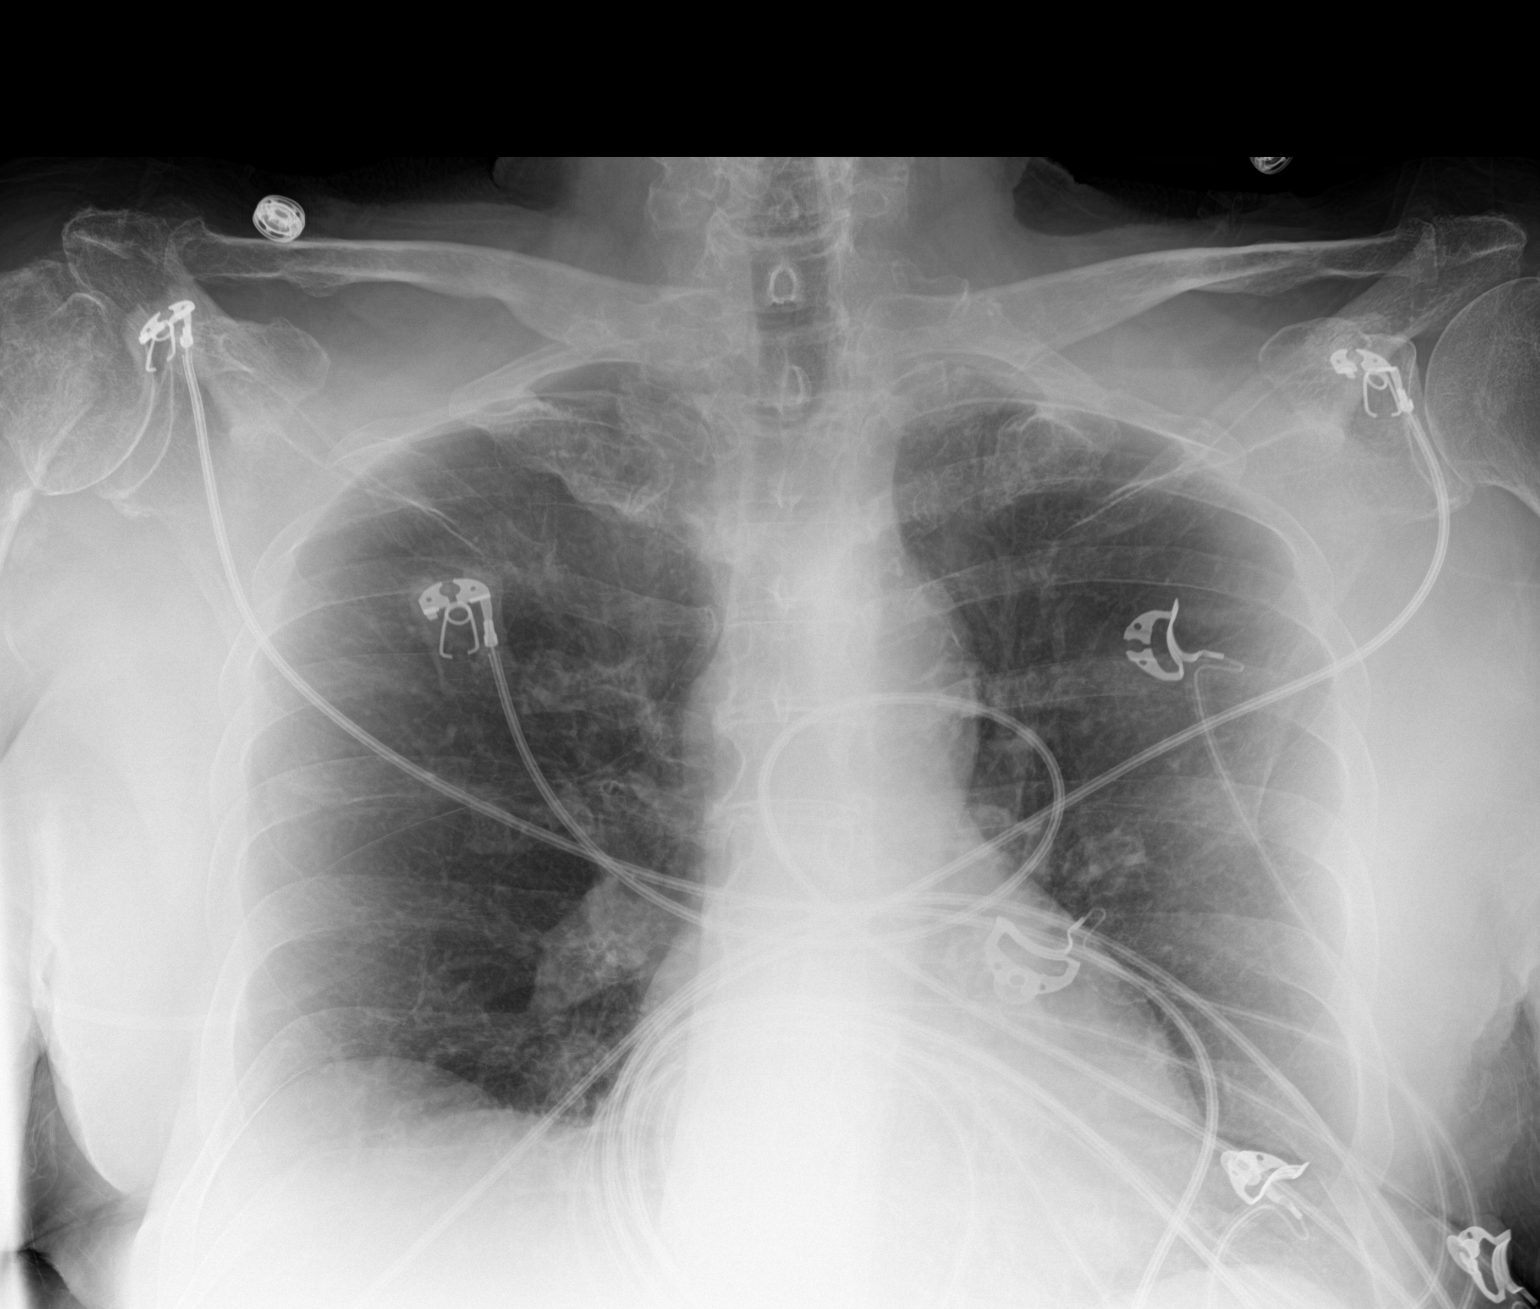

[1 of 1 positions shown; findings below may reference images not displayed]

FINDINGS: The lungs are well-aerated and clear. There is no evidence of focal
opacification, pleural effusion or pneumothorax. The near lucency
overlying the right lung apex is no longer seen and likely reflected
a skin fold.

The cardiomediastinal silhouette is within normal limits. No acute
osseous abnormalities are seen.
IMPRESSION: No acute cardiopulmonary process seen.  No evidence of pneumothorax.

## 2019-05-23 IMAGING — RF DG HIP (WITH PELVIS) OPERATIVE*R*
1 series · 2 of 2 positions shown · non-contrast
Comparison: 10/30/2016

CLINICAL DATA: Right hip fracture

EXAM:
DG C-ARM 61-120 MIN; OPERATIVE RIGHT HIP WITH PELVIS

[Series 1: run · 2 of 2 slices shown]
[im 1/2]
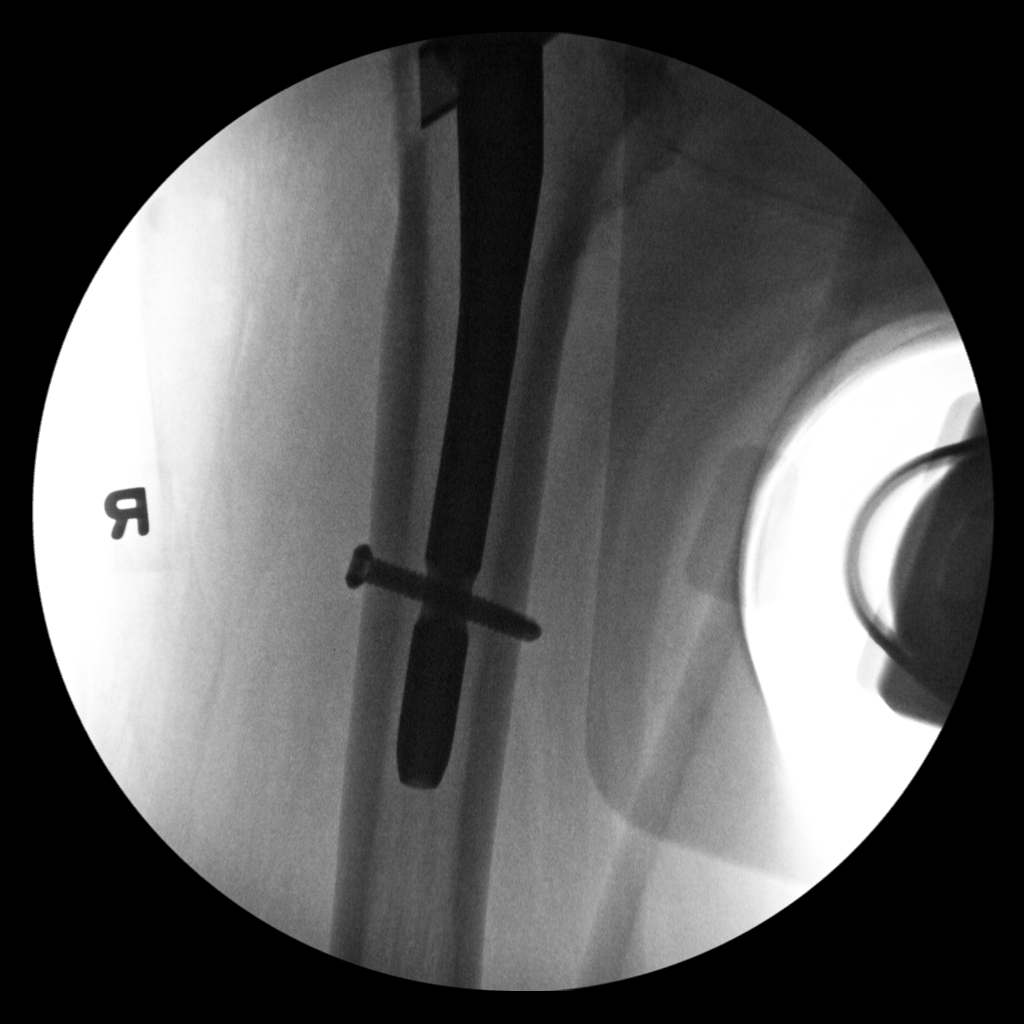
[im 2/2]
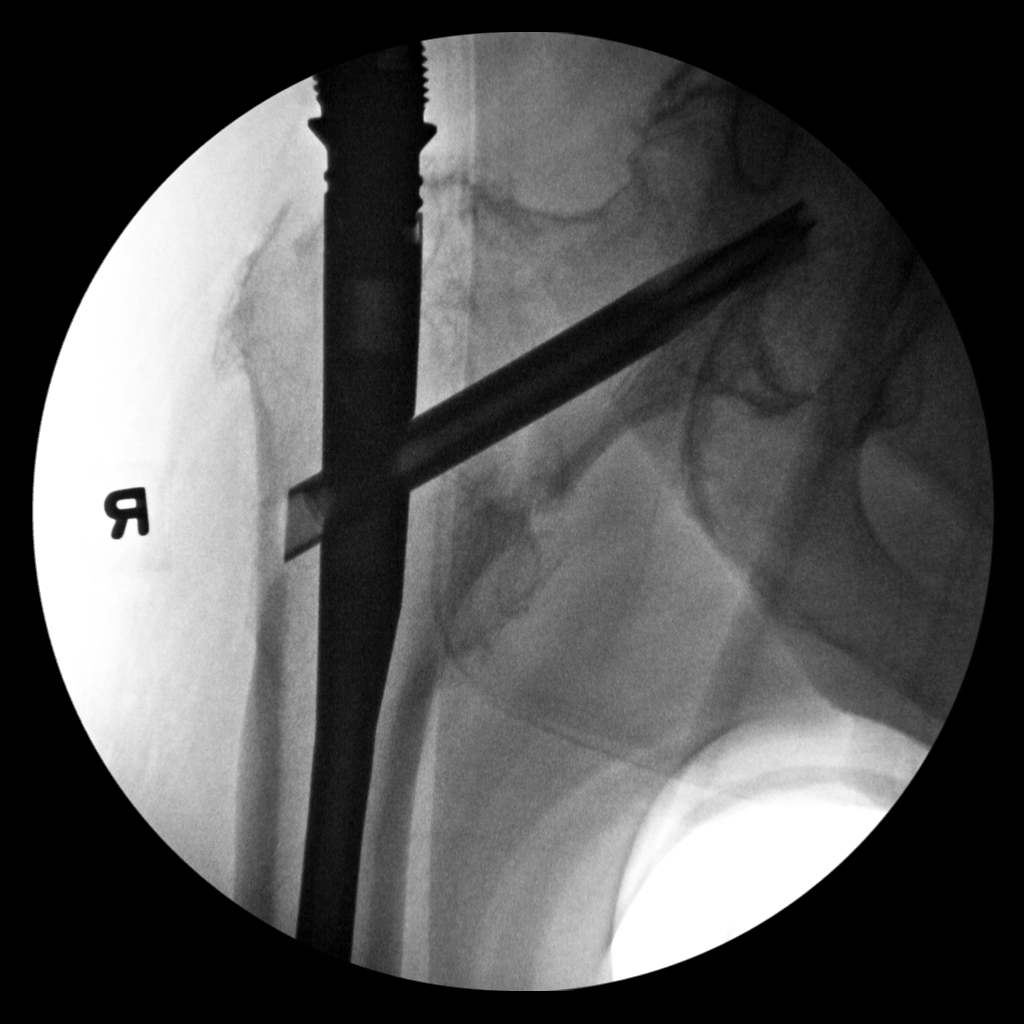

[2 of 2 positions shown; findings below may reference images not displayed]

FLUOROSCOPY TIME:  Fluoroscopy Time:  18 seconds

Radiation Exposure Index (if provided by the fluoroscopic device):
Not available

Number of Acquired Spot Images: 2
FINDINGS: Medullary rod and fixation screw are noted in the proximal right
femur. Fracture fragments are in near anatomic alignment.
IMPRESSION: ORIF of proximal right femoral fracture.

## 2019-07-09 DIAGNOSIS — E1159 Type 2 diabetes mellitus with other circulatory complications: Secondary | ICD-10-CM | POA: Diagnosis not present

## 2019-07-09 DIAGNOSIS — F0391 Unspecified dementia with behavioral disturbance: Secondary | ICD-10-CM | POA: Diagnosis not present

## 2019-07-09 DIAGNOSIS — E782 Mixed hyperlipidemia: Secondary | ICD-10-CM | POA: Diagnosis not present

## 2019-07-09 DIAGNOSIS — I10 Essential (primary) hypertension: Secondary | ICD-10-CM | POA: Diagnosis not present

## 2019-07-16 DIAGNOSIS — B351 Tinea unguium: Secondary | ICD-10-CM | POA: Diagnosis not present

## 2019-07-16 DIAGNOSIS — E0842 Diabetes mellitus due to underlying condition with diabetic polyneuropathy: Secondary | ICD-10-CM | POA: Diagnosis not present

## 2019-07-16 DIAGNOSIS — D510 Vitamin B12 deficiency anemia due to intrinsic factor deficiency: Secondary | ICD-10-CM | POA: Diagnosis not present

## 2019-07-16 DIAGNOSIS — E1159 Type 2 diabetes mellitus with other circulatory complications: Secondary | ICD-10-CM | POA: Diagnosis not present

## 2019-07-24 DIAGNOSIS — E1100 Type 2 diabetes mellitus with hyperosmolarity without nonketotic hyperglycemic-hyperosmolar coma (NKHHC): Secondary | ICD-10-CM | POA: Diagnosis not present

## 2019-07-24 DIAGNOSIS — G309 Alzheimer's disease, unspecified: Secondary | ICD-10-CM | POA: Diagnosis not present

## 2019-07-24 DIAGNOSIS — Z79899 Other long term (current) drug therapy: Secondary | ICD-10-CM | POA: Diagnosis not present

## 2019-07-24 DIAGNOSIS — I10 Essential (primary) hypertension: Secondary | ICD-10-CM | POA: Diagnosis not present

## 2019-08-02 DIAGNOSIS — F0391 Unspecified dementia with behavioral disturbance: Secondary | ICD-10-CM | POA: Diagnosis not present

## 2019-08-02 DIAGNOSIS — E538 Deficiency of other specified B group vitamins: Secondary | ICD-10-CM | POA: Diagnosis not present

## 2019-08-02 DIAGNOSIS — I10 Essential (primary) hypertension: Secondary | ICD-10-CM | POA: Diagnosis not present

## 2019-08-02 DIAGNOSIS — E782 Mixed hyperlipidemia: Secondary | ICD-10-CM | POA: Diagnosis not present

## 2019-08-09 DIAGNOSIS — E538 Deficiency of other specified B group vitamins: Secondary | ICD-10-CM | POA: Diagnosis not present

## 2019-08-09 DIAGNOSIS — E782 Mixed hyperlipidemia: Secondary | ICD-10-CM | POA: Diagnosis not present

## 2019-08-09 DIAGNOSIS — F0391 Unspecified dementia with behavioral disturbance: Secondary | ICD-10-CM | POA: Diagnosis not present

## 2019-08-09 DIAGNOSIS — I10 Essential (primary) hypertension: Secondary | ICD-10-CM | POA: Diagnosis not present

## 2019-09-04 DIAGNOSIS — F419 Anxiety disorder, unspecified: Secondary | ICD-10-CM | POA: Diagnosis not present

## 2019-09-04 DIAGNOSIS — F3342 Major depressive disorder, recurrent, in full remission: Secondary | ICD-10-CM | POA: Diagnosis not present

## 2019-09-04 DIAGNOSIS — F0281 Dementia in other diseases classified elsewhere with behavioral disturbance: Secondary | ICD-10-CM | POA: Diagnosis not present

## 2019-09-06 DIAGNOSIS — I10 Essential (primary) hypertension: Secondary | ICD-10-CM | POA: Diagnosis not present

## 2019-09-06 DIAGNOSIS — D510 Vitamin B12 deficiency anemia due to intrinsic factor deficiency: Secondary | ICD-10-CM | POA: Diagnosis not present

## 2019-09-06 DIAGNOSIS — E782 Mixed hyperlipidemia: Secondary | ICD-10-CM | POA: Diagnosis not present

## 2019-09-06 DIAGNOSIS — F0391 Unspecified dementia with behavioral disturbance: Secondary | ICD-10-CM | POA: Diagnosis not present

## 2019-09-19 DIAGNOSIS — L603 Nail dystrophy: Secondary | ICD-10-CM | POA: Diagnosis not present

## 2019-09-19 DIAGNOSIS — E1151 Type 2 diabetes mellitus with diabetic peripheral angiopathy without gangrene: Secondary | ICD-10-CM | POA: Diagnosis not present

## 2019-09-19 DIAGNOSIS — D510 Vitamin B12 deficiency anemia due to intrinsic factor deficiency: Secondary | ICD-10-CM | POA: Diagnosis not present

## 2019-09-19 DIAGNOSIS — B351 Tinea unguium: Secondary | ICD-10-CM | POA: Diagnosis not present

## 2019-10-08 DIAGNOSIS — F0391 Unspecified dementia with behavioral disturbance: Secondary | ICD-10-CM | POA: Diagnosis not present

## 2019-10-08 DIAGNOSIS — E785 Hyperlipidemia, unspecified: Secondary | ICD-10-CM | POA: Diagnosis not present

## 2019-10-08 DIAGNOSIS — I10 Essential (primary) hypertension: Secondary | ICD-10-CM | POA: Diagnosis not present

## 2019-10-08 DIAGNOSIS — D510 Vitamin B12 deficiency anemia due to intrinsic factor deficiency: Secondary | ICD-10-CM | POA: Diagnosis not present

## 2019-10-21 IMAGING — DX DG CHEST 1V PORT
2 series · 2 of 2 positions shown · non-contrast
Comparison: 11/21/2016.  12/07/2016.

CLINICAL DATA: Preop chest exam for hip arthroplasty due to
fracture. History of breast cancer.

EXAM:
PORTABLE CHEST 1 VIEW

[chest ap (1 of 2)]
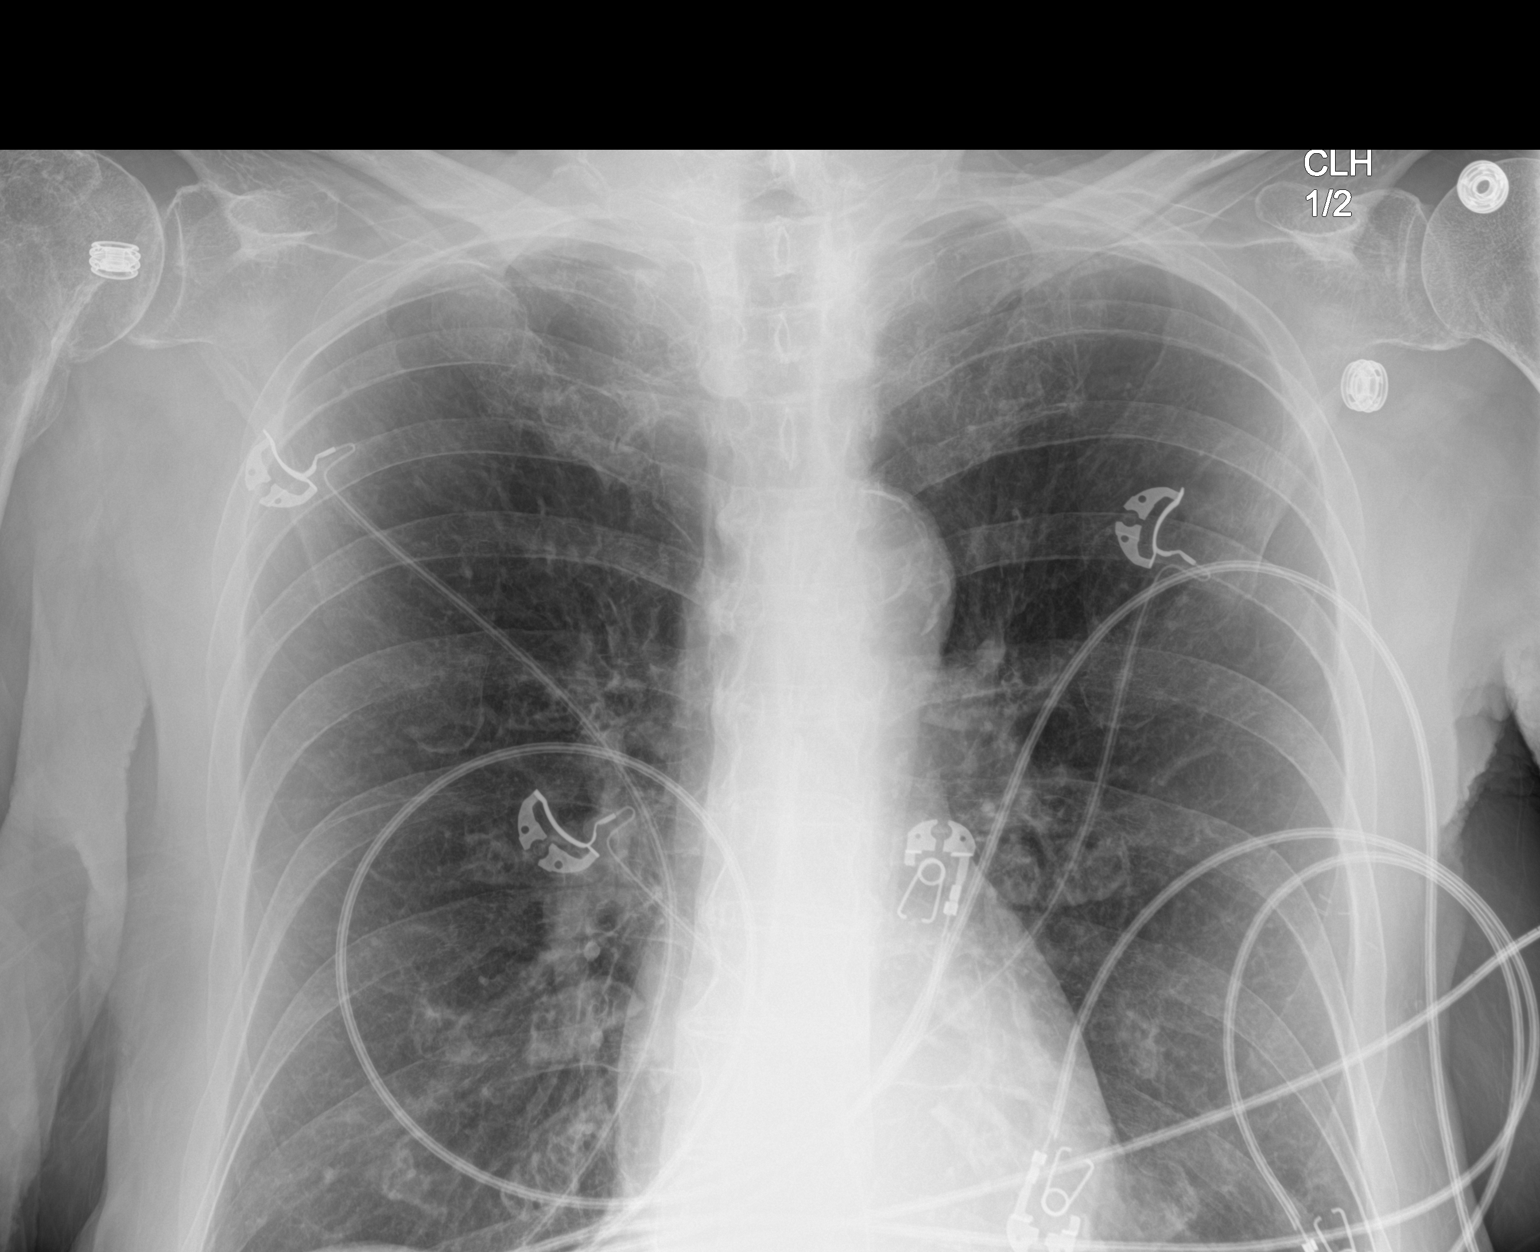

[chest ap (2 of 2)]
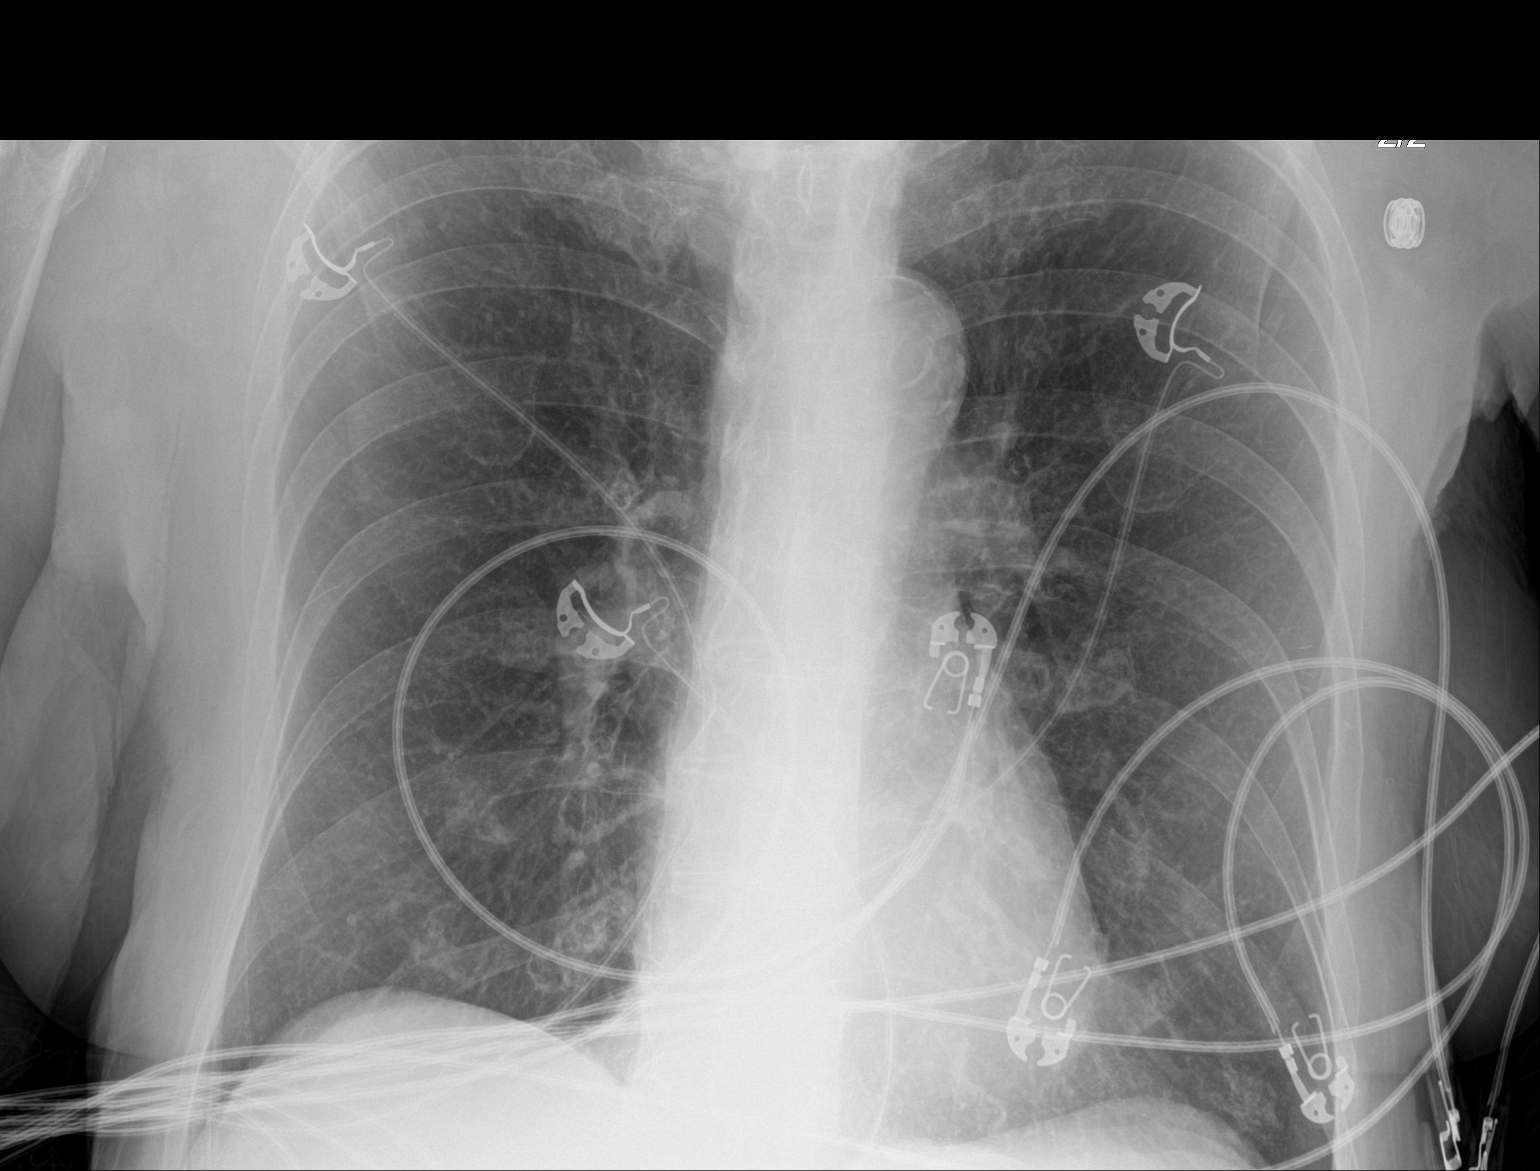

[2 of 2 positions shown; findings below may reference images not displayed]

FINDINGS: The heart size and mediastinal contours are within normal limits.
Both lungs are clear. The visualized skeletal structures are
unremarkable. Aortic atherosclerosis.
IMPRESSION: No active disease.  Stable exam.

## 2019-10-26 DIAGNOSIS — F419 Anxiety disorder, unspecified: Secondary | ICD-10-CM | POA: Diagnosis not present

## 2019-10-26 DIAGNOSIS — F0281 Dementia in other diseases classified elsewhere with behavioral disturbance: Secondary | ICD-10-CM | POA: Diagnosis not present

## 2019-10-26 DIAGNOSIS — F3342 Major depressive disorder, recurrent, in full remission: Secondary | ICD-10-CM | POA: Diagnosis not present

## 2019-11-07 DIAGNOSIS — I10 Essential (primary) hypertension: Secondary | ICD-10-CM | POA: Diagnosis not present

## 2019-11-07 DIAGNOSIS — F0391 Unspecified dementia with behavioral disturbance: Secondary | ICD-10-CM | POA: Diagnosis not present

## 2019-11-07 DIAGNOSIS — I739 Peripheral vascular disease, unspecified: Secondary | ICD-10-CM | POA: Diagnosis not present

## 2019-11-07 DIAGNOSIS — E782 Mixed hyperlipidemia: Secondary | ICD-10-CM | POA: Diagnosis not present

## 2019-11-29 DIAGNOSIS — F3342 Major depressive disorder, recurrent, in full remission: Secondary | ICD-10-CM | POA: Diagnosis not present

## 2019-11-29 DIAGNOSIS — F0281 Dementia in other diseases classified elsewhere with behavioral disturbance: Secondary | ICD-10-CM | POA: Diagnosis not present

## 2019-11-29 DIAGNOSIS — F419 Anxiety disorder, unspecified: Secondary | ICD-10-CM | POA: Diagnosis not present

## 2019-12-05 DIAGNOSIS — I739 Peripheral vascular disease, unspecified: Secondary | ICD-10-CM | POA: Diagnosis not present

## 2019-12-05 DIAGNOSIS — I10 Essential (primary) hypertension: Secondary | ICD-10-CM | POA: Diagnosis not present

## 2019-12-05 DIAGNOSIS — F0391 Unspecified dementia with behavioral disturbance: Secondary | ICD-10-CM | POA: Diagnosis not present

## 2019-12-31 DIAGNOSIS — F0281 Dementia in other diseases classified elsewhere with behavioral disturbance: Secondary | ICD-10-CM | POA: Diagnosis not present

## 2019-12-31 DIAGNOSIS — F419 Anxiety disorder, unspecified: Secondary | ICD-10-CM | POA: Diagnosis not present

## 2019-12-31 DIAGNOSIS — F3342 Major depressive disorder, recurrent, in full remission: Secondary | ICD-10-CM | POA: Diagnosis not present

## 2020-01-02 DIAGNOSIS — I739 Peripheral vascular disease, unspecified: Secondary | ICD-10-CM | POA: Diagnosis not present

## 2020-01-02 DIAGNOSIS — I10 Essential (primary) hypertension: Secondary | ICD-10-CM | POA: Diagnosis not present

## 2020-01-02 DIAGNOSIS — F0391 Unspecified dementia with behavioral disturbance: Secondary | ICD-10-CM | POA: Diagnosis not present

## 2020-01-24 DIAGNOSIS — R52 Pain, unspecified: Secondary | ICD-10-CM | POA: Diagnosis not present

## 2020-01-24 DIAGNOSIS — I959 Hypotension, unspecified: Secondary | ICD-10-CM | POA: Diagnosis not present

## 2020-01-24 DIAGNOSIS — R531 Weakness: Secondary | ICD-10-CM | POA: Diagnosis not present

## 2020-01-30 DIAGNOSIS — F0281 Dementia in other diseases classified elsewhere with behavioral disturbance: Secondary | ICD-10-CM | POA: Diagnosis not present

## 2020-01-30 DIAGNOSIS — W19XXXA Unspecified fall, initial encounter: Secondary | ICD-10-CM | POA: Diagnosis not present

## 2020-01-30 DIAGNOSIS — F3342 Major depressive disorder, recurrent, in full remission: Secondary | ICD-10-CM | POA: Diagnosis not present

## 2020-01-30 DIAGNOSIS — F419 Anxiety disorder, unspecified: Secondary | ICD-10-CM | POA: Diagnosis not present

## 2020-01-30 DIAGNOSIS — I10 Essential (primary) hypertension: Secondary | ICD-10-CM | POA: Diagnosis not present

## 2020-02-07 DIAGNOSIS — F0281 Dementia in other diseases classified elsewhere with behavioral disturbance: Secondary | ICD-10-CM | POA: Diagnosis not present

## 2020-02-07 DIAGNOSIS — I10 Essential (primary) hypertension: Secondary | ICD-10-CM | POA: Diagnosis not present

## 2020-02-20 DIAGNOSIS — I739 Peripheral vascular disease, unspecified: Secondary | ICD-10-CM | POA: Diagnosis not present

## 2020-02-20 DIAGNOSIS — F0391 Unspecified dementia with behavioral disturbance: Secondary | ICD-10-CM | POA: Diagnosis not present

## 2020-02-20 DIAGNOSIS — I10 Essential (primary) hypertension: Secondary | ICD-10-CM | POA: Diagnosis not present

## 2020-02-27 DIAGNOSIS — I10 Essential (primary) hypertension: Secondary | ICD-10-CM | POA: Diagnosis not present

## 2020-02-27 DIAGNOSIS — I739 Peripheral vascular disease, unspecified: Secondary | ICD-10-CM | POA: Diagnosis not present

## 2020-02-27 DIAGNOSIS — F0391 Unspecified dementia with behavioral disturbance: Secondary | ICD-10-CM | POA: Diagnosis not present

## 2020-02-27 DIAGNOSIS — Z0000 Encounter for general adult medical examination without abnormal findings: Secondary | ICD-10-CM | POA: Diagnosis not present

## 2020-03-06 DIAGNOSIS — Q845 Enlarged and hypertrophic nails: Secondary | ICD-10-CM | POA: Diagnosis not present

## 2020-03-06 DIAGNOSIS — L603 Nail dystrophy: Secondary | ICD-10-CM | POA: Diagnosis not present

## 2020-03-06 DIAGNOSIS — E1151 Type 2 diabetes mellitus with diabetic peripheral angiopathy without gangrene: Secondary | ICD-10-CM | POA: Diagnosis not present

## 2020-03-19 DIAGNOSIS — F0391 Unspecified dementia with behavioral disturbance: Secondary | ICD-10-CM | POA: Diagnosis not present

## 2020-03-19 DIAGNOSIS — I10 Essential (primary) hypertension: Secondary | ICD-10-CM | POA: Diagnosis not present

## 2020-03-19 DIAGNOSIS — I739 Peripheral vascular disease, unspecified: Secondary | ICD-10-CM | POA: Diagnosis not present

## 2020-04-16 DIAGNOSIS — I10 Essential (primary) hypertension: Secondary | ICD-10-CM | POA: Diagnosis not present

## 2020-04-16 DIAGNOSIS — I739 Peripheral vascular disease, unspecified: Secondary | ICD-10-CM | POA: Diagnosis not present

## 2020-04-16 DIAGNOSIS — F339 Major depressive disorder, recurrent, unspecified: Secondary | ICD-10-CM | POA: Diagnosis not present

## 2020-04-16 DIAGNOSIS — F0391 Unspecified dementia with behavioral disturbance: Secondary | ICD-10-CM | POA: Diagnosis not present

## 2020-05-08 DIAGNOSIS — F339 Major depressive disorder, recurrent, unspecified: Secondary | ICD-10-CM | POA: Diagnosis not present

## 2020-05-08 DIAGNOSIS — F0391 Unspecified dementia with behavioral disturbance: Secondary | ICD-10-CM | POA: Diagnosis not present

## 2020-05-21 DIAGNOSIS — I10 Essential (primary) hypertension: Secondary | ICD-10-CM | POA: Diagnosis not present

## 2020-05-21 DIAGNOSIS — I739 Peripheral vascular disease, unspecified: Secondary | ICD-10-CM | POA: Diagnosis not present

## 2020-05-21 DIAGNOSIS — F0391 Unspecified dementia with behavioral disturbance: Secondary | ICD-10-CM | POA: Diagnosis not present

## 2020-05-21 DIAGNOSIS — Z79899 Other long term (current) drug therapy: Secondary | ICD-10-CM | POA: Diagnosis not present

## 2020-06-18 DIAGNOSIS — E785 Hyperlipidemia, unspecified: Secondary | ICD-10-CM | POA: Diagnosis not present

## 2020-06-18 DIAGNOSIS — E559 Vitamin D deficiency, unspecified: Secondary | ICD-10-CM | POA: Diagnosis not present

## 2020-06-18 DIAGNOSIS — D519 Vitamin B12 deficiency anemia, unspecified: Secondary | ICD-10-CM | POA: Diagnosis not present

## 2020-07-16 DIAGNOSIS — I739 Peripheral vascular disease, unspecified: Secondary | ICD-10-CM | POA: Diagnosis not present

## 2020-07-16 DIAGNOSIS — I10 Essential (primary) hypertension: Secondary | ICD-10-CM | POA: Diagnosis not present

## 2020-07-16 DIAGNOSIS — F0391 Unspecified dementia with behavioral disturbance: Secondary | ICD-10-CM | POA: Diagnosis not present

## 2020-08-13 DIAGNOSIS — I739 Peripheral vascular disease, unspecified: Secondary | ICD-10-CM | POA: Diagnosis not present

## 2020-08-13 DIAGNOSIS — F0391 Unspecified dementia with behavioral disturbance: Secondary | ICD-10-CM | POA: Diagnosis not present

## 2020-08-13 DIAGNOSIS — I10 Essential (primary) hypertension: Secondary | ICD-10-CM | POA: Diagnosis not present

## 2020-09-10 DIAGNOSIS — I739 Peripheral vascular disease, unspecified: Secondary | ICD-10-CM | POA: Diagnosis not present

## 2020-09-10 DIAGNOSIS — Z79899 Other long term (current) drug therapy: Secondary | ICD-10-CM | POA: Diagnosis not present

## 2020-09-10 DIAGNOSIS — F0391 Unspecified dementia with behavioral disturbance: Secondary | ICD-10-CM | POA: Diagnosis not present

## 2020-09-10 DIAGNOSIS — I10 Essential (primary) hypertension: Secondary | ICD-10-CM | POA: Diagnosis not present

## 2020-09-27 DIAGNOSIS — R251 Tremor, unspecified: Secondary | ICD-10-CM | POA: Diagnosis not present

## 2020-09-27 DIAGNOSIS — R4182 Altered mental status, unspecified: Secondary | ICD-10-CM | POA: Diagnosis not present

## 2020-09-27 DIAGNOSIS — F0390 Unspecified dementia without behavioral disturbance: Secondary | ICD-10-CM | POA: Diagnosis not present

## 2020-09-27 DIAGNOSIS — R404 Transient alteration of awareness: Secondary | ICD-10-CM | POA: Diagnosis not present

## 2020-09-27 DIAGNOSIS — I491 Atrial premature depolarization: Secondary | ICD-10-CM | POA: Diagnosis not present

## 2020-09-27 DIAGNOSIS — N3001 Acute cystitis with hematuria: Secondary | ICD-10-CM | POA: Diagnosis not present

## 2020-09-27 DIAGNOSIS — Z79899 Other long term (current) drug therapy: Secondary | ICD-10-CM | POA: Diagnosis not present

## 2020-11-03 DIAGNOSIS — E1159 Type 2 diabetes mellitus with other circulatory complications: Secondary | ICD-10-CM | POA: Diagnosis not present

## 2020-11-03 DIAGNOSIS — E782 Mixed hyperlipidemia: Secondary | ICD-10-CM | POA: Diagnosis not present

## 2020-11-03 DIAGNOSIS — F339 Major depressive disorder, recurrent, unspecified: Secondary | ICD-10-CM | POA: Diagnosis not present

## 2020-11-03 DIAGNOSIS — I10 Essential (primary) hypertension: Secondary | ICD-10-CM | POA: Diagnosis not present

## 2020-11-09 DIAGNOSIS — S199XXA Unspecified injury of neck, initial encounter: Secondary | ICD-10-CM | POA: Diagnosis not present

## 2020-11-09 DIAGNOSIS — G4730 Sleep apnea, unspecified: Secondary | ICD-10-CM | POA: Diagnosis not present

## 2020-11-09 DIAGNOSIS — F0390 Unspecified dementia without behavioral disturbance: Secondary | ICD-10-CM | POA: Diagnosis not present

## 2020-11-09 DIAGNOSIS — G309 Alzheimer's disease, unspecified: Secondary | ICD-10-CM | POA: Diagnosis not present

## 2020-11-09 DIAGNOSIS — I6529 Occlusion and stenosis of unspecified carotid artery: Secondary | ICD-10-CM | POA: Diagnosis not present

## 2020-11-09 DIAGNOSIS — F0280 Dementia in other diseases classified elsewhere without behavioral disturbance: Secondary | ICD-10-CM | POA: Diagnosis not present

## 2020-11-09 DIAGNOSIS — R0902 Hypoxemia: Secondary | ICD-10-CM | POA: Diagnosis not present

## 2020-11-09 DIAGNOSIS — I10 Essential (primary) hypertension: Secondary | ICD-10-CM | POA: Diagnosis not present

## 2020-11-09 DIAGNOSIS — I2510 Atherosclerotic heart disease of native coronary artery without angina pectoris: Secondary | ICD-10-CM | POA: Diagnosis not present

## 2020-11-09 DIAGNOSIS — S0990XA Unspecified injury of head, initial encounter: Secondary | ICD-10-CM | POA: Diagnosis not present

## 2020-11-09 DIAGNOSIS — Z23 Encounter for immunization: Secondary | ICD-10-CM | POA: Diagnosis not present

## 2020-11-09 DIAGNOSIS — M47812 Spondylosis without myelopathy or radiculopathy, cervical region: Secondary | ICD-10-CM | POA: Diagnosis not present

## 2020-11-09 DIAGNOSIS — S0181XA Laceration without foreign body of other part of head, initial encounter: Secondary | ICD-10-CM | POA: Diagnosis not present

## 2020-11-09 DIAGNOSIS — R404 Transient alteration of awareness: Secondary | ICD-10-CM | POA: Diagnosis not present

## 2020-11-09 DIAGNOSIS — R4182 Altered mental status, unspecified: Secondary | ICD-10-CM | POA: Diagnosis not present

## 2020-11-09 DIAGNOSIS — Z79899 Other long term (current) drug therapy: Secondary | ICD-10-CM | POA: Diagnosis not present

## 2020-11-09 DIAGNOSIS — M8580 Other specified disorders of bone density and structure, unspecified site: Secondary | ICD-10-CM | POA: Diagnosis not present

## 2020-11-10 DIAGNOSIS — S0181XD Laceration without foreign body of other part of head, subsequent encounter: Secondary | ICD-10-CM | POA: Diagnosis not present

## 2020-11-10 DIAGNOSIS — G9340 Encephalopathy, unspecified: Secondary | ICD-10-CM | POA: Diagnosis not present

## 2020-11-10 DIAGNOSIS — R479 Unspecified speech disturbances: Secondary | ICD-10-CM | POA: Diagnosis not present

## 2020-11-10 DIAGNOSIS — G301 Alzheimer's disease with late onset: Secondary | ICD-10-CM | POA: Diagnosis not present

## 2020-11-10 DIAGNOSIS — Z79899 Other long term (current) drug therapy: Secondary | ICD-10-CM | POA: Diagnosis not present

## 2020-11-10 DIAGNOSIS — B9620 Unspecified Escherichia coli [E. coli] as the cause of diseases classified elsewhere: Secondary | ICD-10-CM | POA: Diagnosis not present

## 2020-11-10 DIAGNOSIS — S0003XA Contusion of scalp, initial encounter: Secondary | ICD-10-CM | POA: Diagnosis not present

## 2020-11-10 DIAGNOSIS — N3001 Acute cystitis with hematuria: Secondary | ICD-10-CM | POA: Diagnosis not present

## 2020-11-10 DIAGNOSIS — Z880 Allergy status to penicillin: Secondary | ICD-10-CM | POA: Diagnosis not present

## 2020-11-10 DIAGNOSIS — Z66 Do not resuscitate: Secondary | ICD-10-CM | POA: Diagnosis not present

## 2020-11-10 DIAGNOSIS — R269 Unspecified abnormalities of gait and mobility: Secondary | ICD-10-CM | POA: Diagnosis not present

## 2020-11-10 DIAGNOSIS — R4182 Altered mental status, unspecified: Secondary | ICD-10-CM | POA: Diagnosis not present

## 2020-11-10 DIAGNOSIS — I959 Hypotension, unspecified: Secondary | ICD-10-CM | POA: Diagnosis not present

## 2020-11-10 DIAGNOSIS — E785 Hyperlipidemia, unspecified: Secondary | ICD-10-CM | POA: Diagnosis not present

## 2020-11-10 DIAGNOSIS — G9389 Other specified disorders of brain: Secondary | ICD-10-CM | POA: Diagnosis not present

## 2020-11-10 DIAGNOSIS — I6529 Occlusion and stenosis of unspecified carotid artery: Secondary | ICD-10-CM | POA: Diagnosis not present

## 2020-11-10 DIAGNOSIS — R29715 NIHSS score 15: Secondary | ICD-10-CM | POA: Diagnosis not present

## 2020-11-10 DIAGNOSIS — I10 Essential (primary) hypertension: Secondary | ICD-10-CM | POA: Diagnosis not present

## 2020-11-10 DIAGNOSIS — G4730 Sleep apnea, unspecified: Secondary | ICD-10-CM | POA: Diagnosis not present

## 2020-11-10 DIAGNOSIS — F32A Depression, unspecified: Secondary | ICD-10-CM | POA: Diagnosis not present

## 2020-11-10 DIAGNOSIS — S0083XA Contusion of other part of head, initial encounter: Secondary | ICD-10-CM | POA: Diagnosis not present

## 2020-11-10 DIAGNOSIS — S0990XA Unspecified injury of head, initial encounter: Secondary | ICD-10-CM | POA: Diagnosis not present

## 2020-11-10 DIAGNOSIS — Z20822 Contact with and (suspected) exposure to covid-19: Secondary | ICD-10-CM | POA: Diagnosis not present

## 2020-11-10 DIAGNOSIS — I499 Cardiac arrhythmia, unspecified: Secondary | ICD-10-CM | POA: Diagnosis not present

## 2020-11-10 DIAGNOSIS — R0902 Hypoxemia: Secondary | ICD-10-CM | POA: Diagnosis not present

## 2020-11-10 DIAGNOSIS — R451 Restlessness and agitation: Secondary | ICD-10-CM | POA: Diagnosis not present

## 2020-11-11 DIAGNOSIS — R4781 Slurred speech: Secondary | ICD-10-CM | POA: Diagnosis not present

## 2020-11-11 DIAGNOSIS — R29810 Facial weakness: Secondary | ICD-10-CM | POA: Diagnosis not present

## 2020-11-11 DIAGNOSIS — F0390 Unspecified dementia without behavioral disturbance: Secondary | ICD-10-CM | POA: Diagnosis not present

## 2020-11-11 DIAGNOSIS — N390 Urinary tract infection, site not specified: Secondary | ICD-10-CM | POA: Diagnosis not present

## 2020-11-12 DIAGNOSIS — F0280 Dementia in other diseases classified elsewhere without behavioral disturbance: Secondary | ICD-10-CM | POA: Diagnosis not present

## 2020-11-12 DIAGNOSIS — R8271 Bacteriuria: Secondary | ICD-10-CM | POA: Diagnosis not present

## 2020-11-12 DIAGNOSIS — G9340 Encephalopathy, unspecified: Secondary | ICD-10-CM | POA: Diagnosis not present

## 2020-11-12 DIAGNOSIS — G309 Alzheimer's disease, unspecified: Secondary | ICD-10-CM | POA: Diagnosis not present

## 2020-11-13 DIAGNOSIS — R410 Disorientation, unspecified: Secondary | ICD-10-CM | POA: Diagnosis not present

## 2020-11-13 DIAGNOSIS — R8271 Bacteriuria: Secondary | ICD-10-CM | POA: Diagnosis not present

## 2020-11-13 DIAGNOSIS — G309 Alzheimer's disease, unspecified: Secondary | ICD-10-CM | POA: Diagnosis not present

## 2020-11-13 DIAGNOSIS — F0280 Dementia in other diseases classified elsewhere without behavioral disturbance: Secondary | ICD-10-CM | POA: Diagnosis not present

## 2020-11-14 DIAGNOSIS — B9620 Unspecified Escherichia coli [E. coli] as the cause of diseases classified elsewhere: Secondary | ICD-10-CM | POA: Diagnosis not present

## 2020-11-14 DIAGNOSIS — R410 Disorientation, unspecified: Secondary | ICD-10-CM | POA: Diagnosis not present

## 2020-11-14 DIAGNOSIS — R5381 Other malaise: Secondary | ICD-10-CM | POA: Diagnosis not present

## 2020-11-14 DIAGNOSIS — G309 Alzheimer's disease, unspecified: Secondary | ICD-10-CM | POA: Diagnosis not present

## 2020-11-14 DIAGNOSIS — R8271 Bacteriuria: Secondary | ICD-10-CM | POA: Diagnosis not present

## 2020-11-14 DIAGNOSIS — R4182 Altered mental status, unspecified: Secondary | ICD-10-CM | POA: Diagnosis not present

## 2020-11-14 DIAGNOSIS — R279 Unspecified lack of coordination: Secondary | ICD-10-CM | POA: Diagnosis not present

## 2020-11-14 DIAGNOSIS — M6281 Muscle weakness (generalized): Secondary | ICD-10-CM | POA: Diagnosis not present

## 2020-11-18 DIAGNOSIS — I10 Essential (primary) hypertension: Secondary | ICD-10-CM | POA: Diagnosis not present

## 2020-11-18 DIAGNOSIS — F0391 Unspecified dementia with behavioral disturbance: Secondary | ICD-10-CM | POA: Diagnosis not present

## 2020-11-18 DIAGNOSIS — G9340 Encephalopathy, unspecified: Secondary | ICD-10-CM | POA: Diagnosis not present

## 2020-11-21 DIAGNOSIS — I10 Essential (primary) hypertension: Secondary | ICD-10-CM | POA: Diagnosis not present

## 2020-11-21 DIAGNOSIS — E1159 Type 2 diabetes mellitus with other circulatory complications: Secondary | ICD-10-CM | POA: Diagnosis not present

## 2020-11-21 DIAGNOSIS — Z79899 Other long term (current) drug therapy: Secondary | ICD-10-CM | POA: Diagnosis not present

## 2022-04-07 DEATH — deceased
